# Patient Record
Sex: Female | Born: 1935
Health system: Southern US, Community
[De-identification: ages and names within clinical notes are randomized; demographics above are authoritative.]

## PROBLEM LIST (undated history)

## (undated) DIAGNOSIS — M810 Age-related osteoporosis without current pathological fracture: Secondary | ICD-10-CM

## (undated) DIAGNOSIS — M1990 Unspecified osteoarthritis, unspecified site: Secondary | ICD-10-CM

## (undated) DIAGNOSIS — F32A Depression, unspecified: Secondary | ICD-10-CM

## (undated) DIAGNOSIS — I5041 Acute combined systolic (congestive) and diastolic (congestive) heart failure: Secondary | ICD-10-CM

## (undated) DIAGNOSIS — S42401A Unspecified fracture of lower end of right humerus, initial encounter for closed fracture: Secondary | ICD-10-CM

## (undated) DIAGNOSIS — I10 Essential (primary) hypertension: Secondary | ICD-10-CM

## (undated) DIAGNOSIS — F419 Anxiety disorder, unspecified: Secondary | ICD-10-CM

## (undated) DIAGNOSIS — J449 Chronic obstructive pulmonary disease, unspecified: Secondary | ICD-10-CM

## (undated) DIAGNOSIS — Z973 Presence of spectacles and contact lenses: Secondary | ICD-10-CM

## (undated) DIAGNOSIS — Z923 Personal history of irradiation: Secondary | ICD-10-CM

## (undated) DIAGNOSIS — E785 Hyperlipidemia, unspecified: Secondary | ICD-10-CM

## (undated) DIAGNOSIS — F329 Major depressive disorder, single episode, unspecified: Secondary | ICD-10-CM

## (undated) DIAGNOSIS — C801 Malignant (primary) neoplasm, unspecified: Secondary | ICD-10-CM

## (undated) DIAGNOSIS — J189 Pneumonia, unspecified organism: Secondary | ICD-10-CM

## (undated) HISTORY — PX: HERNIA REPAIR: SHX51

## (undated) HISTORY — DX: Malignant (primary) neoplasm, unspecified: C80.1

## (undated) HISTORY — PX: ABDOMINAL HYSTERECTOMY: SHX81

## (undated) HISTORY — PX: CHOLECYSTECTOMY: SHX55

## (undated) HISTORY — PX: APPENDECTOMY: SHX54

## (undated) HISTORY — DX: Hyperlipidemia, unspecified: E78.5

## (undated) HISTORY — PX: OTHER SURGICAL HISTORY: SHX169

## (undated) HISTORY — DX: Chronic obstructive pulmonary disease, unspecified: J44.9

## (undated) HISTORY — DX: Age-related osteoporosis without current pathological fracture: M81.0

## (undated) HISTORY — PX: JOINT REPLACEMENT: SHX530

## (undated) HISTORY — PX: EYE SURGERY: SHX253

---

## 1998-04-09 ENCOUNTER — Other Ambulatory Visit: Admission: RE | Admit: 1998-04-09 | Discharge: 1998-04-09 | Payer: Self-pay | Admitting: Obstetrics and Gynecology

## 2000-04-12 ENCOUNTER — Other Ambulatory Visit: Admission: RE | Admit: 2000-04-12 | Discharge: 2000-04-12 | Payer: Self-pay | Admitting: Obstetrics and Gynecology

## 2002-02-24 ENCOUNTER — Encounter: Payer: Self-pay | Admitting: Obstetrics and Gynecology

## 2002-02-24 ENCOUNTER — Encounter: Admission: RE | Admit: 2002-02-24 | Discharge: 2002-02-24 | Payer: Self-pay | Admitting: Obstetrics and Gynecology

## 2003-01-27 ENCOUNTER — Ambulatory Visit (HOSPITAL_COMMUNITY): Admission: RE | Admit: 2003-01-27 | Discharge: 2003-01-27 | Payer: Self-pay | Admitting: Gastroenterology

## 2003-07-30 ENCOUNTER — Ambulatory Visit (HOSPITAL_COMMUNITY): Admission: RE | Admit: 2003-07-30 | Discharge: 2003-07-30 | Payer: Self-pay | Admitting: Family Medicine

## 2003-07-30 ENCOUNTER — Encounter: Payer: Self-pay | Admitting: Family Medicine

## 2004-09-21 ENCOUNTER — Ambulatory Visit (HOSPITAL_COMMUNITY): Admission: RE | Admit: 2004-09-21 | Discharge: 2004-09-21 | Payer: Self-pay | Admitting: Family Medicine

## 2005-10-02 ENCOUNTER — Ambulatory Visit (HOSPITAL_COMMUNITY): Admission: RE | Admit: 2005-10-02 | Discharge: 2005-10-02 | Payer: Self-pay | Admitting: Family Medicine

## 2006-10-18 ENCOUNTER — Ambulatory Visit (HOSPITAL_COMMUNITY): Admission: RE | Admit: 2006-10-18 | Discharge: 2006-10-18 | Payer: Self-pay | Admitting: Family Medicine

## 2007-10-25 ENCOUNTER — Ambulatory Visit (HOSPITAL_COMMUNITY): Admission: RE | Admit: 2007-10-25 | Discharge: 2007-10-25 | Payer: Self-pay | Admitting: Family Medicine

## 2008-11-12 ENCOUNTER — Ambulatory Visit (HOSPITAL_COMMUNITY): Admission: RE | Admit: 2008-11-12 | Discharge: 2008-11-12 | Payer: Self-pay | Admitting: Family Medicine

## 2009-11-23 ENCOUNTER — Ambulatory Visit (HOSPITAL_COMMUNITY): Admission: RE | Admit: 2009-11-23 | Discharge: 2009-11-23 | Payer: Self-pay | Admitting: Family Medicine

## 2010-07-12 ENCOUNTER — Ambulatory Visit (HOSPITAL_COMMUNITY): Admission: RE | Admit: 2010-07-12 | Discharge: 2010-07-12 | Payer: Self-pay | Admitting: Family Medicine

## 2010-11-21 ENCOUNTER — Other Ambulatory Visit (HOSPITAL_COMMUNITY): Payer: Self-pay | Admitting: Family Medicine

## 2010-11-21 DIAGNOSIS — Z139 Encounter for screening, unspecified: Secondary | ICD-10-CM

## 2010-11-24 ENCOUNTER — Ambulatory Visit (HOSPITAL_COMMUNITY)
Admission: RE | Admit: 2010-11-24 | Discharge: 2010-11-24 | Disposition: A | Discharge status: Home / Self Care | Payer: MEDICARE | Source: Ambulatory Visit | Attending: Family Medicine | Admitting: Family Medicine

## 2010-11-24 DIAGNOSIS — Z1231 Encounter for screening mammogram for malignant neoplasm of breast: Secondary | ICD-10-CM | POA: Insufficient documentation

## 2010-11-24 DIAGNOSIS — Z139 Encounter for screening, unspecified: Secondary | ICD-10-CM

## 2011-01-08 ENCOUNTER — Emergency Department (INDEPENDENT_AMBULATORY_CARE_PROVIDER_SITE_OTHER): Payer: MEDICARE

## 2011-01-08 ENCOUNTER — Emergency Department (HOSPITAL_BASED_OUTPATIENT_CLINIC_OR_DEPARTMENT_OTHER)
Admission: EM | Admit: 2011-01-08 | Discharge: 2011-01-08 | Disposition: A | Discharge status: Home / Self Care | Payer: MEDICARE | Attending: Emergency Medicine | Admitting: Emergency Medicine

## 2011-01-08 DIAGNOSIS — S42493A Other displaced fracture of lower end of unspecified humerus, initial encounter for closed fracture: Secondary | ICD-10-CM

## 2011-01-08 DIAGNOSIS — W19XXXA Unspecified fall, initial encounter: Secondary | ICD-10-CM | POA: Insufficient documentation

## 2011-01-08 DIAGNOSIS — S42453A Displaced fracture of lateral condyle of unspecified humerus, initial encounter for closed fracture: Secondary | ICD-10-CM

## 2011-01-08 DIAGNOSIS — S42463A Displaced fracture of medial condyle of unspecified humerus, initial encounter for closed fracture: Secondary | ICD-10-CM

## 2011-01-08 DIAGNOSIS — W010XXA Fall on same level from slipping, tripping and stumbling without subsequent striking against object, initial encounter: Secondary | ICD-10-CM

## 2011-01-11 ENCOUNTER — Encounter (HOSPITAL_COMMUNITY)
Admission: RE | Admit: 2011-01-11 | Discharge: 2011-01-11 | Disposition: A | Discharge status: Home / Self Care | Payer: Medicare Other | Source: Ambulatory Visit | Attending: Orthopedic Surgery | Admitting: Orthopedic Surgery

## 2011-01-11 ENCOUNTER — Other Ambulatory Visit (HOSPITAL_COMMUNITY): Payer: Self-pay | Admitting: Orthopedic Surgery

## 2011-01-11 ENCOUNTER — Ambulatory Visit (HOSPITAL_COMMUNITY)
Admission: RE | Admit: 2011-01-11 | Discharge: 2011-01-11 | Disposition: A | Discharge status: Home / Self Care | Payer: Medicare Other | Source: Ambulatory Visit | Attending: Orthopedic Surgery | Admitting: Orthopedic Surgery

## 2011-01-11 DIAGNOSIS — Z01812 Encounter for preprocedural laboratory examination: Secondary | ICD-10-CM | POA: Insufficient documentation

## 2011-01-11 DIAGNOSIS — Z01818 Encounter for other preprocedural examination: Secondary | ICD-10-CM | POA: Insufficient documentation

## 2011-01-11 DIAGNOSIS — Z01810 Encounter for preprocedural cardiovascular examination: Secondary | ICD-10-CM | POA: Insufficient documentation

## 2011-01-11 LAB — DIFFERENTIAL
Basophils Relative: 0 % (ref 0–1)
Eosinophils Absolute: 0.2 10*3/uL (ref 0.0–0.7)
Lymphs Abs: 1.9 10*3/uL (ref 0.7–4.0)
Neutrophils Relative %: 68 % (ref 43–77)

## 2011-01-11 LAB — PROTIME-INR: Prothrombin Time: 13 seconds (ref 11.6–15.2)

## 2011-01-11 LAB — COMPREHENSIVE METABOLIC PANEL
Albumin: 3.2 g/dL — ABNORMAL LOW (ref 3.5–5.2)
BUN: 17 mg/dL (ref 6–23)
Creatinine, Ser: 0.74 mg/dL (ref 0.4–1.2)
Total Bilirubin: 0.8 mg/dL (ref 0.3–1.2)
Total Protein: 5.9 g/dL — ABNORMAL LOW (ref 6.0–8.3)

## 2011-01-11 LAB — SURGICAL PCR SCREEN: MRSA, PCR: NEGATIVE

## 2011-01-11 LAB — URINALYSIS, ROUTINE W REFLEX MICROSCOPIC
Ketones, ur: 15 mg/dL — AB
Nitrite: NEGATIVE
Protein, ur: NEGATIVE mg/dL

## 2011-01-11 LAB — ABO/RH: ABO/RH(D): O POS

## 2011-01-11 LAB — CBC
Platelets: 192 10*3/uL (ref 150–400)
RBC: 4.6 MIL/uL (ref 3.87–5.11)
WBC: 9.7 10*3/uL (ref 4.0–10.5)

## 2011-01-11 LAB — APTT: aPTT: 29 seconds (ref 24–37)

## 2011-01-11 LAB — TYPE AND SCREEN: Antibody Screen: NEGATIVE

## 2011-01-12 LAB — URINE CULTURE: Colony Count: NO GROWTH

## 2011-01-13 ENCOUNTER — Inpatient Hospital Stay (HOSPITAL_COMMUNITY): Payer: Medicare Other

## 2011-01-13 ENCOUNTER — Inpatient Hospital Stay (HOSPITAL_COMMUNITY)
Admission: RE | Admit: 2011-01-13 | Discharge: 2011-01-14 | DRG: 494 | Disposition: A | Discharge status: Home / Self Care | Payer: Medicare Other | Source: Ambulatory Visit | Attending: Orthopedic Surgery | Admitting: Orthopedic Surgery

## 2011-01-13 DIAGNOSIS — W19XXXA Unspecified fall, initial encounter: Secondary | ICD-10-CM | POA: Diagnosis present

## 2011-01-13 DIAGNOSIS — Z7982 Long term (current) use of aspirin: Secondary | ICD-10-CM

## 2011-01-13 DIAGNOSIS — F411 Generalized anxiety disorder: Secondary | ICD-10-CM | POA: Diagnosis present

## 2011-01-13 DIAGNOSIS — E785 Hyperlipidemia, unspecified: Secondary | ICD-10-CM | POA: Diagnosis present

## 2011-01-13 DIAGNOSIS — S42413A Displaced simple supracondylar fracture without intercondylar fracture of unspecified humerus, initial encounter for closed fracture: Principal | ICD-10-CM | POA: Diagnosis present

## 2011-01-13 DIAGNOSIS — Z96649 Presence of unspecified artificial hip joint: Secondary | ICD-10-CM

## 2011-01-13 DIAGNOSIS — Z79899 Other long term (current) drug therapy: Secondary | ICD-10-CM

## 2011-01-13 DIAGNOSIS — F17200 Nicotine dependence, unspecified, uncomplicated: Secondary | ICD-10-CM | POA: Diagnosis present

## 2011-01-13 DIAGNOSIS — I10 Essential (primary) hypertension: Secondary | ICD-10-CM | POA: Diagnosis present

## 2011-01-13 DIAGNOSIS — M1990 Unspecified osteoarthritis, unspecified site: Secondary | ICD-10-CM | POA: Diagnosis present

## 2011-01-13 DIAGNOSIS — M810 Age-related osteoporosis without current pathological fracture: Secondary | ICD-10-CM | POA: Diagnosis present

## 2011-01-14 LAB — CBC
MCV: 96.4 fL (ref 78.0–100.0)
Platelets: 188 10*3/uL (ref 150–400)
RBC: 4.2 MIL/uL (ref 3.87–5.11)
WBC: 9.3 10*3/uL (ref 4.0–10.5)

## 2011-01-14 LAB — BASIC METABOLIC PANEL
BUN: 7 mg/dL (ref 6–23)
CO2: 31 mEq/L (ref 19–32)
Calcium: 9.2 mg/dL (ref 8.4–10.5)
Chloride: 106 mEq/L (ref 96–112)
Creatinine, Ser: 0.57 mg/dL (ref 0.4–1.2)
GFR calc Af Amer: >60 mL/min (ref >60)
GFR calc non Af Amer: >60 mL/min (ref >60)
Glucose, Bld: 124 mg/dL — ABNORMAL HIGH (ref 70–99)
Potassium: 3.6 mEq/L (ref 3.5–5.1)
Sodium: 143 mEq/L (ref 135–145)

## 2011-01-14 NOTE — Op Note (Signed)
NAMEDAYLIN, [name]                ACCOUNT NO.:  [id number]  MEDICAL RECORD NO.:  [id number]           PATIENT TYPE:  I  LOCATION:  5021                         FACILITY:  [facility]  PHYSICIAN:  Doralee Albino. Carola Frost, M.D. DATE OF BIRTH:  Jul 04, 1934  DATE OF PROCEDURE:  01/13/2011 DATE OF DISCHARGE:                              OPERATIVE REPORT   PREOPERATIVE DIAGNOSIS:  Right intercondylar and supracondylar distal humerus fracture.  POSTOPERATIVE DIAGNOSIS:  Right intercondylar and supracondylar distal humerus fracture.  PROCEDURE: 1. Open reduction and internal fixation of right distal humerus     intercondylar and supracondylar fracture. 2. Anterior subcutaneous transposition of the ulnar nerve.  SURGEON:  Doralee Albino. Carola Frost, MD  ASSISTANT:  Mearl Latin, PA  ANESTHESIA:  General.  COMPLICATIONS:  None.  TOTAL TOURNIQUET TIME:  1 hour and 14 minutes.  DISPOSITION:  To PACU.  CONDITION:  Stable.  BRIEF SUMMARY OF INDICATION FOR PROCEDURE:  [name] is a very pleasant 77 year old right-hand dominant female who sustained a distal humerus fracture and a fall.  I discussed with her the risks and benefits of surgery including possibility of infection, nerve injury, vessel injury, DVT, PE, heart attack, stroke, loss of motion, arthritis, need for further surgery, and possible need for total elbow arthroplasty in the event that there was severe osteopenia or inability to maintain reduction and fixation.  She understood these risks and benefits and did wish to proceed with surgical treatment.  BRIEF DESCRIPTION OF PROCEDURE:  [name] was administered preoperative antibiotics, taken to the operating room where general anesthesia was induced.  She was positioned right side up with all prominences padded appropriately.  After standard prep and drape, the arm was elevated, exsanguinated with an Esmarch bandage, tourniquet inflated to 250 mmHg, and then a posterior incision  made.  Because of the fracture pattern, I did feel that I would be able to dial in the reduction without a formal olecranon osteotomy.  Consequently, I elevated the triceps on both the medial and lateral side after identifying the ulnar nerve and beginning a neurolysis.  The small branches were cauterized with bipolar.  The ulnar nerve was protected with a fourth-inch Penrose drain and used to gently retract the nerve. The fracture site was identified and cleaned with curette and lavage.  A reduction performed under direct visualization, held provisionally with a K-wire and tenaculum and this was followed by application of the plates medially and laterally using first a standard screw and then additional standard fixation with locked fixation in the distal segment. I did check the plate position initially with AP and lateral images prior to placing the rest of my hardware.  Final images showed appropriate reduction and hardware length and trajectory with no intra- articular penetration.  The arm could also easily be taken through full range of motion.  However, the ulnar nerve was placed under the significant stress because of the plate position and with closure of a soft tissue envelope over this plate to avoid direct contact with the nerve, the traction to the ulnar nerve was simply too great. Consequently, the intermuscular septum or fascial  layer was released along the medial side and a sling fashioned with subcutaneous fat and secured with two 2-0 Prolene sutures to isolate the ulnar nerve anteriorly and anterior to the epicondyle to prevent any contact with the plate of course.  It was quite relaxed in this position with no stretch either in flexion or extension.  Prior to performing this last procedure, I did deflate the tourniquet and obtained hemostasis with electrocautery using the bipolar.  The triceps was repaired back with #1 Vicryl, 2-0 Vicryl for the subcu, and staples for  the skin, then a sterile gently compressive dressing.  The patient was awakened from anesthesia and transferred to PACU in stable condition.  Montez Morita, PA- C assisted me during the procedure and was necessary for safe and effective completion of the case as he was required to protect the ulnar nerve medially, assist with maintenance of provisional fixation and placement of definitive fixation, and also to protect the radial nerve laterally.  Furthermore, he assisted me in simultaneous wound closure to expedite the case.  PROGNOSIS:  [name] appeared to have an outstanding reduction with full range of motion on the table and a good stability of the fracture subsequent to fixation.  We anticipate beginning her range of motion within the next 10 days, allowing some time for her soft tissue envelope to heal.  She remains at increased risk for loss of motion and will work with physical therapy.     Doralee Albino. Carola Frost, M.D.     MHH/MEDQ  D:  01/13/2011  T:  01/14/2011  Job:  [id number]  Electronically Signed by Myrene Galas M.D. on 01/14/2011 06:08:15 PM

## 2011-02-24 NOTE — Discharge Summary (Signed)
[name], [name]                ACCOUNT NO.:  [id number]  MEDICAL RECORD NO.:  [id number]           PATIENT TYPE:  I  LOCATION:  5021                         FACILITY:  [facility]  PHYSICIAN:  Doralee Albino. Carola Frost, M.D. DATE OF BIRTH:  [birth year]  DATE OF ADMISSION:  01/13/2011 DATE OF DISCHARGE:  01/14/2011                              DISCHARGE SUMMARY   DISCHARGE DIAGNOSIS:  Right distal humerus fracture status post open reduction and internal fixation.  ADDITIONAL DISCHARGE DIAGNOSES: 1. Hypertension. 2. Hyperlipidemia. 3. Anxiety. 4. Osteoporosis. 5. Osteoarthritis. 6. Cataract. 7. Status post bilateral total hip arthroplasties. 8. History of cholecystectomy. 9. History of hysterectomy. 10.History of carpal tunnel release.  PROCEDURES PERFORMED:  On January 13, 2011, ORIF right distal humerus fracture intercondylar and supracondylar fracture as well as anterior subcutaneous transposition of right ulnar nerve. BRIEF HISTORY AND HOSPITAL COURSE:  [name] is a 77 year old right- hand-dominant female who fell, ground-level fall, sustaining a fracture to her right distal humerus.  The patient was seen in our office and scheduled for surgery.  She underwent the procedure described above on January 13, 2011.  She was admitted overnight as an observation for pain control as well as to begin occupational therapy.  She remained stable overnight and was stable on postoperative day #1 and was deemed stable for discharge to home.  No acute findings were noted on her postop day #1 exam and the patient was eager to discharge to home.  DISCHARGE MEDICATIONS: 1. Percocet 5/325 one to two p.o. q.6 h. for pain. 2. Vitamin D2, 50,000 units 1 p.o. weekly. 3. Alprazolam 0.5 mg 1 p.o. b.i.d. as needed. 4. Vitamin B complex over the counter. 5. Aspirin 81 mg 1 p.o. daily. 6. Effexor 75 mg 1 p.o. daily. 7. Lovastatin 40 mg 1 p.o. daily at bedtime. 8. Benicar 20 mg 1 p.o. daily in the  morning. 9. Hydrochlorothiazide 25 mg half tablet p.o. daily.  The patient will     stop Aleve and will also stop her Boniva.  DISCHARGE INSTRUCTIONS AND PLANS:  [name] did sustain a fairly significant injury to her dominant side.  We were able to achieve excellent fixation and reduction and restore stability and alignment to her fracture.  We do anticipate that she will heal uneventfully, however, there is some concern given her osteoporosis and concomitant bisphosphonate use.  Once she is here, we may review her bisphosphonate use as well as obtain additional bone labs to evaluate for any additional secondary causes of osteoporosis.  She does smoke as well which also contributes to her decreased bone healing capacity as well as her overall healing capacity.  The patient was placed in a posterior long-arm splint and will be removed from this within 10 days or so and beginning some gentle range of motion of her elbow.  We did not need to perform an olecranon osteotomy.  Therefore, she will have really unrestricted range of motion with active-active assisted and passive motion for flexion and extension.  [name] does not require any DVT prophylaxis as this was an upper extremity injury and I anticipate her being fairly  mobile upon discharge to home.  We will see the patient back in 10-14 days for evaluation, followup x-rays, removal of splint and sutures, and commencement of range of motion activities.  Given the fact that this is an intra-articular injury to her elbow, it would not be surprising if she does have some loss of terminal extension as well as flexion.     Mearl Latin, PA   ______________________________ Doralee Albino. Carola Frost, M.D.    KWP/MEDQ  D:  02/09/2011  T:  02/09/2011  Job:  [id number]  Electronically Signed by Montez Morita PA on 02/17/2011 10:06:17 AM Electronically Signed by Myrene Galas M.D. on 02/24/2011 07:42:27 AM

## 2011-03-10 NOTE — Op Note (Signed)
   NAME:  [name], [name]                          ACCOUNT NO.:  [id number]   MEDICAL RECORD NO.:  [id number]                   PATIENT TYPE:  AMB   LOCATION:  ENDO                                 FACILITY:  [facility]   PHYSICIAN:  James L. Malon Kindle., M.D.          DATE OF BIRTH:  Sep 21, 1934   DATE OF PROCEDURE:  01/27/2003  DATE OF DISCHARGE:                                 OPERATIVE REPORT   PROCEDURE:  Colonoscopy.   MEDICATIONS:  Fentanyl 100 mcg, Versed 8 mg IV.   SCOPE:  Olympus pediatric colonoscope.   INDICATIONS FOR PROCEDURE:  A 77 year old with a strong family history of  colon cancer, a sister who died of metastatic colon cancer. She had some  hemorrhoids on colonoscopy five years ago. This is done as a five year  followup.   DESCRIPTION OF PROCEDURE:  The procedure had been explained to the patient  and consent obtained. With the patient in the left lateral decubitus  position, the Olympus scope was inserted and advanced. An adjustable  pediatric scope was used. We were able to reach the cecum using abdominal  pressure and position changes. She had a long tortuous colon and several  maneuvers were required but we were eventually able to successfully reach  it. The ileocecal valve and appendiceal orifice were seen. The scope was  withdrawn and the cecum, ascending colon, transverse colon, descending and  sigmoid colon were seen well, no polyps seen throughout. No significant  diverticular disease.  The scope was withdrawn. No lesions were seen other  than small internal hemorrhoids in the anal canal. The patient tolerated the  procedure well and was maintained on low flow oxygen and pulse oximeter  throughout the procedure.   ASSESSMENT:  No evidence of polyps in this high risk individual.   PLAN:  Will recommend repeating her colonoscopy in five years and will  recommend yearly Hemoccults with colonoscopy to be done sooner if stools are  positive.                                     James L. Malon Kindle., M.D.    Waldron Session  D:  01/27/2003  T:  01/27/2003  Job:  [id number]   cc:   Caryn Bee L. Little, M.D.  [address]  [place]  [place] [address]  Fax: [phone]

## 2011-10-24 DIAGNOSIS — S42401A Unspecified fracture of lower end of right humerus, initial encounter for closed fracture: Secondary | ICD-10-CM

## 2011-10-24 HISTORY — DX: Unspecified fracture of lower end of right humerus, initial encounter for closed fracture: S42.401A

## 2011-10-24 HISTORY — PX: ELBOW FRACTURE SURGERY: SHX616

## 2011-12-04 ENCOUNTER — Encounter (HOSPITAL_COMMUNITY): Payer: Self-pay | Admitting: Pharmacy Technician

## 2011-12-06 ENCOUNTER — Encounter (HOSPITAL_COMMUNITY)
Admission: RE | Admit: 2011-12-06 | Discharge: 2011-12-06 | Disposition: A | Discharge status: Home / Self Care | Payer: Medicare Other | Source: Ambulatory Visit | Attending: Orthopedic Surgery | Admitting: Orthopedic Surgery

## 2011-12-06 ENCOUNTER — Encounter (HOSPITAL_COMMUNITY): Payer: Self-pay

## 2011-12-06 HISTORY — DX: Unspecified osteoarthritis, unspecified site: M19.90

## 2011-12-06 HISTORY — DX: Depression, unspecified: F32.A

## 2011-12-06 HISTORY — DX: Major depressive disorder, single episode, unspecified: F32.9

## 2011-12-06 HISTORY — DX: Essential (primary) hypertension: I10

## 2011-12-06 HISTORY — DX: Pneumonia, unspecified organism: J18.9

## 2011-12-06 LAB — COMPREHENSIVE METABOLIC PANEL
ALT: 15 U/L (ref 0–35)
AST: 18 U/L (ref 0–37)
Albumin: 3.6 g/dL (ref 3.5–5.2)
Alkaline Phosphatase: 66 U/L (ref 39–117)
BUN: 16 mg/dL (ref 6–23)
CO2: 26 mEq/L (ref 19–32)
Calcium: 10.7 mg/dL — ABNORMAL HIGH (ref 8.4–10.5)
Chloride: 101 mEq/L (ref 96–112)
Creatinine, Ser: 0.62 mg/dL (ref 0.50–1.10)
GFR calc Af Amer: >90 mL/min (ref >90)
GFR calc non Af Amer: 86 mL/min — ABNORMAL LOW (ref >90)
Glucose, Bld: 95 mg/dL (ref 70–99)
Potassium: 3.3 mEq/L — ABNORMAL LOW (ref 3.5–5.1)
Sodium: 141 mEq/L (ref 135–145)
Total Bilirubin: 0.3 mg/dL (ref 0.3–1.2)
Total Protein: 7.5 g/dL (ref 6.0–8.3)

## 2011-12-06 LAB — PROTIME-INR
INR: 0.94 (ref 0.00–1.49)
Prothrombin Time: 12.8 seconds (ref 11.6–15.2)

## 2011-12-06 LAB — DIFFERENTIAL
Basophils Absolute: 0 10*3/uL (ref 0.0–0.1)
Basophils Relative: 0 % (ref 0–1)
Eosinophils Absolute: 0 10*3/uL (ref 0.0–0.7)
Eosinophils Relative: 0 % (ref 0–5)
Lymphocytes Relative: 11 % — ABNORMAL LOW (ref 12–46)
Lymphs Abs: 2.1 10*3/uL (ref 0.7–4.0)
Monocytes Absolute: 1.9 10*3/uL — ABNORMAL HIGH (ref 0.1–1.0)
Monocytes Relative: 10 % (ref 3–12)
Neutro Abs: 15.2 10*3/uL — ABNORMAL HIGH (ref 1.7–7.7)
Neutrophils Relative %: 79 % — ABNORMAL HIGH (ref 43–77)

## 2011-12-06 LAB — URINALYSIS, ROUTINE W REFLEX MICROSCOPIC
Bilirubin Urine: NEGATIVE
Glucose, UA: NEGATIVE mg/dL
Hgb urine dipstick: NEGATIVE
Ketones, ur: NEGATIVE mg/dL
Leukocytes, UA: NEGATIVE
Nitrite: NEGATIVE
Protein, ur: NEGATIVE mg/dL
Specific Gravity, Urine: 1.013 (ref 1.005–1.030)
Urobilinogen, UA: 0.2 mg/dL (ref 0.0–1.0)
pH: 6.5 (ref 5.0–8.0)

## 2011-12-06 LAB — CBC
HCT: 47.1 % — ABNORMAL HIGH (ref 36.0–46.0)
Hemoglobin: 15.7 g/dL — ABNORMAL HIGH (ref 12.0–15.0)
MCH: 31.6 pg (ref 26.0–34.0)
MCHC: 33.3 g/dL (ref 30.0–36.0)
MCV: 94.8 fL (ref 78.0–100.0)
Platelets: 224 10*3/uL (ref 150–400)
RBC: 4.97 MIL/uL (ref 3.87–5.11)
RDW: 14.5 % (ref 11.5–15.5)
WBC: 19.2 10*3/uL — ABNORMAL HIGH (ref 4.0–10.5)

## 2011-12-06 LAB — APTT: aPTT: 29 seconds (ref 24–37)

## 2011-12-06 LAB — SURGICAL PCR SCREEN: Staphylococcus aureus: POSITIVE — AB

## 2011-12-06 NOTE — Pre-Procedure Instructions (Signed)
12/06/11 Reported abnormal lab results of K-3.3 and white count of 19.2 to Dr Victorino Dike in office of Dr Darrelyn Hillock.  He stated he would let Dr Darrelyn Hillock be aware.

## 2011-12-06 NOTE — Patient Instructions (Signed)
22 [name]  12/06/2011   Your procedure is scheduled on:  12/13/11 0830am-1100am  Report to [facility] at 0630 AM.  Call this number if you have problems the morning of surgery: [phone]   Remember:   Do not eat food:After Midnight.  May have clear liquids:until Midnight .    Take these medicines the morning of surgery with A SIP OF WATER:    Do not wear jewelry, make-up or nail polish.  Do not wear lotions, powders, or perfumes.  Do not shave 48 hours prior to surgery.  Do not bring valuables to the hospital.  Contacts, dentures or bridgework may not be worn into surgery.  Leave suitcase in the car. After surgery it may be brought to your room.  For patients admitted to the hospital, checkout time is 11:00 AM the day of discharge.     Special Instructions: CHG Shower Use Special Wash: 1/2 bottle night before surgery and 1/2 bottle morning of surgery.   Please read over the following fact sheets that you were given: MRSA Information, Incentive Spirometry Fact Sheet, Blood Transfusion Fact Sheet, coughing and deep breathing exercises, leg exercises

## 2011-12-07 NOTE — H&P (Signed)
[name] DOB: 1934/10/17   Chief Complaint: Right knee pain  History of Present Illness The patient is a 78 year old female who comes in today for a preoperative History and Physical. The patient is scheduled for a right total knee arthroplasty to be performed by Dr. Georges Lynch. Denise Hillock, MD at [facility] on Wednesday December 13, 2011 . The patient has been followed for their right knee pain. They are now 10 months out from injury. Symptoms reported today include pain and instability. The patient feels that they are doing poorly. She had knee arthroscopy done 06/11/2009. Her knee has been getting progressively worse since a fall in March 2012. PCP: Dr. Catha Gosselin  Past MedicalHistory Degeneration, lumbar/lumbosacral disc (722.52). 11/20/2000 Syndrome, carpal tunnel (354.0). 08/21/2002 Sprain/strain, shoulder/arm NEC (840.8). 02/27/2006 Osteoarthrosis NOS, lower leg (715.96). 01/28/2009 Elbow fracture, right (812.40) Osteoporosis Chronic Obstructive Lung Disease Oophorectomy. bilateral Macular Degeneration Depression Cataract Shingles Pneumonia Hypertension Hypercholesterolemia Gallbladder Problems Measles. childhood Mumps. childhood  Allergies No Known Drug Allergies. 11/06/2011  Family History Father. Deceased, Congestive Heart Failure. 21 Mother. Living, In good health. Sister 1. Deceased. 58 colon cancer  Social History Drug/Alcohol Rehab (Currently). no Drug/Alcohol Rehab (Previously). no Exercise. Exercises daily; does running / walking Alcohol use. never consumed alcohol Children. 4 Current work status. retired Illicit drug use. no Tobacco / smoke exposure. yes outdoors only Tobacco use. current every day smoker; smoke(d) 3/4 pack(s) per day Living situation. live alone Marital status. widowed Pain Contract. no Caregiver. daughter Advance Directives. living will, Healthcare POA  Medication History Calcium  600 ( Oral daily) Specific dose unknown - Active. Vitamin D (1000UNIT Capsule, Oral two times daily) Active. Effexor XR (75MG  Capsule ER 24HR, Oral daily) Active. Lipitor (40MG  Tablet, Oral daily) Active. Benicar (20MG  Tablet, Oral daily) Active. Hydrochlorothiazide (12.5MG  Capsule, Oral daily) Active. Aspirin EC (81MG  Tablet DR, Oral daily) Active. Multivitamins ( Oral daily) Active. PreserVision AREDS ( Oral two times daily) Active.  Past Surgical History Colon Polyp Removal - Colonoscopy Gallbladder Surgery. open 1980 Hysterectomy. complete (non-cancerous) 1998 Arthroscopy of Knee. right 2010 Carpal Tunnel Repair. right 2003 Cataract Surgery. left 2008 Other Orthopaedic Surgery. elbow 2012 Total Hip Replacement. bilateral 1992, 1993 Inguinal Hernia Repair-Left. 1992 Oophorectomy; Unilateral. 1958 left  Review of Systems General:Not Present- Chills, Fever, Night Sweats, Fatigue, Weight Gain, Weight Loss and Memory Loss. Skin:Not Present- Hives, Itching, Rash, Eczema and Lesions. HEENT:Not Present- Tinnitus, Headache, Double Vision, Visual Loss, Hearing Loss and Dentures. Respiratory:Present- Chronic Cough. Not Present- Shortness of breath with exertion, Shortness of breath at rest, Allergies and Coughing up blood. Cardiovascular:Not Present- Chest Pain, Racing/skipping heartbeats, Difficulty Breathing Lying Down, Murmur, Swelling and Palpitations. Gastrointestinal:Not Present- Bloody Stool, Heartburn, Abdominal Pain, Vomiting, Nausea, Constipation, Diarrhea, Difficulty Swallowing, Jaundice and Loss of appetitie. Female Genitourinary:Present- Urinary frequency and Incontinence. Not Present- Blood in Urine, Weak urinary stream, Discharge, Flank Pain, Painful Urination, Urgency, Urinary Retention and Urinating at Night. Musculoskeletal:Present- Joint Swelling and Joint Pain. Not Present- Muscle Weakness, Muscle Pain, Back Pain, Morning Stiffness and  Spasms. Neurological:Not Present- Tremor, Dizziness, Blackout spells, Paralysis, Difficulty with balance and Weakness. Psychiatric:Not Present- Insomnia.   Vitals 12/05/2011 10:36 AM Pulse: 75 (Regular) Resp.: 18 (Unlabored) BP: 126/70 (Sitting, Left Arm, Standard)  12/05/2011 10:18 AM Weight: 143 lb Height: 60 in Body Surface Area: 1.66 m Body Mass Index: 27.93 kg/m  Physical Exam General Mental Status - Alert, cooperative and good historian. General Appearance- pleasant. Not in acute distress. Orientation- Oriented X3. Build & Nutrition- Well  nourished and Well developed. Head and Neck Head- normocephalic, atraumatic . NeckGlobal Assessment- supple. no bruit auscultated on the right and no bruit auscultated on the left. Eye Lens- Right- Cataract. Pupil- Bilateral- PERRLA. Motion- Bilateral- EOMI. Chest and Lung Exam Auscultation: Breath sounds:- clear at anterior chest wall and - clear at posterior chest wall. Adventitious sounds:End inspiratory coarse crackles- Both Lung Fields. Cardiovascular Auscultation:Rhythm- Regular rate and rhythm. Heart Sounds- S1 WNL and S2 WNL. Murmurs & Other Heart Sounds:Auscultation of the heart reveals - No Murmurs. Abdomen Palpation/Percussion:Tenderness- Abdomen is non-tender to palpation. Rigidity (guarding)- Abdomen is soft. Auscultation:Auscultation of the abdomen reveals - Bowel sounds normal. Female Genitourinary Not done, not pertinent to present illness Peripheral Vascular Upper Extremity: Palpation:- Pulses bilaterally normal. Lower Extremity: Palpation:- Pulses bilaterally normal. Neurologic Examination of related systems reveals - normal muscle strength and tone in all extremities. Neurologic evaluation reveals - normal sensation and upper and lower extremity deep tendon reflexes intact bilaterally . Musculoskeletal Examination of right knee shows a good motion of her knee  with mld pain with ROM. Moderate crepitus. Nontender to palpation. Mild soft tissue swelling. Valgus deformity. Ligaments stable. Left knee has no pain with motion. No tenderness. Mild crepitus. Ligaments stable. No swelling or effusion.   X-rays show bone on bone changes in the lateral and patellofemoral compartments of the right knee.  Assessment & Plan Osteoarthritis, Knee (715.96) Right total knee arthroplasty There is always the possibility of developing an early or late infection in the knee which is extremely rare. We do try to protect you with antibiotics prior to the surgery. Also second complication that is rare and it could occur are blood clots in the leg so we do require in people that are not allergic to aspirin to use two aspirin a day the day immediately after surgery, begin those immediately after surgery and then two each day, one breakfast, one supper for two weeks as an anticoagulant to prevent any blood clots. In total knee replacements we do use what we term PAS hose. These are pressure hose that are put on the legs in the recovery room to try to prevent blood clots and we also use blood thinners to try to prevent blood clots for total knees.   Dimitri Ped, PA-C

## 2011-12-12 ENCOUNTER — Other Ambulatory Visit (HOSPITAL_COMMUNITY): Payer: Self-pay | Admitting: Family Medicine

## 2011-12-12 DIAGNOSIS — Z1231 Encounter for screening mammogram for malignant neoplasm of breast: Secondary | ICD-10-CM

## 2011-12-13 ENCOUNTER — Encounter (HOSPITAL_COMMUNITY): Payer: Self-pay | Admitting: *Deleted

## 2011-12-13 ENCOUNTER — Inpatient Hospital Stay (HOSPITAL_COMMUNITY)
Admission: RE | Admit: 2011-12-13 | Discharge: 2011-12-16 | DRG: 470 | Disposition: A | Discharge status: Home Health | Payer: Medicare Other | Source: Ambulatory Visit | Attending: Orthopedic Surgery | Admitting: Orthopedic Surgery

## 2011-12-13 ENCOUNTER — Inpatient Hospital Stay (HOSPITAL_COMMUNITY): Payer: Medicare Other | Admitting: Registered Nurse

## 2011-12-13 ENCOUNTER — Encounter (HOSPITAL_COMMUNITY)
Admission: RE | Disposition: A | Discharge status: Home Health | Payer: Self-pay | Source: Ambulatory Visit | Attending: Orthopedic Surgery

## 2011-12-13 ENCOUNTER — Encounter (HOSPITAL_COMMUNITY): Payer: Self-pay | Admitting: Registered Nurse

## 2011-12-13 ENCOUNTER — Inpatient Hospital Stay (HOSPITAL_COMMUNITY): Payer: Medicare Other

## 2011-12-13 ENCOUNTER — Other Ambulatory Visit: Payer: Self-pay

## 2011-12-13 DIAGNOSIS — Z96659 Presence of unspecified artificial knee joint: Secondary | ICD-10-CM

## 2011-12-13 DIAGNOSIS — Z01812 Encounter for preprocedural laboratory examination: Secondary | ICD-10-CM

## 2011-12-13 DIAGNOSIS — I10 Essential (primary) hypertension: Secondary | ICD-10-CM | POA: Diagnosis present

## 2011-12-13 DIAGNOSIS — R3915 Urgency of urination: Secondary | ICD-10-CM | POA: Diagnosis present

## 2011-12-13 DIAGNOSIS — E876 Hypokalemia: Secondary | ICD-10-CM | POA: Diagnosis not present

## 2011-12-13 DIAGNOSIS — E871 Hypo-osmolality and hyponatremia: Secondary | ICD-10-CM | POA: Diagnosis not present

## 2011-12-13 DIAGNOSIS — M1711 Unilateral primary osteoarthritis, right knee: Secondary | ICD-10-CM | POA: Diagnosis present

## 2011-12-13 DIAGNOSIS — M1710 Unilateral primary osteoarthritis, unspecified knee: Principal | ICD-10-CM | POA: Diagnosis present

## 2011-12-13 DIAGNOSIS — M21069 Valgus deformity, not elsewhere classified, unspecified knee: Secondary | ICD-10-CM | POA: Diagnosis present

## 2011-12-13 HISTORY — PX: TOTAL KNEE ARTHROPLASTY: SHX125

## 2011-12-13 LAB — TYPE AND SCREEN
ABO/RH(D): O POS
Antibody Screen: NEGATIVE

## 2011-12-13 LAB — ABO/RH: ABO/RH(D): O POS

## 2011-12-13 SURGERY — ARTHROPLASTY, KNEE, TOTAL
Anesthesia: General | Site: Knee | Laterality: Right | Wound class: Clean

## 2011-12-13 MED ORDER — CEFAZOLIN SODIUM 1-5 GM-% IV SOLN
1.0000 g | Freq: Four times a day (QID) | INTRAVENOUS | Status: AC
  Administered 2011-12-13 – 2011-12-14 (×3): 1 g via INTRAVENOUS
  Filled 2011-12-13 (×3): qty 50

## 2011-12-13 MED ORDER — HYDROMORPHONE HCL PF 1 MG/ML IJ SOLN
INTRAMUSCULAR | Status: DC | PRN
  Administered 2011-12-13: 0.5 mg via INTRAVENOUS
  Administered 2011-12-13: 1 mg via INTRAVENOUS
  Administered 2011-12-13: 0.5 mg via INTRAVENOUS

## 2011-12-13 MED ORDER — METHOCARBAMOL 100 MG/ML IJ SOLN
500.0000 mg | Freq: Four times a day (QID) | INTRAVENOUS | Status: DC | PRN
  Administered 2011-12-13: 500 mg via INTRAVENOUS
  Filled 2011-12-13: qty 5

## 2011-12-13 MED ORDER — OLMESARTAN MEDOXOMIL 20 MG PO TABS
20.0000 mg | ORAL_TABLET | Freq: Every day | ORAL | Status: DC
  Administered 2011-12-15 – 2011-12-16 (×2): 20 mg via ORAL
  Filled 2011-12-13 (×4): qty 1

## 2011-12-13 MED ORDER — HYDRALAZINE HCL 20 MG/ML IJ SOLN
INTRAMUSCULAR | Status: DC | PRN
  Administered 2011-12-13: 5 mg via INTRAVENOUS

## 2011-12-13 MED ORDER — METHOCARBAMOL 500 MG PO TABS
500.0000 mg | ORAL_TABLET | Freq: Four times a day (QID) | ORAL | Status: DC | PRN
  Administered 2011-12-14 – 2011-12-16 (×4): 500 mg via ORAL
  Filled 2011-12-13 (×4): qty 1

## 2011-12-13 MED ORDER — LIDOCAINE HCL (CARDIAC) 20 MG/ML IV SOLN
INTRAVENOUS | Status: DC | PRN
  Administered 2011-12-13: 60 mg via INTRAVENOUS

## 2011-12-13 MED ORDER — NEOSTIGMINE METHYLSULFATE 1 MG/ML IJ SOLN
INTRAMUSCULAR | Status: DC | PRN
  Administered 2011-12-13: 4 mg via INTRAVENOUS

## 2011-12-13 MED ORDER — METHOCARBAMOL 500 MG PO TABS: 500.0000 mg | ORAL_TABLET | Freq: Four times a day (QID) | ORAL | Status: DC | PRN

## 2011-12-13 MED ORDER — MENTHOL 3 MG MT LOZG: 1.0000 | LOZENGE | OROMUCOSAL | Status: DC | PRN

## 2011-12-13 MED ORDER — PHENOL 1.4 % MT LIQD
1.0000 | OROMUCOSAL | Status: DC | PRN
  Filled 2011-12-13: qty 177

## 2011-12-13 MED ORDER — ACETAMINOPHEN 10 MG/ML IV SOLN
INTRAVENOUS | Status: DC | PRN
  Administered 2011-12-13: 1000 mg via INTRAVENOUS

## 2011-12-13 MED ORDER — LACTATED RINGERS IV SOLN: INTRAVENOUS | Status: DC

## 2011-12-13 MED ORDER — HYDROCODONE-ACETAMINOPHEN 10-325 MG PO TABS
1.0000 | ORAL_TABLET | ORAL | Status: DC | PRN
  Administered 2011-12-13: 1 via ORAL
  Administered 2011-12-13 – 2011-12-14 (×2): 2 via ORAL
  Administered 2011-12-16 (×2): 1 via ORAL
  Filled 2011-12-13 (×2): qty 1
  Filled 2011-12-13: qty 2
  Filled 2011-12-13: qty 1
  Filled 2011-12-13: qty 2

## 2011-12-13 MED ORDER — POLYETHYLENE GLYCOL 3350 17 G PO PACK: 17.0000 g | PACK | Freq: Every day | ORAL | Status: DC | PRN

## 2011-12-13 MED ORDER — MENTHOL 3 MG MT LOZG
1.0000 | LOZENGE | OROMUCOSAL | Status: DC | PRN
  Filled 2011-12-13: qty 9

## 2011-12-13 MED ORDER — ONDANSETRON HCL 4 MG/2ML IJ SOLN: 4.0000 mg | Freq: Four times a day (QID) | INTRAMUSCULAR | Status: DC | PRN

## 2011-12-13 MED ORDER — OXYCODONE-ACETAMINOPHEN 5-325 MG PO TABS
1.0000 | ORAL_TABLET | ORAL | Status: DC | PRN
  Administered 2011-12-14: 1 via ORAL
  Administered 2011-12-14: 2 via ORAL
  Administered 2011-12-14: 1 via ORAL
  Administered 2011-12-14: 2 via ORAL
  Administered 2011-12-15 (×3): 1 via ORAL
  Filled 2011-12-13: qty 1
  Filled 2011-12-13: qty 2
  Filled 2011-12-13 (×2): qty 1
  Filled 2011-12-13: qty 2
  Filled 2011-12-13 (×2): qty 1

## 2011-12-13 MED ORDER — ONDANSETRON HCL 4 MG/2ML IJ SOLN
4.0000 mg | Freq: Four times a day (QID) | INTRAMUSCULAR | Status: DC | PRN
  Administered 2011-12-14: 4 mg via INTRAVENOUS
  Filled 2011-12-13: qty 2

## 2011-12-13 MED ORDER — ALUM & MAG HYDROXIDE-SIMETH 200-200-20 MG/5ML PO SUSP
30.0000 mL | ORAL | Status: DC | PRN
  Filled 2011-12-13: qty 30

## 2011-12-13 MED ORDER — CEFAZOLIN SODIUM 1-5 GM-% IV SOLN: 1.0000 g | Freq: Four times a day (QID) | INTRAVENOUS | Status: DC

## 2011-12-13 MED ORDER — EPHEDRINE SULFATE 50 MG/ML IJ SOLN
INTRAMUSCULAR | Status: DC | PRN
  Administered 2011-12-13: 10 mg via INTRAVENOUS

## 2011-12-13 MED ORDER — CEFAZOLIN SODIUM 1-5 GM-% IV SOLN
1.0000 g | INTRAVENOUS | Status: AC
  Administered 2011-12-13: 1 g via INTRAVENOUS

## 2011-12-13 MED ORDER — ACETAMINOPHEN 650 MG RE SUPP
650.0000 mg | Freq: Four times a day (QID) | RECTAL | Status: DC | PRN
  Filled 2011-12-13: qty 1

## 2011-12-13 MED ORDER — PHENOL 1.4 % MT LIQD: 1.0000 | OROMUCOSAL | Status: DC | PRN

## 2011-12-13 MED ORDER — HYDROMORPHONE HCL PF 1 MG/ML IJ SOLN
0.5000 mg | INTRAMUSCULAR | Status: DC | PRN
  Administered 2011-12-13 – 2011-12-14 (×2): 1 mg via INTRAVENOUS
  Filled 2011-12-13 (×2): qty 1

## 2011-12-13 MED ORDER — FLEET ENEMA 7-19 GM/118ML RE ENEM: 1.0000 | ENEMA | Freq: Once | RECTAL | Status: DC | PRN

## 2011-12-13 MED ORDER — ONDANSETRON HCL 4 MG PO TABS
4.0000 mg | ORAL_TABLET | Freq: Four times a day (QID) | ORAL | Status: DC | PRN
  Administered 2011-12-15: 4 mg via ORAL
  Filled 2011-12-13 (×2): qty 1

## 2011-12-13 MED ORDER — BACITRACIN ZINC 500 UNIT/GM EX OINT
TOPICAL_OINTMENT | CUTANEOUS | Status: DC | PRN
  Administered 2011-12-13: 1 via TOPICAL

## 2011-12-13 MED ORDER — LACTATED RINGERS IV SOLN
INTRAVENOUS | Status: DC
  Administered 2011-12-13 (×2): via INTRAVENOUS

## 2011-12-13 MED ORDER — OXYCODONE-ACETAMINOPHEN 5-325 MG PO TABS: 1.0000 | ORAL_TABLET | ORAL | Status: DC | PRN

## 2011-12-13 MED ORDER — SIMVASTATIN 5 MG PO TABS
5.0000 mg | ORAL_TABLET | Freq: Every day | ORAL | Status: DC
  Administered 2011-12-13 – 2011-12-15 (×3): 5 mg via ORAL
  Filled 2011-12-13 (×5): qty 1

## 2011-12-13 MED ORDER — SODIUM CHLORIDE 0.9 % IR SOLN
Status: DC | PRN
  Administered 2011-12-13 (×2)

## 2011-12-13 MED ORDER — FENTANYL CITRATE 0.05 MG/ML IJ SOLN
INTRAMUSCULAR | Status: DC | PRN
  Administered 2011-12-13 (×3): 50 ug via INTRAVENOUS
  Administered 2011-12-13: 100 ug via INTRAVENOUS

## 2011-12-13 MED ORDER — ONDANSETRON HCL 4 MG PO TABS: 4.0000 mg | ORAL_TABLET | Freq: Four times a day (QID) | ORAL | Status: DC | PRN

## 2011-12-13 MED ORDER — RIVAROXABAN 10 MG PO TABS: 10.0000 mg | ORAL_TABLET | Freq: Every day | ORAL | Status: DC

## 2011-12-13 MED ORDER — BISACODYL 10 MG RE SUPP
10.0000 mg | Freq: Every day | RECTAL | Status: DC | PRN
  Filled 2011-12-13: qty 1

## 2011-12-13 MED ORDER — VENLAFAXINE HCL ER 75 MG PO CP24
75.0000 mg | ORAL_CAPSULE | Freq: Every day | ORAL | Status: DC
  Administered 2011-12-14 – 2011-12-16 (×3): 75 mg via ORAL
  Filled 2011-12-13 (×3): qty 1

## 2011-12-13 MED ORDER — SODIUM CHLORIDE 0.9 % IR SOLN
Status: DC | PRN
  Administered 2011-12-13: 3000 mL

## 2011-12-13 MED ORDER — PROMETHAZINE HCL 25 MG/ML IJ SOLN: 6.2500 mg | INTRAMUSCULAR | Status: DC | PRN

## 2011-12-13 MED ORDER — THROMBIN 5000 UNITS EX SOLR
CUTANEOUS | Status: DC | PRN
  Administered 2011-12-13: 5000 [IU] via TOPICAL

## 2011-12-13 MED ORDER — HYDROCHLOROTHIAZIDE 12.5 MG PO CAPS
12.5000 mg | ORAL_CAPSULE | Freq: Every day | ORAL | Status: DC
  Administered 2011-12-15 – 2011-12-16 (×2): 12.5 mg via ORAL
  Filled 2011-12-13 (×4): qty 1

## 2011-12-13 MED ORDER — ROCURONIUM BROMIDE 100 MG/10ML IV SOLN
INTRAVENOUS | Status: DC | PRN
  Administered 2011-12-13: 40 mg via INTRAVENOUS

## 2011-12-13 MED ORDER — ACETAMINOPHEN 325 MG PO TABS
650.0000 mg | ORAL_TABLET | Freq: Four times a day (QID) | ORAL | Status: DC | PRN
  Administered 2011-12-13: 650 mg via ORAL
  Filled 2011-12-13: qty 2

## 2011-12-13 MED ORDER — FERROUS SULFATE 325 (65 FE) MG PO TABS
325.0000 mg | ORAL_TABLET | Freq: Three times a day (TID) | ORAL | Status: DC
  Administered 2011-12-13 – 2011-12-16 (×6): 325 mg via ORAL
  Filled 2011-12-13 (×11): qty 1

## 2011-12-13 MED ORDER — HYDROCHLOROTHIAZIDE 25 MG PO TABS
12.5000 mg | ORAL_TABLET | Freq: Every day | ORAL | Status: DC
  Filled 2011-12-13: qty 0.5

## 2011-12-13 MED ORDER — ONDANSETRON HCL 4 MG/2ML IJ SOLN
INTRAMUSCULAR | Status: DC | PRN
  Administered 2011-12-13: 4 mg via INTRAVENOUS

## 2011-12-13 MED ORDER — POLYETHYLENE GLYCOL 3350 17 G PO PACK
17.0000 g | PACK | Freq: Every day | ORAL | Status: DC | PRN
  Filled 2011-12-13 (×2): qty 1

## 2011-12-13 MED ORDER — HYDROMORPHONE HCL PF 1 MG/ML IJ SOLN: 0.5000 mg | INTRAMUSCULAR | Status: DC | PRN

## 2011-12-13 MED ORDER — RIVAROXABAN 10 MG PO TABS
10.0000 mg | ORAL_TABLET | Freq: Every day | ORAL | Status: DC
  Administered 2011-12-14 – 2011-12-16 (×3): 10 mg via ORAL
  Filled 2011-12-13 (×3): qty 1

## 2011-12-13 MED ORDER — HEMOSTATIC AGENTS (NO CHARGE) OPTIME
TOPICAL | Status: DC | PRN
  Administered 2011-12-13: 1 via TOPICAL

## 2011-12-13 MED ORDER — FLEET ENEMA 7-19 GM/118ML RE ENEM
1.0000 | ENEMA | Freq: Once | RECTAL | Status: AC | PRN
  Filled 2011-12-13: qty 1

## 2011-12-13 MED ORDER — HYDROCODONE-ACETAMINOPHEN 10-325 MG PO TABS: 1.0000 | ORAL_TABLET | ORAL | Status: DC | PRN

## 2011-12-13 MED ORDER — ACETAMINOPHEN 325 MG PO TABS: 650.0000 mg | ORAL_TABLET | Freq: Four times a day (QID) | ORAL | Status: DC | PRN

## 2011-12-13 MED ORDER — FERROUS SULFATE 325 (65 FE) MG PO TABS: 325.0000 mg | ORAL_TABLET | Freq: Three times a day (TID) | ORAL | Status: DC

## 2011-12-13 MED ORDER — SODIUM CHLORIDE 0.9 % IJ SOLN
INTRAMUSCULAR | Status: DC | PRN
  Administered 2011-12-13: 20 mL via INTRAVENOUS

## 2011-12-13 MED ORDER — BUPIVACAINE LIPOSOME 1.3 % IJ SUSP
20.0000 mL | Freq: Once | INTRAMUSCULAR | Status: AC
  Administered 2011-12-13: 20 mL
  Filled 2011-12-13: qty 20

## 2011-12-13 MED ORDER — PROPOFOL 10 MG/ML IV EMUL
INTRAVENOUS | Status: DC | PRN
  Administered 2011-12-13: 110 mg via INTRAVENOUS

## 2011-12-13 MED ORDER — GLYCOPYRROLATE 0.2 MG/ML IJ SOLN
INTRAMUSCULAR | Status: DC | PRN
  Administered 2011-12-13: .6 mg via INTRAVENOUS
  Administered 2011-12-13: 0.2 mg via INTRAVENOUS

## 2011-12-13 MED ORDER — MIDAZOLAM HCL 5 MG/5ML IJ SOLN
INTRAMUSCULAR | Status: DC | PRN
  Administered 2011-12-13: 1 mg via INTRAVENOUS

## 2011-12-13 MED ORDER — HYDROMORPHONE HCL PF 1 MG/ML IJ SOLN
0.2500 mg | INTRAMUSCULAR | Status: DC | PRN
  Administered 2011-12-13 (×3): 0.25 mg via INTRAVENOUS

## 2011-12-13 SURGICAL SUPPLY — 65 items
BAG SPEC THK2 15X12 ZIP CLS (MISCELLANEOUS) ×1
BAG ZIPLOCK 12X15 (MISCELLANEOUS) ×2 IMPLANT
BANDAGE ELASTIC 4 VELCRO ST LF (GAUZE/BANDAGES/DRESSINGS) ×2 IMPLANT
BANDAGE ELASTIC 6 VELCRO ST LF (GAUZE/BANDAGES/DRESSINGS) ×2 IMPLANT
BANDAGE ESMARK 6X9 LF (GAUZE/BANDAGES/DRESSINGS) ×1 IMPLANT
BANDAGE GAUZE ELAST BULKY 4 IN (GAUZE/BANDAGES/DRESSINGS) ×2 IMPLANT
BLADE SAG 18X100X1.27 (BLADE) ×2 IMPLANT
BLADE SAW SGTL 13.0X1.19X90.0M (BLADE) ×2 IMPLANT
BNDG CMPR 9X6 STRL LF SNTH (GAUZE/BANDAGES/DRESSINGS) ×1
BNDG ESMARK 6X9 LF (GAUZE/BANDAGES/DRESSINGS) ×2
BONE CEMENT GENTAMICIN (Cement) ×2 IMPLANT
CEMENT BONE GENTAMICIN 40 (Cement) ×2 IMPLANT
CLOTH BEACON ORANGE TIMEOUT ST (SAFETY) ×2 IMPLANT
CUFF TOURN SGL QUICK 34 (TOURNIQUET CUFF) ×2
CUFF TRNQT CYL 34X4X40X1 (TOURNIQUET CUFF) ×1 IMPLANT
DRAPE EXTREMITY T 121X128X90 (DRAPE) ×2 IMPLANT
DRAPE INCISE IOBAN 66X45 STRL (DRAPES) ×1 IMPLANT
DRAPE LG THREE QUARTER DISP (DRAPES) ×4 IMPLANT
DRAPE POUCH INSTRU U-SHP 10X18 (DRAPES) ×2 IMPLANT
DRAPE U-SHAPE 47X51 STRL (DRAPES) ×2 IMPLANT
DRSG ADAPTIC 3X8 NADH LF (GAUZE/BANDAGES/DRESSINGS) ×2 IMPLANT
DRSG PAD ABDOMINAL 8X10 ST (GAUZE/BANDAGES/DRESSINGS) ×7 IMPLANT
DURAPREP 26ML APPLICATOR (WOUND CARE) ×2 IMPLANT
ELECT REM PT RETURN 9FT ADLT (ELECTROSURGICAL) ×2
ELECTRODE REM PT RTRN 9FT ADLT (ELECTROSURGICAL) ×1 IMPLANT
EVACUATOR 1/8 PVC DRAIN (DRAIN) ×2 IMPLANT
FACESHIELD LNG OPTICON STERILE (SAFETY) ×12 IMPLANT
GAUZE KERLIX 2  STERILE LF (GAUZE/BANDAGES/DRESSINGS) ×1 IMPLANT
GLOVE BIOGEL PI IND STRL 8 (GLOVE) ×1 IMPLANT
GLOVE BIOGEL PI IND STRL 8.5 (GLOVE) ×1 IMPLANT
GLOVE BIOGEL PI INDICATOR 8 (GLOVE) ×1
GLOVE BIOGEL PI INDICATOR 8.5 (GLOVE)
GLOVE ECLIPSE 8.0 STRL XLNG CF (GLOVE) ×4 IMPLANT
GOWN PREVENTION PLUS LG XLONG (DISPOSABLE) ×4 IMPLANT
GOWN STRL REIN XL XLG (GOWN DISPOSABLE) ×4 IMPLANT
HANDPIECE INTERPULSE COAX TIP (DISPOSABLE) ×2
IMMOBILIZER KNEE 20 (SOFTGOODS) ×2
IMMOBILIZER KNEE 20 THIGH 36 (SOFTGOODS) IMPLANT
KIT BASIN OR (CUSTOM PROCEDURE TRAY) ×2 IMPLANT
MANIFOLD NEPTUNE II (INSTRUMENTS) ×2 IMPLANT
NEEDLE HYPO 22GX1.5 SAFETY (NEEDLE) ×2 IMPLANT
NS IRRIG 1000ML POUR BTL (IV SOLUTION) ×2 IMPLANT
PACK TOTAL JOINT (CUSTOM PROCEDURE TRAY) ×2 IMPLANT
PAD ABD 7.5X8 STRL (GAUZE/BANDAGES/DRESSINGS) ×2 IMPLANT
POSITIONER SURGICAL ARM (MISCELLANEOUS) ×2 IMPLANT
SET HNDPC FAN SPRY TIP SCT (DISPOSABLE) ×1 IMPLANT
SET PAD KNEE POSITIONER (MISCELLANEOUS) ×2 IMPLANT
SPONGE GAUZE 4X4 STERILE 39 (GAUZE/BANDAGES/DRESSINGS) ×1 IMPLANT
SPONGE LAP 18X18 X RAY DECT (DISPOSABLE) ×1 IMPLANT
SPONGE SURGIFOAM ABS GEL 100 (HEMOSTASIS) ×2 IMPLANT
STAPLER VISISTAT 35W (STAPLE) IMPLANT
SUCTION FRAZIER 12FR DISP (SUCTIONS) ×2 IMPLANT
SUT BONE WAX W31G (SUTURE) ×2 IMPLANT
SUT VIC AB 0 CT1 27 (SUTURE) ×4
SUT VIC AB 0 CT1 27XBRD ANTBC (SUTURE) ×2 IMPLANT
SUT VIC AB 1 CT1 27 (SUTURE) ×8
SUT VIC AB 1 CT1 27XBRD ANTBC (SUTURE) ×5 IMPLANT
SUT VIC AB 2-0 CT1 27 (SUTURE) ×2
SUT VIC AB 2-0 CT1 TAPERPNT 27 (SUTURE) ×3 IMPLANT
SYR 20CC LL (SYRINGE) ×2 IMPLANT
TOWEL OR 17X26 10 PK STRL BLUE (TOWEL DISPOSABLE) ×4 IMPLANT
TOWER CARTRIDGE SMART MIX (DISPOSABLE) ×2 IMPLANT
TRAY FOLEY CATH 14FRSI W/METER (CATHETERS) ×2 IMPLANT
WATER STERILE IRR 1500ML POUR (IV SOLUTION) ×2 IMPLANT
WRAP KNEE MAXI GEL POST OP (GAUZE/BANDAGES/DRESSINGS) ×4 IMPLANT

## 2011-12-13 NOTE — Interval H&P Note (Signed)
History and Physical Interval Note:  12/13/2011 8:19 AM  [name]  has presented today for surgery, with the diagnosis of osteoarthritis right knee  The various methods of treatment have been discussed with the patient and family. After consideration of risks, benefits and other options for treatment, the patient has consented to  Procedure(s) (LRB): TOTAL KNEE ARTHROPLASTY (Right) as a surgical intervention .  The patients' history has been reviewed, patient examined, no change in status, stable for surgery.  I have reviewed the patients' chart and labs.  Questions were answered to the patient's satisfaction.     Ladarrell Cornwall A

## 2011-12-13 NOTE — Progress Notes (Signed)
Utilization review completed.  

## 2011-12-13 NOTE — Preoperative (Signed)
Beta Blockers   Reason not to administer Beta Blockers:Not Applicable 

## 2011-12-13 NOTE — Anesthesia Postprocedure Evaluation (Signed)
  Anesthesia Post-op Note  Patient: [name]  Procedure(s) Performed: Procedure(s) (LRB): TOTAL KNEE ARTHROPLASTY (Right)  Patient Location: PACU  Anesthesia Type: General  Level of Consciousness: oriented and sedated  Airway and Oxygen Therapy: Patient Spontanous Breathing and Patient connected to nasal cannula oxygen  Post-op Pain: mild  Post-op Assessment: Post-op Vital signs reviewed, Patient's Cardiovascular Status Stable, Respiratory Function Stable and Patent Airway  Post-op Vital Signs: stable  Complications: No apparent anesthesia complications

## 2011-12-13 NOTE — Transfer of Care (Signed)
Immediate Anesthesia Transfer of Care Note  Patient: [name]  Procedure(s) Performed: Procedure(s) (LRB): TOTAL KNEE ARTHROPLASTY (Right)  Patient Location: PACU  Anesthesia Type: General  Level of Consciousness: sedated, patient cooperative and responds to stimulaton  Airway & Oxygen Therapy: Patient Spontanous Breathing and Patient connected to face mask oxgen  Post-op Assessment: Report given to PACU RN and Post -op Vital signs reviewed and stable  Post vital signs: Reviewed and stable  Complications: No apparent anesthesia complications

## 2011-12-13 NOTE — Anesthesia Preprocedure Evaluation (Addendum)
Anesthesia Evaluation  Patient identified by MRN, date of birth, ID band Patient awake    Reviewed: Allergy & Precautions, H&P , NPO status , Patient's Chart, lab work & pertinent test results, reviewed documented beta blocker date and time   Airway Mallampati: II TM Distance: >3 FB Neck ROM: Full    Dental  (+) Teeth Intact and Dental Advisory Given   Pulmonary Current Smoker,  clear to auscultation  + decreased breath sounds      Cardiovascular hypertension, Pt. on medications Regular Normal Denies cardiac symptoms Given CV clearance   Neuro/Psych Negative Neurological ROS  Negative Psych ROS   GI/Hepatic negative GI ROS, Neg liver ROS,   Endo/Other  Negative Endocrine ROS  Renal/GU negative Renal ROS  Genitourinary negative   Musculoskeletal negative musculoskeletal ROS (+)   Abdominal   Peds negative pediatric ROS (+)  Hematology negative hematology ROS (+)   Anesthesia Other Findings   Reproductive/Obstetrics negative OB ROS                          Anesthesia Physical Anesthesia Plan  ASA: II  Anesthesia Plan: General   Post-op Pain Management:    Induction: Intravenous  Airway Management Planned: Oral ETT  Additional Equipment:   Intra-op Plan:   Post-operative Plan: Extubation in OR  Informed Consent: I have reviewed the patients History and Physical, chart, labs and discussed the procedure including the risks, benefits and alternatives for the proposed anesthesia with the patient or authorized representative who has indicated his/her understanding and acceptance.   Dental advisory given  Plan Discussed with: CRNA and Surgeon  Anesthesia Plan Comments:         Anesthesia Quick Evaluation

## 2011-12-13 NOTE — Brief Op Note (Signed)
12/13/2011  10:56 AM  PATIENT:  [name]  78 y.o. female  PRE-OPERATIVE DIAGNOSIS:  osteoarthritis right knee  POST-OPERATIVE DIAGNOSIS:  osteoarthritis right knee  PROCEDURE:  Procedure(s) (LRB): TOTAL KNEE ARTHROPLASTY (Right)  SURGEON:  Surgeon(s) and Role:    * Jacki Cones, MD - Primary  PHYSICIAN ASSISTANT:   ASSISTANTS: Dimitri Ped PA   ANESTHESIA:   general  EBL:  Total I/O In: 2000 [I.V.:2000] Out: 190 [Urine:150; Blood:40]  BLOOD ADMINISTERED:none  DRAINS: (1) Hemovact drain(s) in the right with  Suction Open   LOCAL MEDICATIONS USED:  BUPIVICAINE 20cc mixed with 20cc Normal Saline  SPECIMEN:  No Specimen  DISPOSITION OF SPECIMEN:  N/A  COUNTS:  YES  TOURNIQUET:   Total Tourniquet Time Documented: Thigh (Right) - 99 minutes  DICTATION: .Other Dictation: Dictation Number [id number]  PLAN OF CARE: Admit to inpatient   PATIENT DISPOSITION:  PACU - hemodynamically stable.   Delay start of Pharmacological VTE agent (>24hrs) due to surgical blood loss or risk of bleeding: yes

## 2011-12-14 LAB — CBC
HCT: 38.5 % (ref 36.0–46.0)
MCH: 30.8 pg (ref 26.0–34.0)
MCHC: 32.2 g/dL (ref 30.0–36.0)
MCV: 95.8 fL (ref 78.0–100.0)
Platelets: 161 10*3/uL (ref 150–400)
RDW: 14.5 % (ref 11.5–15.5)
WBC: 11.4 10*3/uL — ABNORMAL HIGH (ref 4.0–10.5)

## 2011-12-14 LAB — BASIC METABOLIC PANEL
BUN: 7 mg/dL (ref 6–23)
Calcium: 9.1 mg/dL (ref 8.4–10.5)
Creatinine, Ser: 0.57 mg/dL (ref 0.50–1.10)
GFR calc Af Amer: >90 mL/min (ref >90)
GFR calc non Af Amer: 88 mL/min — ABNORMAL LOW (ref >90)

## 2011-12-14 MED ORDER — LACTATED RINGERS IV SOLN
INTRAVENOUS | Status: DC
  Administered 2011-12-14: 13:00:00 via INTRAVENOUS
  Administered 2011-12-14: 75 mL/h via INTRAVENOUS

## 2011-12-14 NOTE — Progress Notes (Signed)
Patients iv leaking at the site, left wrist c/o its hurting also, order to leave out per drew perkins pa, note if patient were to become nauseated restart iv at present lactated ringers at 75cc.

## 2011-12-14 NOTE — Progress Notes (Signed)
Physical Therapy Treatment Patient Details Name: [name] MRN: [id number] DOB: Jan 08, 1934 Today's Date: 12/14/2011  PT Assessment/Plan  PT - Assessment/Plan Comments on Treatment Session: pt feeling better, slight increase in distance of ambulation.  PT Plan: Discharge plan remains appropriate PT Frequency: 7X/week Recommendations for Other Services: OT consult Follow Up Recommendations: Home health PT Equipment Recommended: None recommended by PT PT Goals  Acute Rehab PT Goals PT Goal Formulation: With patient/family Time For Goal Achievement: 7 days Pt will go Supine/Side to Sit: with supervision PT Goal: Supine/Side to Sit - Progress: Progressing toward goal Pt will go Sit to Supine/Side: with supervision PT Goal: Sit to Supine/Side - Progress: Progressing toward goal Pt will go Sit to Stand: with supervision PT Goal: Sit to Stand - Progress: Progressing toward goal Pt will go Stand to Sit: with supervision PT Goal: Stand to Sit - Progress: Progressing toward goal Pt will Ambulate: 51 - 150 feet;with supervision;with rolling walker PT Goal: Ambulate - Progress: Progressing toward goal  PT Treatment Precautions/Restrictions  Precautions Precautions: Knee Required Braces or Orthoses: Yes Knee Immobilizer: Discontinue once straight leg raise with < 10 degree lag Restrictions Weight Bearing Restrictions: No Mobility (including Balance) Bed Mobility Sit to Supine: 4: Min assist Sit to Supine - Details (indicate cue type and reason): vc/ min A  to get RLE onto bed Transfers Transfers: Yes Sit to Stand: 4: Min assist;From chair/3-in-1;From toilet;With upper extremity assist Sit to Stand Details (indicate cue type and reason): vc to push from arms/rail Stand to Sit: 4: Min assist;To toilet;To bed;With upper extremity assist Stand to Sit Details: vc to step RLE forward Ambulation/Gait Ambulation/Gait: Yes Ambulation/Gait Assistance: 4: Min assist Ambulation/Gait  Assistance Details (indicate cue type and reason): vc for sequence Ambulation Distance (Feet): 20 Feet (then 15) Assistive device: Rolling walker Gait Pattern: Step-to pattern    Exercise  Total Joint Exercises Heel Slides: AAROM;Right;10 reps;Supine End of Session PT - End of Session Equipment Utilized During Treatment: Right knee immobilizer Activity Tolerance: Patient tolerated treatment well Patient left: in chair;with call bell in reach Nurse Communication: Mobility status for transfers General Behavior During Session: [facility] for tasks performed Cognition: [facility] for tasks performed  Rada Hay 12/14/2011, 4:54 PM

## 2011-12-14 NOTE — Progress Notes (Signed)
Physical Therapy Treatment Patient Details Name: ADRIENNA KARIS MRN: [id number] DOB: 26-Aug-1934 Today's Date: 12/14/2011  PT Assessment/Plan  PT - Assessment/Plan Comments on Treatment Session: pt feeling better, slight increase in distance of ambulation. needed pain meds, RN notified PT Plan: Discharge plan remains appropriate PT Frequency: 7X/week Recommendations for Other Services: OT consult Follow Up Recommendations: Home health PT Equipment Recommended: None recommended by PT PT Goals  Acute Rehab PT Goals PT Goal Formulation: With patient/family Time For Goal Achievement: 7 days Pt will go Supine/Side to Sit: with supervision PT Goal: Supine/Side to Sit - Progress: Progressing toward goal Pt will go Sit to Supine/Side: with supervision PT Goal: Sit to Supine/Side - Progress: Goal set today Pt will go Sit to Stand: with supervision PT Goal: Sit to Stand - Progress: Progressing toward goal Pt will go Stand to Sit: with supervision PT Goal: Stand to Sit - Progress: Progressing toward goal Pt will Ambulate: 51 - 150 feet;with supervision;with rolling walker PT Goal: Ambulate - Progress: Progressing toward goal Pt will Go Up / Down Stairs: 3-5 stairs;with min assist;with least restrictive assistive device PT Goal: Up/Down Stairs - Progress: Goal set today Pt will Perform Home Exercise Program: with supervision, verbal cues required/provided PT Goal: Perform Home Exercise Program - Progress: Goal set today  PT Treatment Precautions/Restrictions  Precautions Precautions: Knee Required Braces or Orthoses: Yes Knee Immobilizer: Discontinue once straight leg raise with < 10 degree lag Restrictions Weight Bearing Restrictions: No Mobility (including Balance) Bed Mobility Bed Mobility: Yes Supine to Sit: 4: Min assist;With rails;HOB elevated (Comment degrees) Supine to Sit Details (indicate cue type and reason): support  RLE as leg  moved to eob Transfers Transfers: Yes Sit  to Stand: 4: Min assist;From bed;From chair/3-in-1;From elevated surface;With upper extremity assist Sit to Stand Details (indicate cue type and reason): vc to push from rails Stand to Sit: 4: Min assist;With upper extremity assist;To chair/3-in-1 Stand to Sit Details: vc to step RLE forward Ambulation/Gait Ambulation/Gait: Yes Ambulation/Gait Assistance: 4: Min assist Ambulation/Gait Assistance Details (indicate cue type and reason): vc for sequence Ambulation Distance (Feet): 8 Feet (x2) Assistive device: Rolling walker Gait Pattern: Step-to pattern  Posture/Postural Control Posture/Postural Control: No significant limitations Exercise   End of Session PT - End of Session Equipment Utilized During Treatment: Right knee immobilizer Activity Tolerance: Patient tolerated treatment well Patient left: in chair;with call bell in reach Nurse Communication: Mobility status for transfers General Behavior During Session: [facility] for tasks performed Cognition: [facility] for tasks performed  Rada Hay 12/14/2011, 3:09 PM

## 2011-12-14 NOTE — Progress Notes (Signed)
Subjective: Hemovac Dcd. She is alert and oriented. Circulation in foot intact . Will ambulate today.   Objective: Vital signs in last 24 hours: Temp:  [96.9 F (36.1 C)-99.8 F (37.7 C)] 98.8 F (37.1 C) (02/21 0510) Pulse Rate:  [49-84] 84  (02/21 0510) Resp:  [10-20] 12  (02/21 0510) BP: (93-143)/(42-72) 104/61 mmHg (02/21 0510) SpO2:  [95 %-100 %] 97 % (02/21 0510) Weight:  [64.864 kg (143 lb)] 64.864 kg (143 lb) (02/20 1701)  Intake/Output from previous day: 02/20 0701 - 02/21 0700 In: 3880 [P.O.:120; I.V.:3500; IV Piggyback:260] Out: 3275 [Urine:3175; Drains:60; Blood:40] Intake/Output this shift:     Basename 12/14/11 0400  HGB 12.4    Basename 12/14/11 0400  WBC 11.4*  RBC 4.02  HCT 38.5  PLT 161    Basename 12/14/11 0400  NA 137  K 3.6  CL 101  CO2 30  BUN 7  CREATININE 0.57  GLUCOSE 125*  CALCIUM 9.1   No results found for this basename: LABPT:2,INR:2 in the last 72 hours  Neurologically intact Dorsiflexion/Plantar flexion intact  Assessment/Plan: Plan on PT today and DC. Sat.   Sims Laday A 12/14/2011, 7:14 AM

## 2011-12-14 NOTE — Evaluation (Signed)
Physical Therapy Evaluation Patient Details Name: [name] MRN: [id number] DOB: Aug 21, 1934 Today's Date: 12/14/2011  Problem List:  Patient Active Problem List  Diagnoses  . Osteoarthritis of right knee    Past Medical History:  Past Medical History  Diagnosis Date  . Hypertension   . Pneumonia     hx of   . Arthritis     knees, hands   . Depression    Past Surgical History:  Past Surgical History  Procedure Date  . Cholecystectomy   . Abdominal hysterectomy   . Joint replacement     right and left hip replacement   . Other surgical history     arthroscopic right knee   . Eye surgery     left eye cataract surgery   . Other surgical history     right carpal tunnel  . Other surgical history     right elbow surgery   . Hernia repair     left inguinal hernia repair   . Other surgical history     oophorectomy 1958    PT Assessment/Plan/Recommendation PT Assessment Clinical Impression Statement: pt is s/p RTKA , limited by nausea, plans dc home with family care.  Pt will benefit from PT to improve in rom, strength and mobility to dc to home. PT Recommendation/Assessment: Patient will need skilled PT in the acute care venue PT Problem List: Decreased strength;Decreased range of motion;Decreased activity tolerance;Decreased mobility;Decreased knowledge of use of DME;Decreased knowledge of precautions;Pain PT Therapy Diagnosis : Difficulty walking;Acute pain PT Plan PT Frequency: 7X/week PT Recommendation Recommendations for Other Services: OT consult Follow Up Recommendations: Home health PT Equipment Recommended: None recommended by PT PT Goals  Acute Rehab PT Goals PT Goal Formulation: With patient/family Time For Goal Achievement: 7 days Pt will go Supine/Side to Sit: with supervision PT Goal: Supine/Side to Sit - Progress: Goal set today Pt will go Sit to Supine/Side: with supervision PT Goal: Sit to Supine/Side - Progress: Goal set today Pt will go Sit  to Stand: with supervision PT Goal: Sit to Stand - Progress: Goal set today Pt will go Stand to Sit: with supervision PT Goal: Stand to Sit - Progress: Goal set today Pt will Ambulate: 51 - 150 feet;with supervision;with rolling walker PT Goal: Ambulate - Progress: Goal set today Pt will Go Up / Down Stairs: 3-5 stairs;with min assist;with least restrictive assistive device PT Goal: Up/Down Stairs - Progress: Goal set today Pt will Perform Home Exercise Program: with supervision, verbal cues required/provided PT Goal: Perform Home Exercise Program - Progress: Goal set today  PT Evaluation Precautions/Restrictions  Precautions Precautions: Knee Required Braces or Orthoses: Yes Knee Immobilizer: Discontinue once straight leg raise with < 10 degree lag Restrictions Weight Bearing Restrictions: No Prior Functioning  Home Living Lives With: Alone Receives Help From: Family Type of Home: House Home Access: Stairs to enter Entrance Stairs-Rails: None Entrance Stairs-Number of Steps: 4 Home Adaptive Equipment: Walker - rolling (chair ht. toilet) Prior Function Level of Independence: Independent with basic ADLs Able to Take Stairs?: Yes Driving: Yes Vocation: Retired Financial risk analyst Arousal/Alertness: Awake/alert Overall Cognitive Status: Appears within functional limits for tasks assessed Orientation Level: Oriented X4 Sensation/Coordination Sensation Light Touch: Appears Intact Coordination Gross Motor Movements are Fluid and Coordinated: Yes Extremity Assessment RUE Assessment RUE Assessment: Within Functional Limits LUE Assessment LUE Assessment: Within Functional Limits RLE Assessment RLE Assessment: Exceptions to [facility] RLE AROM (degrees) RLE Overall AROM Comments: pt was able to perform SLR with min A,  knee flexion to 40  RLE Strength RLE Overall Strength Comments: SLR with Min A Mobility (including Balance) Bed Mobility Bed Mobility: Yes Supine to Sit: 4: Min  assist;With rails;HOB elevated (Comment degrees) Supine to Sit Details (indicate cue type and reason): vc to turn to swing legs over. Transfers Transfers: Yes Sit to Stand: 4: Min assist;From bed;With upper extremity assist Sit to Stand Details (indicate cue type and reason): vc to push from bed. Stand to Sit: 4: Min assist;With upper extremity assist;To chair/3-in-1 Stand to Sit Details: vc to place hands and RLE forward. Ambulation/Gait Ambulation/Gait: Yes Ambulation/Gait Assistance: 1: +2 Total assist Ambulation/Gait Assistance Details (indicate cue type and reason): pt=70%, c/o dizziness and nausea limiting. Ambulation Distance (Feet): 10 Feet Assistive device: Rolling walker Gait Pattern: Step-to pattern  Posture/Postural Control Posture/Postural Control: No significant limitations Exercise  Total Joint Exercises Ankle Circles/Pumps: AROM;Right;10 reps Quad Sets: AROM;Right;10 reps Heel Slides: AAROM;Right;10 reps;Supine End of Session PT - End of Session Equipment Utilized During Treatment: Right knee immobilizer Activity Tolerance:  (by nausea) Patient left: in chair;with call bell in reach;with family/visitor present Nurse Communication: Mobility status for transfers General Behavior During Session: [facility] for tasks performed Cognition: [facility] for tasks performed  Rada Hay 12/14/2011, 12:46 PM

## 2011-12-14 NOTE — Plan of Care (Signed)
Problem: Phase II Progression Outcomes Goal: Other Phase II Outcomes/Goals Outcome: Progressing xarelto care notes given to patient with instruction

## 2011-12-14 NOTE — Op Note (Signed)
NAMETALAYIA, HJORT                ACCOUNT NO.:  [id number]  MEDICAL RECORD NO.:  [id number]  LOCATION:  1601                         FACILITY:  [facility]  PHYSICIAN:  Georges Lynch. Zaylei Mullane, M.D.DATE OF BIRTH:  02/11/1934  DATE OF PROCEDURE:  12/13/2011 DATE OF DISCHARGE:                              OPERATIVE REPORT   SURGEON:  Georges Lynch. Tycen Dockter, M.D.  ASSISTANT:  Dimitri Ped, [place]  PREOPERATIVE DIAGNOSIS:  Severe degenerative arthritis of the right knee with bone-on-bone and especially in the lateral compartment, she had a severe genu valgus.  She also had a flexion contracture.  She was treated conservatively, but had really no real improvement and she elected to go on and I agreed to have a right total knee arthroplasty.  OPERATION:  Right total knee arthroplasty utilizing DePuy system.  Sizes used were size 2 right femur, size 2 tibial tray with a 12.5 mm thickness size 2 tibial insert.  Patella was a size 35 with 3 pegs.  The gentamicin was used in the cement.  PROCEDURE IN DETAIL:  Under general anesthesia, after the routine orthopedic prepping and draping was carried out, the leg was exsanguinated with an Esmarch, tourniquet was elevated at 325 mmHg.  At this time, the right leg was placed in a DeMayo leg holder.  The knee was flexed and the anterior approach of the knee was carried out.  Two flaps were created.  I then carried out a median parapatellar incision. I reflected patella laterally.  I then did medial and lateral meniscectomies and excised the anterior and posterior cruciate ligaments.  Note the bone was severely worn as we saw on x-ray.  Initial drill hole was made in the intercondylar notch.  I then inserted my guidewire and carried out and removed 11 mm thickness off the distal femur.  Following that, I then measured the femur to be a size 2 right. I then cut the appropriate cuts with the next jig.  We carried out anterior, posterior chamfer cuts, all loose  pieces of bone were removed. After repair of the distal femur, I then prepared the tibia.  We measured the tibia to be a size 2 tibial tray.  A distal hole was placed in the metaphysis of the tibia.  Guide rod was entered after we irrigated the canal.  It was noted at both times, the femoral canal and the tibial canal were constantly irrigated to try to prevent any fat emboli.  After this, I removed approximately 6 mm thickness off the affected lateral side of the tibia.  I then inserted my spacer blocks, we had excellent tension with a 12.5 mm thickness inserts.  Following that, I then cut my keel cut of the tibia and then I cut my notch cut out of the distal femur.  I then thoroughly irrigated out the area and inserted my trial components.  We felt that the size 12.5 mm thickness tibial insert was the most stable, we had excellent function with it.  I then did a resurfacing procedure on the patella for size 35 mm patella. Three drill holes were then made in the articular surface of the patella.  The patella button was  then inserted, we had nice fit.  I then removed all trial components, thoroughly water picked out the knee and then cemented all 3 components in simultaneously.  After the cement was hardened, we removed all loose pieces of cement.  I then inserted my permanent 12.5 mm thickness size 2 rotating platform, and reduced the knee.  I injected the knee with a mixture of 20 cc of Exparel with 20 cc of normal saline.  Hemovac drain was inserted.  Knee was closed in layers in the usual fashion.  Sterile dressings were applied.          ______________________________ Georges Lynch Darrelyn Hillock, M.D.     RAG/MEDQ  D:  12/13/2011  T:  12/14/2011  Job:  [id number]  cc:   Caryn Bee L. Little, M.D. Fax: [phone]

## 2011-12-14 NOTE — Progress Notes (Signed)
CARE MANAGEMENT NOTE 12/14/2011  Patient:  ANDILYNN, DELAVEGA   Account Number:  [id number]  Date Initiated:  12/14/2011  Documentation initiated by:  Colleen Can  Subjective/Objective Assessment:   dx osteoarthritis rt knee; total knee replacemnt     Action/Plan:   CM spoke with patient and neighbor who will be caregiver. Patient states she plans to go back to her home in [place] where her neighbor will be her caregiver. Spoke with neighbor and she plans to stay with patient and be caregiver.   Anticipated DC Date:  12/16/2011   Anticipated DC Plan:  HOME W HOME HEALTH SERVICES  In-house referral  Clinical Social Worker      DC Planning Services  CM consult      [facility] Choice  HOME HEALTH   Choice offered to / List presented to:  C-1 Patient   DME arranged  NA      DME agency  NA     HH arranged  HH-2 PT      [facility] agency  [facility] Care   Status of service:  In process, will continue to follow Medicare Important Message given?   (If response is "NO", the following Medicare IM given date fields will be blank) Date Medicare IM given:   Date Additional Medicare IM given:    Discharge Disposition:    Per UR Regulation:    Comments:  12/14/11 Pt plans to borrow bsc from church. Pt was referred to [facility] prior to admission. Pt offered choice and chooses [facility]. List of agencies for [facility] placed in shadow chart.

## 2011-12-15 LAB — BASIC METABOLIC PANEL
BUN: 8 mg/dL (ref 6–23)
Chloride: 97 mEq/L (ref 96–112)
GFR calc Af Amer: >90 mL/min (ref >90)
GFR calc non Af Amer: >90 mL/min (ref >90)
Potassium: 3.2 mEq/L — ABNORMAL LOW (ref 3.5–5.1)
Sodium: 135 mEq/L (ref 135–145)

## 2011-12-15 LAB — CBC
HCT: 34.6 % — ABNORMAL LOW (ref 36.0–46.0)
MCHC: 32.4 g/dL (ref 30.0–36.0)
Platelets: 134 10*3/uL — ABNORMAL LOW (ref 150–400)
RDW: 14.4 % (ref 11.5–15.5)
WBC: 14.5 10*3/uL — ABNORMAL HIGH (ref 4.0–10.5)

## 2011-12-15 MED ORDER — POTASSIUM CHLORIDE 20 MEQ PO PACK: 20.0000 meq | PACK | Freq: Two times a day (BID) | ORAL | Status: DC

## 2011-12-15 MED ORDER — METHOCARBAMOL 500 MG PO TABS: 500.0000 mg | ORAL_TABLET | Freq: Four times a day (QID) | ORAL | Status: AC | PRN

## 2011-12-15 MED ORDER — POTASSIUM CHLORIDE 20 MEQ/15ML (10%) PO LIQD
20.0000 meq | Freq: Two times a day (BID) | ORAL | Status: DC
  Administered 2011-12-15 – 2011-12-16 (×2): 20 meq via ORAL
  Filled 2011-12-15 (×4): qty 15

## 2011-12-15 MED ORDER — OXYCODONE-ACETAMINOPHEN 5-325 MG PO TABS: 1.0000 | ORAL_TABLET | ORAL | Status: DC | PRN

## 2011-12-15 MED ORDER — RIVAROXABAN 10 MG PO TABS: 10.0000 mg | ORAL_TABLET | Freq: Every day | ORAL | Status: DC

## 2011-12-15 MED ORDER — POTASSIUM CHLORIDE 20 MEQ/15ML (10%) PO LIQD
20.0000 meq | Freq: Two times a day (BID) | ORAL | Status: DC
  Administered 2011-12-15: 20 meq via ORAL
  Filled 2011-12-15 (×2): qty 15

## 2011-12-15 MED ORDER — OXYCODONE-ACETAMINOPHEN 5-325 MG PO TABS: 1.0000 | ORAL_TABLET | ORAL | Status: AC | PRN

## 2011-12-15 MED ORDER — METHOCARBAMOL 500 MG PO TABS: 500.0000 mg | ORAL_TABLET | Freq: Four times a day (QID) | ORAL | Status: DC | PRN

## 2011-12-15 NOTE — Progress Notes (Signed)
Physical Therapy Treatment Patient Details Name: [name] MRN: [id number] DOB: 20-Nov-1933 Today's Date: 12/15/2011  PT Assessment/Plan  PT - Assessment/Plan Comments on Treatment Session: has urgency to urinate, stress incontinence. PT Plan: Discharge plan remains appropriate PT Frequency: 7X/week Recommendations for Other Services: OT consult Follow Up Recommendations: Home health PT Equipment Recommended: 3 in 1 bedside comode PT Goals  Acute Rehab PT Goals PT Goal Formulation: With patient/family Time For Goal Achievement: 7 days Pt will go Supine/Side to Sit: with supervision PT Goal: Supine/Side to Sit - Progress: Progressing toward goal Pt will go Sit to Stand: with supervision PT Goal: Sit to Stand - Progress: Progressing toward goal Pt will go Stand to Sit: with supervision PT Goal: Stand to Sit - Progress: Progressing toward goal Pt will Ambulate: 51 - 150 feet;with supervision;with rolling walker PT Goal: Ambulate - Progress: Progressing toward goal Pt will Perform Home Exercise Program: with supervision, verbal cues required/provided PT Goal: Perform Home Exercise Program - Progress: Progressing toward goal  PT Treatment Precautions/Restrictions  Precautions Precautions: Knee Required Braces or Orthoses: Yes Knee Immobilizer:  (did not use but had more difficulty with weight bear.) Restrictions Weight Bearing Restrictions: No Mobility (including Balance) Bed Mobility Bed Mobility: Yes Supine to sit: 4: Min assist Transfers Sit to Stand: 4: Min assist;From bed;From chair/3-in-1;With upper extremity assist Sit to Stand Details (indicate cue type and reason): requires verbal cues to push from chair Stand to Sit: 4: Min assist;To chair/3-in-1;With armrests;With upper extremity assist Stand to Sit Details: requires assist to extens Rt. LE, and to reach back to surface Stand Pivot Transfers: 4: Min assist Stand Pivot Transfer Details (indicate cue type and  reason): vc to increase WB R Ambulation/Gait Ambulation/Gait Assistance: 4: Min assist Ambulation/Gait Assistance Details (indicate cue type and reason): v frequent vc for sequence,opt tends to take long step with R and past RW.  vc to place more weight on RLE Ambulation Distance (Feet): 20 Feet Assistive device: Rolling walker Gait Pattern: Step-to pattern    Exercise  Total Joint Exercises Ankle Circles/Pumps: AROM;Right;10 reps Quad Sets: AROM;Right;10 reps Short Arc QuadBarbaraann Boys;Right;10 reps;Supine Heel Slides: AAROM;Right;10 reps;Supine Hip ABduction/ADduction: AAROM;Right;10 reps;Supine Straight Leg Raises: AAROM;Right;10 reps;Supine End of Session PT - End of Session Activity Tolerance: Patient tolerated treatment well Patient left: in chair;with call bell in reach Nurse Communication: Mobility status for transfers General Behavior During Session: [facility] for tasks performed Cognition: [facility] for tasks performed  Rada Hay 12/15/2011, 12:47 PM

## 2011-12-15 NOTE — Evaluation (Signed)
Occupational Therapy Evaluation Patient Details Name: [name] MRN: [id number] DOB: 05-28-1934 Today's Date: 12/15/2011  Problem List:  Patient Active Problem List  Diagnoses  . Osteoarthritis of right knee    Past Medical History:  Past Medical History  Diagnosis Date  . Hypertension   . Pneumonia     hx of   . Arthritis     knees, hands   . Depression    Past Surgical History:  Past Surgical History  Procedure Date  . Cholecystectomy   . Abdominal hysterectomy   . Joint replacement     right and left hip replacement   . Other surgical history     arthroscopic right knee   . Eye surgery     left eye cataract surgery   . Other surgical history     right carpal tunnel  . Other surgical history     right elbow surgery   . Hernia repair     left inguinal hernia repair   . Other surgical history     oophorectomy 1958    OT Assessment/Plan/Recommendation OT Assessment Clinical Impression Statement: This 78 y.o. female admitted for Rt. TKA presents to OT with the below listed deficits that affect pt's performance with BADLs.  Feel pt. will benefit from OT to maximize safety and independence with BADLs.  Anticipate she will require min A - supervision upon discharge.  Pt. asked about difference between [facility] and SNF.  Explained this to pt. and she feels she can discharge home.  Also discussed use of 3-in-1 at bedside for urinary urgency issues - she agrees OT Recommendation/Assessment: Patient will need skilled OT in the acute care venue OT Problem List: Decreased strength;Decreased activity tolerance;Impaired balance (sitting and/or standing);Decreased knowledge of use of DME or AE;Decreased knowledge of precautions;Pain Barriers to Discharge: None OT Therapy Diagnosis : Generalized weakness;Acute pain OT Plan OT Frequency: Min 2X/week OT Treatment/Interventions: Self-care/ADL training;DME and/or AE instruction;Therapeutic activities;Patient/family education;Balance  training OT Recommendation Follow Up Recommendations: Home health OT Equipment Recommended: 3 in 1 bedside comode Individuals Consulted Consulted and Agree with Results and Recommendations: Patient OT Goals Acute Rehab OT Goals OT Goal Formulation: With patient Time For Goal Achievement: 7 days ADL Goals Pt Will Perform Grooming: with supervision;Standing at sink ADL Goal: Grooming - Progress: Goal set today Pt Will Perform Lower Body Bathing: with supervision;Sit to stand from chair Pt Will Perform Lower Body Dressing: with supervision;Sit to stand from chair;Sit to stand from bed ADL Goal: Lower Body Dressing - Progress: Goal set today Pt Will Transfer to Toilet: with supervision;Comfort height toilet;Ambulation ADL Goal: Toilet Transfer - Progress: Goal set today Pt Will Perform Toileting - Clothing Manipulation: with modified independence;Standing ADL Goal: Toileting - Clothing Manipulation - Progress: Goal set today Pt Will Perform Tub/Shower Transfer: with supervision;Ambulation;Shower seat without back ADL Goal: Web designer - Progress: Goal set today  OT Evaluation Precautions/Restrictions  Precautions Precautions: Knee Required Braces or Orthoses: Yes Knee Immobilizer: Discontinue once straight leg raise with < 10 degree lag Restrictions Weight Bearing Restrictions: No Prior Functioning Home Living Lives With: Alone Receives Help From: Family Type of Home: House Home Layout: One level Home Access: Stairs to enter Entrance Stairs-Rails: None Entrance Stairs-Number of Steps: 4 Bathroom Shower/Tub: Psychologist, counselling;Tub/shower unit Bathroom Toilet: Handicapped height Bathroom Accessibility: Yes How Accessible: Accessible via walker Home Adaptive Equipment: Walker - rolling;Shower chair without back Additional Comments: Pt. reports family can provide 24 hour assistance Prior Function Level of Independence: Independent with basic ADLs;Independent  with homemaking  with ambulation;Independent with gait Able to Take Stairs?: Yes Driving: Yes Vocation: Retired ADL ADL Eating/Feeding: Simulated;Independent Where Assessed - Eating/Feeding: Chair Grooming: Simulated;Wash/dry hands;Minimal assistance (min guard assist) Where Assessed - Grooming: Standing at sink Upper Body Bathing: Simulated;Set up Where Assessed - Upper Body Bathing: Unsupported;Sitting, bed;Sitting, chair Lower Body Bathing: Simulated;Minimal assistance Where Assessed - Lower Body Bathing: Sit to stand from chair;Sit to stand from bed Upper Body Dressing: Simulated;Set up Where Assessed - Upper Body Dressing: Unsupported;Sitting, chair;Sitting, bed Lower Body Dressing: Simulated;Minimal assistance Where Assessed - Lower Body Dressing: Sit to stand from chair;Sit to stand from bed Toilet Transfer: Performed;Minimal assistance Toilet Transfer Method: Ambulating Toilet Transfer Equipment: Comfort height toilet;Grab bars Toileting - Clothing Manipulation: Performed;Supervision/safety Where Assessed - Toileting Clothing Manipulation: Standing Toileting - Hygiene: Performed;Modified independent Where Assessed - Toileting Hygiene: Sit on 3-in-1 or toilet Equipment Used: Rolling walker Ambulation Related to ADLs: Pt. ambulated to BR with min guard assist.  Pt. fatigued and reports increased pain ADL Comments: Pt. able to access Rt. foot to pull up socks.  Pt. required cues and assist to extend rt. LE forward when moving stand to sit Vision/Perception    Cognition Cognition Arousal/Alertness: Awake/alert Overall Cognitive Status: Appears within functional limits for tasks assessed Orientation Level: Oriented X4 Sensation/Coordination   Extremity Assessment RUE Assessment RUE Assessment: Within Functional Limits LUE Assessment LUE Assessment: Within Functional Limits Mobility  Bed Mobility Bed Mobility: Yes Sit to Supine: 4: Min assist Transfers Transfers: Yes Sit to Stand: 4:  Min assist;From chair/3-in-1;From toilet;With upper extremity assist Sit to Stand Details (indicate cue type and reason): requires verbal cues to push from chair Stand to Sit: 4: Min assist Stand to Sit Details: requires assist to extens Rt. LE, and to reach back to surface Exercises   End of Session OT - End of Session Equipment Utilized During Treatment: Right knee immobilizer Activity Tolerance: Patient limited by pain;Patient limited by fatigue Patient left: in bed;with call bell in reach Nurse Communication: Mobility status for transfers;Mobility status for ambulation General Behavior During Session: [facility] for tasks performed Cognition: [facility] for tasks performed   Naithen Rivenburg, Ursula Alert M 12/15/2011, 11:11 AM

## 2011-12-15 NOTE — Progress Notes (Signed)
Subjective: 2 Days Post-Op Procedure(s) (LRB): TOTAL KNEE ARTHROPLASTY (Right) Patient reports pain as mild.   Patient seen in rounds with Dr. Darrelyn Hillock. Patient has complaints of mild pain, nausea, and urinary urgency. She states that she was able to get some sleep last night. She reports that therapy was difficult yesterday but went ok. She reports that even before surgery she had some difficulty with urinary urgency and was not making it to the restroom in time. She is concerned that with her knee in its current state she will not be able to get to and from the bathroom effectively. She originally reported that she had help at home and was to be discharged home but now she requests possible SNF placement due to urinary urgency issues complicated by immobility. She denies chest pain and shortness of breath.  Objective: Vital signs in last 24 hours: Temp:  [98.1 F (36.7 C)-98.7 F (37.1 C)] 98.4 F (36.9 C) (02/22 0620) Pulse Rate:  [81-107] 88  (02/22 0620) Resp:  [14-16] 16  (02/22 0620) BP: (102-119)/(61-70) 119/70 mmHg (02/22 0620) SpO2:  [82 %-98 %] 98 % (02/22 0620)  Intake/Output from previous day:  Intake/Output Summary (Last 24 hours) at 12/15/11 0859 Last data filed at 12/15/11 8119  Gross per 24 hour  Intake 1592.5 ml  Output   1750 ml  Net -157.5 ml     Labs:  Basename 12/15/11 0409 12/14/11 0400  HGB 11.2* 12.4    Basename 12/15/11 0409 12/14/11 0400  WBC 14.5* 11.4*  RBC 3.63* 4.02  HCT 34.6* 38.5  PLT 134* 161    Basename 12/15/11 0409 12/14/11 0400  NA 135 137  K 3.2* 3.6  CL 97 101  CO2 31 30  BUN 8 7  CREATININE 0.52 0.57  GLUCOSE 138* 125*  CALCIUM 9.5 9.1    Exam - Neurologically intact Neurovascular intact Sensation intact distally No cellulitis present Dressing/Incision - clean, dry, no drainage Motor function intact - moving foot and toes well on exam.  Pt complaining of tender right calf but Homan's sign negative and posterior tib and  dorsalis pedis pulses 2+ in R LE.   Past Medical History  Diagnosis Date  . Hypertension   . Pneumonia     hx of   . Arthritis     knees, hands   . Depression     Assessment/Plan: 2 Days Post-Op Procedure(s) (LRB): TOTAL KNEE ARTHROPLASTY (Right) Active Problems:  Osteoarthritis of right knee Post-op hypokalemia Post-op hyponatremia  Advance diet Up with therapy D/C IV fluids Plan for discharge tomorrow Discharge plan TBD once patient speaks with social worker  DVT Prophylaxis - Xarelto Protocol Weight-Bearing as tolerated to right leg  Mishal Probert LAUREN 12/15/2011, 8:59 AM

## 2011-12-15 NOTE — Progress Notes (Signed)
Comments:  12/15/2011 Raynelle Bring BSN CCM [phone] Pt is requesting 3n1; [facility] notified for dme request and will deliver to patient's room.

## 2011-12-15 NOTE — Progress Notes (Signed)
CSW met with pt this afternoon to assist with D/C planning. Pt has decided to return home with [facility] services. CSW ias available to assist with SNF placement if plan changes . RNCM has been consulted and will assist with d/c planning.

## 2011-12-15 NOTE — Progress Notes (Signed)
Physical Therapy Treatment Patient Details Name: [name] MRN: [id number] DOB: Mar 18, 1934 Today's Date: 12/15/2011  PT Assessment/Plan  PT - Assessment/Plan Comments on Treatment Session: pt with p=5 so had more difficulty attempting to incr. distance to amb.. per MD, plan to DC 12/17/11 PT Plan: Discharge plan remains appropriate PT Frequency: 7X/week Recommendations for Other Services: OT consult Follow Up Recommendations: Home health PT Equipment Recommended: 3 in 1 bedside comode PT Goals  Acute Rehab PT Goals PT Goal Formulation: With patient/family Time For Goal Achievement: 7 days Pt will go Supine/Side to Sit: with supervision PT Goal: Supine/Side to Sit - Progress: Progressing toward goal Pt will go Sit to Supine/Side: with supervision PT Goal: Sit to Supine/Side - Progress: Progressing toward goal Pt will go Sit to Stand: with supervision PT Goal: Sit to Stand - Progress: Progressing toward goal Pt will go Stand to Sit: with supervision PT Goal: Stand to Sit - Progress: Progressing toward goal Pt will Ambulate: 51 - 150 feet;with supervision;with rolling walker PT Goal: Ambulate - Progress: Progressing toward goal Pt will Perform Home Exercise Program: with supervision, verbal cues required/provided PT Goal: Perform Home Exercise Program - Progress: Progressing toward goal  PT Treatment Precautions/Restrictions  Precautions Precautions: Knee Required Braces or Orthoses: Yes Knee Immobilizer: Discontinue once straight leg raise with < 10 degree lag Restrictions Weight Bearing Restrictions: No Mobility (including Balance) Bed Mobility Bed Mobility: Yes Supine to Sit: 4: Min assist Sit to Supine: 4: Min assist Sit to Supine - Details (indicate cue type and reason): vc to get RLE onto bed with assist. Transfers Sit to Stand: 4: Min assist;From bed;From chair/3-in-1;With upper extremity assist Sit to Stand Details (indicate cue type and reason): requires verbal  cues to push from chair/bed Stand to Sit: 4: Min assist;To chair/3-in-1;With armrests;With upper extremity assist Stand to Sit Details: requires assist to extens Rt. LE, and to reach back to surface Stand Pivot Transfers: 4: Min assist Stand Pivot Transfer Details (indicate cue type and reason): vc to increase WB R Ambulation/Gait Ambulation/Gait Assistance: 4: Min assist Ambulation/Gait Assistance Details (indicate cue type and reason): v frequent vc for sequence,opt tends to take long step with R and past RW. vc to place more weight on RLE Ambulation Distance (Feet): 15 Feet (then 20) Assistive device: Rolling walker Gait Pattern: Step-to pattern Gait velocity: slow    Exercise  Pt declined due to opain End of Session PT - End of Session Equipment Utilized During Treatment: Right knee immobilizer Activity Tolerance: Patient limited by pain Patient left: with call bell in reach;in bed Nurse Communication: Mobility status for transfers (pain med) General Behavior During Session: [facility] for tasks performed Cognition: [facility] for tasks performed  Rada Hay 12/15/2011, 3:46 PM

## 2011-12-15 NOTE — Discharge Summary (Signed)
Physician Discharge Summary   Patient ID: [name] MRN: [id number] DOB/AGE: May 15, 1934 78 y.o.  Admit date: 12/13/2011 Discharge date: 12/15/2011  Primary Diagnosis: Osteoarthritis right knee  Admission Diagnoses: Past Medical History  Diagnosis Date  . Hypertension   . Pneumonia     hx of   . Arthritis     knees, hands   . Depression     Discharge Diagnoses:  Active Problems:  Osteoarthritis of right knee S/P Right total knee arthroplasty Post-op hypokalemia Post-op hyponatremia  Procedure: Procedure(s) (LRB): TOTAL KNEE ARTHROPLASTY (Right)   Consults: None  HPI: [name] has been followed by Dr. Darrelyn Hillock for her right knee pain. She is now 10 months out from injury when she fell. Symptoms reported included pain and instability. The patient felt that she was getting progressively worse. She had knee arthroscopy done 06/11/2009. Her knee has been getting progressively worse since a fall in March 2012. X-rays show bone on bone changes in the lateral and patellofemoral compartments of the right knee. Pt needs right total knee arthroplasty to decrease pain and increase function.    Laboratory Data: Hospital Outpatient Visit on 12/06/2011  Component Date Value Range Status  . aPTT (seconds) 12/06/2011 29  24-37 Final  . WBC (K/uL) 12/06/2011 19.2* 4.0-10.5 Final  . RBC (MIL/uL) 12/06/2011 4.97  3.87-5.11 Final  . Hemoglobin (g/dL) 40/98/1191 47.8* 29.5-62.1 Final  . HCT (%) 12/06/2011 47.1* 36.0-46.0 Final  . MCV (fL) 12/06/2011 94.8  78.0-100.0 Final  . MCH (pg) 12/06/2011 31.6  26.0-34.0 Final  . MCHC (g/dL) 30/86/5784 69.6  29.5-28.4 Final  . RDW (%) 12/06/2011 14.5  11.5-15.5 Final  . Platelets (K/uL) 12/06/2011 224  150-400 Final  . Sodium (mEq/L) 12/06/2011 141  135-145 Final  . Potassium (mEq/L) 12/06/2011 3.3* 3.5-5.1 Final  . Chloride (mEq/L) 12/06/2011 101  96-112 Final  . CO2 (mEq/L) 12/06/2011 26  19-32 Final  . Glucose, Bld (mg/dL) 13/24/4010 95   27-25 Final  . BUN (mg/dL) 36/64/4034 16  7-42 Final  . Creatinine, Ser (mg/dL) 59/56/3875 6.43  3.29-5.18 Final  . Calcium (mg/dL) 84/16/6063 01.6* 0.1-09.3 Final  . Total Protein (g/dL) 23/55/7322 7.5  0.2-5.4 Final  . Albumin (g/dL) 27/03/2375 3.6  2.8-3.1 Final  . AST (U/L) 12/06/2011 18  0-37 Final  . ALT (U/L) 12/06/2011 15  0-35 Final  . Alkaline Phosphatase (U/L) 12/06/2011 66  39-117 Final  . Total Bilirubin (mg/dL) 51/76/1607 0.3  3.7-1.0 Final  . GFR calc non Af Amer (mL/min) 12/06/2011 86* >90 Final  . GFR calc Af Amer (mL/min) 12/06/2011 >90  >90 Final   Comment:                                 The eGFR has been calculated                          using the CKD EPI equation.                          This calculation has not been                          validated in all clinical                          situations.  eGFR's persistently                          <90 mL/min signify                          possible Chronic Kidney Disease.  Marland Kitchen Neutrophils Relative (%) 12/06/2011 79* 43-77 Final  . Neutro Abs (K/uL) 12/06/2011 15.2* 1.7-7.7 Final  . Lymphocytes Relative (%) 12/06/2011 11* 12-46 Final  . Lymphs Abs (K/uL) 12/06/2011 2.1  0.7-4.0 Final  . Monocytes Relative (%) 12/06/2011 10  3-12 Final  . Monocytes Absolute (K/uL) 12/06/2011 1.9* 0.1-1.0 Final  . Eosinophils Relative (%) 12/06/2011 0  0-5 Final  . Eosinophils Absolute (K/uL) 12/06/2011 0.0  0.0-0.7 Final  . Basophils Relative (%) 12/06/2011 0  0-1 Final  . Basophils Absolute (K/uL) 12/06/2011 0.0  0.0-0.1 Final  . Prothrombin Time (seconds) 12/06/2011 12.8  11.6-15.2 Final  . INR  12/06/2011 0.94  0.00-1.49 Final  . Color, Urine  12/06/2011 YELLOW  YELLOW Final  . APPearance  12/06/2011 CLEAR  CLEAR Final  . Specific Gravity, Urine  12/06/2011 1.013  1.005-1.030 Final  . pH  12/06/2011 6.5  5.0-8.0 Final  . Glucose, UA (mg/dL) 41/32/4401 NEGATIVE  NEGATIVE Final  . Hgb urine  dipstick  12/06/2011 NEGATIVE  NEGATIVE Final  . Bilirubin Urine  12/06/2011 NEGATIVE  NEGATIVE Final  . Ketones, ur (mg/dL) 02/72/5366 NEGATIVE  NEGATIVE Final  . Protein, ur (mg/dL) 44/12/4740 NEGATIVE  NEGATIVE Final  . Urobilinogen, UA (mg/dL) 59/56/3875 0.2  6.4-3.3 Final  . Nitrite  12/06/2011 NEGATIVE  NEGATIVE Final  . Leukocytes, UA  12/06/2011 NEGATIVE  NEGATIVE Final   MICROSCOPIC NOT DONE ON URINES WITH NEGATIVE PROTEIN, BLOOD, LEUKOCYTES, NITRITE, OR GLUCOSE <1000 mg/dL.  Marland Kitchen MRSA, PCR  12/06/2011 NEGATIVE  NEGATIVE Final  . Staphylococcus aureus  12/06/2011 POSITIVE* NEGATIVE Final   Comment:                                 The Xpert SA Assay (FDA                          approved for NASAL specimens                          only), is one component of                          a comprehensive surveillance                          program.  It is not intended                          to diagnose infection nor to                          guide or monitor treatment.    Basename 12/15/11 0409 12/14/11 0400  HGB 11.2* 12.4    Basename 12/15/11 0409 12/14/11 0400  WBC 14.5* 11.4*  RBC 3.63* 4.02  HCT 34.6* 38.5  PLT 134* 161    Basename 12/15/11 0409 12/14/11 0400  NA 135 137  K 3.2* 3.6  CL 97 101  CO2 31 30  BUN 8 7  CREATININE 0.52 0.57  GLUCOSE 138* 125*  CALCIUM 9.5 9.1    X-Rays:X-ray Knee Right Port  12/13/2011  *RADIOLOGY REPORT*  Clinical Data: 78 year old female status post right total knee.  PORTABLE RIGHT KNEE - 1-2 VIEW  Comparison: None.  Findings: Portable AP and lateral views.  Sequelae of total knee arthroplasty.  Overlying skin staples.  Postoperative drain in place.  Hardware components appear intact and normally aligned.  No unexpected osseous or soft tissue findings identified.  IMPRESSION: Right total knee arthroplasty with no adverse features.  Original Report Authenticated By: Ulla Potash III, M.D.    EKG: Orders placed in visit on 12/13/11    . EKG 12-LEAD     Hospital Course: Patient was admitted to [facility] and taken to the OR and underwent the above state procedure without complications.  Patient tolerated the procedure well and was later transferred to the recovery room and then to the orthopaedic floor for postoperative care.  They were given PO and IV analgesics for pain control following their surgery.  They were given 24 hours of postoperative antibiotics and started on DVT prophylaxis.   PT and OT were ordered for total joint protocol.  Discharge planning consulted to help with postop disposition and equipment needs.  Patient had an ok night on the evening of surgery and started to get up with therapy on day one. Therapy was slow in the morning but improved in the afternoon. Hemovac drain was pulled without difficulty.  Continued to progress with therapy into day two.  Dressing was changed on day two and the incision was clean, dry, and no drainage. Pt developed post-op hypokalemia and was discharged with Klor-con. Patient discharge set up for home.    Discharge Medications: Prior to Admission medications   Medication Sig Start Date End Date Taking? Authorizing Provider  hydrochlorothiazide (HYDRODIURIL) 25 MG tablet Take 12.5 mg by mouth daily after breakfast.   Yes Historical Provider, MD  lovastatin (MEVACOR) 40 MG tablet Take 40 mg by mouth every evening.   Yes Historical Provider, MD  olmesartan (BENICAR) 20 MG tablet Take 20 mg by mouth daily after breakfast.   Yes Historical Provider, MD  venlafaxine (EFFEXOR-XR) 75 MG 24 hr capsule Take 75 mg by mouth daily after breakfast.   Yes Historical Provider, MD  methocarbamol (ROBAXIN) 500 MG tablet Take 1 tablet (500 mg total) by mouth every 6 (six) hours as needed. 12/15/11 12/25/11  Amber Tamala Ser, PA  oxyCODONE-acetaminophen (PERCOCET) 5-325 MG per tablet Take 1-2 tablets by mouth every 4 (four) hours as needed. 12/15/11 12/25/11  Amber Tamala Ser, PA   potassium chloride (KLOR-CON) 20 MEQ packet Take 20 mEq by mouth 2 (two) times daily. 12/15/11 12/14/12  Amber Tamala Ser, PA    Diet: regular  Activity:WBAT  Follow-up:in 2 weeks  Disposition: home  Discharged Condition: good   Discharge Orders    Future Appointments: Provider: Department: Dept Phone: Center:   01/15/2012 9:45 AM [facility] [phone]     Future Orders Please Complete By Expires   Diet - low sodium heart healthy      Call MD / Call 911      Comments:   If you experience chest pain or shortness of breath, CALL 911 and be transported to the hospital emergency room.  If you develope a fever above 101 F, pus (white drainage) or increased drainage or redness at the  wound, or calf pain, call your surgeon's office.   Constipation Prevention      Comments:   Drink plenty of fluids.  Prune juice may be helpful.  You may use a stool softener, such as Colace (over the counter) 100 mg twice a day.  Use MiraLax (over the counter) for constipation as needed.   Increase activity slowly as tolerated      Weight Bearing as taught in Physical Therapy      Comments:   Use a walker or crutches as instructed.   Discharge instructions      Comments:   Walk with your walker. Weight bearing as instructed. Home Health Agency will follow you at home for your therapy  Change your dressing daily. Shower only, no tub bath. Call if any temperatures greater than 101 or any wound complications: [phone] during the day and ask for Dr. Jeannetta Ellis nurse, [facility] [place].   Driving restrictions      Comments:   No driving   Lifting restrictions      Comments:   No lifting   Change dressing      Comments:   Change dressing daily with sterile 4 x 4 inch gauze dressing.   Do not put a pillow under the knee. Place it under the heel.        Medication List  As of 12/15/2011 10:26 AM   STOP taking these medications         aspirin EC 81 MG tablet      CALCIUM CITRATE + D  PO      [employer] SPECTRAVITE PO      naproxen sodium 220 MG tablet      PRESERVISION AREDS PO      TYLENOL ARTHRITIS PAIN 650 MG CR tablet      Vitamin D 2000 UNITS Caps         TAKE these medications         hydrochlorothiazide 25 MG tablet   Commonly known as: HYDRODIURIL   Take 12.5 mg by mouth daily after breakfast.      lovastatin 40 MG tablet   Commonly known as: MEVACOR   Take 40 mg by mouth every evening.      methocarbamol 500 MG tablet   Commonly known as: ROBAXIN   Take 1 tablet (500 mg total) by mouth every 6 (six) hours as needed.      olmesartan 20 MG tablet   Commonly known as: BENICAR   Take 20 mg by mouth daily after breakfast.      oxyCODONE-acetaminophen 5-325 MG per tablet   Commonly known as: PERCOCET   Take 1-2 tablets by mouth every 4 (four) hours as needed.      potassium chloride 20 MEQ packet   Commonly known as: KLOR-CON   Take 20 mEq by mouth 2 (two) times daily.      venlafaxine 75 MG 24 hr capsule   Commonly known as: EFFEXOR-XR   Take 75 mg by mouth daily after breakfast.           Pt can resume herbal supplements and vitamins once Xarelto is completed (3 weeks after day of surgery).   Signed: CONSTABLE, AMBER LAUREN 12/15/2011, 10:26 AM

## 2011-12-16 LAB — CBC
MCH: 30.9 pg (ref 26.0–34.0)
MCHC: 32.4 g/dL (ref 30.0–36.0)
MCV: 95.4 fL (ref 78.0–100.0)
Platelets: 154 10*3/uL (ref 150–400)
RBC: 3.27 MIL/uL — ABNORMAL LOW (ref 3.87–5.11)
RDW: 14.4 % (ref 11.5–15.5)

## 2011-12-16 NOTE — Progress Notes (Signed)
[name] spoke with pt concerning d/c planning. Per pt [name] to provide HHPT. CM to contact  [name] repAmil Amen at [phone] to alert agency of d/c. Per pt, has DME. Pt's adult children to provide tx home adn assist in home care.    [name] [phone]

## 2011-12-16 NOTE — Progress Notes (Signed)
Patient discharged to home. DC instructions given with dtr at bedside. Prescriptions x 4 also given. No concerns voiced. Left unit in wheelchair pushed by nurse tech. Left in good condition

## 2011-12-16 NOTE — Progress Notes (Signed)
Physical Therapy Treatment Patient Details Name: [name] MRN: [id number] DOB: [birth year] Today's Date: 12/16/2011  PT Assessment/Plan  PT - Assessment/Plan Comments on Treatment Session: Pt able to perform stairs today with minA and given handout.  Pt plans to d/c home today and had no questions or concerns. PT Plan: Discharge plan remains appropriate Follow Up Recommendations: Home health PT Equipment Recommended: 3 in 1 bedside comode PT Goals  Acute Rehab PT Goals PT Goal: Sit to Stand - Progress: Progressing toward goal PT Goal: Stand to Sit - Progress: Progressing toward goal PT Goal: Ambulate - Progress: Progressing toward goal PT Goal: Up/Down Stairs - Progress: Met  PT Treatment Precautions/Restrictions  Precautions Precautions: Knee Required Braces or Orthoses: Yes Knee Immobilizer: Discontinue once straight leg raise with < 10 degree lag Restrictions Weight Bearing Restrictions: No Mobility (including Balance) Bed Mobility Bed Mobility: No Transfers Transfers: Yes Sit to Stand: 4: Min assist;With upper extremity assist;From chair/3-in-1 Sit to Stand Details (indicate cue type and reason): min/guard, verbal cues for hand placement Stand to Sit: 4: Min assist;To chair/3-in-1;With armrests;With upper extremity assist Stand to Sit Details: min/guard, verbal cues for armrests and R LE forward Ambulation/Gait Ambulation/Gait: Yes Ambulation/Gait Assistance: 4: Min assist Ambulation/Gait Assistance Details (indicate cue type and reason): min/guard Ambulation Distance (Feet): 25 Feet Assistive device: Rolling walker Gait Pattern: Step-to pattern Gait velocity: slow Stairs: Yes Stairs Assistance: 4: Min assist Stairs Assistance Details (indicate cue type and reason): verbal cues for safe technique, handout given, pt also educated on person to assist's role for securing RW, pt reported nausea upon finishing ascending and required seated rest break at top of stairs  before continuing with descending Stair Management Technique: Backwards;With walker;Step to pattern Number of Stairs: 4     Exercise    End of Session PT - End of Session Equipment Utilized During Treatment: Right knee immobilizer;Gait belt Activity Tolerance: Patient limited by fatigue Patient left: in chair;with call bell in reach General Behavior During Session: [facility] for tasks performed Cognition: [facility] for tasks performed  Dimitra Woodstock,KATHrine E 12/16/2011, 10:39 AM

## 2011-12-16 NOTE — Progress Notes (Signed)
Occupational Therapy Treatment Patient Details Name: [name] MRN: [id number] DOB: 1934-05-18 Today's Date: 12/16/2011  OT Assessment/Plan OT Assessment/Plan OT Frequency: Min 2X/week Follow Up Recommendations: Home health OT Equipment Recommended: 3 in 1 bedside comode OT Goals ADL Goals Pt Will Perform Grooming: with supervision;Standing at sink ADL Goal: Grooming - Progress: Met Pt Will Perform Lower Body Bathing: with supervision;Sit to stand from chair ADL Goal: Lower Body Bathing - Progress: Progressing toward goals Pt Will Transfer to Toilet: with supervision;Comfort height toilet;Ambulation ADL Goal: Toilet Transfer - Progress: Progressing toward goals  OT Treatment Precautions/Restrictions  Precautions Precautions: Knee Required Braces or Orthoses: Yes Knee Immobilizer: Discontinue once straight leg raise with < 10 degree lag Restrictions Weight Bearing Restrictions: No   ADL ADL Grooming: Performed;Teeth care;Supervision/safety Where Assessed - Grooming: Standing at sink Lower Body Bathing: Performed;Minimal assistance Where Assessed - Lower Body Bathing: Sit to stand from chair;Other (comment) (commode) Toilet Transfer: Performed;Minimal assistance (min guard walking min A for sit to stand from commode) Toilet Transfer Details (indicate cue type and reason): min guard  Toilet Transfer Method: Proofreader: Comfort height toilet Toileting - Hygiene: Performed;Modified independent Where Assessed - Toileting Hygiene: Sit on 3-in-1 or toilet Equipment Used: Rolling walker Ambulation Related to ADLs: Pt. ambulated to BR with min guard assist.  Pt. fatigued and reports increased pain Mobility  Bed Mobility Bed Mobility: No Supine to Sit: 4: Min assist Transfers Sit to Stand: 4: Min assist;With upper extremity assist;From chair/3-in-1 Sit to Stand Details (indicate cue type and reason): min/guard, verbal cues for hand placement Stand  to Sit: 4: Min assist;To chair/3-in-1;With armrests;With upper extremity assist Stand to Sit Details: min/guard, verbal cues for armrests and R LE forward Exercises    End of Session OT - End of Session Equipment Utilized During Treatment: Right knee immobilizer Activity Tolerance: Patient limited by pain;Patient limited by fatigue Patient left: in chair;with call bell in reach General Behavior During Session: [facility] for tasks performed Cognition: [facility] for tasks performed [facility], OTR/L [phone] 12/16/2011 [name]  12/16/2011, 10:38 AM

## 2011-12-16 NOTE — Progress Notes (Signed)
Subjective: Doing well today. Will DC today.   Objective: Vital signs in last 24 hours: Temp:  [97.8 F (36.6 C)-99.6 F (37.6 C)] 97.8 F (36.6 C) (02/23 0600) Pulse Rate:  [66-93] 80  (02/23 0600) Resp:  [16-18] 18  (02/23 0600) BP: (88-112)/(49-68) 104/68 mmHg (02/23 0600) SpO2:  [82 %-96 %] 95 % (02/23 0600)  Intake/Output from previous day: 02/22 0701 - 02/23 0700 In: 600 [P.O.:600] Out: 950 [Urine:950] Intake/Output this shift:     Basename 12/16/11 0430 12/15/11 0409 12/14/11 0400  HGB 10.1* 11.2* 12.4    Basename 12/16/11 0430 12/15/11 0409  WBC 16.5* 14.5*  RBC 3.27* 3.63*  HCT 31.2* 34.6*  PLT 154 134*    Basename 12/15/11 0409 12/14/11 0400  NA 135 137  K 3.2* 3.6  CL 97 101  CO2 31 30  BUN 8 7  CREATININE 0.52 0.57  GLUCOSE 138* 125*  CALCIUM 9.5 9.1   No results found for this basename: LABPT:2,INR:2 in the last 72 hours  Neurologically intact Dorsiflexion/Plantar flexion intact  Assessment/Plan: DC Today and office in two weeks or before if any problems.   [name] A 12/16/2011, 7:42 AM

## 2011-12-25 ENCOUNTER — Encounter (HOSPITAL_COMMUNITY): Payer: Self-pay | Admitting: Orthopedic Surgery

## 2012-01-15 ENCOUNTER — Ambulatory Visit (HOSPITAL_COMMUNITY): Payer: Medicare Other

## 2012-10-02 ENCOUNTER — Other Ambulatory Visit (HOSPITAL_COMMUNITY): Payer: Self-pay | Admitting: Family Medicine

## 2012-10-02 DIAGNOSIS — M810 Age-related osteoporosis without current pathological fracture: Secondary | ICD-10-CM

## 2012-10-07 ENCOUNTER — Ambulatory Visit (HOSPITAL_COMMUNITY)
Admission: RE | Admit: 2012-10-07 | Discharge: 2012-10-07 | Disposition: A | Discharge status: Home / Self Care | Payer: Medicare Other | Source: Ambulatory Visit | Attending: Family Medicine | Admitting: Family Medicine

## 2012-10-07 ENCOUNTER — Other Ambulatory Visit (HOSPITAL_COMMUNITY): Payer: Self-pay | Admitting: Family Medicine

## 2012-10-07 DIAGNOSIS — Z780 Asymptomatic menopausal state: Secondary | ICD-10-CM | POA: Insufficient documentation

## 2012-10-07 DIAGNOSIS — M810 Age-related osteoporosis without current pathological fracture: Secondary | ICD-10-CM

## 2012-10-07 DIAGNOSIS — Z13820 Encounter for screening for osteoporosis: Secondary | ICD-10-CM | POA: Insufficient documentation

## 2013-03-05 ENCOUNTER — Ambulatory Visit (HOSPITAL_COMMUNITY): Payer: Medicare Other | Admitting: Anesthesiology

## 2013-03-05 ENCOUNTER — Inpatient Hospital Stay (HOSPITAL_COMMUNITY)
Admission: AD | Admit: 2013-03-05 | Discharge: 2013-03-07 | DRG: 502 | Disposition: A | Discharge status: Home / Self Care | Payer: Medicare Other | Source: Ambulatory Visit | Attending: Orthopedic Surgery | Admitting: Orthopedic Surgery

## 2013-03-05 ENCOUNTER — Encounter (HOSPITAL_COMMUNITY): Payer: Self-pay | Admitting: *Deleted

## 2013-03-05 ENCOUNTER — Other Ambulatory Visit (INDEPENDENT_AMBULATORY_CARE_PROVIDER_SITE_OTHER): Payer: Self-pay | Admitting: Physician Assistant

## 2013-03-05 ENCOUNTER — Encounter (HOSPITAL_COMMUNITY): Payer: Self-pay | Admitting: Anesthesiology

## 2013-03-05 ENCOUNTER — Ambulatory Visit (HOSPITAL_COMMUNITY): Payer: Medicare Other

## 2013-03-05 ENCOUNTER — Encounter (HOSPITAL_COMMUNITY)
Admission: AD | Disposition: A | Discharge status: Home / Self Care | Payer: Self-pay | Source: Ambulatory Visit | Attending: Orthopedic Surgery

## 2013-03-05 DIAGNOSIS — E785 Hyperlipidemia, unspecified: Secondary | ICD-10-CM | POA: Diagnosis present

## 2013-03-05 DIAGNOSIS — I10 Essential (primary) hypertension: Secondary | ICD-10-CM | POA: Diagnosis present

## 2013-03-05 DIAGNOSIS — Z96649 Presence of unspecified artificial hip joint: Secondary | ICD-10-CM

## 2013-03-05 DIAGNOSIS — F3289 Other specified depressive episodes: Secondary | ICD-10-CM | POA: Diagnosis present

## 2013-03-05 DIAGNOSIS — M7020 Olecranon bursitis, unspecified elbow: Principal | ICD-10-CM | POA: Diagnosis present

## 2013-03-05 DIAGNOSIS — F17200 Nicotine dependence, unspecified, uncomplicated: Secondary | ICD-10-CM | POA: Diagnosis present

## 2013-03-05 DIAGNOSIS — Z96659 Presence of unspecified artificial knee joint: Secondary | ICD-10-CM

## 2013-03-05 DIAGNOSIS — F329 Major depressive disorder, single episode, unspecified: Secondary | ICD-10-CM | POA: Diagnosis present

## 2013-03-05 DIAGNOSIS — H5440 Blindness, one eye, unspecified eye: Secondary | ICD-10-CM | POA: Diagnosis present

## 2013-03-05 DIAGNOSIS — M129 Arthropathy, unspecified: Secondary | ICD-10-CM | POA: Diagnosis present

## 2013-03-05 HISTORY — PX: I & D EXTREMITY: SHX5045

## 2013-03-05 LAB — COMPREHENSIVE METABOLIC PANEL
ALT: 18 U/L (ref 0–35)
Alkaline Phosphatase: 62 U/L (ref 39–117)
BUN: 19 mg/dL (ref 6–23)
CO2: 29 mEq/L (ref 19–32)
GFR calc Af Amer: >90 mL/min (ref >90)
GFR calc non Af Amer: 80 mL/min — ABNORMAL LOW (ref >90)
Glucose, Bld: 86 mg/dL (ref 70–99)
Potassium: 3.8 mEq/L (ref 3.5–5.1)
Sodium: 140 mEq/L (ref 135–145)
Total Bilirubin: 0.3 mg/dL (ref 0.3–1.2)
Total Protein: 6.8 g/dL (ref 6.0–8.3)

## 2013-03-05 LAB — CBC WITH DIFFERENTIAL/PLATELET
Eosinophils Absolute: 0.2 10*3/uL (ref 0.0–0.7)
Hemoglobin: 16 g/dL — ABNORMAL HIGH (ref 12.0–15.0)
Lymphocytes Relative: 21 % (ref 12–46)
Lymphs Abs: 2.3 10*3/uL (ref 0.7–4.0)
MCH: 31.8 pg (ref 26.0–34.0)
MCV: 94.6 fL (ref 78.0–100.0)
Monocytes Relative: 8 % (ref 3–12)
Neutrophils Relative %: 69 % (ref 43–77)
Platelets: 203 10*3/uL (ref 150–400)
RBC: 5.03 MIL/uL (ref 3.87–5.11)
WBC: 11 10*3/uL — ABNORMAL HIGH (ref 4.0–10.5)

## 2013-03-05 LAB — PROTIME-INR: Prothrombin Time: 12.8 seconds (ref 11.6–15.2)

## 2013-03-05 SURGERY — IRRIGATION AND DEBRIDEMENT EXTREMITY
Anesthesia: General | Site: Elbow | Laterality: Left | Wound class: Dirty or Infected

## 2013-03-05 MED ORDER — DOCUSATE SODIUM 100 MG PO CAPS
100.0000 mg | ORAL_CAPSULE | Freq: Two times a day (BID) | ORAL | Status: DC
  Administered 2013-03-06 – 2013-03-07 (×3): 100 mg via ORAL
  Filled 2013-03-05 (×5): qty 1

## 2013-03-05 MED ORDER — LIDOCAINE HCL (CARDIAC) 20 MG/ML IV SOLN
INTRAVENOUS | Status: DC | PRN
  Administered 2013-03-05: 80 mg via INTRAVENOUS

## 2013-03-05 MED ORDER — EPHEDRINE SULFATE 50 MG/ML IJ SOLN
INTRAMUSCULAR | Status: DC | PRN
  Administered 2013-03-05: 10 mg via INTRAVENOUS
  Administered 2013-03-05: 5 mg via INTRAVENOUS
  Administered 2013-03-05: 10 mg via INTRAVENOUS

## 2013-03-05 MED ORDER — SIMVASTATIN 20 MG PO TABS
20.0000 mg | ORAL_TABLET | Freq: Every day | ORAL | Status: DC
  Administered 2013-03-06 (×2): 20 mg via ORAL
  Filled 2013-03-05 (×4): qty 1

## 2013-03-05 MED ORDER — ONDANSETRON HCL 4 MG PO TABS: 4.0000 mg | ORAL_TABLET | Freq: Four times a day (QID) | ORAL | Status: DC | PRN

## 2013-03-05 MED ORDER — MORPHINE SULFATE 2 MG/ML IJ SOLN: 1.0000 mg | INTRAMUSCULAR | Status: DC | PRN

## 2013-03-05 MED ORDER — ADULT MULTIVITAMIN W/MINERALS CH
1.0000 | ORAL_TABLET | Freq: Every morning | ORAL | Status: DC
  Administered 2013-03-06 – 2013-03-07 (×2): 1 via ORAL
  Filled 2013-03-05 (×2): qty 1

## 2013-03-05 MED ORDER — METOCLOPRAMIDE HCL 5 MG/ML IJ SOLN: 10.0000 mg | Freq: Once | INTRAMUSCULAR | Status: DC | PRN

## 2013-03-05 MED ORDER — ONDANSETRON HCL 4 MG/2ML IJ SOLN: 4.0000 mg | Freq: Four times a day (QID) | INTRAMUSCULAR | Status: DC | PRN

## 2013-03-05 MED ORDER — IRBESARTAN 150 MG PO TABS
150.0000 mg | ORAL_TABLET | Freq: Every day | ORAL | Status: DC
  Administered 2013-03-07: 150 mg via ORAL
  Filled 2013-03-05 (×2): qty 1

## 2013-03-05 MED ORDER — LACTATED RINGERS IV SOLN
INTRAVENOUS | Status: DC | PRN
  Administered 2013-03-05 (×2): via INTRAVENOUS

## 2013-03-05 MED ORDER — CHLORHEXIDINE GLUCONATE 4 % EX LIQD: 60.0000 mL | Freq: Once | CUTANEOUS | Status: DC

## 2013-03-05 MED ORDER — VANCOMYCIN HCL 1000 MG IV SOLR
1000.0000 mg | INTRAVENOUS | Status: DC | PRN
  Administered 2013-03-05: 1000 mg via INTRAVENOUS

## 2013-03-05 MED ORDER — HYDROCHLOROTHIAZIDE 25 MG PO TABS
12.5000 mg | ORAL_TABLET | Freq: Every day | ORAL | Status: DC
  Administered 2013-03-06: 12.5 mg via ORAL
  Filled 2013-03-05 (×2): qty 0.5

## 2013-03-05 MED ORDER — PROPOFOL 10 MG/ML IV BOLUS
INTRAVENOUS | Status: DC | PRN
  Administered 2013-03-05: 200 mg via INTRAVENOUS

## 2013-03-05 MED ORDER — METHOCARBAMOL 100 MG/ML IJ SOLN
500.0000 mg | Freq: Four times a day (QID) | INTRAVENOUS | Status: DC | PRN
  Filled 2013-03-05: qty 5

## 2013-03-05 MED ORDER — METHOCARBAMOL 500 MG PO TABS: 500.0000 mg | ORAL_TABLET | Freq: Four times a day (QID) | ORAL | Status: DC | PRN

## 2013-03-05 MED ORDER — SUCCINYLCHOLINE CHLORIDE 20 MG/ML IJ SOLN
INTRAMUSCULAR | Status: DC | PRN
  Administered 2013-03-05: 100 mg via INTRAVENOUS

## 2013-03-05 MED ORDER — SODIUM CHLORIDE 0.9 % IR SOLN
Status: DC | PRN
  Administered 2013-03-05: 3000 mL

## 2013-03-05 MED ORDER — VITAMIN C 500 MG PO TABS
1000.0000 mg | ORAL_TABLET | Freq: Every day | ORAL | Status: DC
  Administered 2013-03-06 – 2013-03-07 (×2): 1000 mg via ORAL
  Filled 2013-03-05 (×2): qty 2

## 2013-03-05 MED ORDER — VANCOMYCIN HCL 500 MG IV SOLR
500.0000 mg | Freq: Two times a day (BID) | INTRAVENOUS | Status: DC
  Administered 2013-03-06 – 2013-03-07 (×3): 500 mg via INTRAVENOUS
  Filled 2013-03-05 (×4): qty 500

## 2013-03-05 MED ORDER — MUPIROCIN 2 % EX OINT
TOPICAL_OINTMENT | Freq: Once | CUTANEOUS | Status: AC
  Administered 2013-03-05: 18:00:00 via NASAL
  Filled 2013-03-05 (×2): qty 22

## 2013-03-05 MED ORDER — DIPHENHYDRAMINE HCL 25 MG PO CAPS: 25.0000 mg | ORAL_CAPSULE | Freq: Four times a day (QID) | ORAL | Status: DC | PRN

## 2013-03-05 MED ORDER — VENLAFAXINE HCL ER 75 MG PO CP24
75.0000 mg | ORAL_CAPSULE | Freq: Every day | ORAL | Status: DC
  Administered 2013-03-06 – 2013-03-07 (×2): 75 mg via ORAL
  Filled 2013-03-05 (×3): qty 1

## 2013-03-05 MED ORDER — ACETAMINOPHEN 10 MG/ML IV SOLN: 1000.0000 mg | Freq: Once | INTRAVENOUS | Status: DC | PRN

## 2013-03-05 MED ORDER — ALPRAZOLAM 0.25 MG PO TABS: 0.2500 mg | ORAL_TABLET | Freq: Every day | ORAL | Status: DC | PRN

## 2013-03-05 MED ORDER — METOCLOPRAMIDE HCL 5 MG/ML IJ SOLN
INTRAMUSCULAR | Status: DC | PRN
  Administered 2013-03-05: 10 mg via INTRAVENOUS

## 2013-03-05 MED ORDER — HYDROCODONE-ACETAMINOPHEN 5-325 MG PO TABS: 1.0000 | ORAL_TABLET | ORAL | Status: DC | PRN

## 2013-03-05 MED ORDER — MIDAZOLAM HCL 5 MG/5ML IJ SOLN
INTRAMUSCULAR | Status: DC | PRN
  Administered 2013-03-05: 1 mg via INTRAVENOUS

## 2013-03-05 MED ORDER — GLYCOPYRROLATE 0.2 MG/ML IJ SOLN
INTRAMUSCULAR | Status: DC | PRN
  Administered 2013-03-05: 0.2 mg via INTRAVENOUS

## 2013-03-05 MED ORDER — FENTANYL CITRATE 0.05 MG/ML IJ SOLN: 25.0000 ug | INTRAMUSCULAR | Status: DC | PRN

## 2013-03-05 MED ORDER — FENTANYL CITRATE 0.05 MG/ML IJ SOLN
INTRAMUSCULAR | Status: DC | PRN
  Administered 2013-03-05 (×2): 50 ug via INTRAVENOUS

## 2013-03-05 MED ORDER — ONDANSETRON HCL 4 MG/2ML IJ SOLN
INTRAMUSCULAR | Status: DC | PRN
  Administered 2013-03-05: 4 mg via INTRAVENOUS

## 2013-03-05 MED ORDER — OXYCODONE-ACETAMINOPHEN 5-325 MG PO TABS
1.0000 | ORAL_TABLET | ORAL | Status: DC | PRN
  Administered 2013-03-06: 1 via ORAL
  Filled 2013-03-05: qty 1

## 2013-03-05 SURGICAL SUPPLY — 57 items
BANDAGE CONFORM 2  STR LF (GAUZE/BANDAGES/DRESSINGS) IMPLANT
BANDAGE ELASTIC 3 VELCRO ST LF (GAUZE/BANDAGES/DRESSINGS) ×2 IMPLANT
BANDAGE ELASTIC 4 VELCRO ST LF (GAUZE/BANDAGES/DRESSINGS) ×1 IMPLANT
BANDAGE GAUZE ELAST BULKY 4 IN (GAUZE/BANDAGES/DRESSINGS) ×1 IMPLANT
BNDG CMPR 9X4 STRL LF SNTH (GAUZE/BANDAGES/DRESSINGS) ×1
BNDG COHESIVE 1X5 TAN STRL LF (GAUZE/BANDAGES/DRESSINGS) IMPLANT
BNDG ESMARK 4X9 LF (GAUZE/BANDAGES/DRESSINGS) ×2 IMPLANT
CLOTH BEACON ORANGE TIMEOUT ST (SAFETY) ×2 IMPLANT
CORDS BIPOLAR (ELECTRODE) ×2 IMPLANT
COVER SURGICAL LIGHT HANDLE (MISCELLANEOUS) ×2 IMPLANT
CUFF TOURNIQUET SINGLE 18IN (TOURNIQUET CUFF) ×2 IMPLANT
CUFF TOURNIQUET SINGLE 24IN (TOURNIQUET CUFF) IMPLANT
DRAIN PENROSE 1/4X12 LTX STRL (WOUND CARE) IMPLANT
DRAPE SURG 17X23 STRL (DRAPES) ×2 IMPLANT
DRSG ADAPTIC 3X8 NADH LF (GAUZE/BANDAGES/DRESSINGS) ×2 IMPLANT
ELECT REM PT RETURN 9FT ADLT (ELECTROSURGICAL)
ELECTRODE REM PT RTRN 9FT ADLT (ELECTROSURGICAL) IMPLANT
GAUZE XEROFORM 1X8 LF (GAUZE/BANDAGES/DRESSINGS) ×1 IMPLANT
GAUZE XEROFORM 5X9 LF (GAUZE/BANDAGES/DRESSINGS) IMPLANT
GLOVE BIOGEL PI IND STRL 8.5 (GLOVE) ×1 IMPLANT
GLOVE BIOGEL PI INDICATOR 8.5 (GLOVE) ×1
GLOVE SURG ORTHO 8.0 STRL STRW (GLOVE) ×2 IMPLANT
GOWN PREVENTION PLUS XLARGE (GOWN DISPOSABLE) ×2 IMPLANT
GOWN STRL NON-REIN LRG LVL3 (GOWN DISPOSABLE) ×4 IMPLANT
HANDPIECE INTERPULSE COAX TIP (DISPOSABLE) ×2
KIT BASIN OR (CUSTOM PROCEDURE TRAY) ×2 IMPLANT
KIT ROOM TURNOVER OR (KITS) ×2 IMPLANT
LOOP VESSEL MAXI BLUE (MISCELLANEOUS) ×1 IMPLANT
MANIFOLD NEPTUNE II (INSTRUMENTS) ×1 IMPLANT
NDL HYPO 25GX1X1/2 BEV (NEEDLE) IMPLANT
NEEDLE HYPO 25GX1X1/2 BEV (NEEDLE) IMPLANT
NS IRRIG 1000ML POUR BTL (IV SOLUTION) ×1 IMPLANT
PACK ORTHO EXTREMITY (CUSTOM PROCEDURE TRAY) ×2 IMPLANT
PAD ARMBOARD 7.5X6 YLW CONV (MISCELLANEOUS) ×3 IMPLANT
PAD CAST 3X4 CTTN HI CHSV (CAST SUPPLIES) IMPLANT
PAD CAST 4YDX4 CTTN HI CHSV (CAST SUPPLIES) ×1 IMPLANT
PADDING CAST COTTON 3X4 STRL (CAST SUPPLIES) ×4
PADDING CAST COTTON 4X4 STRL (CAST SUPPLIES)
SET HNDPC FAN SPRY TIP SCT (DISPOSABLE) IMPLANT
SOAP 2 % CHG 4 OZ (WOUND CARE) ×2 IMPLANT
SPLINT FIBERGLASS 3X35 (CAST SUPPLIES) ×1 IMPLANT
SPONGE GAUZE 4X4 12PLY (GAUZE/BANDAGES/DRESSINGS) ×1 IMPLANT
SPONGE LAP 18X18 X RAY DECT (DISPOSABLE) ×1 IMPLANT
SPONGE LAP 4X18 X RAY DECT (DISPOSABLE) ×1 IMPLANT
SUCTION FRAZIER TIP 10 FR DISP (SUCTIONS) ×2 IMPLANT
SUT ETHILON 4 0 PS 2 18 (SUTURE) IMPLANT
SUT ETHILON 5 0 P 3 18 (SUTURE)
SUT NYLON ETHILON 5-0 P-3 1X18 (SUTURE) ×1 IMPLANT
SUT PROLENE 3 0 PS 2 (SUTURE) ×2 IMPLANT
SYR CONTROL 10ML LL (SYRINGE) IMPLANT
TOWEL OR 17X24 6PK STRL BLUE (TOWEL DISPOSABLE) ×2 IMPLANT
TOWEL OR 17X26 10 PK STRL BLUE (TOWEL DISPOSABLE) ×2 IMPLANT
TUBE ANAEROBIC SPECIMEN COL (MISCELLANEOUS) ×1 IMPLANT
TUBE CONNECTING 12X1/4 (SUCTIONS) ×2 IMPLANT
UNDERPAD 30X30 INCONTINENT (UNDERPADS AND DIAPERS) ×2 IMPLANT
WATER STERILE IRR 1000ML POUR (IV SOLUTION) ×1 IMPLANT
YANKAUER SUCT BULB TIP NO VENT (SUCTIONS) ×2 IMPLANT

## 2013-03-05 NOTE — Brief Op Note (Signed)
03/05/2013  9:08 PM  PATIENT:  [name]  79 y.o. female  PRE-OPERATIVE DIAGNOSIS:  Infected  Left Elbow  POST-OPERATIVE DIAGNOSIS:  SEPTIC OLECRANON BURSITIS   PROCEDURE:  Procedure(s): I&D Olecranon Bursa/Left Elbow (Left)  SURGEON:  Surgeon(s) and Role:    * Sharma Covert, MD - Primary  PHYSICIAN ASSISTANT:   ASSISTANTS: none   ANESTHESIA:   general  EBL:     BLOOD ADMINISTERED:none  DRAINS: none   LOCAL MEDICATIONS USED:  NONE  SPECIMEN:  No Specimen  DISPOSITION OF SPECIMEN:  N/A  COUNTS:  YES  TOURNIQUET:    DICTATION: [id number]  PLAN OF CARE: Admit for overnight observation  PATIENT DISPOSITION:  PACU - hemodynamically stable.   Delay start of Pharmacological VTE agent (>24hrs) due to surgical blood loss or risk of bleeding: not applicable

## 2013-03-05 NOTE — Anesthesia Procedure Notes (Signed)
Procedure Name: Intubation Date/Time: 03/05/2013 9:28 PM Performed by: Molli Hazard Pre-anesthesia Checklist: Patient identified, Emergency Drugs available, Suction available and Patient being monitored Patient Re-evaluated:Patient Re-evaluated prior to inductionOxygen Delivery Method: Circle system utilized Preoxygenation: Pre-oxygenation with 100% oxygen Intubation Type: IV induction, Rapid sequence and Cricoid Pressure applied Laryngoscope Size: Miller and 2 Grade View: Grade II Tube size: 7.5 mm Number of attempts: 1 Airway Equipment and Method: Stylet Placement Confirmation: ETT inserted through vocal cords under direct vision,  positive ETCO2 and breath sounds checked- equal and bilateral Secured at: 21 cm Tube secured with: Tape Dental Injury: Teeth and Oropharynx as per pre-operative assessment

## 2013-03-05 NOTE — Anesthesia Preprocedure Evaluation (Addendum)
Anesthesia Evaluation  Patient identified by MRN, date of birth, ID band Patient awake    Reviewed: Allergy & Precautions, H&P , NPO status , Patient's Chart, lab work & pertinent test results, reviewed documented beta blocker date and time   Airway Mallampati: II TM Distance: >3 FB Neck ROM: full    Dental  (+) Dental Advisory Given and Teeth Intact   Pulmonary pneumonia -, resolved, Current Smoker,  breath sounds clear to auscultation        Cardiovascular hypertension, On Medications Rhythm:regular     Neuro/Psych PSYCHIATRIC DISORDERS negative neurological ROS     GI/Hepatic negative GI ROS, Neg liver ROS,   Endo/Other  negative endocrine ROS  Renal/GU negative Renal ROS  negative genitourinary   Musculoskeletal   Abdominal   Peds  Hematology negative hematology ROS (+)   Anesthesia Other Findings See surgeon's H&P   Reproductive/Obstetrics negative OB ROS                          Anesthesia Physical Anesthesia Plan  ASA: II and emergent  Anesthesia Plan: General   Post-op Pain Management:    Induction: Intravenous, Rapid sequence and Cricoid pressure planned  Airway Management Planned: Oral ETT  Additional Equipment:   Intra-op Plan:   Post-operative Plan: Extubation in OR  Informed Consent: I have reviewed the patients History and Physical, chart, labs and discussed the procedure including the risks, benefits and alternatives for the proposed anesthesia with the patient or authorized representative who has indicated his/her understanding and acceptance.   Dental Advisory Given  Plan Discussed with: CRNA and Surgeon  Anesthesia Plan Comments:       Anesthesia Quick Evaluation

## 2013-03-05 NOTE — Transfer of Care (Signed)
Immediate Anesthesia Transfer of Care Note  Patient: [name]  Procedure(s) Performed: Procedure(s): Irrigation and Debridement Olecranon Bursa/Left Elbow (Left)  Patient Location: PACU  Anesthesia Type:General  Level of Consciousness: awake, alert  and patient cooperative  Airway & Oxygen Therapy: Patient Spontanous Breathing and Patient connected to nasal cannula oxygen  Post-op Assessment: Report given to PACU RN, Post -op Vital signs reviewed and stable and Patient moving all extremities X 4  Post vital signs: Reviewed and stable  Complications: No apparent anesthesia complications

## 2013-03-05 NOTE — Progress Notes (Signed)
ANTIBIOTIC CONSULT NOTE - INITIAL  Pharmacy Consult:  Vancomycin Indication:  Joint infection  No Known Allergies  Patient Measurements: Height: 4\' 11"  (149.9 cm) Weight: 138 lb (62.596 kg) IBW/kg (Calculated) : 43.2  Vital Signs: Temp: 97.5 F (36.4 C) (05/14 2327) Temp src: Oral (05/14 1639) BP: 111/57 mmHg (05/14 2327) Pulse Rate: 64 (05/14 2327) Intake/Output from this shift: Total I/O In: 1300 [I.V.:1300] Out: -   Labs:  Recent Labs  03/05/13 1627  WBC 11.0*  HGB 16.0*  PLT 203  CREATININE 0.76   Estimated Creatinine Clearance: 48.2 ml/min (by C-G formula based on Cr of 0.76). No results found for this basename: VANCOTROUGH, Leodis Binet, VANCORANDOM, GENTTROUGH, GENTPEAK, GENTRANDOM, TOBRATROUGH, TOBRAPEAK, TOBRARND, AMIKACINPEAK, AMIKACINTROU, AMIKACIN,  in the last 72 hours   Microbiology: Recent Results (from the past 720 hour(s))  SURGICAL PCR SCREEN     Status: None   Collection Time    03/05/13  4:27 PM      Result Value Range Status   MRSA, PCR NEGATIVE  NEGATIVE Final   Staphylococcus aureus NEGATIVE  NEGATIVE Final   Comment:            The Xpert SA Assay (FDA     approved for NASAL specimens     in patients over 77 years of age),     is one component of     a comprehensive surveillance     program.  Test performance has     been validated by [employer] for patients greater     than or equal to 77 year old.     It is not intended     to diagnose infection nor to     guide or monitor treatment.  CULTURE, ROUTINE-ABSCESS     Status: None   Collection Time    03/05/13  9:00 PM      Result Value Range Status   Specimen Description ABSCESS   Final   Special Requests LEFT SEPTIC OLECRANONAAAAA   Final   Gram Stain PENDING   Incomplete   Culture PENDING   Incomplete   Report Status PENDING   Incomplete    Medical History: Past Medical History  Diagnosis Date  . Hypertension   . Pneumonia     hx of   . Arthritis     knees, hands   .  Depression         Assessment: 79 YOF with history of left elbow olecranon bursitis, now s/p I&D and to start vancomycin.  No issue with her renal function.  Noted patient received vancomycin 1gm IV around 2200 today.   Goal of Therapy:  Vancomycin trough level 15-20 mcg/ml   Plan:  - Vanc 500mg  IV Q12H, start tomorrow - Monitor renal fxn, clinical course, vanc trough prior to 4th dose     Proctor Carriker D. Laney Potash, PharmD, BCPS Pager:  [phone] 03/05/2013, 11:43 PM

## 2013-03-05 NOTE — H&P (Signed)
NAMEJOELENE, BARRIERE ACCOUNT NO.: [id number]   MEDICAL RECORD NO.: [id number]   LOCATION: [facility] FACILITY: [facility]   PHYSICIAN: Madelynn Done, MD   DATE OF BIRTH: 05-Aug-1934  DATE OF ADMISSION: 03/05/2013    HISTORY & PHYSICAL  CHIEF COMPLAINT: Septic olecranon bursitis, left elbow.  HISTORY OF PRESENT ILLNESS: This is a 79 year old lady with a history  of an olecranon bursitis. They came up 4 weeks ago after hitting her  elbow on a door frame. She saw her medical doctor 2 weeks ago, had  aspiration and injection of cortisone and over the last week it has been  getting more red and warm. Today, it opened and drained. She called  the office and was told to come in for evaluation. Seen in the office  today. She was noted to have frank pus draining from this, it was red  and swollen. She is running no fevers. Having no chills or  constitutional symptoms. The patient does have bilateral total hip  arthroplasties. With her redness and draining of frank pus, the patient  will be sent to [facility] at this time, to be seen by Dr. Melvyn Novas  and subsequently have incision and drainage of her septic olecranon  bursa. The surgery is explained and the rationale of the surgery  explained to the patient.   PAST MEDICAL HISTORY: Drug allergies none.  CURRENT MEDICATIONS:  1. Hydrochlorothiazide 12.5 mg daily.  2. Aspirin 81 mg daily.  3. Multivitamin PreserVision AREDS b.i.d.  4. Benicar 20 mg daily.  5. Lipitor 40 mg daily.  6. Effexor XR 75 mg 1 daily.  7. Vitamin D 1000 units 1 b.i.d.  8. Calcium 600 units daily.  SERIOUS MEDICAL ILLNESSES: Hypertension, hyperlipidemia, depression.  PREVIOUS SURGERIES: Bilateral total hip arthroplasty, cholecystectomy,  hysterectomy, ORIF of right elbow fracture and cataract in the left eye.  FAMILY HISTORY: Negative.  SOCIAL HISTORY: The patient is widowed. She lives alone. She smokes  3/4 of a pack of cigarettes per day and does not drink.  REVIEW OF  SYSTEMS: CENTRAL NERVOUS SYSTEM: Negative for headache,  blurred vision, or dizziness. PULMONARY: Negative for shortness of  breath, PND, and orthopnea. CARDIOVASCULAR: Negative for chest pain or  palpitation. Positive for occasional extrasystole. GI: Negative for  ulcers, hepatitis. GU: Negative for urinary tract difficulty.   MUSCULOSKELETAL: Positive as in HPI.  PHYSICAL EXAMINATION: VITAL SIGNS: Temperature is 98.1, blood pressure  is 118/58.  GENERAL: This is a well-developed, well-nourished lady, in no acute  distress.  HEENT: Head normocephalic. Nose patent. The right eye is clouded over  from a previous injury as a child. 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 The throat is without injection.  NECK: Supple without adenopathy. Carotids 2+ without bruit.  CHEST: Clear to auscultation. No rales or rhonchi. Respirations 12.  HEART: Regular rate and rhythm with occasional extrasystole without  murmur.  ABDOMEN: Soft with active bowel sounds. No masses or organomegaly.  NEUROLOGIC: Patient alert and oriented to time, place, and person.  Cranial nerves II through XII grossly intact.  EXTREMITIES: The left elbow to have a swollen red and draining  olecranon bursa. Also just distal to the olecranon bursa, there is  another little nodule that is not warm but is firm and mildly tender.  Sensation and circulation are intact. There is no streaking up or down  the arm. The central portion of the bursa has opened and is draining  frank brown  pus.   ASSESSMENT: Septic olecranon bursa, left elbow.   PLAN: With her total joints in place, we will get the patient over to  the hospital for evaluation by Dr. Melvyn Novas and incision and drainage and  IV antibiotic therapy for her septic olecranon bursa. She will proceed  with this evening. Questions invited and answered.   H/P performed by Leilani Able PA-C I have examined patient and reviewed his note, I have copied his  note into this document  R/B/A DISCUSSED WITH PT IN HOLDING AREA.  PT VOICED UNDERSTANDING OF PLAN CONSENT SIGNED DAY OF SURGERY PT SEEN AND EXAMINED PRIOR TO OPERATIVE PROCEDURE/DAY OF SURGERY SITE MARKED. QUESTIONS ANSWERED WILL REMAIN AN INPATIENT FOLLOWING SURGERY

## 2013-03-05 NOTE — H&P (Signed)
[name], [name]                ACCOUNT NO.:  [id number]  MEDICAL RECORD NO.:  [id number]  LOCATION:  [facility]                         FACILITY:  [facility]  PHYSICIAN:  Madelynn Done, MD  DATE OF BIRTH:  1934-03-27  DATE OF ADMISSION:  03/05/2013 DATE OF DISCHARGE:                             HISTORY & PHYSICAL   CHIEF COMPLAINT:  Septic olecranon bursitis, left elbow.  HISTORY OF PRESENT ILLNESS:  This is a 79 year old lady with a history of an olecranon bursitis.  They came up 4 weeks ago after hitting her elbow on a door frame.  She saw her medical doctor 2 weeks ago, had aspiration and injection of cortisone and over the last week it has been getting more red and warm.  Today, it opened and drained.  She called the office and was told to come in for evaluation.  Seen in the office today.  She was noted to have frank pus draining from this, it was red and swollen.  She is running no fevers.  Having no chills or constitutional symptoms.  The patient does have bilateral total hip Arthroplasties and left total knee.  With her redness and draining of frank pus, the patient will be sent to [facility] at this time, to be seen by Dr. Melvyn Novas and subsequently have incision and drainage of her septic olecranon bursa.  The surgery is explained and the rationale of the surgery explained to the patient.  PAST MEDICAL HISTORY:  Drug allergies none.  CURRENT MEDICATIONS: 1. Hydrochlorothiazide 12.5 mg daily. 2. Aspirin 81 mg daily. 3. Multivitamin PreserVision AREDS b.i.d. 4. Benicar 20 mg daily. 5. Lipitor 40 mg daily. 6. Effexor XR 75 mg 1 daily. 7. Vitamin D 1000 units 1 b.i.d. 8. Calcium 600 units daily.  SERIOUS MEDICAL ILLNESSES:  Hypertension, hyperlipidemia, depression.  PREVIOUS SURGERIES:  Bilateral total hip arthroplasty,left total knee cholecystectomy, hysterectomy, ORIF of right elbow fracture and cataract in the left eye.  FAMILY HISTORY:  Negative.  SOCIAL  HISTORY:  The patient is widowed.  She lives alone.  She smokes 3/4 of a pack of cigarettes per day and does not drink.  REVIEW OF SYSTEMS:  CENTRAL NERVOUS SYSTEM:  Negative for headache, blurred vision, or dizziness.  PULMONARY:  Negative for shortness of breath, PND, and orthopnea.  CARDIOVASCULAR:  Negative for chest pain or palpitation.  Positive for occasional extrasystole.  GI:  Negative for ulcers, hepatitis.  GU:  Negative for urinary tract difficulty. MUSCULOSKELETAL:  Positive as in HPI.  PHYSICAL EXAMINATION:  VITAL SIGNS: Temperature is 98.1, blood pressure is 118/58. GENERAL:  This is a well-developed, well-nourished lady, in no acute distress. HEENT:  Head normocephalic.  Nose patent.  The right eye is clouded over from a previous injury as a child.  She is blind in the right eye.  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  The throat is without injection. NECK:  Supple without adenopathy.  Carotids 2+ without bruit. CHEST:  Clear to auscultation.  No rales or rhonchi.  Respirations 12. HEART:  Regular rate and rhythm with occasional extrasystole without murmur. ABDOMEN:  Soft with active bowel sounds.  No masses or organomegaly. NEUROLOGIC:  Patient alert and oriented to time, place, and person. Cranial nerves II through XII grossly intact. EXTREMITIES:  The left elbow to have a swollen red and draining olecranon bursa.  Also just distal to the olecranon bursa, there is another little nodule that is not warm but is firm and mildly tender. Sensation and circulation are intact.  There is no streaking up or down the arm.  The central portion of the bursa has opened and is draining frank brown pus.  ASSESSMENT:  Septic olecranon bursa, left elbow.  PLAN:  With her total joints in place, we will get the patient over to the hospital for evaluation by Dr. Melvyn Novas and incision and drainage and IV antibiotic therapy for her septic olecranon bursa.  She will proceed with this evening.   Questions invited and answered.     Jaquelyn Bitter. Junious Ragone, P.A.   ______________________________ Madelynn Done, MD    SJC/MEDQ  D:  03/05/2013  T:  03/05/2013  Job:  [id number]

## 2013-03-06 MED ORDER — HYDROCHLOROTHIAZIDE 12.5 MG PO CAPS
12.5000 mg | ORAL_CAPSULE | Freq: Every day | ORAL | Status: DC
  Administered 2013-03-07: 12.5 mg via ORAL
  Filled 2013-03-06: qty 1

## 2013-03-06 NOTE — Anesthesia Postprocedure Evaluation (Signed)
Anesthesia Post Note  Patient: [name]  Procedure(s) Performed: Procedure(s) (LRB): Irrigation and Debridement Olecranon Bursa/Left Elbow (Left)  Anesthesia type: General  Patient location: PACU  Post pain: Pain level controlled  Post assessment: Patient's Cardiovascular Status Stable  Last Vitals:  Filed Vitals:   03/06/13 0540  BP: 103/59  Pulse: 55  Temp: 36.6 C  Resp: 16    Post vital signs: Reviewed and stable  Level of consciousness: alert  Complications: No apparent anesthesia complications

## 2013-03-06 NOTE — Op Note (Signed)
[name], [name]                ACCOUNT NO.:  [id number]  MEDICAL RECORD NO.:  [id number]  LOCATION:  5N30C                        FACILITY:  [facility]  PHYSICIAN:  Madelynn Done, MD  DATE OF BIRTH:  1934-01-09  DATE OF PROCEDURE:  03/05/2013 DATE OF DISCHARGE:                              OPERATIVE REPORT   POSTOPERATIVE DIAGNOSIS:  Left elbow infected olecranon bursa.  POSTOPERATIVE DIAGNOSIS:  Left elbow infected olecranon bursa.  ATTENDING PHYSICIAN:  Sharma Covert IV, MD, who scrubbed and present for the entire procedure.  ASSISTANT SURGEON:  None.  SURGICAL PROCEDURES: 1. Left elbow excision of olecranon bursa 2. Left elbow incision and drainage of abscess, left elbow olecranon     bursa.  ANESTHESIA:  General via endotracheal tube.  TOURNIQUET TIME:  10 minutes, 250 mmHg.  DRAINS:  One blue vessel loop.  SURGICAL INDICATION:  Ms. Messler is a 79 year old right-hand dominant female, who presented to the office with a draining purulent infected bursa.  The patient was seen and evaluated in the office and I saw her in hospital and recommended that she undergo the above procedure. Risks, benefits, and alternatives were discussed in detail with the patient and signed informed consent was obtained.  Risks include, but not limited to bleeding, infection, damage to nearby nerves, arteries, or tendons, loss of motion of the wrist and digits, persistent drainage, and need for further surgical intervention.  DESCRIPTION OF PROCEDURE:  The patient was appropriately identified in the preop holding area and mark with a permanent marker made on the left elbow to indicate correct operative site.  The patient was then brought back to the operating room, placed supine on the anesthesia room table. General anesthesia was administered.  The patient tolerated this well. A well-padded tourniquet was then placed on left brachium and sealed with 1000 drape.  Left upper extremity was  then prepped and draped in normal sterile fashion.  Time-out was called, correct side was identified, and procedure then begun.  Attention was then turned to the left elbow where the curvilinear incision made over the olecranon tip curving radially.  The limb was elevated.  Tourniquet insufflated to show the exposure.  Upon immediate incision through the skin, gross purulence was exuding from the area. Wound cultures were then taken. Following this, after incision and drainage of the abscess area of bursa, the skin flaps were then raised and complete excision of the bursa was then carried out.  Bursectomy was then carried out circumferentially.  Then 3 liters of pulsatile lavage was then used to thoroughly irrigate the abscess.  Copious irrigation done throughout. The wound was then closed over a small blue vessel loop and simple 3-0 Prolene sutures were used to close the skin.  Adaptic dressing and sterile compressive bandage was then applied.  The patient was placed in a long-arm splint, extubated, and taken to recovery room in good condition.  POSTPROCEDURE PLAN:  The patient admitted overnight for IV antibiotics, pain control, will be seen within approximately 48 hours for wound check and drain removal.  Continue on oral IV antibiotics until culture results are known.     Madelynn Done, MD  FWO/MEDQ  D:  03/05/2013  T:  03/06/2013  Job:  [id number]

## 2013-03-06 NOTE — Progress Notes (Signed)
Pt doing well Continue with IV abx Will change dressing in am If wound looks ok will consider d/c in am

## 2013-03-06 NOTE — Progress Notes (Signed)
UR COMPLETED  

## 2013-03-07 ENCOUNTER — Encounter (HOSPITAL_COMMUNITY): Payer: Self-pay | Admitting: Orthopedic Surgery

## 2013-03-07 MED ORDER — HYDROCODONE-ACETAMINOPHEN 5-300 MG PO TABS: 1.0000 | ORAL_TABLET | Freq: Four times a day (QID) | ORAL | Status: DC | PRN

## 2013-03-07 MED ORDER — DOXYCYCLINE HYCLATE 100 MG PO TABS: 100.0000 mg | ORAL_TABLET | Freq: Two times a day (BID) | ORAL | Status: DC

## 2013-03-07 MED ORDER — DOCUSATE SODIUM 100 MG PO CAPS: 100.0000 mg | ORAL_CAPSULE | Freq: Two times a day (BID) | ORAL | Status: DC

## 2013-03-07 NOTE — Progress Notes (Signed)
Pt discharged to home accompanied by family. Discharged instructions and rx given and explained and pt stated understanding. Pts IV was removed. Pt left unit in a stable condition via wheelchair. 

## 2013-03-07 NOTE — Discharge Summary (Signed)
Physician Discharge Summary  Patient ID: [name] MRN: [id number] DOB/AGE: 77-Sep-1935 77 y.o.  Admit date: 03/05/2013 Discharge date: 03/07/2013  Admission Diagnoses: Infected Left Elbow Past Medical History  Diagnosis Date  . Hypertension   . Pneumonia     hx of   . Arthritis     knees, hands   . Depression     Discharge Diagnoses:  LEFT ELBOW SEPTIC OLECRANON BURSITIS  Surgeries: Procedure(s): Irrigation and Debridement Olecranon Bursa/Left Elbow on 03/05/2013    Consultants:  NONE  Discharged Condition: Improved  Hospital Course: [name] is an 79 y.o. female who was admitted 03/05/2013 with a chief complaint of No chief complaint on file. , and found to have a diagnosis of Infected Left Elbow.  They were brought to the operating room on 03/05/2013 and underwent Procedure(s): Irrigation and Debridement Olecranon Bursa/Left Elbow.    They were given perioperative antibiotics: Anti-infectives   Start     Dose/Rate Route Frequency Ordered Stop   03/06/13 1200  vancomycin (VANCOCIN) 500 mg in sodium chloride 0.9 % 100 mL IVPB     500 mg 100 mL/hr over 60 Minutes Intravenous Every 12 hours 03/05/13 2344      .  They were given sequential compression devices, early ambulation, and Other (comment)AMBULATION for DVT prophylaxis.  Recent vital signs: Patient Vitals for the past 24 hrs:  BP Temp Pulse Resp SpO2  03/07/13 0556 133/62 mmHg 98.8 F (37.1 C) 61 16 96 %  03/06/13 2257 97/61 mmHg 98.4 F (36.9 C) 44 16 95 %  03/06/13 1400 102/53 mmHg 98 F (36.7 C) 67 16 94 %  .  Recent laboratory studies: Dg Chest 2 View  03/05/2013   *RADIOLOGY REPORT*  Clinical Data: Preop.  Hypertension.  CHEST - 2 VIEW  Comparison: 10/02/2012  Findings: There is hyperinflation of the lungs compatible with COPD.  Bibasilar scarring, stable.  Heart is normal size.  No effusions.  Thoracolumbar scoliosis and advanced degenerative changes.  Prior cholecystectomy.  IMPRESSION:  COPD/chronic changes.  No acute findings.   Original Report Authenticated By: Charlett Nose, M.D.    Discharge Medications:     Medication List    ASK your doctor about these medications       ALPRAZolam 0.25 MG tablet  Commonly known as:  XANAX  Take 0.25 mg by mouth daily as needed for anxiety. For anxiety     aspirin EC 81 MG tablet  Take 81 mg by mouth at bedtime.     hydrochlorothiazide 25 MG tablet  Commonly known as:  HYDRODIURIL  Take 12.5 mg by mouth daily after breakfast.     lovastatin 40 MG tablet  Commonly known as:  MEVACOR  Take 40 mg by mouth every evening.     multivitamin with minerals Tabs  Take 1 tablet by mouth every morning.     OCUVITE PRESERVISION PO  Take by mouth 2 (two) times daily.     olmesartan 20 MG tablet  Commonly known as:  BENICAR  Take 20 mg by mouth daily after breakfast.     venlafaxine XR 75 MG 24 hr capsule  Commonly known as:  EFFEXOR-XR  Take 75 mg by mouth daily after breakfast.     Vitamin D 2000 UNITS Caps  Take 1 capsule by mouth at bedtime.        Diagnostic Studies: Dg Chest 2 View  03/05/2013   *RADIOLOGY REPORT*  Clinical Data: Preop.  Hypertension.  CHEST - 2 VIEW  Comparison: 10/02/2012  Findings: There is hyperinflation of the lungs compatible with COPD.  Bibasilar scarring, stable.  Heart is normal size.  No effusions.  Thoracolumbar scoliosis and advanced degenerative changes.  Prior cholecystectomy.  IMPRESSION: COPD/chronic changes.  No acute findings.   Original Report Authenticated By: Charlett Nose, M.D.    They benefited maximally from their hospital stay and there were no complications.     Disposition: HOME  PT SEEN/EXAMINED WOUND CHANGED LOOKS [facility] BETTER HOME TODAY F/U IN OFFICE ON TUESDAY    SignedSharma Covert 03/07/2013, 7:34 AM

## 2013-03-08 LAB — CULTURE, ROUTINE-ABSCESS

## 2014-01-28 ENCOUNTER — Encounter (INDEPENDENT_AMBULATORY_CARE_PROVIDER_SITE_OTHER): Payer: Self-pay | Admitting: General Surgery

## 2014-01-28 ENCOUNTER — Ambulatory Visit (INDEPENDENT_AMBULATORY_CARE_PROVIDER_SITE_OTHER): Payer: Medicare Other | Admitting: General Surgery

## 2014-01-28 VITALS — BP 130/80 | HR 74 | Temp 97.8°F | Resp 16 | Ht 60.0 in | Wt 139.8 lb

## 2014-01-28 DIAGNOSIS — K6289 Other specified diseases of anus and rectum: Secondary | ICD-10-CM

## 2014-01-28 DIAGNOSIS — R198 Other specified symptoms and signs involving the digestive system and abdomen: Secondary | ICD-10-CM

## 2014-01-28 NOTE — Progress Notes (Signed)
Patient ID: [name], female   DOB: [birth year], 80 y.o.   MRN: [id number]  Chief Complaint  Patient presents with  . New Evaluation    eval perirectal mass right side    HPI [name] is a 80 y.o. female.  Chief complaint: Perianal mass HPI Approximately 2 weeks ago, the patient noticed a small mass extra anal area. It bleeds some when she does personal hygiene. No significant pain. It has not changed much over the 2 weeks. No significant change in bowel habits. Occasional itching in the area is also experienced. Past Medical History  Diagnosis Date  . Hypertension   . Pneumonia     hx of   . Arthritis     knees, hands   . Depression   . COPD (chronic obstructive pulmonary disease)   . Hyperlipidemia   . Osteoporosis     Past Surgical History  Procedure Laterality Date  . Cholecystectomy    . Abdominal hysterectomy    . Joint replacement      right and left hip replacement   . Other surgical history      arthroscopic right knee   . Eye surgery      left eye cataract surgery   . Other surgical history      right carpal tunnel  . Other surgical history      right elbow surgery   . Hernia repair      left inguinal hernia repair   . Other surgical history      oophorectomy 1958  . Total knee arthroplasty  12/13/2011    Procedure: TOTAL KNEE ARTHROPLASTY;  Surgeon: Tobi Bastos, MD;  Location: [facility];  Service: Orthopedics;  Laterality: Right;  . I&d extremity Left 03/05/2013    Procedure: Irrigation and Debridement Olecranon Bursa/Left Elbow;  Surgeon: Linna Hoff, MD;  Location: [place];  Service: Orthopedics;  Laterality: Left;    Family History  Problem Relation Age of Onset  . Hypertension Mother   . Cancer Father     lung  . Arthritis Father     Social History History  Substance Use Topics  . Smoking status: Current Every Day Smoker -- 0.50 packs/day for 40 years  . Smokeless tobacco: Never Used  . Alcohol Use: No    No Known  Allergies  Current Outpatient Prescriptions  Medication Sig Dispense Refill  . ALPRAZolam (XANAX) 0.25 MG tablet Take 0.25 mg by mouth daily as needed for anxiety. For anxiety      . aspirin EC 81 MG tablet Take 81 mg by mouth at bedtime.      . Cholecalciferol (VITAMIN D) 2000 UNITS CAPS Take 1 capsule by mouth at bedtime.      . docusate sodium (COLACE) 100 MG capsule Take 1 capsule (100 mg total) by mouth 2 (two) times daily.  30 capsule  0  . hydrochlorothiazide (HYDRODIURIL) 25 MG tablet Take 12.5 mg by mouth daily after breakfast.      . lovastatin (MEVACOR) 40 MG tablet Take 40 mg by mouth every evening.      . Multiple Vitamin (MULTIVITAMIN WITH MINERALS) TABS Take 1 tablet by mouth every morning.      . Multiple Vitamins-Minerals (OCUVITE PRESERVISION PO) Take by mouth 2 (two) times daily.      Marland Kitchen olmesartan (BENICAR) 20 MG tablet Take 20 mg by mouth daily after breakfast.      . venlafaxine (EFFEXOR-XR) 75 MG 24 hr capsule Take 75 mg  by mouth daily after breakfast.      . doxycycline (VIBRA-TABS) 100 MG tablet Take 1 tablet (100 mg total) by mouth 2 (two) times daily.  20 tablet  0   No current facility-administered medications for this visit.    Review of Systems Review of Systems  Constitutional: Negative for fever, chills and unexpected weight change.  HENT: Negative for congestion, hearing loss, sore throat, trouble swallowing and voice change.   Eyes: Negative for visual disturbance.  Respiratory: Negative for cough and wheezing.   Cardiovascular: Negative for chest pain, palpitations and leg swelling.  Gastrointestinal: Positive for anal bleeding. Negative for nausea, vomiting, abdominal pain, diarrhea, constipation, blood in stool and abdominal distention.       See history of present illness  Genitourinary: Negative for hematuria, vaginal bleeding and difficulty urinating.  Musculoskeletal: Negative for arthralgias.  Skin: Negative for rash and wound.  Neurological:  Negative for seizures, syncope and headaches.  Hematological: Negative for adenopathy. Does not bruise/bleed easily.  Psychiatric/Behavioral: Negative for confusion.    Blood pressure 130/80, pulse 74, temperature 97.8 F (36.6 C), temperature source Temporal, resp. rate 16, height 5' (1.524 m), weight 139 lb 12.8 oz (63.413 kg).  Physical Exam Physical Exam  Constitutional: She is oriented to person, place, and time. She appears well-developed and well-nourished.  HENT:  Head: Normocephalic and atraumatic.  Right Ear: External ear normal.  Left Ear: External ear normal.  Mouth/Throat: No oropharyngeal exudate.  Eyes: EOM are normal. Pupils are equal, round, and reactive to light.  Neck: Normal range of motion. Neck supple. No tracheal deviation present.  Cardiovascular: Normal rate, regular rhythm and normal heart sounds.   Pulmonary/Chest: Effort normal and breath sounds normal. No stridor. No respiratory distress. She has no wheezes.  Abdominal: Soft. Bowel sounds are normal. She exhibits no distension. There is no tenderness.  Genitourinary:  Rectal exam unremarkable, 1.5 cm disc-shaped right perianal mass, mildly friable, no active bleeding  Musculoskeletal: She exhibits no tenderness.  Neurological: She is alert and oriented to person, place, and time.  Skin: Skin is warm and dry.  Psychiatric: She has a normal mood and affect.    Data Reviewed Referral office notes  Assessment    Perianal mass    Plan    Worst case scenario, this may represent a squamous cell carcinoma. In any case the diagnosis is uncertain and I recommend proceeding with excision under general anesthesia. Procedure, risks, and benefits were discussed in detail the patient and her daughter. She is agreeable.       Zenovia Jarred 01/28/2014, 11:50 AM

## 2014-02-02 ENCOUNTER — Encounter (HOSPITAL_BASED_OUTPATIENT_CLINIC_OR_DEPARTMENT_OTHER): Payer: Self-pay | Admitting: *Deleted

## 2014-02-02 NOTE — Progress Notes (Signed)
To come in for bmet-had ekg-5/14

## 2014-02-03 ENCOUNTER — Other Ambulatory Visit (INDEPENDENT_AMBULATORY_CARE_PROVIDER_SITE_OTHER): Payer: Self-pay | Admitting: General Surgery

## 2014-02-03 ENCOUNTER — Encounter (HOSPITAL_BASED_OUTPATIENT_CLINIC_OR_DEPARTMENT_OTHER)
Admission: RE | Admit: 2014-02-03 | Discharge: 2014-02-03 | Disposition: A | Discharge status: Home / Self Care | Payer: Medicare Other | Source: Ambulatory Visit | Attending: General Surgery | Admitting: General Surgery

## 2014-02-03 LAB — BASIC METABOLIC PANEL
BUN: 15 mg/dL (ref 6–23)
CO2: 29 meq/L (ref 19–32)
Calcium: 11 mg/dL — ABNORMAL HIGH (ref 8.4–10.5)
Chloride: 98 mEq/L (ref 96–112)
Creatinine, Ser: 0.69 mg/dL (ref 0.50–1.10)
GFR calc Af Amer: >90 mL/min (ref >90)
GFR, EST NON AFRICAN AMERICAN: 82 mL/min — AB (ref >90)
GLUCOSE: 128 mg/dL — AB (ref 70–99)
POTASSIUM: 4.1 meq/L (ref 3.7–5.3)
Sodium: 142 mEq/L (ref 137–147)

## 2014-02-06 ENCOUNTER — Ambulatory Visit (HOSPITAL_BASED_OUTPATIENT_CLINIC_OR_DEPARTMENT_OTHER)
Admission: RE | Admit: 2014-02-06 | Discharge: 2014-02-06 | Disposition: A | Discharge status: Home / Self Care | Payer: Medicare Other | Source: Ambulatory Visit | Attending: General Surgery | Admitting: General Surgery

## 2014-02-06 ENCOUNTER — Encounter (HOSPITAL_BASED_OUTPATIENT_CLINIC_OR_DEPARTMENT_OTHER)
Admission: RE | Disposition: A | Discharge status: Home / Self Care | Payer: Self-pay | Source: Ambulatory Visit | Attending: General Surgery

## 2014-02-06 ENCOUNTER — Encounter (HOSPITAL_BASED_OUTPATIENT_CLINIC_OR_DEPARTMENT_OTHER): Payer: Medicare Other | Admitting: Anesthesiology

## 2014-02-06 ENCOUNTER — Ambulatory Visit (HOSPITAL_BASED_OUTPATIENT_CLINIC_OR_DEPARTMENT_OTHER): Payer: Medicare Other | Admitting: Anesthesiology

## 2014-02-06 ENCOUNTER — Encounter (HOSPITAL_BASED_OUTPATIENT_CLINIC_OR_DEPARTMENT_OTHER): Payer: Self-pay

## 2014-02-06 DIAGNOSIS — F329 Major depressive disorder, single episode, unspecified: Secondary | ICD-10-CM | POA: Insufficient documentation

## 2014-02-06 DIAGNOSIS — K6289 Other specified diseases of anus and rectum: Secondary | ICD-10-CM

## 2014-02-06 DIAGNOSIS — F17200 Nicotine dependence, unspecified, uncomplicated: Secondary | ICD-10-CM | POA: Insufficient documentation

## 2014-02-06 DIAGNOSIS — Z7982 Long term (current) use of aspirin: Secondary | ICD-10-CM | POA: Insufficient documentation

## 2014-02-06 DIAGNOSIS — Z96659 Presence of unspecified artificial knee joint: Secondary | ICD-10-CM | POA: Insufficient documentation

## 2014-02-06 DIAGNOSIS — F3289 Other specified depressive episodes: Secondary | ICD-10-CM | POA: Insufficient documentation

## 2014-02-06 DIAGNOSIS — C44529 Squamous cell carcinoma of skin of other part of trunk: Secondary | ICD-10-CM | POA: Insufficient documentation

## 2014-02-06 DIAGNOSIS — J449 Chronic obstructive pulmonary disease, unspecified: Secondary | ICD-10-CM | POA: Insufficient documentation

## 2014-02-06 DIAGNOSIS — E785 Hyperlipidemia, unspecified: Secondary | ICD-10-CM | POA: Insufficient documentation

## 2014-02-06 DIAGNOSIS — Z96649 Presence of unspecified artificial hip joint: Secondary | ICD-10-CM | POA: Insufficient documentation

## 2014-02-06 DIAGNOSIS — J4489 Other specified chronic obstructive pulmonary disease: Secondary | ICD-10-CM | POA: Insufficient documentation

## 2014-02-06 DIAGNOSIS — I10 Essential (primary) hypertension: Secondary | ICD-10-CM | POA: Insufficient documentation

## 2014-02-06 DIAGNOSIS — M810 Age-related osteoporosis without current pathological fracture: Secondary | ICD-10-CM | POA: Insufficient documentation

## 2014-02-06 HISTORY — PX: HEMORRHOID SURGERY: SHX153

## 2014-02-06 HISTORY — DX: Presence of spectacles and contact lenses: Z97.3

## 2014-02-06 HISTORY — DX: Unspecified fracture of lower end of right humerus, initial encounter for closed fracture: S42.401A

## 2014-02-06 LAB — POCT HEMOGLOBIN-HEMACUE: Hemoglobin: 16.3 g/dL — ABNORMAL HIGH (ref 12.0–15.0)

## 2014-02-06 SURGERY — HEMORRHOIDECTOMY
Anesthesia: General | Site: Anus

## 2014-02-06 MED ORDER — SUCCINYLCHOLINE CHLORIDE 20 MG/ML IJ SOLN
INTRAMUSCULAR | Status: AC
  Filled 2014-02-06: qty 2

## 2014-02-06 MED ORDER — BACITRACIN-NEOMYCIN-POLYMYXIN 400-5-5000 EX OINT
TOPICAL_OINTMENT | CUTANEOUS | Status: AC
  Filled 2014-02-06: qty 1

## 2014-02-06 MED ORDER — BACIT-POLY-NEO HC 1 % EX OINT
TOPICAL_OINTMENT | CUTANEOUS | Status: AC
  Filled 2014-02-06: qty 15

## 2014-02-06 MED ORDER — HYDROMORPHONE HCL PF 1 MG/ML IJ SOLN: 0.2500 mg | INTRAMUSCULAR | Status: DC | PRN

## 2014-02-06 MED ORDER — CEFAZOLIN SODIUM-DEXTROSE 2-3 GM-% IV SOLR
2.0000 g | INTRAVENOUS | Status: AC
  Administered 2014-02-06: 2 g via INTRAVENOUS

## 2014-02-06 MED ORDER — MIDAZOLAM HCL 2 MG/2ML IJ SOLN
INTRAMUSCULAR | Status: AC
Start: 2014-02-06 — End: 2014-02-06
  Filled 2014-02-06: qty 2

## 2014-02-06 MED ORDER — PROPOFOL 10 MG/ML IV BOLUS
INTRAVENOUS | Status: AC
  Filled 2014-02-06: qty 60

## 2014-02-06 MED ORDER — LIDOCAINE HCL (CARDIAC) 20 MG/ML IV SOLN
INTRAVENOUS | Status: DC | PRN
  Administered 2014-02-06: 80 mg via INTRAVENOUS

## 2014-02-06 MED ORDER — GLYCOPYRROLATE 0.2 MG/ML IJ SOLN
INTRAMUSCULAR | Status: DC | PRN
  Administered 2014-02-06: 0.2 mg via INTRAVENOUS

## 2014-02-06 MED ORDER — OXYCODONE-ACETAMINOPHEN 5-325 MG PO TABS: 1.0000 | ORAL_TABLET | Freq: Four times a day (QID) | ORAL | Status: DC | PRN

## 2014-02-06 MED ORDER — ONDANSETRON HCL 4 MG/2ML IJ SOLN
INTRAMUSCULAR | Status: DC | PRN
  Administered 2014-02-06: 4 mg via INTRAVENOUS

## 2014-02-06 MED ORDER — BUPIVACAINE-EPINEPHRINE PF 0.5-1:200000 % IJ SOLN
INTRAMUSCULAR | Status: AC
Start: 2014-02-06 — End: 2014-02-06
  Filled 2014-02-06: qty 30

## 2014-02-06 MED ORDER — EPHEDRINE SULFATE 50 MG/ML IJ SOLN
INTRAMUSCULAR | Status: DC | PRN
  Administered 2014-02-06: 10 mg via INTRAVENOUS

## 2014-02-06 MED ORDER — BUPIVACAINE HCL (PF) 0.5 % IJ SOLN
INTRAMUSCULAR | Status: AC
  Filled 2014-02-06: qty 30

## 2014-02-06 MED ORDER — CHLORHEXIDINE GLUCONATE 4 % EX LIQD: 1.0000 "application " | Freq: Once | CUTANEOUS | Status: DC

## 2014-02-06 MED ORDER — PROPOFOL 10 MG/ML IV BOLUS
INTRAVENOUS | Status: DC | PRN
  Administered 2014-02-06: 150 mg via INTRAVENOUS

## 2014-02-06 MED ORDER — THROMBIN 20000 UNITS EX KIT
PACK | CUTANEOUS | Status: AC
  Filled 2014-02-06: qty 1

## 2014-02-06 MED ORDER — BUPIVACAINE-EPINEPHRINE PF 0.5-1:200000 % IJ SOLN
INTRAMUSCULAR | Status: AC
  Filled 2014-02-06: qty 1.8

## 2014-02-06 MED ORDER — CEFAZOLIN SODIUM-DEXTROSE 2-3 GM-% IV SOLR
INTRAVENOUS | Status: AC
  Filled 2014-02-06: qty 50

## 2014-02-06 MED ORDER — MIDAZOLAM HCL 2 MG/2ML IJ SOLN: 1.0000 mg | INTRAMUSCULAR | Status: DC | PRN

## 2014-02-06 MED ORDER — BUPIVACAINE-EPINEPHRINE PF 0.5-1:200000 % IJ SOLN
INTRAMUSCULAR | Status: AC
  Filled 2014-02-06: qty 30

## 2014-02-06 MED ORDER — FENTANYL CITRATE 0.05 MG/ML IJ SOLN
INTRAMUSCULAR | Status: AC
  Filled 2014-02-06: qty 6

## 2014-02-06 MED ORDER — FENTANYL CITRATE 0.05 MG/ML IJ SOLN
INTRAMUSCULAR | Status: DC | PRN
  Administered 2014-02-06 (×2): 50 ug via INTRAVENOUS

## 2014-02-06 MED ORDER — BUPIVACAINE-EPINEPHRINE 0.5% -1:200000 IJ SOLN
INTRAMUSCULAR | Status: DC | PRN
  Administered 2014-02-06: 10 mL

## 2014-02-06 MED ORDER — BACITRACIN ZINC 500 UNIT/GM EX OINT
TOPICAL_OINTMENT | CUTANEOUS | Status: DC | PRN
  Administered 2014-02-06: 1 via TOPICAL

## 2014-02-06 MED ORDER — DEXAMETHASONE SODIUM PHOSPHATE 4 MG/ML IJ SOLN
INTRAMUSCULAR | Status: DC | PRN
  Administered 2014-02-06: 10 mg via INTRAVENOUS

## 2014-02-06 MED ORDER — LACTATED RINGERS IV SOLN
INTRAVENOUS | Status: DC
  Administered 2014-02-06 (×2): via INTRAVENOUS

## 2014-02-06 MED ORDER — FENTANYL CITRATE 0.05 MG/ML IJ SOLN: 50.0000 ug | INTRAMUSCULAR | Status: DC | PRN

## 2014-02-06 SURGICAL SUPPLY — 43 items
BLADE SURG 11 STRL SS (BLADE) IMPLANT
BLADE SURG 15 STRL LF DISP TIS (BLADE) ×1 IMPLANT
BLADE SURG 15 STRL SS (BLADE) ×2
BRIEF STRETCH FOR OB PAD LRG (UNDERPADS AND DIAPERS) ×1 IMPLANT
CANISTER SUCT 1200ML W/VALVE (MISCELLANEOUS) ×2 IMPLANT
CLEANER CAUTERY TIP 5X5 PAD (MISCELLANEOUS) IMPLANT
COVER MAYO STAND STRL (DRAPES) ×2 IMPLANT
DECANTER SPIKE VIAL GLASS SM (MISCELLANEOUS) ×1 IMPLANT
DRAPE UTILITY XL STRL (DRAPES) ×2 IMPLANT
DRSG EMULSION OIL 3X3 NADH (GAUZE/BANDAGES/DRESSINGS) IMPLANT
DRSG PAD ABDOMINAL 8X10 ST (GAUZE/BANDAGES/DRESSINGS) ×2 IMPLANT
ELECT REM PT RETURN 9FT ADLT (ELECTROSURGICAL) ×2
ELECTRODE REM PT RTRN 9FT ADLT (ELECTROSURGICAL) ×1 IMPLANT
GAUZE VASELINE 1X8 (GAUZE/BANDAGES/DRESSINGS) ×2 IMPLANT
GLOVE BIO SURGEON STRL SZ8 (GLOVE) ×2 IMPLANT
GLOVE BIOGEL PI IND STRL 7.0 (GLOVE) IMPLANT
GLOVE BIOGEL PI IND STRL 8 (GLOVE) ×1 IMPLANT
GLOVE BIOGEL PI INDICATOR 7.0 (GLOVE) ×1
GLOVE BIOGEL PI INDICATOR 8 (GLOVE) ×1
GLOVE ECLIPSE 7.0 STRL STRAW (GLOVE) ×1 IMPLANT
GOWN STRL REUS W/ TWL LRG LVL3 (GOWN DISPOSABLE) ×1 IMPLANT
GOWN STRL REUS W/ TWL XL LVL3 (GOWN DISPOSABLE) ×1 IMPLANT
GOWN STRL REUS W/TWL LRG LVL3 (GOWN DISPOSABLE) ×2
GOWN STRL REUS W/TWL XL LVL3 (GOWN DISPOSABLE) ×2
NEEDLE HYPO 22GX1.5 SAFETY (NEEDLE) ×2 IMPLANT
PACK BASIN DAY SURGERY FS (CUSTOM PROCEDURE TRAY) ×2 IMPLANT
PACK LITHOTOMY IV (CUSTOM PROCEDURE TRAY) ×2 IMPLANT
PAD CLEANER CAUTERY TIP 5X5 (MISCELLANEOUS)
PENCIL BUTTON HOLSTER BLD 10FT (ELECTRODE) ×2 IMPLANT
SHEARS HARMONIC 9CM CVD (BLADE) IMPLANT
SPONGE GAUZE 4X4 12PLY STER LF (GAUZE/BANDAGES/DRESSINGS) ×4 IMPLANT
SPONGE HEMORRHOID 8X3CM (HEMOSTASIS) ×1 IMPLANT
SPONGE SURGIFOAM ABS GEL 100 (HEMOSTASIS) IMPLANT
SURGILUBE 2OZ TUBE FLIPTOP (MISCELLANEOUS) ×5 IMPLANT
SUT CHROMIC 3 0 SH 27 (SUTURE) ×4 IMPLANT
SYR CONTROL 10ML LL (SYRINGE) ×2 IMPLANT
TOWEL OR 17X24 6PK STRL BLUE (TOWEL DISPOSABLE) ×4 IMPLANT
TOWEL OR NON WOVEN STRL DISP B (DISPOSABLE) ×2 IMPLANT
TRAY DSU PREP LF (CUSTOM PROCEDURE TRAY) ×2 IMPLANT
TRAY PROCTOSCOPIC FIBER OPTIC (SET/KITS/TRAYS/PACK) IMPLANT
TUBE CONNECTING 20X1/4 (TUBING) ×2 IMPLANT
UNDERPAD 30X30 INCONTINENT (UNDERPADS AND DIAPERS) ×2 IMPLANT
YANKAUER SUCT BULB TIP NO VENT (SUCTIONS) ×2 IMPLANT

## 2014-02-06 NOTE — H&P (View-Only) (Signed)
Patient ID: [name], female   DOB: 13-May-1934, 80 y.o.   MRN: [id number]  Chief Complaint  Patient presents with  . New Evaluation    eval perirectal mass right side    HPI [name] is a 80 y.o. female.  Chief complaint: Perianal mass HPI Approximately 2 weeks ago, the patient noticed a small mass extra anal area. It bleeds some when she does personal hygiene. No significant pain. It has not changed much over the 2 weeks. No significant change in bowel habits. Occasional itching in the area is also experienced. Past Medical History  Diagnosis Date  . Hypertension   . Pneumonia     hx of   . Arthritis     knees, hands   . Depression   . COPD (chronic obstructive pulmonary disease)   . Hyperlipidemia   . Osteoporosis     Past Surgical History  Procedure Laterality Date  . Cholecystectomy    . Abdominal hysterectomy    . Joint replacement      right and left hip replacement   . Other surgical history      arthroscopic right knee   . Eye surgery      left eye cataract surgery   . Other surgical history      right carpal tunnel  . Other surgical history      right elbow surgery   . Hernia repair      left inguinal hernia repair   . Other surgical history      oophorectomy 1958  . Total knee arthroplasty  12/13/2011    Procedure: TOTAL KNEE ARTHROPLASTY;  Surgeon: Tobi Bastos, MD;  Location: [facility];  Service: Orthopedics;  Laterality: Right;  . I&d extremity Left 03/05/2013    Procedure: Irrigation and Debridement Olecranon Bursa/Left Elbow;  Surgeon: Linna Hoff, MD;  Location: [place];  Service: Orthopedics;  Laterality: Left;    Family History  Problem Relation Age of Onset  . Hypertension Mother   . Cancer Father     lung  . Arthritis Father     Social History History  Substance Use Topics  . Smoking status: Current Every Day Smoker -- 0.50 packs/day for 40 years  . Smokeless tobacco: Never Used  . Alcohol Use: No    No Known  Allergies  Current Outpatient Prescriptions  Medication Sig Dispense Refill  . ALPRAZolam (XANAX) 0.25 MG tablet Take 0.25 mg by mouth daily as needed for anxiety. For anxiety      . aspirin EC 81 MG tablet Take 81 mg by mouth at bedtime.      . Cholecalciferol (VITAMIN D) 2000 UNITS CAPS Take 1 capsule by mouth at bedtime.      . docusate sodium (COLACE) 100 MG capsule Take 1 capsule (100 mg total) by mouth 2 (two) times daily.  30 capsule  0  . hydrochlorothiazide (HYDRODIURIL) 25 MG tablet Take 12.5 mg by mouth daily after breakfast.      . lovastatin (MEVACOR) 40 MG tablet Take 40 mg by mouth every evening.      . Multiple Vitamin (MULTIVITAMIN WITH MINERALS) TABS Take 1 tablet by mouth every morning.      . Multiple Vitamins-Minerals (OCUVITE PRESERVISION PO) Take by mouth 2 (two) times daily.      Marland Kitchen olmesartan (BENICAR) 20 MG tablet Take 20 mg by mouth daily after breakfast.      . venlafaxine (EFFEXOR-XR) 75 MG 24 hr capsule Take 75 mg  by mouth daily after breakfast.      . doxycycline (VIBRA-TABS) 100 MG tablet Take 1 tablet (100 mg total) by mouth 2 (two) times daily.  20 tablet  0   No current facility-administered medications for this visit.    Review of Systems Review of Systems  Constitutional: Negative for fever, chills and unexpected weight change.  HENT: Negative for congestion, hearing loss, sore throat, trouble swallowing and voice change.   Eyes: Negative for visual disturbance.  Respiratory: Negative for cough and wheezing.   Cardiovascular: Negative for chest pain, palpitations and leg swelling.  Gastrointestinal: Positive for anal bleeding. Negative for nausea, vomiting, abdominal pain, diarrhea, constipation, blood in stool and abdominal distention.       See history of present illness  Genitourinary: Negative for hematuria, vaginal bleeding and difficulty urinating.  Musculoskeletal: Negative for arthralgias.  Skin: Negative for rash and wound.  Neurological:  Negative for seizures, syncope and headaches.  Hematological: Negative for adenopathy. Does not bruise/bleed easily.  Psychiatric/Behavioral: Negative for confusion.    Blood pressure 130/80, pulse 74, temperature 97.8 F (36.6 C), temperature source Temporal, resp. rate 16, height 5' (1.524 m), weight 139 lb 12.8 oz (63.413 kg).  Physical Exam Physical Exam  Constitutional: She is oriented to person, place, and time. She appears well-developed and well-nourished.  HENT:  Head: Normocephalic and atraumatic.  Right Ear: External ear normal.  Left Ear: External ear normal.  Mouth/Throat: No oropharyngeal exudate.  Eyes: EOM are normal. Pupils are equal, round, and reactive to light.  Neck: Normal range of motion. Neck supple. No tracheal deviation present.  Cardiovascular: Normal rate, regular rhythm and normal heart sounds.   Pulmonary/Chest: Effort normal and breath sounds normal. No stridor. No respiratory distress. She has no wheezes.  Abdominal: Soft. Bowel sounds are normal. She exhibits no distension. There is no tenderness.  Genitourinary:  Rectal exam unremarkable, 1.5 cm disc-shaped right perianal mass, mildly friable, no active bleeding  Musculoskeletal: She exhibits no tenderness.  Neurological: She is alert and oriented to person, place, and time.  Skin: Skin is warm and dry.  Psychiatric: She has a normal mood and affect.    Data Reviewed Referral office notes  Assessment    Perianal mass    Plan    Worst case scenario, this may represent a squamous cell carcinoma. In any case the diagnosis is uncertain and I recommend proceeding with excision under general anesthesia. Procedure, risks, and benefits were discussed in detail the patient and her daughter. She is agreeable.       Zenovia Jarred 01/28/2014, 11:50 AM

## 2014-02-06 NOTE — Interval H&P Note (Signed)
History and Physical Interval Note:  02/06/2014 12:05 PM  [name]  has presented today for surgery, with the diagnosis of perianal mass  The various methods of treatment have been discussed with the patient and family. After consideration of risks, benefits and other options for treatment, the patient has consented to  Procedure(s): EXCISION PERIANAL MASS (N/A) as a surgical intervention .  The patient's history has been reviewed, patient examined, no change in status, stable for surgery.  I have reviewed the patient's chart and labs.  Questions were answered to the patient's satisfaction.     Zenovia Jarred

## 2014-02-06 NOTE — Anesthesia Preprocedure Evaluation (Addendum)
Anesthesia Evaluation  Patient identified by MRN, date of birth, ID band Patient awake    Reviewed: Allergy & Precautions, H&P , NPO status , Patient's Chart, lab work & pertinent test results  Airway Mallampati: II  TM Distance: >3 FB Neck ROM: Full    Dental no notable dental hx. (+) Teeth Intact, Dental Advisory Given   Pulmonary COPDCurrent Smoker,  breath sounds clear to auscultation  Pulmonary exam normal       Cardiovascular hypertension, Pt. on medications Rhythm:Regular Rate:Normal     Neuro/Psych Depression negative neurological ROS     GI/Hepatic negative GI ROS, Neg liver ROS,   Endo/Other  negative endocrine ROS  Renal/GU negative Renal ROS  negative genitourinary   Musculoskeletal   Abdominal   Peds  Hematology negative hematology ROS (+)   Anesthesia Other Findings   Reproductive/Obstetrics negative OB ROS                            Anesthesia Physical  Anesthesia Plan  ASA: II  Anesthesia Plan: General   Post-op Pain Management:    Induction: Intravenous  Airway Management Planned: LMA and Oral ETT  Additional Equipment:   Intra-op Plan:   Post-operative Plan: Extubation in OR  Informed Consent: I have reviewed the patients History and Physical, chart, labs and discussed the procedure including the risks, benefits and alternatives for the proposed anesthesia with the patient or authorized representative who has indicated his/her understanding and acceptance.   Dental advisory given  Plan Discussed with: CRNA  Anesthesia Plan Comments:         Anesthesia Quick Evaluation  

## 2014-02-06 NOTE — Transfer of Care (Signed)
Immediate Anesthesia Transfer of Care Note  Patient: [name]  Procedure(s) Performed: Procedure(s): EXCISION PERIANAL MASS (N/A)  Patient Location: PACU  Anesthesia Type:General  Level of Consciousness: awake, sedated and patient cooperative  Airway & Oxygen Therapy: Patient Spontanous Breathing and Patient connected to face mask oxygen  Post-op Assessment: Report given to PACU RN and Post -op Vital signs reviewed and stable  Post vital signs: Reviewed and stable  Complications: No apparent anesthesia complications

## 2014-02-06 NOTE — Op Note (Signed)
02/06/2014  12:52 PM  PATIENT:  [name]  80 y.o. female  PRE-OPERATIVE DIAGNOSIS:  perianal mass  POST-OPERATIVE DIAGNOSIS:  perianal mass  PROCEDURE:  Procedure(s): EXCISION PERIANAL MASS  SURGEON:  Surgeon(s): Zenovia Jarred, MD  ASSISTANTS: none   ANESTHESIA:   local and general  EBL:     BLOOD ADMINISTERED:none  DRAINS: none   SPECIMEN:  Excision  DISPOSITION OF SPECIMEN:  PATHOLOGY  COUNTS:  YES  DICTATION: .Dragon Dictation  Patient presents for excision of perianal mass. She was identified in the preop holding area. Informed consent was obtained. She received intravenous antibiotics.She was brought to the operating room and general anesthesia with laryngeal mask airway was administered by the anesthesia staff. She was placed in lithotomy position. Perianal region was prepped and draped in sterile fashion. We did a time out procedure. Local anesthetic was injected beneath this mass which was at approximately 8:00. Elliptical incision was made around the base of the mass to include some normal skin. The mass was excised completely and sent to pathology. Underlying tissues were cauterized for good hemostasis. Skin defect was closed with running 3-0 chromic. There was excellent hemostasis. All counts were correct. Patient tolerated procedure well without apparent complication and was taken recovery in stable condition.  PATIENT DISPOSITION:  PACU - hemodynamically stable.   Delay start of Pharmacological VTE agent (>24hrs) due to surgical blood loss or risk of bleeding:  no  Georganna Skeans, MD, MPH, FACS Pager: [phone]  4/17/201512:52 PM

## 2014-02-06 NOTE — Anesthesia Postprocedure Evaluation (Signed)
  Anesthesia Post-op Note  Patient: [name]  Procedure(s) Performed: Procedure(s): EXCISION PERIANAL MASS (N/A)  Patient Location: PACU  Anesthesia Type:General  Level of Consciousness: awake and alert   Airway and Oxygen Therapy: Patient Spontanous Breathing  Post-op Pain: none  Post-op Assessment: Post-op Vital signs reviewed, Patient's Cardiovascular Status Stable and Respiratory Function Stable  Post-op Vital Signs: Reviewed  Filed Vitals:   02/06/14 1315  BP:   Pulse: 62  Temp:   Resp: 16    Complications: No apparent anesthesia complications

## 2014-02-06 NOTE — Discharge Instructions (Signed)
Call your surgeon if you experience:   1.  Fever over 101.0. 2.  Inability to urinate. 3.  Nausea and/or vomiting. 4.  Extreme swelling or bruising at the surgical site. 5.  Continued bleeding from the incision. 6.  Increased pain, redness or drainage from the incision. 7.  Problems related to your pain medication.  Post Anesthesia Home Care Instructions  Activity: Get plenty of rest for the remainder of the day. A responsible adult should stay with you for 24 hours following the procedure.  For the next 24 hours, DO NOT: -Drive a car -Operate machinery -Drink alcoholic beverages -Take any medication unless instructed by your physician -Make any legal decisions or sign important papers.  Meals: Start with liquid foods such as gelatin or soup. Progress to regular foods as tolerated. Avoid greasy, spicy, heavy foods. If nausea and/or vomiting occur, drink only clear liquids until the nausea and/or vomiting subsides. Call your physician if vomiting continues.  Special Instructions/Symptoms: Your throat may feel dry or sore from the anesthesia or the breathing tube placed in your throat during surgery. If this causes discomfort, gargle with warm salt water. The discomfort should disappear within 24 hours.   

## 2014-02-09 ENCOUNTER — Telehealth (INDEPENDENT_AMBULATORY_CARE_PROVIDER_SITE_OTHER): Payer: Self-pay | Admitting: General Surgery

## 2014-02-09 NOTE — Telephone Encounter (Signed)
Pt called and was given pathology results.  She was reminded of her post op appointment with Dr. Grandville Silos.

## 2014-02-09 NOTE — Telephone Encounter (Signed)
Tried to call about pathology

## 2014-02-10 ENCOUNTER — Encounter (HOSPITAL_BASED_OUTPATIENT_CLINIC_OR_DEPARTMENT_OTHER): Payer: Self-pay | Admitting: General Surgery

## 2014-02-11 ENCOUNTER — Telehealth (INDEPENDENT_AMBULATORY_CARE_PROVIDER_SITE_OTHER): Payer: Self-pay | Admitting: General Surgery

## 2014-02-11 NOTE — Telephone Encounter (Signed)
Discussed pathology. She reports patient is doing well.

## 2014-02-11 NOTE — Telephone Encounter (Signed)
called regarding pathology

## 2014-02-17 ENCOUNTER — Telehealth (INDEPENDENT_AMBULATORY_CARE_PROVIDER_SITE_OTHER): Payer: Self-pay

## 2014-02-17 NOTE — Telephone Encounter (Signed)
Pt s/p removal of perianal mass by Dr. Grandville Silos on 02/06/14.  She has been doing well.  She has noticed the wound draining a light pink odorless fluid and wants to make sure that is normal.  I assured her that if their was no odor and the drainage remained light pink in color that that was normal post op drainage.  If color of drainage changes, or she notices an odor she should let us know.  Pt understood and agreed with POC.  Message forwarded to Dr. Grandville Silos.

## 2014-03-04 ENCOUNTER — Ambulatory Visit (INDEPENDENT_AMBULATORY_CARE_PROVIDER_SITE_OTHER): Payer: Medicare Other | Admitting: General Surgery

## 2014-03-04 VITALS — BP 160/84 | HR 62 | Temp 98.1°F | Resp 18 | Ht 60.0 in | Wt 141.0 lb

## 2014-03-04 DIAGNOSIS — C4492 Squamous cell carcinoma of skin, unspecified: Secondary | ICD-10-CM | POA: Insufficient documentation

## 2014-03-04 NOTE — Progress Notes (Signed)
Subjective:     Patient ID: [name], female   DOB: 11/28/1933, 80 y.o.   MRN: [id number]  HPI Patient presents status post excision of a perianal skin mass. She has had some drainage which is stopped. No other difficulties.  Review of Systems     Objective:   Physical Exam Wound is On the right buttock near the anal region,nearly healed. There is a mild separation with granulation tissue. It is clean. There is no evidence of infection.    Assessment:     1 status post excision of perianal squamous cell carcinoma    Plan:     Margins were clear. This seems to have been on the skin near the anus as opposed to an anal squamous cell carcinoma. That being said we will have her evaluated by oncology. Her family would like this and just to be sure that no further treatment needs to be done. I will see her back in 6 weeks.

## 2014-03-06 ENCOUNTER — Telehealth: Payer: Self-pay | Admitting: Oncology

## 2014-03-06 ENCOUNTER — Telehealth (INDEPENDENT_AMBULATORY_CARE_PROVIDER_SITE_OTHER): Payer: Self-pay

## 2014-03-06 NOTE — Telephone Encounter (Signed)
error 

## 2014-03-06 NOTE — Telephone Encounter (Signed)
C/D 03/06/14 for appt. 03/13/14

## 2014-03-06 NOTE — Telephone Encounter (Signed)
S/W PATIENT AND GAVE NP APPT FOR 05/22 @ 1:30 W/DR. SHADAD.  REFERRING DR. Grandville Silos DX- SQUAMOUS CELL CARCINOMA OF SKIN WELCOME PACKET MAILED

## 2014-03-12 ENCOUNTER — Telehealth: Payer: Self-pay | Admitting: Medical Oncology

## 2014-03-12 NOTE — Telephone Encounter (Signed)
Courtesy call to patient for appt reminder tomorrow @ 10:30. Requested patient bring current list of medications and informed her of free [place] parking. Patient denies questions.

## 2014-03-13 ENCOUNTER — Other Ambulatory Visit: Payer: Medicare Other

## 2014-03-13 ENCOUNTER — Encounter: Payer: Self-pay | Admitting: Oncology

## 2014-03-13 ENCOUNTER — Telehealth: Payer: Self-pay | Admitting: Oncology

## 2014-03-13 ENCOUNTER — Ambulatory Visit (HOSPITAL_BASED_OUTPATIENT_CLINIC_OR_DEPARTMENT_OTHER): Payer: Medicare Other | Admitting: Oncology

## 2014-03-13 ENCOUNTER — Ambulatory Visit: Payer: Medicare Other

## 2014-03-13 VITALS — BP 131/59 | HR 47 | Temp 97.8°F | Resp 18 | Ht <= 58 in | Wt 141.4 lb

## 2014-03-13 DIAGNOSIS — C4492 Squamous cell carcinoma of skin, unspecified: Secondary | ICD-10-CM

## 2014-03-13 DIAGNOSIS — I10 Essential (primary) hypertension: Secondary | ICD-10-CM

## 2014-03-13 DIAGNOSIS — J449 Chronic obstructive pulmonary disease, unspecified: Secondary | ICD-10-CM

## 2014-03-13 DIAGNOSIS — C44529 Squamous cell carcinoma of skin of other part of trunk: Secondary | ICD-10-CM

## 2014-03-13 DIAGNOSIS — M810 Age-related osteoporosis without current pathological fracture: Secondary | ICD-10-CM

## 2014-03-13 NOTE — Telephone Encounter (Signed)
gv adn printed appt sched and vs for pt fro June adn Aug....pt sched to see Dr. Isidore Moos on 6.1 @ 7:30am

## 2014-03-13 NOTE — Consult Note (Signed)
Reason for Referral: Squamous cell carcinoma of the skin.   HPI: 80 year old woman native of [place] where she lived all her life. She has a medical history significant for hypertension and COPD as well as osteoporosis. She had multiple joint replacements and orthopedic surgeries. She does not have any history of any skin cancer or lesions. 2 months ago, she started noticing a a lesion around the perianal area with some occasional bleeding. This was not associated with any pain or discomfort. She did not have any other associated symptoms. She was evaluated by her primary care physician Dr. Rex Kras and was referred to Dr. Grandville Silos from Gen. surgery. His examination noted normal rectal examination but a 1.5 cm right perianal mass noted at that time. On 02/06/2014 she underwent an excision of a perianal mass which was done successfully without any complications. The pathology showed squamous cell carcinoma with surgical margins appeared negative for carcinoma. The specimen described to be measuring 3.4 x 2.0 cm but the lesion measuring 2.5 x 2.1 x 0.7 cm. The patient is recovering well but does report some occasional pruritus around that surgical excision area. She has not reported any bleeding or major changes in her bowel habits. Clinically, she has felt well before that and since then. She did not report any constitutional symptoms of weight loss or appetite changes. She continued to live independently without any problems at this time. She has not reported any pelvic adenopathy or or abdominal masses.   Past Medical History  Diagnosis Date  . Hypertension   . Pneumonia     hx of   . Arthritis     knees, hands   . Depression   . COPD (chronic obstructive pulmonary disease)   . Hyperlipidemia   . Osteoporosis   . Elbow fracture, right 2013  . Wears glasses   :  Past Surgical History  Procedure Laterality Date  . Cholecystectomy    . Abdominal hysterectomy    . Joint replacement      right  and left hip replacement   . Other surgical history      arthroscopic right knee   . Eye surgery      left eye cataract surgery   . Other surgical history      right carpal tunnel  . Other surgical history      right elbow surgery   . Hernia repair      left inguinal hernia repair   . Other surgical history      oophorectomy 1958  . Total knee arthroplasty  12/13/2011    Procedure: TOTAL KNEE ARTHROPLASTY;  Surgeon: Tobi Bastos, MD;  Location: [facility];  Service: Orthopedics;  Laterality: Right;  . I&d extremity Left 03/05/2013    Procedure: Irrigation and Debridement Olecranon Bursa/Left Elbow;  Surgeon: Linna Hoff, MD;  Location: [facility];  Service: Orthopedics;  Laterality: Left;  . Elbow fracture surgery  2013    rt  . Hemorrhoid surgery N/A 02/06/2014    Procedure: EXCISION PERIANAL MASS;  Surgeon: Zenovia Jarred, MD;  Location: [facility];  Service: General;  Laterality: N/A;  :   Current Outpatient Prescriptions  Medication Sig Dispense Refill  . ALPRAZolam (XANAX) 0.25 MG tablet Take 0.25 mg by mouth daily as needed for anxiety. For anxiety      . aspirin EC 81 MG tablet Take 81 mg by mouth at bedtime.      . Cholecalciferol (VITAMIN D) 2000 UNITS CAPS Take 1 capsule  by mouth at bedtime.      . docusate sodium (COLACE) 100 MG capsule Take 1 capsule (100 mg total) by mouth 2 (two) times daily.  30 capsule  0  . hydrochlorothiazide (HYDRODIURIL) 25 MG tablet Take 12.5 mg by mouth daily after breakfast.      . losartan (COZAAR) 50 MG tablet Take 50 mg by mouth daily.      Marland Kitchen lovastatin (MEVACOR) 40 MG tablet Take 40 mg by mouth every evening.      . Multiple Vitamin (MULTIVITAMIN WITH MINERALS) TABS Take 1 tablet by mouth every morning.      . Multiple Vitamins-Minerals (OCUVITE PRESERVISION PO) Take by mouth 2 (two) times daily.      Marland Kitchen oxyCODONE-acetaminophen (ROXICET) 5-325 MG per tablet Take 1 tablet by mouth every 6 (six) hours as needed for severe pain  (pain).  30 tablet  0  . venlafaxine (EFFEXOR-XR) 75 MG 24 hr capsule Take 75 mg by mouth daily after breakfast.            No Known Allergies:  Family History  Problem Relation Age of Onset  . Hypertension Mother   . Cancer Father     lung  . Arthritis Father   : Her sister had colon cancer and she has a remote cousin has colon cancer as well.   History   Social History  . Marital Status: Widowed    Spouse Name: N/A    Number of Children: N/A  . Years of Education: N/A   Occupational History  . Not on file.   Social History Main Topics  . Smoking status: Current Every Day Smoker -- 0.50 packs/day for 40 years  . Smokeless tobacco: Never Used  . Alcohol Use: No  . Drug Use: No  . 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 She lives independently.  :  Constitutional: negative for anorexia, chills, fatigue and fevers Eyes: negative for icterus and irritation Ears, nose, mouth, throat, and face: negative for ear drainage, hoarseness and voice change Respiratory: positive for cough, negative for dyspnea on exertion, pleurisy/chest pain and wheezing Cardiovascular: negative for chest pain, orthopnea and paroxysmal nocturnal dyspnea Gastrointestinal: negative for abdominal pain, nausea and vomiting Genitourinary:negative for dysuria, frequency and hematuria Integument/breast: positive for pruritus, negative for dryness and rash Hematologic/lymphatic: negative for bleeding, easy bruising and lymphadenopathy Musculoskeletal:positive for arthralgias, negative for back pain and stiff joints Neurological: negative for dizziness, headaches and vertigo Behavioral/Psych: negative for anxiety and depression Endocrine: negative for temperature intolerance  Exam: ECOG 0 Blood pressure 131/59, pulse 47, temperature 97.8 F (36.6 C), temperature source Oral, resp. rate 18, height 4\' 10"  (1.473 m), weight 141 lb 6.4 oz (64.139 kg), SpO2  97.00%. General appearance: alert and cooperative Throat: lips, mucosa, and tongue normal; teeth and gums normal Neck: no adenopathy, supple, symmetrical, trachea midline and thyroid not enlarged, symmetric, no tenderness/mass/nodules Back: symmetric, no curvature. ROM normal. No CVA tenderness. Resp: clear to auscultation bilaterally Chest wall: no tenderness Cardio: regular rate and rhythm, S1, S2 normal, no murmur, click, rub or gallop GI: soft, non-tender; bowel sounds normal; no masses,  no organomegaly Extremities: extremities normal, atraumatic, no cyanosis or edema Skin: Skin color, texture, turgor normal. No rashes or lesions Lymph nodes: Cervical, supraclavicular, and axillary nodes normal. and No inguinal adenopathy.   Assessment and Plan:   80 year old woman with squamous cell carcinoma of the perianal area. She status postsurgical excision on 02/06/2014 in  the measurements indicate around 2.5 x 2.1 x 0.7 cm exophytic lesion. It appears to be originating from the skin rather than the anal area. The natural course of discussed a carcinoma of the skin was discussed with the patient and her daughter today. I explained to her that the surgical resection is the primary modality at this time. The question is whether she will need additional treatments. She still he will not require any adjuvant chemotherapy at this time but she should be considered for at least an evaluation for possible radiation therapy. Due to the size and the location of the lesion this might place her at a higher risk of developing local recurrence and I think is worth having a discussion about the role of adjuvant radiation therapy at this time. She has no clinical signs of symptoms of regional lymphadenopathy spread by examination at this time. I will refer her for radiation oncology evaluation today. In terms of future surveillance, she will continue to need active surveillance with physical examination and  occasional imaging studies to rule out recurrence at that time. LC her back in 3 months sooner if needed to.

## 2014-03-13 NOTE — Progress Notes (Signed)
Please see consult note.  

## 2014-03-13 NOTE — Progress Notes (Signed)
chekced in new patient with no financial issues and she has not seen her dr as of yet. She has not been out of the country,

## 2014-03-18 NOTE — Progress Notes (Signed)
Histology and Location of Primary Skin Cancer: Squamous Cell Carcinoma of the Perianal Area   Patient presented with squamous Cell Carcinoma of the perianal Area. "It appears to be originating from the skin rather than the anal area."  2 months ago, she started noticing a a lesion around the perianal area with some occasional bleeding. This was not associated with any pain or discomfort. She did not have any other associated symptoms. She was evaluated by her primary care physician Dr. Rex Kras and was referred to Dr. Grandville Silos from Gen. surgery. His examination noted normal rectal examination but a 1.5 cm right perianal mass noted at that time. On 02/06/2014 she underwent an excision of a perianal mass which was done successfully without any complications. The pathology showed squamous cell carcinoma with surgical margins appeared negative for carcinoma. The specimen described to be measuring 3.4 x 2.0 cm but the lesion measuring 2.5 x 2.1 x 0.7 cm. The patient is recovering well but does report some occasional pruritus around that surgical excision area.  Past/Anticipated interventions by patient's surgeon/dermatologist for current problematic lesion, if any: Excision of the perianal mass, Dr. Grandville Silos  Past skin cancers, if any: She does not have any history of any skin cancer or lesions.   1) Location/Histology/Intervention:   2) Location/Histology/Intervention:   3) Location/Histology/Intervention:   History of Blistering sunburns, if any:  SAFETY ISSUES:  Prior radiation? No  Pacemaker/ICD? No  Possible current pregnancy? No  Is the patient on methotrexate? No  Current Complaints / other details: Anal Pruritis since excision, sister colon cancer dx age 10,deceased 33,, father lung ca, smoker, deceased, daughter thyroid cancer dx age 35, living age 34,

## 2014-03-23 ENCOUNTER — Ambulatory Visit
Admission: RE | Admit: 2014-03-23 | Discharge: 2014-03-23 | Disposition: A | Discharge status: Home / Self Care | Payer: Medicare Other | Source: Ambulatory Visit | Attending: Radiation Oncology | Admitting: Radiation Oncology

## 2014-03-23 ENCOUNTER — Encounter: Payer: Self-pay | Admitting: Radiation Oncology

## 2014-03-23 VITALS — BP 107/69 | HR 55 | Temp 97.9°F | Resp 16 | Ht 59.0 in | Wt 141.7 lb

## 2014-03-23 DIAGNOSIS — F411 Generalized anxiety disorder: Secondary | ICD-10-CM | POA: Diagnosis not present

## 2014-03-23 DIAGNOSIS — I10 Essential (primary) hypertension: Secondary | ICD-10-CM | POA: Diagnosis not present

## 2014-03-23 DIAGNOSIS — Z90710 Acquired absence of both cervix and uterus: Secondary | ICD-10-CM | POA: Insufficient documentation

## 2014-03-23 DIAGNOSIS — E785 Hyperlipidemia, unspecified: Secondary | ICD-10-CM | POA: Insufficient documentation

## 2014-03-23 DIAGNOSIS — F3289 Other specified depressive episodes: Secondary | ICD-10-CM | POA: Insufficient documentation

## 2014-03-23 DIAGNOSIS — C44529 Squamous cell carcinoma of skin of other part of trunk: Secondary | ICD-10-CM | POA: Diagnosis not present

## 2014-03-23 DIAGNOSIS — Z9089 Acquired absence of other organs: Secondary | ICD-10-CM | POA: Insufficient documentation

## 2014-03-23 DIAGNOSIS — Z510 Encounter for antineoplastic radiation therapy: Secondary | ICD-10-CM | POA: Insufficient documentation

## 2014-03-23 DIAGNOSIS — Z96659 Presence of unspecified artificial knee joint: Secondary | ICD-10-CM | POA: Diagnosis not present

## 2014-03-23 DIAGNOSIS — Z96649 Presence of unspecified artificial hip joint: Secondary | ICD-10-CM | POA: Diagnosis not present

## 2014-03-23 DIAGNOSIS — Z7982 Long term (current) use of aspirin: Secondary | ICD-10-CM | POA: Diagnosis not present

## 2014-03-23 DIAGNOSIS — C44520 Squamous cell carcinoma of anal skin: Secondary | ICD-10-CM

## 2014-03-23 DIAGNOSIS — F17200 Nicotine dependence, unspecified, uncomplicated: Secondary | ICD-10-CM | POA: Insufficient documentation

## 2014-03-23 DIAGNOSIS — F329 Major depressive disorder, single episode, unspecified: Secondary | ICD-10-CM | POA: Diagnosis not present

## 2014-03-23 DIAGNOSIS — C4492 Squamous cell carcinoma of skin, unspecified: Secondary | ICD-10-CM

## 2014-03-23 HISTORY — DX: Anxiety disorder, unspecified: F41.9

## 2014-03-23 LAB — BUN AND CREATININE (CC13)
BUN: 12.1 mg/dL (ref 7.0–26.0)
Creatinine: 0.8 mg/dL (ref 0.6–1.1)

## 2014-03-23 NOTE — Progress Notes (Signed)
Radiation Oncology         [phone] ________________________________  Initial outpatient Consultation  Name: [name] MRN: [id number]  Date: 03/23/2014  DOB: Mar 08, 1934  [id number] [name], MD  Wyatt Portela, MD   REFERRING PHYSICIAN: Wyatt Portela, MD  DIAGNOSIS: Squamous Cell Carcinoma of perianal skin - cT2N0M0  HISTORY OF PRESENT ILLNESS::[name] is a 80 y.o. female who presented in the month of March with a small mass in the right perianal region. She says it immediately adjacent to the anus.  It didn't bleed at times when she wiped herself. Also associated with pruritus. She saw Gen. surgery for this. According to Dr. Biagio Borg notes, the rectal exam was unremarkable but there was a 1.5 cm disc shaped right anal mass which was mildly friable with no active bleeding. Excision was recommended. This took place on 02-06-14.  Pathology on 4-17 revealed:  Perianus - SQUAMOUS CELL CARCINOMA. - THE SURGICAL RESECTION MARGINS APPEAR NEGATIVE FOR CARCINOMA. The specimen is received in formalin and consists of an unoriented ellipse of tan pink skin, measuring 3.4 x2.0 cm, and excised to a depth of 0.6 cm. The skin's surface displays a 2.5 x 2.1 x 0.7 cm tan pink, wrinkled,exophytic lesion. The lesion abuts the closest soft tissue resection margin.  I contacted Dr. Lyndon Code for more details re: HPV status, differentiation, etc. Per Dr Ulis Rias review, the specimen is: Well differentiated.  0.1 cm (1 mm) to nearest non oriented margin.  HPV status will be out on 03-25-14.   She saw Dr. Grandville Silos for followup on May 13th. The wound on her right buttock near the anal region was nearly healed at that time with no evidence of infection. Although margins were clear, recommendations were made for consultations with medical and radiation oncology to rule in or rule out  adjuvant therapy. In Dr. Biagio Borg assessment this seems to have been on the skin near the anus as opposed to an anal  squamous cell carcinoma.  On May 22nd she saw Dr. Alen Blew. He did not recommend any systemic therapy. Of note, no CT or PET scans have been performed. She will see Dr. Alen Blew in 3 months for followup.  She is a current smoker. No prior cancers. She underwent hysterectomy decades ago for fibroid.  No recent pap smears.  She has no known HPV history or abnormal paps to her knowledge.  No history of anal or genital warts. She has urinary urgency, but no dysuria, or bleeding.  No GI function complaints.    PREVIOUS RADIATION THERAPY: No  PAST MEDICAL HISTORY:  has a past medical history of Hypertension; Pneumonia; Arthritis; Depression; COPD (chronic obstructive pulmonary disease); Hyperlipidemia; Osteoporosis; Elbow fracture, right (2013); Wears glasses; and Anxiety.    PAST SURGICAL HISTORY: Past Surgical History  Procedure Laterality Date  . Cholecystectomy    . Abdominal hysterectomy    . Joint replacement      right and left hip replacement   . Other surgical history      arthroscopic right knee   . Eye surgery      left eye cataract surgery   . Other surgical history      right carpal tunnel  . Other surgical history      right elbow surgery   . Hernia repair      left inguinal hernia repair   . Other surgical history      oophorectomy 1958  . Total knee arthroplasty  12/13/2011    Procedure:  TOTAL KNEE ARTHROPLASTY;  Surgeon: Tobi Bastos, MD;  Location: [facility];  Service: Orthopedics;  Laterality: Right;  . I&d extremity Left 03/05/2013    Procedure: Irrigation and Debridement Olecranon Bursa/Left Elbow;  Surgeon: Linna Hoff, MD;  Location: [place];  Service: Orthopedics;  Laterality: Left;  . Elbow fracture surgery  2013    rt  . Hemorrhoid surgery N/A 02/06/2014    Procedure: EXCISION PERIANAL MASS;  Surgeon: Zenovia Jarred, MD;  Location: [facility];  Service: General;  Laterality: N/A;  . Appendectomy      FAMILY HISTORY: family history includes  Arthritis in her father; Cancer in her father; Cancer (age of onset: 87) in her child; Cancer (age of onset: 43) in her sister; Hypertension in her mother.  SOCIAL HISTORY:  reports that she has been smoking Cigarettes.  She has a 20 pack-year smoking history. She has never used smokeless tobacco. She reports that she does not drink alcohol or use illicit drugs.  ALLERGIES: Review of patient's allergies indicates no known allergies.  MEDICATIONS:  Current Outpatient Prescriptions  Medication Sig Dispense Refill  . ALPRAZolam (XANAX) 0.25 MG tablet Take 0.25 mg by mouth 2 (two) times daily as needed for anxiety. For anxiety      . aspirin EC 81 MG tablet Take 81 mg by mouth at bedtime.      . Cholecalciferol (VITAMIN D) 2000 UNITS CAPS Take 1 capsule by mouth at bedtime.      . docusate sodium (COLACE) 100 MG capsule Take 100 mg by mouth daily as needed.      . hydrochlorothiazide (HYDRODIURIL) 25 MG tablet Take 12.5 mg by mouth daily after breakfast.      . losartan (COZAAR) 50 MG tablet Take 50 mg by mouth daily.      Marland Kitchen lovastatin (MEVACOR) 40 MG tablet Take 40 mg by mouth every evening.      . Multiple Vitamin (MULTIVITAMIN WITH MINERALS) TABS Take 1 tablet by mouth every morning.      . Multiple Vitamins-Minerals (OCUVITE PRESERVISION PO) Take by mouth 2 (two) times daily.      Marland Kitchen oxyCODONE-acetaminophen (ROXICET) 5-325 MG per tablet Take 1 tablet by mouth every 6 (six) hours as needed for severe pain (pain).  30 tablet  0  . venlafaxine (EFFEXOR-XR) 75 MG 24 hr capsule Take 75 mg by mouth daily after breakfast.       No current facility-administered medications for this encounter.    REVIEW OF SYSTEMS:  Notable for that above.   PHYSICAL EXAM:  height is 4\' 11"  (1.499 m) and weight is 141 lb 11.2 oz (64.275 kg). Her oral temperature is 97.9 F (36.6 C). Her blood pressure is 107/69 and her pulse is 55. Her respiration is 16.   General: Alert and oriented, in no acute distress HEENT:  Head is normocephalic. 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  Oropharynx is clear. Neck: Neck is supple, no palpable cervical or supraclavicular lymphadenopathy. Heart: skipped beats, otherwise regular, with no murmurs  Chest: Clear to auscultation bilaterally, with no rhonchi, wheezes, or rales. Abdomen: Soft, nontender, nondistended, with no rigidity or guarding. Extremities: No cyanosis or edema. Lymphatics: No concerning lymphadenopathy in neck or groin areas. Skin: No concerning lesions. Musculoskeletal: symmetric strength and muscle tone throughout. Neurologic: Cranial nerves II through XII are grossly intact with exception to right eye as above. No obvious focalities. Speech is fluent. Coordination is intact. Psychiatric: Judgment and insight are intact.  Affect is appropriate. GYN: No cervical or vaginal or external genetalia lesions appreciated on speculum exam DRE: No anorectal lesions appreciated. Right perianal erythema consistent with postoperative healing.  Skin intact. No sign of local recurrence.   ECOG = 0  0 - Asymptomatic (Fully active, able to carry on all predisease activities without restriction)  1 - Symptomatic but completely ambulatory (Restricted in physically strenuous activity but ambulatory and able to carry out work of a light or sedentary nature. For example, light housework, office work)  2 - Symptomatic, <50% in bed during the day (Ambulatory and capable of all self care but unable to carry out any work activities. Up and about more than 50% of waking hours)  3 - Symptomatic, >50% in bed, but not bedbound (Capable of only limited self-care, confined to bed or chair 50% or more of waking hours)  4 - Bedbound (Completely disabled. Cannot carry on any self-care. Totally confined to bed or chair)  5 - Death   Eustace Pen MM, Creech RH, Tormey DC, et al. [phone]). "Toxicity and response criteria of the [facility]". [place]  Oncol. 5 (6): 649-55   LABORATORY DATA:  Lab Results  Component Value Date   WBC 11.0* 03/05/2013   HGB 16.3* 02/06/2014   HCT 47.6* 03/05/2013   MCV 94.6 03/05/2013   PLT 203 03/05/2013   CMP     Component Value Date/Time   NA 142 02/03/2014 1400   K 4.1 02/03/2014 1400   CL 98 02/03/2014 1400   CO2 29 02/03/2014 1400   GLUCOSE 128* 02/03/2014 1400   BUN 12.1 03/23/2014 0922   BUN 15 02/03/2014 1400   CREATININE 0.8 03/23/2014 0922   CREATININE 0.69 02/03/2014 1400   CALCIUM 11.0* 02/03/2014 1400   PROT 6.8 03/05/2013 1627   ALBUMIN 3.2* 03/05/2013 1627   AST 21 03/05/2013 1627   ALT 18 03/05/2013 1627   ALKPHOS 62 03/05/2013 1627   BILITOT 0.3 03/05/2013 1627   GFRNONAA 82* 02/03/2014 1400   GFRAA >90 02/03/2014 1400        RADIOGRAPHY: No results found.    IMPRESSION/PLAN: This is a lovely 80 yo old woman with Stage II pT2Nx clinical T2N0M0 right anal margin squamous cell carcinoma, Well differentiated. 0.1 cm (1 mm) to nearest non oriented margin.  HPV status will be out on 03-25-14.   I explained to her and her daughter that this is a rare type of cancer.  I reviewed the NCCN guidelines on anal margin tumors as well as pertinent medical literature and told her that according to the guidelines and literature she will probably need some more procedures as part of her work up.  The following issues may be discussed at our GI tumor board to finalize her disposition:  Anoscopy:  She has not had one yet to rule out anal lesions - DRE is negative. Per NCCN, this should be done.  I am referring her back to general surgery - if Dr. Grandville Silos does not perform this procedure, perhaps a colleague can do this in his group. I haven't been able to reach him by phone but left my pager number with his staff so I can discuss with him further.  GYN exam: should be done to establish baseline prior to RT - and particularly, she needs pap smear as this is likely an HPV related cancer. This is in keeping with the NCCN  guidelines. Will refer to gynecology.  PATH: appreciated Dr Ulis Rias review of  the path, verifying this is  Well differentiated. 0.1 cm (1 mm) to nearest non oriented margin.  HPV status will be out on 03-25-14.  IMAGING: This could include CT of CAP vs PET, per NCCN.  I am favoring pursuing a CT of the CAP.   Radiotherapy:  I would recommend radiotherapy due to the close margins and the size of her tumor.  I plan to give at total dose 45 Gy in 25 fractions over 5 weeks.  I do not think that her surgery alone would be adequate for long term local control. If staging scans are negative, I think that elective RT to the inguinal nodes may still play a role is reducing risk of regional failure.  This is somewhat of a "gray area" for adjuvant treatment of well differentiated T2N0 disease but worth considering as it should not increase her side effects drastically. She is scheduled for simulation planning on 6/5, but RT would not begin until the above issues are addressed.  She and her daughter are enthusiastic about this plan.  Risks / benefits and side effects of RT were discussed. Side effects may include but not necessarily be limited to: fatigue, skin irritation, dysuria.  Consent was signed today.  The patient continues to use tobacco. The patient was counseled to stop using tobacco and was offered pharmacotherapy to help with this. The patient declined pharmacotherapy at this time but may try to quit on her own.    __________________________________________   Eppie Gibson, MD

## 2014-03-23 NOTE — Progress Notes (Signed)
Please see the Nurse Progress Note in the MD Initial Consult Encounter for this patient. 

## 2014-03-24 ENCOUNTER — Telehealth: Payer: Self-pay | Admitting: *Deleted

## 2014-03-24 ENCOUNTER — Encounter: Payer: Self-pay | Admitting: Radiation Oncology

## 2014-03-24 ENCOUNTER — Encounter: Payer: Self-pay | Admitting: *Deleted

## 2014-03-24 NOTE — Progress Notes (Unsigned)
[place] Psychosocial Distress Screening Clinical Social Work  Clinical Social Work was referred by distress screening protocol.  The patient scored a 5 on the Psychosocial Distress Thermometer which indicates moderate distress. Clinical Social Worker contacted patient by phone to assess for distress and other psychosocial needs.  The patient states her distress level is about the same but feels optimistic about cancer treatment.  She stated she is somewhat anxious to meet with Dr. Isidore Moos to learn outcome of discussion at GI conference.  CSW briefly discussed support services available to patient if interested.  ONCBCN DISTRESS SCREENING 03/23/2014  Screening Type Initial Screening  Elta [place] the number that describes how much distress you have been experiencing in the past week 5  Family Problem type Children;Other (comment)  Emotional problem type Depression;Adjusting to illness  Physician notified of physical symptoms Yes  Referral to clinical social work Yes    Clinical Social Worker follow up needed: no  If yes, follow up plan:   Polo Riley, MSW, LCSW, OSW-C Clinical Social Worker [facility] [phone]

## 2014-03-24 NOTE — Telephone Encounter (Signed)
Called patient to inform of appt. With Dr. Toney Rakes on 04-02-14- arrival time - 3:40 pm, spoke with patient and she is aware of this appt.

## 2014-03-24 NOTE — Telephone Encounter (Signed)
CALLED PATIENT TO INFORM OF APPT. WITH DR. Lavone Neri THOMPSON ON 03-25-14- ARRIVAL TIME - 8:45 AM, SPOKE WITH PATIENT AND SHE IS AWARE OF THIS APPT.

## 2014-03-24 NOTE — Telephone Encounter (Signed)
Called patient to inform of test, lvm for a return call 

## 2014-03-25 ENCOUNTER — Encounter (INDEPENDENT_AMBULATORY_CARE_PROVIDER_SITE_OTHER): Payer: Self-pay | Admitting: General Surgery

## 2014-03-25 ENCOUNTER — Ambulatory Visit (INDEPENDENT_AMBULATORY_CARE_PROVIDER_SITE_OTHER): Payer: Medicare Other | Admitting: General Surgery

## 2014-03-25 VITALS — BP 124/81 | HR 81 | Temp 97.5°F | Resp 16 | Ht 59.5 in | Wt 141.0 lb

## 2014-03-25 DIAGNOSIS — C44529 Squamous cell carcinoma of skin of other part of trunk: Secondary | ICD-10-CM

## 2014-03-25 DIAGNOSIS — C44520 Squamous cell carcinoma of anal skin: Secondary | ICD-10-CM

## 2014-03-25 NOTE — Progress Notes (Addendum)
Subjective:     Patient ID: [name], female   DOB: 1934-02-16, 80 y.o.   MRN: [id number]  HPI Patient status post excision of perianal mass that ended up being an anal margin squamous cell carcinoma. She has undergone evaluation for adjuvant radiation therapy by Dr. Isidore Moos. Anoscopy is required according to national guidelines. We brought her in today to perform this.  Review of Systems     Objective:   Physical Exam Incision site is well healed. External anal skin tags are stable. Digital rectal exam was unremarkable. Anoscopy was then performed showing some small internal hemorrhoids but no warts or other internal anal lesions. She tolerated this well.    Assessment:     No new lesions detected on anoscopy    Plan:     Proceed with adjuvant radiotherapy as planned by Dr. Isidore Moos.

## 2014-03-26 ENCOUNTER — Ambulatory Visit (HOSPITAL_COMMUNITY)
Admission: RE | Admit: 2014-03-26 | Discharge: 2014-03-26 | Disposition: A | Discharge status: Home / Self Care | Payer: Medicare Other | Source: Ambulatory Visit | Attending: Radiation Oncology | Admitting: Radiation Oncology

## 2014-03-26 ENCOUNTER — Encounter (HOSPITAL_COMMUNITY): Payer: Self-pay

## 2014-03-26 DIAGNOSIS — M949 Disorder of cartilage, unspecified: Secondary | ICD-10-CM

## 2014-03-26 DIAGNOSIS — M545 Low back pain, unspecified: Secondary | ICD-10-CM | POA: Insufficient documentation

## 2014-03-26 DIAGNOSIS — J438 Other emphysema: Secondary | ICD-10-CM | POA: Insufficient documentation

## 2014-03-26 DIAGNOSIS — Z96659 Presence of unspecified artificial knee joint: Secondary | ICD-10-CM | POA: Insufficient documentation

## 2014-03-26 DIAGNOSIS — C4492 Squamous cell carcinoma of skin, unspecified: Secondary | ICD-10-CM

## 2014-03-26 DIAGNOSIS — I700 Atherosclerosis of aorta: Secondary | ICD-10-CM | POA: Insufficient documentation

## 2014-03-26 DIAGNOSIS — K449 Diaphragmatic hernia without obstruction or gangrene: Secondary | ICD-10-CM | POA: Insufficient documentation

## 2014-03-26 DIAGNOSIS — K5730 Diverticulosis of large intestine without perforation or abscess without bleeding: Secondary | ICD-10-CM | POA: Insufficient documentation

## 2014-03-26 DIAGNOSIS — Z9089 Acquired absence of other organs: Secondary | ICD-10-CM | POA: Insufficient documentation

## 2014-03-26 DIAGNOSIS — Z90710 Acquired absence of both cervix and uterus: Secondary | ICD-10-CM | POA: Insufficient documentation

## 2014-03-26 DIAGNOSIS — M899 Disorder of bone, unspecified: Secondary | ICD-10-CM | POA: Insufficient documentation

## 2014-03-26 DIAGNOSIS — I517 Cardiomegaly: Secondary | ICD-10-CM | POA: Insufficient documentation

## 2014-03-26 DIAGNOSIS — C44529 Squamous cell carcinoma of skin of other part of trunk: Secondary | ICD-10-CM | POA: Insufficient documentation

## 2014-03-26 DIAGNOSIS — K5710 Diverticulosis of small intestine without perforation or abscess without bleeding: Secondary | ICD-10-CM | POA: Insufficient documentation

## 2014-03-26 DIAGNOSIS — C44520 Squamous cell carcinoma of anal skin: Secondary | ICD-10-CM

## 2014-03-26 DIAGNOSIS — I2510 Atherosclerotic heart disease of native coronary artery without angina pectoris: Secondary | ICD-10-CM | POA: Insufficient documentation

## 2014-03-26 MED ORDER — IOHEXOL 300 MG/ML  SOLN
80.0000 mL | Freq: Once | INTRAMUSCULAR | Status: AC | PRN
  Administered 2014-03-26: 100 mL via INTRAVENOUS

## 2014-03-27 ENCOUNTER — Ambulatory Visit
Admission: RE | Admit: 2014-03-27 | Discharge: 2014-03-27 | Disposition: A | Discharge status: Home / Self Care | Payer: Medicare Other | Source: Ambulatory Visit | Attending: Radiation Oncology | Admitting: Radiation Oncology

## 2014-03-27 ENCOUNTER — Ambulatory Visit: Payer: Medicare Other

## 2014-03-27 ENCOUNTER — Telehealth: Payer: Self-pay | Admitting: *Deleted

## 2014-03-27 DIAGNOSIS — C44520 Squamous cell carcinoma of anal skin: Secondary | ICD-10-CM

## 2014-03-27 DIAGNOSIS — Z510 Encounter for antineoplastic radiation therapy: Secondary | ICD-10-CM | POA: Diagnosis not present

## 2014-03-27 NOTE — Telephone Encounter (Signed)
Called patient to inform that patient doesn't need to arrive till 2:45 pm today, lvm for a return call

## 2014-03-30 ENCOUNTER — Other Ambulatory Visit: Payer: Self-pay | Admitting: Radiation Oncology

## 2014-03-30 ENCOUNTER — Encounter: Payer: Self-pay | Admitting: Radiation Oncology

## 2014-03-30 DIAGNOSIS — C44520 Squamous cell carcinoma of anal skin: Secondary | ICD-10-CM

## 2014-03-30 NOTE — Progress Notes (Signed)
After reviewing the patient's CT images from her pelvic treatment planning session on 03-27-14, I realized that one right inguinal node is at least 1.9cm in greatest axial dimension. It appears that there is less artifact obscuring the node than at her diagnostic scan earlier in the week. I spoke with radiology about this and they can biopsy the node with US guidance.  If the node is negative, I plan to treat her tumor bed only.  If the node is positive, she will need RT to the inguinal regions as well.  She may also need systemic therapy, +/- inguinal surgery.  We will try to get the results ASAP. I have spoken to the pt and her daughter about this plan.  -----------------------------------  Eppie Gibson, MD

## 2014-03-31 NOTE — Progress Notes (Signed)
  Radiation Oncology         (336) [phone] ________________________________  Name: [name] MRN: [id number]  Date: 03/27/2014  DOB: [birth year]  SIMULATION AND TREATMENT PLANNING NOTE 03-27-14  Outpatient  DIAGNOSIS:  Right anal margin carcinoma    ICD-9-CM  1. Squamous cell carcinoma of anal margin 173.52     NARRATIVE:  The patient was brought to the [facility].  Identity was confirmed.  All relevant records and images related to the planned course of therapy were reviewed.  The patient freely provided informed written consent to proceed with treatment after reviewing the details related to the planned course of therapy. The consent form was witnessed and verified by the simulation staff.    Then, the patient was set-up in a stable reproducible  Supine froglegged position for radiation therapy with her legs in a VACLOCK. Marker placed at surgical bed at right perianal skin.  CT images were obtained.  Surface markings were placed.  The CT images were loaded into the planning software.    TREATMENT PLANNING NOTE: Treatment planning then occurred.  The radiation prescription was entered and confirmed.    After reviewing the patient's CT images from her pelvic treatment planning session on 03-27-14, I realized that one right inguinal node is at least 1.9cm in greatest axial dimension. It appears that there is less artifact obscuring the node than at her diagnostic scan earlier in the week. I spoke with radiology about this and they can biopsy the node with US guidance. If the node is negative, I plan to treat her tumor bed only. If the node is positive, she will need RT to the inguinal regions as well. She may also need systemic therapy, +/- inguinal surgery. We will try to get the results ASAP. I have spoken to the pt and her daughter about this plan.  A total of 1 medically necessary complex treatment devices were fabricated and supervised by me - vaclock. I have requested :  Intensity Modulated Radiotherapy (IMRT) is medically necessary for this case for the following reason:  Rectal sparing, bladder sparing, genitalia sparing.  I have ordered:Diode first day of treatment  The patient will receive 45 Gy in 25 fractions to the right perianal tumor bed. If her right inguinal biopsy is positive, 45Gy in 25 fractions may be given to the bilateral inguinal regions with 50 Gy in 25 fractions to the enlarged node.  NOTE: Insurance would not cover Chest CT for staging, but Chest Xray was negative for metastases in May.  -----------------------------------  Eppie Gibson, MD

## 2014-04-02 ENCOUNTER — Other Ambulatory Visit (HOSPITAL_COMMUNITY)
Admission: RE | Admit: 2014-04-02 | Discharge: 2014-04-02 | Disposition: A | Discharge status: Home / Self Care | Payer: Medicare Other | Source: Ambulatory Visit | Attending: Gynecology | Admitting: Gynecology

## 2014-04-02 ENCOUNTER — Ambulatory Visit (INDEPENDENT_AMBULATORY_CARE_PROVIDER_SITE_OTHER): Payer: Medicare Other | Admitting: Gynecology

## 2014-04-02 ENCOUNTER — Encounter: Payer: Self-pay | Admitting: Gynecology

## 2014-04-02 VITALS — BP 120/76 | Ht 59.0 in | Wt 130.0 lb

## 2014-04-02 DIAGNOSIS — Z1151 Encounter for screening for human papillomavirus (HPV): Secondary | ICD-10-CM | POA: Insufficient documentation

## 2014-04-02 DIAGNOSIS — Z124 Encounter for screening for malignant neoplasm of cervix: Secondary | ICD-10-CM | POA: Insufficient documentation

## 2014-04-02 DIAGNOSIS — N951 Menopausal and female climacteric states: Secondary | ICD-10-CM

## 2014-04-02 DIAGNOSIS — Z1272 Encounter for screening for malignant neoplasm of vagina: Secondary | ICD-10-CM

## 2014-04-02 DIAGNOSIS — M810 Age-related osteoporosis without current pathological fracture: Secondary | ICD-10-CM | POA: Insufficient documentation

## 2014-04-02 NOTE — Progress Notes (Signed)
[name] October 01, 1934 [id number]   History:    80 y.o.  is a new patient to the practice. She was referred to our practice by Dr. Isidore Moos her radiation oncologist for GYN exam and Pap smear. Dr. Clarisa Schools general surgeon had done a wide local excision I went perianal mass the pathology report demonstrated that the margin there was squamous cell carcinoma. She is in the process of having inguinal node dissection followed by radiation next week.  Patient denies any past history of abnormal Pap smear. Many years ago patient had a transvaginal hysterectomy with bilateral salpingo-oophorectomy for symptomatic leiomyomatous uteri. She stated her colonoscopy 2 years ago was normal but that she has had history in the past of benign colon polyps.  It was noted that her last bone density study was in 2012 and her distal one third radius had a T score of -3.1 consistent with osteoporosis. Patient stated that her PCP Dr. Hulan Fess had her in the past on oral bisphosphonate but she discontinued because of joint pains. She is currently only taking calcium and vitamin D. Patient states that her Tdap, shingles and Pneumovax vaccine are all up-to-date. Her PCP has been doing her blood work as well.    Past medical history,surgical history, family history and social history were all reviewed and documented in the EPIC chart.  Gynecologic History No LMP recorded. Patient has had a hysterectomy. Contraception: status post hysterectomy Last Pap: ??. Results were: normal Last mammogram: 2012. Results were: normal  Obstetric History OB History  Gravida Para Term Preterm AB SAB TAB Ectopic Multiple Living  4 4        4     # Outcome Date GA Lbr Len/2nd Weight Sex Delivery Anes PTL Lv  4 PAR           3 PAR           2 PAR           1 PAR                ROS: A ROS was performed and pertinent positives and negatives are included in the history.  GENERAL: No fevers or chills. HEENT: No change in  vision, no earache, sore throat or sinus congestion. NECK: No pain or stiffness. CARDIOVASCULAR: No chest pain or pressure. No palpitations. PULMONARY: No shortness of breath, cough or wheeze. GASTROINTESTINAL: No abdominal pain, nausea, vomiting or diarrhea, melena or bright red blood per rectum. GENITOURINARY: No urinary frequency, urgency, hesitancy or dysuria. MUSCULOSKELETAL: No joint or muscle pain, no back pain, no recent trauma. DERMATOLOGIC: No rash, no itching, no lesions. ENDOCRINE: No polyuria, polydipsia, no heat or cold intolerance. No recent change in weight. HEMATOLOGICAL: No anemia or easy bruising or bleeding. NEUROLOGIC: No headache, seizures, numbness, tingling or weakness. PSYCHIATRIC: No depression, no loss of interest in normal activity or change in sleep pattern.     Exam: chaperone present  BP 120/76  Ht 4\' 11"  (1.499 m)  Wt 130 lb (58.968 kg)  BMI 26.24 kg/m2  Body mass index is 26.24 kg/(m^2).  General appearance : Well developed well nourished female. No acute distress HEENT: Neck supple, trachea midline, no carotid bruits, no thyroidmegaly Lungs: Clear to auscultation, no rhonchi or wheezes, or rib retractions  Heart: Regular rate and rhythm, no murmurs or gallops Breast:Examined in sitting and supine position were symmetrical in appearance, no palpable masses or tenderness,  no skin retraction, no nipple inversion, no nipple discharge, no  skin discoloration, no axillary or supraclavicular lymphadenopathy Abdomen: no palpable masses or tenderness, no rebound or guarding Extremities: no edema or skin discoloration or tenderness  Pelvic:  Bartholin, Urethra, Skene Glands: Within normal limits             Vagina: No gross lesions or discharge, Vaginal atrophy  Cervix:  Absent  Uterus absent   Adnexa  Without masses or tenderness  Anus and perineum no abnormality seen  Rectovaginal not examined since recently done by her general surgeon             Hemoccult PCP  provides     Assessment/Plan:  80 y.o. female  postmenopausal with recently diagnosed squamous cell carcinoma of the rectum in the process of receiving radiation treatment. A Pap smear was done today. I am going to send a copy of this office notes her PCP Dr. Hulan Fess for consideration of this patient to receive Prolia which is a monoclonal antibody for osteoporosis treatment. I have given the patient literature information to read as well. We discussed importance of calcium and vitamin D and regular weight bearing exercises 3-4 times a week. PCP will be drawn her blood work.    Note: This dictation was prepared with  Dragon/digital dictation along withSmart phrase technology. Any transcriptional errors that result from this process are unintentional.   Terrance Mass MD, 5:14 PM 04/02/2014

## 2014-04-02 NOTE — Patient Instructions (Signed)
Denosumab injection What is this medicine? DENOSUMAB (den oh sue mab) slows bone breakdown. Prolia is used to treat osteoporosis in women after menopause and in men. Xgeva is used to prevent bone fractures and other bone problems caused by cancer bone metastases. Xgeva is also used to treat giant cell tumor of the bone. This medicine may be used for other purposes; ask your health care provider or pharmacist if you have questions. COMMON BRAND NAME(S): Prolia, XGEVA What should I tell my health care provider before I take this medicine? They need to know if you have any of these conditions: -dental disease -eczema -infection or history of infections -kidney disease or on dialysis -low blood calcium or vitamin D -malabsorption syndrome -scheduled to have surgery or tooth extraction -taking medicine that contains denosumab -thyroid or parathyroid disease -an unusual reaction to denosumab, other medicines, foods, dyes, or preservatives -pregnant or trying to get pregnant -breast-feeding How should I use this medicine? This medicine is for injection under the skin. It is given by a health care professional in a hospital or clinic setting. If you are getting Prolia, a special MedGuide will be given to you by the pharmacist with each prescription and refill. Be sure to read this information carefully each time. For Prolia, talk to your pediatrician regarding the use of this medicine in children. Special care may be needed. For Xgeva, talk to your pediatrician regarding the use of this medicine in children. While this drug may be prescribed for children as young as 13 years for selected conditions, precautions do apply. Overdosage: If you think you've taken too much of this medicine contact a poison control center or emergency room at once. Overdosage: If you think you have taken too much of this medicine contact a poison control center or emergency room at once. NOTE: This medicine is only for  you. Do not share this medicine with others. What if I miss a dose? It is important not to miss your dose. Call your doctor or health care professional if you are unable to keep an appointment. What may interact with this medicine? Do not take this medicine with any of the following medications: -other medicines containing denosumab This medicine may also interact with the following medications: -medicines that suppress the immune system -medicines that treat cancer -steroid medicines like prednisone or cortisone This list may not describe all possible interactions. Give your health care provider a list of all the medicines, herbs, non-prescription drugs, or dietary supplements you use. Also tell them if you smoke, drink alcohol, or use illegal drugs. Some items may interact with your medicine. What should I watch for while using this medicine? Visit your doctor or health care professional for regular checks on your progress. Your doctor or health care professional may order blood tests and other tests to see how you are doing. Call your doctor or health care professional if you get a cold or other infection while receiving this medicine. Do not treat yourself. This medicine may decrease your body's ability to fight infection. You should make sure you get enough calcium and vitamin D while you are taking this medicine, unless your doctor tells you not to. Discuss the foods you eat and the vitamins you take with your health care professional. See your dentist regularly. Brush and floss your teeth as directed. Before you have any dental work done, tell your dentist you are receiving this medicine. Do not become pregnant while taking this medicine or for 5 months after stopping   it. Women should inform their doctor if they wish to become pregnant or think they might be pregnant. There is a potential for serious side effects to an unborn child. Talk to your health care professional or pharmacist for more  information. What side effects may I notice from receiving this medicine? Side effects that you should report to your doctor or health care professional as soon as possible: -allergic reactions like skin rash, itching or hives, swelling of the face, lips, or tongue -breathing problems -chest pain -fast, irregular heartbeat -feeling faint or lightheaded, falls -fever, chills, or any other sign of infection -muscle spasms, tightening, or twitches -numbness or tingling -skin blisters or bumps, or is dry, peels, or red -slow healing or unexplained pain in the mouth or jaw -unusual bleeding or bruising Side effects that usually do not require medical attention (Report these to your doctor or health care professional if they continue or are bothersome.): -muscle pain -stomach upset, gas This list may not describe all possible side effects. Call your doctor for medical advice about side effects. You may report side effects to FDA at 1-800-FDA-1088. Where should I keep my medicine? This medicine is only given in a clinic, doctor's office, or other health care setting and will not be stored at home. NOTE: This sheet is a summary. It may not cover all possible information. If you have questions about this medicine, talk to your doctor, pharmacist, or health care provider.  2014, Elsevier/Gold Standard. (2012-04-08 12:37:47) Osteoporosis Throughout your life, your body breaks down old bone and replaces it with new bone. As you get older, your body does not replace bone as quickly as it breaks it down. By the age of 58 years, most people begin to gradually lose bone because of the imbalance between bone loss and replacement. Some people lose more bone than others. Bone loss beyond a specified normal degree is considered osteoporosis.  Osteoporosis affects the strength and durability of your bones. The inside of the ends of your bones and your flat bones, like the bones of your pelvis, look like  honeycomb, filled with tiny open spaces. As bone loss occurs, your bones become less dense. This means that the open spaces inside your bones become bigger and the walls between these spaces become thinner. This makes your bones weaker. Bones of a person with osteoporosis can become so weak that they can break (fracture) during minor accidents, such as a simple fall. CAUSES  The following factors have been associated with the development of osteoporosis:  Smoking.  Drinking more than 2 alcoholic drinks several days per week.  Long-term use of certain medicines:  Corticosteroids.  Chemotherapy medicines.  Thyroid medicines.  Antiepileptic medicines.  Gonadal hormone suppression medicine.  Immunosuppression medicine.  Being underweight.  Lack of physical activity.  Lack of exposure to the sun. This can lead to vitamin D deficiency.  Certain medical conditions:  Certain inflammatory bowel diseases, such as Crohn disease and ulcerative colitis.  Diabetes.  Hyperthyroidism.  Hyperparathyroidism. RISK FACTORS Anyone can develop osteoporosis. However, the following factors can increase your risk of developing osteoporosis:  Gender Women are at higher risk than men.  Age Being older than 64 years increases your risk.  Ethnicity White and Asian people have an increased risk.  Weight Being extremely underweight can increase your risk of osteoporosis.  Family history of osteoporosis Having a family member who has developed osteoporosis can increase your risk. SYMPTOMS  Usually, people with osteoporosis have no symptoms.  DIAGNOSIS  Signs during a physical exam that may prompt your caregiver to suspect osteoporosis include:  Decreased height. This is usually caused by the compression of the bones that form your spine (vertebrae) because they have weakened and become fractured.  A curving or rounding of the upper back (kyphosis). To confirm signs of osteoporosis, your  caregiver may request a procedure that uses 2 low-dose X-ray beams with different levels of energy to measure your bone mineral density (dual-energy X-ray absorptiometry [DXA]). Also, your caregiver may check your level of vitamin D. TREATMENT  The goal of osteoporosis treatment is to strengthen bones in order to decrease the risk of bone fractures. There are different types of medicines available to help achieve this goal. Some of these medicines work by slowing the processes of bone loss. Some medicines work by increasing bone density. Treatment also involves making sure that your levels of calcium and vitamin D are adequate. PREVENTION  There are things you can do to help prevent osteoporosis. Adequate intake of calcium and vitamin D can help you achieve optimal bone mineral density. Regular exercise can also help, especially resistance and weight-bearing activities. If you smoke, quitting smoking is an important part of osteoporosis prevention. MAKE SURE YOU:  Understand these instructions.  Will watch your condition.  Will get help right away if you are not doing well or get worse. FOR MORE INFORMATION www.osteo.org and EquipmentWeekly.com.ee Document Released: 07/19/2005 Document Revised: 02/03/2013 Document Reviewed: 09/23/2011 [facility] Patient Information 2014 [place], [place].

## 2014-04-03 ENCOUNTER — Other Ambulatory Visit: Payer: Self-pay | Admitting: Radiology

## 2014-04-06 ENCOUNTER — Encounter (HOSPITAL_COMMUNITY): Payer: Self-pay

## 2014-04-06 ENCOUNTER — Other Ambulatory Visit: Payer: Self-pay | Admitting: Radiation Oncology

## 2014-04-06 ENCOUNTER — Ambulatory Visit (HOSPITAL_COMMUNITY)
Admission: RE | Admit: 2014-04-06 | Discharge: 2014-04-06 | Disposition: A | Discharge status: Home / Self Care | Payer: Medicare Other | Source: Ambulatory Visit | Attending: Radiation Oncology | Admitting: Radiation Oncology

## 2014-04-06 DIAGNOSIS — Z510 Encounter for antineoplastic radiation therapy: Secondary | ICD-10-CM | POA: Diagnosis not present

## 2014-04-06 DIAGNOSIS — R599 Enlarged lymph nodes, unspecified: Secondary | ICD-10-CM | POA: Insufficient documentation

## 2014-04-06 DIAGNOSIS — C44520 Squamous cell carcinoma of anal skin: Secondary | ICD-10-CM

## 2014-04-06 DIAGNOSIS — C210 Malignant neoplasm of anus, unspecified: Secondary | ICD-10-CM | POA: Insufficient documentation

## 2014-04-06 LAB — CBC
HCT: 47.8 % — ABNORMAL HIGH (ref 36.0–46.0)
Hemoglobin: 16 g/dL — ABNORMAL HIGH (ref 12.0–15.0)
MCH: 32.1 pg (ref 26.0–34.0)
MCHC: 33.5 g/dL (ref 30.0–36.0)
MCV: 95.8 fL (ref 78.0–100.0)
Platelets: 182 10*3/uL (ref 150–400)
RBC: 4.99 MIL/uL (ref 3.87–5.11)
RDW: 14 % (ref 11.5–15.5)
WBC: 9.1 10*3/uL (ref 4.0–10.5)

## 2014-04-06 LAB — CYTOLOGY - PAP

## 2014-04-06 LAB — PROTIME-INR
INR: 0.93 (ref 0.00–1.49)
Prothrombin Time: 12.3 seconds (ref 11.6–15.2)

## 2014-04-06 LAB — APTT: aPTT: 30 seconds (ref 24–37)

## 2014-04-06 MED ORDER — FENTANYL CITRATE 0.05 MG/ML IJ SOLN
INTRAMUSCULAR | Status: AC
  Filled 2014-04-06: qty 4

## 2014-04-06 MED ORDER — SODIUM CHLORIDE 0.9 % IV SOLN: INTRAVENOUS | Status: DC

## 2014-04-06 MED ORDER — MIDAZOLAM HCL 2 MG/2ML IJ SOLN
INTRAMUSCULAR | Status: AC
  Filled 2014-04-06: qty 4

## 2014-04-06 NOTE — H&P (Signed)
[name] is an 80 y.o. female.   Chief Complaint: Pt was noticing blood on tissue paper after BM and was able to palpate small lesion at perianal area--noted approx 2 months ago Perianal mass was excised and determined squamous cell carcinoma Noted also enlarged Rt inguinal lymph node Pt is to begin radiation treatment for rectal cancer Biopsy of inguinal node is to determine scope/area of radiation treatment  HPI: Smoker; HTN; arthritis; COPD; HLD; Irreg heart rate  Past Medical History  Diagnosis Date  . Hypertension   . Pneumonia     hx of   . Arthritis     knees, hands   . Depression   . COPD (chronic obstructive pulmonary disease)   . Hyperlipidemia   . Osteoporosis   . Elbow fracture, right 2013  . Wears glasses   . Anxiety   . Cancer     SQUAMOS CELL CARCINOMA OF SKIN- PERIANAL     Past Surgical History  Procedure Laterality Date  . Cholecystectomy    . Joint replacement      right and left hip replacement   . Other surgical history      arthroscopic right knee   . Eye surgery      left eye cataract surgery   . Other surgical history      right carpal tunnel  . Other surgical history      right elbow surgery   . Hernia repair      left inguinal hernia repair   . Other surgical history      oophorectomy 1958  . Total knee arthroplasty  12/13/2011    Procedure: TOTAL KNEE ARTHROPLASTY;  Surgeon: Tobi Bastos, MD;  Location: [facility];  Service: Orthopedics;  Laterality: Right;  . I&d extremity Left 03/05/2013    Procedure: Irrigation and Debridement Olecranon Bursa/Left Elbow;  Surgeon: Linna Hoff, MD;  Location: [facility];  Service: Orthopedics;  Laterality: Left;  . Elbow fracture surgery  2013    rt  . Hemorrhoid surgery N/A 02/06/2014    Procedure: EXCISION PERIANAL MASS;  Surgeon: Zenovia Jarred, MD;  Location: [facility];  Service: General;  Laterality: N/A;  . Appendectomy    . Abdominal hysterectomy      TOTAL VAGINAL  HYSTERECTOMY    Family History  Problem Relation Age of Onset  . Hypertension Mother   . Cancer Father     lung  . Arthritis Father   . Cancer Sister 14    colon   . Cancer Child 63    thyroid   Social History:  reports that she has been smoking Cigarettes.  She has a 20 pack-year smoking history. She has never used smokeless tobacco. She reports that she does not drink alcohol or use illicit drugs.  Allergies: No Known Allergies   (Not in a hospital admission)  Results for orders placed during the hospital encounter of 04/06/14 (from the past 48 hour(s))  CBC     Status: Abnormal   Collection Time    04/06/14 12:55 PM      Result Value Ref Range   WBC 9.1  4.0 - 10.5 K/uL   RBC 4.99  3.87 - 5.11 MIL/uL   Hemoglobin 16.0 (*) 12.0 - 15.0 g/dL   HCT 47.8 (*) 36.0 - 46.0 %   MCV 95.8  78.0 - 100.0 fL   MCH 32.1  26.0 - 34.0 pg   MCHC 33.5  30.0 - 36.0 g/dL  RDW 14.0  11.5 - 15.5 %   Platelets 182  150 - 400 K/uL   No results found.  Review of Systems  Constitutional: Negative for fever and weight loss.  Respiratory: Negative for shortness of breath.   Cardiovascular: Negative for chest pain.  Gastrointestinal: Positive for blood in stool. Negative for nausea, vomiting and abdominal pain.  Neurological: Positive for weakness.  Psychiatric/Behavioral: Positive for substance abuse.       Smoker    Blood pressure 118/51, pulse 67, temperature 97.8 F (36.6 C), temperature source Oral, resp. rate 20, height 4\' 11"  (1.499 m), weight 63.504 kg (140 lb), SpO2 96.00%. Physical Exam  Constitutional: She is oriented to person, place, and time.  Cardiovascular: Normal rate.   No murmur heard. Irreg  Respiratory: Effort normal and breath sounds normal. She has no wheezes.  GI: Soft. Bowel sounds are normal. There is no tenderness.  Musculoskeletal: Normal range of motion.  Neurological: She is alert and oriented to person, place, and time.  Skin: Skin is warm and dry.   Psychiatric: She has a normal mood and affect. Her behavior is normal. Judgment and thought content normal.     Assessment/Plan Newly diagnosed peri anal lesion: +Sq cell carcinoma For radiation treatment to start soon Noted enlarged Rt inguinal LN Rt Inguinal lymph node bx is to determine area to include of treatment Pt is aware of procedure benefits and risks and agreeable to proceed Consent signed and in chart  Clark Clowdus A 04/06/2014, 1:32 PM

## 2014-04-06 NOTE — Sedation Documentation (Signed)
Will be wasting 100 mcg fentanyl and 2mg  versed d/t cancellation of procedure

## 2014-04-06 NOTE — Sedation Documentation (Signed)
MD cxl procedure, nothing to biopsy

## 2014-04-06 NOTE — Procedures (Signed)
Real time US performed demonstrated that the potentially enlarged R inguinal LN was actually mild focal ectasia of the proximal aspect of the right GSV.   As such, no Bx performed. D/W Dr. Lanell Persons.

## 2014-04-07 ENCOUNTER — Ambulatory Visit: Admission: RE | Admit: 2014-04-07 | Payer: Medicare Other | Source: Ambulatory Visit | Admitting: Radiation Oncology

## 2014-04-07 DIAGNOSIS — Z510 Encounter for antineoplastic radiation therapy: Secondary | ICD-10-CM | POA: Diagnosis not present

## 2014-04-08 ENCOUNTER — Ambulatory Visit: Payer: Medicare Other

## 2014-04-08 ENCOUNTER — Ambulatory Visit
Admission: RE | Admit: 2014-04-08 | Discharge: 2014-04-08 | Disposition: A | Discharge status: Home / Self Care | Payer: Medicare Other | Source: Ambulatory Visit | Attending: Radiation Oncology | Admitting: Radiation Oncology

## 2014-04-08 ENCOUNTER — Encounter: Payer: Self-pay | Admitting: Radiation Oncology

## 2014-04-08 DIAGNOSIS — Z510 Encounter for antineoplastic radiation therapy: Secondary | ICD-10-CM | POA: Diagnosis not present

## 2014-04-08 NOTE — Progress Notes (Signed)
IMRT Device Note Outpatient      4.1 delivered field widths represent one set of IMRT treatment devices. The code is [phone].  -----------------------------------  Eppie Gibson, MD

## 2014-04-09 ENCOUNTER — Ambulatory Visit: Payer: Medicare Other

## 2014-04-10 ENCOUNTER — Ambulatory Visit
Admission: RE | Admit: 2014-04-10 | Discharge: 2014-04-10 | Disposition: A | Discharge status: Home / Self Care | Payer: Medicare Other | Source: Ambulatory Visit | Attending: Radiation Oncology | Admitting: Radiation Oncology

## 2014-04-10 ENCOUNTER — Ambulatory Visit (HOSPITAL_COMMUNITY)
Admission: RE | Admit: 2014-04-10 | Discharge: 2014-04-10 | Disposition: A | Discharge status: Home / Self Care | Payer: Medicare Other | Source: Ambulatory Visit | Attending: Radiation Oncology | Admitting: Radiation Oncology

## 2014-04-10 ENCOUNTER — Ambulatory Visit: Payer: Medicare Other

## 2014-04-10 ENCOUNTER — Encounter (HOSPITAL_COMMUNITY): Payer: Self-pay

## 2014-04-10 DIAGNOSIS — J438 Other emphysema: Secondary | ICD-10-CM | POA: Insufficient documentation

## 2014-04-10 DIAGNOSIS — C210 Malignant neoplasm of anus, unspecified: Secondary | ICD-10-CM | POA: Insufficient documentation

## 2014-04-10 DIAGNOSIS — R599 Enlarged lymph nodes, unspecified: Secondary | ICD-10-CM | POA: Insufficient documentation

## 2014-04-10 DIAGNOSIS — I2510 Atherosclerotic heart disease of native coronary artery without angina pectoris: Secondary | ICD-10-CM | POA: Insufficient documentation

## 2014-04-10 DIAGNOSIS — Z9089 Acquired absence of other organs: Secondary | ICD-10-CM | POA: Insufficient documentation

## 2014-04-10 DIAGNOSIS — I700 Atherosclerosis of aorta: Secondary | ICD-10-CM | POA: Insufficient documentation

## 2014-04-10 DIAGNOSIS — Z923 Personal history of irradiation: Secondary | ICD-10-CM | POA: Insufficient documentation

## 2014-04-10 DIAGNOSIS — K449 Diaphragmatic hernia without obstruction or gangrene: Secondary | ICD-10-CM | POA: Insufficient documentation

## 2014-04-10 DIAGNOSIS — Z510 Encounter for antineoplastic radiation therapy: Secondary | ICD-10-CM | POA: Diagnosis not present

## 2014-04-10 MED ORDER — IOHEXOL 300 MG/ML  SOLN
80.0000 mL | Freq: Once | INTRAMUSCULAR | Status: AC | PRN
  Administered 2014-04-10: 80 mL via INTRAVENOUS

## 2014-04-13 ENCOUNTER — Ambulatory Visit
Admission: RE | Admit: 2014-04-13 | Discharge: 2014-04-13 | Disposition: A | Discharge status: Home / Self Care | Payer: Medicare Other | Source: Ambulatory Visit | Attending: Radiation Oncology | Admitting: Radiation Oncology

## 2014-04-13 ENCOUNTER — Telehealth: Payer: Self-pay | Admitting: *Deleted

## 2014-04-13 ENCOUNTER — Ambulatory Visit: Payer: Medicare Other

## 2014-04-13 ENCOUNTER — Encounter: Payer: Self-pay | Admitting: Radiation Oncology

## 2014-04-13 VITALS — BP 110/67 | HR 53 | Temp 97.8°F | Ht 59.0 in | Wt 140.3 lb

## 2014-04-13 DIAGNOSIS — Z510 Encounter for antineoplastic radiation therapy: Secondary | ICD-10-CM | POA: Diagnosis not present

## 2014-04-13 DIAGNOSIS — C44520 Squamous cell carcinoma of anal skin: Secondary | ICD-10-CM

## 2014-04-13 NOTE — Progress Notes (Signed)
   Weekly Management Note:  outpatient Current Dose:  5.4 Gy  Projected Dose: 45 Gy    ICD-9-CM  1. Squamous cell carcinoma of anal margin 173.52     Narrative:  The patient presents for routine under treatment assessment.  CBCT/MVCT images/Port film x-rays were reviewed.  The chart was checked. Doing well, no complaints other than a growing papule on left upper arm  Physical Findings:  height is 4\' 11"  (1.499 m) and weight is 140 lb 4.8 oz (63.64 kg). Her temperature is 97.8 F (36.6 C). Her blood pressure is 110/67 and her pulse is 53.  pearly papule on left upper arm. Skin tag vs ? Basal Cell Carcioma  Impression:  The patient is tolerating radiotherapy.  Plan:  Continue radiotherapy as planned. Will refer to dermatology for skin lesion.  She should receive yearly skin exams due to history of SCC of skin. ________________________________   Eppie Gibson, M.D.

## 2014-04-13 NOTE — Telephone Encounter (Signed)
Called patient to inform of appt. With Dr. Fontaine No on 04-15-14- arrival time - 3:30 pm, spoke with patient and she is aware of this appt.

## 2014-04-13 NOTE — Progress Notes (Signed)
[name] has received 3 fractions to her perianal region.  She denies any pain nor fatigue at this time.  Education today regarding potential side effects related to treatment of the low pelvic region inclusive of skin irritation in the anal region, fatigue, and pain. Advised to not apply any creams, lotions, ointments, nor powders in the pelvic area, especially in the perirectal region and she stated understanding. Given the Radiation Therapy and You booklet with the appropriate pages marked.  Teach Back method.  Accompanied by her family member.  Given this RN's business card.

## 2014-04-14 ENCOUNTER — Ambulatory Visit
Admission: RE | Admit: 2014-04-14 | Discharge: 2014-04-14 | Disposition: A | Discharge status: Home / Self Care | Payer: Medicare Other | Source: Ambulatory Visit | Attending: Radiation Oncology | Admitting: Radiation Oncology

## 2014-04-14 ENCOUNTER — Ambulatory Visit: Payer: Medicare Other

## 2014-04-14 DIAGNOSIS — Z510 Encounter for antineoplastic radiation therapy: Secondary | ICD-10-CM | POA: Diagnosis not present

## 2014-04-14 MED FILL — Sodium Chloride IV Soln 0.9%: INTRAVENOUS | Qty: 1000 | Status: AC

## 2014-04-15 ENCOUNTER — Encounter (INDEPENDENT_AMBULATORY_CARE_PROVIDER_SITE_OTHER): Payer: Medicare Other | Admitting: General Surgery

## 2014-04-15 ENCOUNTER — Other Ambulatory Visit: Payer: Self-pay

## 2014-04-15 ENCOUNTER — Ambulatory Visit
Admission: RE | Admit: 2014-04-15 | Discharge: 2014-04-15 | Disposition: A | Discharge status: Home / Self Care | Payer: Medicare Other | Source: Ambulatory Visit | Attending: Radiation Oncology | Admitting: Radiation Oncology

## 2014-04-15 DIAGNOSIS — Z510 Encounter for antineoplastic radiation therapy: Secondary | ICD-10-CM | POA: Diagnosis not present

## 2014-04-16 ENCOUNTER — Ambulatory Visit
Admission: RE | Admit: 2014-04-16 | Discharge: 2014-04-16 | Disposition: A | Discharge status: Home / Self Care | Payer: Medicare Other | Source: Ambulatory Visit | Attending: Radiation Oncology | Admitting: Radiation Oncology

## 2014-04-16 DIAGNOSIS — Z510 Encounter for antineoplastic radiation therapy: Secondary | ICD-10-CM | POA: Diagnosis not present

## 2014-04-17 ENCOUNTER — Ambulatory Visit
Admission: RE | Admit: 2014-04-17 | Discharge: 2014-04-17 | Disposition: A | Discharge status: Home / Self Care | Payer: Medicare Other | Source: Ambulatory Visit | Attending: Radiation Oncology | Admitting: Radiation Oncology

## 2014-04-17 DIAGNOSIS — Z510 Encounter for antineoplastic radiation therapy: Secondary | ICD-10-CM | POA: Diagnosis not present

## 2014-04-20 ENCOUNTER — Ambulatory Visit
Admission: RE | Admit: 2014-04-20 | Discharge: 2014-04-20 | Disposition: A | Discharge status: Home / Self Care | Payer: Medicare Other | Source: Ambulatory Visit | Attending: Radiation Oncology | Admitting: Radiation Oncology

## 2014-04-20 DIAGNOSIS — C44520 Squamous cell carcinoma of anal skin: Secondary | ICD-10-CM

## 2014-04-20 DIAGNOSIS — Z510 Encounter for antineoplastic radiation therapy: Secondary | ICD-10-CM | POA: Diagnosis not present

## 2014-04-20 NOTE — Progress Notes (Signed)
   Weekly Management Note:  outpatient Current Dose:  14.4 Gy  Projected Dose: 45 Gy     ICD-9-CM  1. Squamous cell carcinoma of anal margin 173.52    Narrative:  The patient presents for routine under treatment assessment.  CBCT/MVCT images/Port film x-rays were reviewed.  The chart was checked. No new complaints   Physical Findings:  There were no vitals filed for this visit. NAD  Impression:  The patient is tolerating radiotherapy.  Plan:  Continue radiotherapy as planned.   ________________________________   Eppie Gibson, M.D.

## 2014-04-20 NOTE — Progress Notes (Signed)
[name] has received 8 fractions to her pelvis/perianal region.  She denies any pain, nor diarrhea.  States fatigue, but his is from her "doing a lot over this weekend'.

## 2014-04-21 ENCOUNTER — Ambulatory Visit
Admission: RE | Admit: 2014-04-21 | Discharge: 2014-04-21 | Disposition: A | Discharge status: Home / Self Care | Payer: Medicare Other | Source: Ambulatory Visit | Attending: Radiation Oncology | Admitting: Radiation Oncology

## 2014-04-21 DIAGNOSIS — Z510 Encounter for antineoplastic radiation therapy: Secondary | ICD-10-CM | POA: Diagnosis not present

## 2014-04-22 ENCOUNTER — Ambulatory Visit
Admission: RE | Admit: 2014-04-22 | Discharge: 2014-04-22 | Disposition: A | Discharge status: Home / Self Care | Payer: Medicare Other | Source: Ambulatory Visit | Attending: Radiation Oncology | Admitting: Radiation Oncology

## 2014-04-22 DIAGNOSIS — Z510 Encounter for antineoplastic radiation therapy: Secondary | ICD-10-CM | POA: Diagnosis not present

## 2014-04-23 ENCOUNTER — Ambulatory Visit: Payer: Medicare Other

## 2014-04-23 ENCOUNTER — Ambulatory Visit
Admission: RE | Admit: 2014-04-23 | Discharge: 2014-04-23 | Disposition: A | Discharge status: Home / Self Care | Payer: Medicare Other | Source: Ambulatory Visit | Attending: Radiation Oncology | Admitting: Radiation Oncology

## 2014-04-23 DIAGNOSIS — Z510 Encounter for antineoplastic radiation therapy: Secondary | ICD-10-CM | POA: Diagnosis not present

## 2014-04-27 ENCOUNTER — Encounter: Payer: Self-pay | Admitting: Radiation Oncology

## 2014-04-27 ENCOUNTER — Ambulatory Visit
Admission: RE | Admit: 2014-04-27 | Discharge: 2014-04-27 | Disposition: A | Discharge status: Home / Self Care | Payer: Medicare Other | Source: Ambulatory Visit | Attending: Radiation Oncology | Admitting: Radiation Oncology

## 2014-04-27 VITALS — BP 121/70 | HR 62 | Temp 97.7°F | Ht 59.0 in | Wt 142.2 lb

## 2014-04-27 DIAGNOSIS — C44520 Squamous cell carcinoma of anal skin: Secondary | ICD-10-CM

## 2014-04-27 DIAGNOSIS — Z510 Encounter for antineoplastic radiation therapy: Secondary | ICD-10-CM | POA: Diagnosis not present

## 2014-04-27 MED ORDER — SILVER SULFADIAZINE 1 % EX CREA
TOPICAL_CREAM | Freq: Two times a day (BID) | CUTANEOUS | Status: DC
  Administered 2014-04-27: 11:00:00 via TOPICAL

## 2014-04-27 NOTE — Progress Notes (Signed)
Prior to disposition to home.  Silvadene applied to the perianal region which demonstrated moist desquamation.  [name] given Silvadene to apply BID at home with instructions to cleanse off completely between applications and she stated understanding.

## 2014-04-27 NOTE — Progress Notes (Signed)
Ms. Delapaz has received 12 fractions to her Pelvis/Peranal region.  She reports tenderness in this area and grades this a a level 6-7/10, but is not taking any pain medication.  Sitz baths only at this time  She reports a spot of blood on tissue when wiping on yesterday. She reports constipation, so advised her to take her Colace daily to maintain soft stools to reduce irritation of the rectal region. She stated agreement.  She reports a "rotten" smell from her nose whic she noticed for the first time this morning. Nasal secretions are clear.

## 2014-04-27 NOTE — Progress Notes (Signed)
   Weekly Management Note:  outpatient Current Dose:  21.6 Gy  Projected Dose: 45 Gy  Perianal skin   ICD-9-CM  1. Squamous cell carcinoma of anal margin 173.52    Narrative:  The patient presents for routine under treatment assessment.  CBCT/MVCT images/Port film x-rays were reviewed.  The chart was checked. She reports tenderness in this area and grades this a a level 6-7/10, but is not desiring any pain medication. Sitz baths only at this time She reports a spot of blood on tissue when wiping on yesterday. She reports constipation, so nursing advised her to take her Colace to maintain soft stools to reduce irritation of the rectal region.  I also advised Metamucil PRN as well.She stated agreement.   Physical Findings:  height is 4\' 11"  (1.499 m) and weight is 142 lb 3.2 oz (64.501 kg). Her temperature is 97.7 F (36.5 C). Her blood pressure is 121/70 and her pulse is 62.  moist desquamation of anal region/ perineum  Impression:  The patient is tolerating radiotherapy.  Plan:  Continue radiotherapy as planned. Continue Sitz baths TID to 5x daily, fanning cool air PRN, and start silvadene on irritated area. Pain meds declined today. Colace to maintain soft stools to reduce irritation of the rectal region.  I also advised Metamucil PRN ________________________________   Eppie Gibson, M.D.

## 2014-04-28 ENCOUNTER — Ambulatory Visit
Admission: RE | Admit: 2014-04-28 | Discharge: 2014-04-28 | Disposition: A | Discharge status: Home / Self Care | Payer: Medicare Other | Source: Ambulatory Visit | Attending: Radiation Oncology | Admitting: Radiation Oncology

## 2014-04-28 DIAGNOSIS — Z510 Encounter for antineoplastic radiation therapy: Secondary | ICD-10-CM | POA: Diagnosis not present

## 2014-04-29 ENCOUNTER — Ambulatory Visit
Admission: RE | Admit: 2014-04-29 | Discharge: 2014-04-29 | Disposition: A | Discharge status: Home / Self Care | Payer: Medicare Other | Source: Ambulatory Visit | Attending: Radiation Oncology | Admitting: Radiation Oncology

## 2014-04-29 DIAGNOSIS — Z510 Encounter for antineoplastic radiation therapy: Secondary | ICD-10-CM | POA: Diagnosis not present

## 2014-04-30 ENCOUNTER — Ambulatory Visit
Admission: RE | Admit: 2014-04-30 | Discharge: 2014-04-30 | Disposition: A | Discharge status: Home / Self Care | Payer: Medicare Other | Source: Ambulatory Visit | Attending: Radiation Oncology | Admitting: Radiation Oncology

## 2014-04-30 DIAGNOSIS — Z510 Encounter for antineoplastic radiation therapy: Secondary | ICD-10-CM | POA: Diagnosis not present

## 2014-05-01 ENCOUNTER — Ambulatory Visit
Admission: RE | Admit: 2014-05-01 | Discharge: 2014-05-01 | Disposition: A | Discharge status: Home / Self Care | Payer: Medicare Other | Source: Ambulatory Visit | Attending: Radiation Oncology | Admitting: Radiation Oncology

## 2014-05-01 DIAGNOSIS — Z510 Encounter for antineoplastic radiation therapy: Secondary | ICD-10-CM | POA: Diagnosis not present

## 2014-05-04 ENCOUNTER — Ambulatory Visit
Admission: RE | Admit: 2014-05-04 | Discharge: 2014-05-04 | Disposition: A | Discharge status: Home / Self Care | Payer: Medicare Other | Source: Ambulatory Visit | Attending: Radiation Oncology | Admitting: Radiation Oncology

## 2014-05-04 ENCOUNTER — Encounter: Payer: Self-pay | Admitting: Radiation Oncology

## 2014-05-04 VITALS — BP 122/53 | HR 63 | Temp 97.9°F | Ht 59.0 in | Wt 142.0 lb

## 2014-05-04 DIAGNOSIS — Z510 Encounter for antineoplastic radiation therapy: Secondary | ICD-10-CM | POA: Diagnosis not present

## 2014-05-04 DIAGNOSIS — C44520 Squamous cell carcinoma of anal skin: Secondary | ICD-10-CM

## 2014-05-04 NOTE — Progress Notes (Signed)
   Weekly Management Note:  outpatient Current Dose:  30.6 Gy  Projected Dose: 45 Gy    ICD-9-CM  1. Squamous cell carcinoma of anal margin 173.52    Narrative:  The patient presents for routine under treatment assessment.  CBCT/MVCT images/Port film x-rays were reviewed.  The chart was checked. Silvadene helps pain, "dries up" irritation.  Physical Findings:  height is 4\' 11"  (1.499 m) and weight is 142 lb (64.411 kg). Her temperature is 97.9 F (36.6 C). Her blood pressure is 122/53 and her pulse is 63.  NAD. Perianal skin with stable moist desquamation. No blood.  Impression:  The patient is tolerating radiotherapy.  Plan:  Continue radiotherapy as planned. Sitz bathes at least BID, and silvadene BID.  ________________________________   Eppie Gibson, M.D.

## 2014-05-04 NOTE — Progress Notes (Signed)
[name] reports that she has obtained relief in the perianal region since starting Silvadene cream, bid, as ordered by Dr. Isidore Moos on 03/28/14.  Denies any rectal bleeding and is using baby wipes to decrease friction. Denies any urinary issues at this time.  Admits to having fatigue and is resting during the day.

## 2014-05-05 ENCOUNTER — Ambulatory Visit
Admission: RE | Admit: 2014-05-05 | Discharge: 2014-05-05 | Disposition: A | Discharge status: Home / Self Care | Payer: Medicare Other | Source: Ambulatory Visit | Attending: Radiation Oncology | Admitting: Radiation Oncology

## 2014-05-05 DIAGNOSIS — Z510 Encounter for antineoplastic radiation therapy: Secondary | ICD-10-CM | POA: Diagnosis not present

## 2014-05-06 ENCOUNTER — Ambulatory Visit
Admission: RE | Admit: 2014-05-06 | Discharge: 2014-05-06 | Disposition: A | Discharge status: Home / Self Care | Payer: Medicare Other | Source: Ambulatory Visit | Attending: Radiation Oncology | Admitting: Radiation Oncology

## 2014-05-06 DIAGNOSIS — Z510 Encounter for antineoplastic radiation therapy: Secondary | ICD-10-CM | POA: Diagnosis not present

## 2014-05-07 ENCOUNTER — Ambulatory Visit
Admission: RE | Admit: 2014-05-07 | Discharge: 2014-05-07 | Disposition: A | Discharge status: Home / Self Care | Payer: Medicare Other | Source: Ambulatory Visit | Attending: Radiation Oncology | Admitting: Radiation Oncology

## 2014-05-07 DIAGNOSIS — Z510 Encounter for antineoplastic radiation therapy: Secondary | ICD-10-CM | POA: Diagnosis not present

## 2014-05-08 ENCOUNTER — Ambulatory Visit
Admission: RE | Admit: 2014-05-08 | Discharge: 2014-05-08 | Disposition: A | Discharge status: Home / Self Care | Payer: Medicare Other | Source: Ambulatory Visit | Attending: Radiation Oncology | Admitting: Radiation Oncology

## 2014-05-08 DIAGNOSIS — Z510 Encounter for antineoplastic radiation therapy: Secondary | ICD-10-CM | POA: Diagnosis not present

## 2014-05-11 ENCOUNTER — Encounter: Payer: Self-pay | Admitting: Radiation Oncology

## 2014-05-11 ENCOUNTER — Ambulatory Visit
Admission: RE | Admit: 2014-05-11 | Discharge: 2014-05-11 | Disposition: A | Discharge status: Home / Self Care | Payer: Medicare Other | Source: Ambulatory Visit | Attending: Radiation Oncology | Admitting: Radiation Oncology

## 2014-05-11 VITALS — BP 139/80 | HR 70 | Temp 98.1°F | Resp 20 | Wt 144.6 lb

## 2014-05-11 DIAGNOSIS — Z510 Encounter for antineoplastic radiation therapy: Secondary | ICD-10-CM | POA: Diagnosis not present

## 2014-05-11 DIAGNOSIS — K6289 Other specified diseases of anus and rectum: Secondary | ICD-10-CM

## 2014-05-11 DIAGNOSIS — C44520 Squamous cell carcinoma of anal skin: Secondary | ICD-10-CM

## 2014-05-11 MED ORDER — SILVER SULFADIAZINE 1 % EX CREA
TOPICAL_CREAM | Freq: Two times a day (BID) | CUTANEOUS | Status: DC
  Administered 2014-05-11: 11:00:00 via TOPICAL

## 2014-05-11 NOTE — Progress Notes (Signed)
   Weekly Management Note:  outpatient    ICD-9-CM  1. Squamous cell carcinoma of anal margin 173.52    Current Dose:  39.6 Gy  Projected Dose: 45 Gy   Narrative:  The patient presents for routine under treatment assessment.  CBCT/MVCT images/Port film x-rays were reviewed.  The chart was checked. Perianal pain soothed by baby wipes, using sitz bath bid and silvadene bid with some relief, is having pink tinge blood on baby wipes after each bowel movement, no dysuria, moist desquamation on bottom and perianal area, appetite good, fatigued.  Physical Findings:  weight is 144 lb 9.6 oz (65.59 kg). Her oral temperature is 98.1 F (36.7 C). Her blood pressure is 139/80 and her pulse is 70. Her respiration is 20.  moist desquamation - anal and labial bilaterally, no blood  Impression:  The patient is tolerating radiotherapy.  Plan:  Continue radiotherapy as planned. Silvadene refill given. F/u in 3 weeks  ________________________________   Eppie Gibson, M.D.

## 2014-05-11 NOTE — Progress Notes (Signed)
Weekly rad txs 22 [elvic/perianal   Completed,  C/o pain 5-6 on 1/10 scale rectal, using sitz bath bid and silvadene bid with some relief, is having pink tinge blood on baby wipes after each bowel movement,  No dysuria, moist desquamation on bottom and perianal area, appetite good, fatigued, takes rest periods during hte day 10:22 AM

## 2014-05-12 ENCOUNTER — Ambulatory Visit
Admission: RE | Admit: 2014-05-12 | Discharge: 2014-05-12 | Disposition: A | Discharge status: Home / Self Care | Payer: Medicare Other | Source: Ambulatory Visit | Attending: Radiation Oncology | Admitting: Radiation Oncology

## 2014-05-12 DIAGNOSIS — Z510 Encounter for antineoplastic radiation therapy: Secondary | ICD-10-CM | POA: Diagnosis not present

## 2014-05-13 ENCOUNTER — Ambulatory Visit
Admission: RE | Admit: 2014-05-13 | Discharge: 2014-05-13 | Disposition: A | Discharge status: Home / Self Care | Payer: Medicare Other | Source: Ambulatory Visit | Attending: Radiation Oncology | Admitting: Radiation Oncology

## 2014-05-13 ENCOUNTER — Ambulatory Visit: Payer: Medicare Other | Admitting: Radiation Oncology

## 2014-05-13 DIAGNOSIS — Z510 Encounter for antineoplastic radiation therapy: Secondary | ICD-10-CM | POA: Diagnosis not present

## 2014-05-14 ENCOUNTER — Encounter: Payer: Self-pay | Admitting: Radiation Oncology

## 2014-05-14 ENCOUNTER — Ambulatory Visit
Admission: RE | Admit: 2014-05-14 | Discharge: 2014-05-14 | Disposition: A | Discharge status: Home / Self Care | Payer: Medicare Other | Source: Ambulatory Visit

## 2014-05-14 ENCOUNTER — Ambulatory Visit: Payer: Medicare Other

## 2014-05-14 DIAGNOSIS — Z510 Encounter for antineoplastic radiation therapy: Secondary | ICD-10-CM | POA: Diagnosis not present

## 2014-05-15 ENCOUNTER — Ambulatory Visit: Payer: Medicare Other

## 2014-05-17 NOTE — Progress Notes (Signed)
  Radiation Oncology         [phone] ________________________________  Name: [name] MRN: [id number]  Date: 05/14/2014  DOB: 13-Apr-1934  End of Treatment Note  Diagnosis:   Squamous Cell Carcinoma of perianal skin - [id number], Stage II     Indication for treatment:  Curative, post-operative       Radiation treatment dates:  04/08/2014-05/14/2014  Site/dose:   Right perianal skin / 45 Gy in 25 fractions  Beams/energy:   Helical IMRT / 6MV  Narrative: The patient tolerated radiation treatment relatively well.  She developed moist desquamation.  Perianal pain was soothed by baby wipes, using sitz bath bid and silvadene bid with some relief.  Plan: The patient has completed radiation treatment. The patient will return to radiation oncology clinic for routine followup in 3wks.  I advised them to call or return sooner if they have any questions or concerns related to their recovery or treatment.  -----------------------------------  Eppie Gibson, MD

## 2014-05-18 ENCOUNTER — Ambulatory Visit: Payer: Medicare Other

## 2014-06-03 ENCOUNTER — Encounter: Payer: Self-pay | Admitting: Radiation Oncology

## 2014-06-05 ENCOUNTER — Encounter: Payer: Self-pay | Admitting: Radiation Oncology

## 2014-06-05 ENCOUNTER — Ambulatory Visit
Admission: RE | Admit: 2014-06-05 | Discharge: 2014-06-05 | Disposition: A | Discharge status: Home / Self Care | Payer: Medicare Other | Source: Ambulatory Visit | Attending: Radiation Oncology | Admitting: Radiation Oncology

## 2014-06-05 VITALS — BP 154/83 | HR 67 | Temp 98.2°F | Ht 59.0 in | Wt 143.3 lb

## 2014-06-05 DIAGNOSIS — C44520 Squamous cell carcinoma of anal skin: Secondary | ICD-10-CM

## 2014-06-05 HISTORY — DX: Personal history of irradiation: Z92.3

## 2014-06-05 NOTE — Progress Notes (Addendum)
[name] here for follow up after treatment to her right perianal skin.  She reports pain at a 9/10 on her left leg, between her knee and ankle.  She reports trouble walking.  She said it started Saturday.  She denies falling.  She reports having the pain before but not as bad.  She has been taking 1/2 of a percocet twice a day alternating with Aleve since Saturday.  She reports that the skin in the rectal area is healed.  On inspection, the skin in her rectal area is pink with healing areas of peeling skin.  She is not using silvadene.  She denies having diarrhea.  She reports having occasional constipation.  She is taking colace every other day.  She reports seeing a small amount of rectal bleeding after wiping 2 days ago.  She denies nausea and reports a good appetite.  She reports fatigue.

## 2014-06-05 NOTE — Progress Notes (Signed)
Radiation Oncology         [phone] ________________________________  Name: [name] MRN: [id number]  Date: 06/05/2014  DOB: 01/07/1934  Follow-Up Visit Note  Outpatient  CC: Gennette Pac, MD  Wyatt Portela, MD  Diagnosis and Prior Radiotherapy:   Squamous Cell Carcinoma of perianal skin - cT2N0M0, Stage II  Indication for treatment: Curative, post-operative  Radiation treatment dates: 04/08/2014-05/14/2014  Site/dose: Right perianal skin / 45 Gy in 25 fractions   Narrative:  The patient returns today for routine follow-up.  Occasional pain with BM's, sometime blood on tissue when wiping. Uses thin film of silvadene at time of BM's for comfort.  Mostly healed. Colace QOD. Pain in left shin.  Worse with weight bearing. Discussed with family doctor.  Left ankle a little larger than right, which is chronic, stable. No calf pain or new swelling.                               ALLERGIES:  has No Known Allergies.  Meds: Current Outpatient Prescriptions  Medication Sig Dispense Refill  . ALPRAZolam (XANAX) 0.25 MG tablet Take 0.25 mg by mouth 2 (two) times daily as needed for anxiety. For anxiety      . aspirin EC 81 MG tablet Take 81 mg by mouth at bedtime.      . Cholecalciferol (VITAMIN D) 2000 UNITS CAPS Take 1 capsule by mouth at bedtime.      . docusate sodium (COLACE) 100 MG capsule Take 100 mg by mouth daily as needed.      . hydrochlorothiazide (HYDRODIURIL) 25 MG tablet Take 12.5 mg by mouth daily after breakfast.      . losartan (COZAAR) 50 MG tablet Take 50 mg by mouth daily.      Marland Kitchen lovastatin (MEVACOR) 40 MG tablet Take 40 mg by mouth every evening.      . Multiple Vitamin (MULTIVITAMIN WITH MINERALS) TABS Take 1 tablet by mouth every morning.      . Multiple Vitamins-Minerals (OCUVITE PRESERVISION PO) Take by mouth 2 (two) times daily.      Marland Kitchen oxyCODONE-acetaminophen (PERCOCET/ROXICET) 5-325 MG per tablet       . venlafaxine (EFFEXOR-XR) 75 MG 24 hr capsule  Take 75 mg by mouth daily after breakfast.      . silver sulfADIAZINE (SILVADENE) 1 % cream Apply 1 application topically 2 (two) times daily. Apply to perianal region Twice daily, but wash completely between applications 2nf tube given 05/12/14       No current facility-administered medications for this encounter.    Physical Findings: The patient is in no acute distress. Patient is alert and oriented.  height is 4\' 11"  (1.499 m) and weight is 143 lb 4.8 oz (65 kg). Her oral temperature is 98.2 F (36.8 C). Her blood pressure is 154/83 and her pulse is 67. Her oxygen saturation is 97%. .   Anus/ perianal skin has healed well. Some residual dryness. Punctate area of moist desquamation. No tumor. No cords/edema/ or pain to palpation in left calf; tender in shin   Lab Findings: Lab Results  Component Value Date   WBC 9.1 04/06/2014   HGB 16.0* 04/06/2014   HCT 47.8* 04/06/2014   MCV 95.8 04/06/2014   PLT 182 04/06/2014    Radiographic Findings: No results found.  Impression/Plan:  Healing well from RT. Take Colace daily, Silvadene daily until healing complete.F/u in 63mo for physical exam. Defer  to PCP re: shin pain. Unlikely to be related to cancer. Unlikely to be DVT or metastasis.   ____________________________________   Eppie Gibson, MD

## 2014-06-12 ENCOUNTER — Telehealth: Payer: Self-pay | Admitting: Oncology

## 2014-06-12 ENCOUNTER — Other Ambulatory Visit (HOSPITAL_BASED_OUTPATIENT_CLINIC_OR_DEPARTMENT_OTHER): Payer: Medicare Other

## 2014-06-12 ENCOUNTER — Ambulatory Visit (HOSPITAL_BASED_OUTPATIENT_CLINIC_OR_DEPARTMENT_OTHER): Payer: Medicare Other | Admitting: Oncology

## 2014-06-12 VITALS — BP 134/56 | HR 67 | Temp 98.0°F | Resp 18 | Ht 59.0 in | Wt 143.2 lb

## 2014-06-12 DIAGNOSIS — C44529 Squamous cell carcinoma of skin of other part of trunk: Secondary | ICD-10-CM

## 2014-06-12 DIAGNOSIS — C4492 Squamous cell carcinoma of skin, unspecified: Secondary | ICD-10-CM

## 2014-06-12 LAB — CBC WITH DIFFERENTIAL/PLATELET
BASO%: 0.4 % (ref 0.0–2.0)
BASOS ABS: 0 10*3/uL (ref 0.0–0.1)
EOS ABS: 0 10*3/uL (ref 0.0–0.5)
EOS%: 0.4 % (ref 0.0–7.0)
HCT: 46.3 % (ref 34.8–46.6)
HEMOGLOBIN: 15.1 g/dL (ref 11.6–15.9)
LYMPH%: 10.4 % — ABNORMAL LOW (ref 14.0–49.7)
MCH: 31.3 pg (ref 25.1–34.0)
MCHC: 32.7 g/dL (ref 31.5–36.0)
MCV: 95.6 fL (ref 79.5–101.0)
MONO#: 0.9 10*3/uL (ref 0.1–0.9)
MONO%: 7.9 % (ref 0.0–14.0)
NEUT%: 80.9 % — AB (ref 38.4–76.8)
NEUTROS ABS: 9.2 10*3/uL — AB (ref 1.5–6.5)
Platelets: 164 10*3/uL (ref 145–400)
RBC: 4.84 10*6/uL (ref 3.70–5.45)
RDW: 14 % (ref 11.2–14.5)
WBC: 11.4 10*3/uL — ABNORMAL HIGH (ref 3.9–10.3)
lymph#: 1.2 10*3/uL (ref 0.9–3.3)

## 2014-06-12 LAB — COMPREHENSIVE METABOLIC PANEL (CC13)
ALK PHOS: 48 U/L (ref 40–150)
ALT: 11 U/L (ref 0–55)
AST: 18 U/L (ref 5–34)
Albumin: 3.5 g/dL (ref 3.5–5.0)
Anion Gap: 9 mEq/L (ref 3–11)
BILIRUBIN TOTAL: 0.28 mg/dL (ref 0.20–1.20)
BUN: 16.4 mg/dL (ref 7.0–26.0)
CO2: 28 mEq/L (ref 22–29)
Calcium: 9.9 mg/dL (ref 8.4–10.4)
Chloride: 105 mEq/L (ref 98–109)
Creatinine: 0.7 mg/dL (ref 0.6–1.1)
Glucose: 85 mg/dl (ref 70–140)
Potassium: 3.4 mEq/L — ABNORMAL LOW (ref 3.5–5.1)
Sodium: 143 mEq/L (ref 136–145)
Total Protein: 6.7 g/dL (ref 6.4–8.3)

## 2014-06-12 NOTE — Telephone Encounter (Signed)
Pt confirmed labs/ov per 08/21 POF, gave pt AVS....KJ

## 2014-06-12 NOTE — Progress Notes (Signed)
Hematology and Oncology Follow Up Visit  [name] [id number] April 21, 1934 80 y.o. 06/12/2014 10:28 AM [name], MDLittle, [name], MD   Principle Diagnosis: 80 year old woman squamous cell carcinoma of the perianal area diagnosed in April of 2015. She presented with perianal mass measuring 1.5 cm. Clinical stage TII N0.  Prior Therapy:  On 02/06/2014 she underwent an excision of a perianal mass which was done successfully without any complications. The pathology showed squamous cell carcinoma with surgical margins appeared negative for carcinoma. The specimen described to be measuring 3.4 x 2.0 cm but the lesion measuring 2.5 x 2.1 x 0.7 cm. She is status post adjuvant radiation therapy to the pelvic and perianal area between 6/17 and 05/14/2014. She received a total of 45 gray in 25 fractions to the right perianal skin.  Current therapy: Observation and surveillance.  Interim History:  Ms. [name] presents today for a followup visit. Since her last visit, she completed radiation therapy without any major complications. She did report perianal irritation and constipation. She did not report any hematochezia or melena. She did not report any constitutional symptoms of fevers or chills or sweats. No reported weight loss or appetite changes. She has not reported any headaches or blurry vision or syncope. Did not really skin rashes or lesions. She did not report any chest pain or palpitation orthopnea. Does not report any wheezing or shortness of breath. She continues to be functional and perform activities of daily living without any hindrance to decline. Rest of her review of systems unremarkable.  Medications: I have reviewed the patient's current medications.  Current Outpatient Prescriptions  Medication Sig Dispense Refill  . predniSONE (STERAPRED UNI-PAK) 10 MG tablet Take by mouth daily.      . traMADol (ULTRAM) 50 MG tablet Take 50 mg by mouth every 6 (six) hours as needed.      .  ALPRAZolam (XANAX) 0.25 MG tablet Take 0.25 mg by mouth 2 (two) times daily as needed for anxiety. For anxiety      . aspirin EC 81 MG tablet Take 81 mg by mouth at bedtime.      . Cholecalciferol (VITAMIN D) 2000 UNITS CAPS Take 1 capsule by mouth at bedtime.      . docusate sodium (COLACE) 100 MG capsule Take 100 mg by mouth daily as needed.      . hydrochlorothiazide (HYDRODIURIL) 25 MG tablet Take 12.5 mg by mouth daily after breakfast.      . losartan (COZAAR) 50 MG tablet Take 50 mg by mouth daily.      Marland Kitchen lovastatin (MEVACOR) 40 MG tablet Take 40 mg by mouth every evening.      . Multiple Vitamin (MULTIVITAMIN WITH MINERALS) TABS Take 1 tablet by mouth every morning.      . Multiple Vitamins-Minerals (OCUVITE PRESERVISION PO) Take by mouth 2 (two) times daily.      Marland Kitchen oxyCODONE-acetaminophen (PERCOCET/ROXICET) 5-325 MG per tablet       . silver sulfADIAZINE (SILVADENE) 1 % cream Apply 1 application topically 2 (two) times daily. Apply to perianal region Twice daily, but wash completely between applications 2nf tube given 05/12/14      . venlafaxine (EFFEXOR-XR) 75 MG 24 hr capsule Take 75 mg by mouth daily after breakfast.       No current facility-administered medications for this visit.     Allergies: No Known Allergies  Past Medical History, Surgical history, Social history, and Family History were reviewed and updated.   Physical Exam: Blood  pressure 134/56, pulse 67, temperature 98 F (36.7 C), temperature source Oral, resp. rate 18, height 4\' 11"  (1.499 m), weight 143 lb 3.2 oz (64.955 kg). ECOG:  General appearance: alert and cooperative Head: Normocephalic, without obvious abnormality Neck: no adenopathy Lymph nodes: Cervical, supraclavicular, and axillary nodes normal. Heart:regular rate and rhythm, S1, S2 normal, no murmur, click, rub or gallop Lung:chest clear, no wheezing, rales, normal symmetric air entry Abdomin: soft, non-tender, without masses or  organomegaly EXT:no erythema, induration, or nodules   Lab Results: Lab Results  Component Value Date   WBC 11.4* 06/12/2014   HGB 15.1 06/12/2014   HCT 46.3 06/12/2014   MCV 95.6 06/12/2014   PLT 164 06/12/2014     Chemistry      Component Value Date/Time   NA 142 02/03/2014 1400   K 4.1 02/03/2014 1400   CL 98 02/03/2014 1400   CO2 29 02/03/2014 1400   BUN 12.1 03/23/2014 0922   BUN 15 02/03/2014 1400   CREATININE 0.8 03/23/2014 0922   CREATININE 0.69 02/03/2014 1400      Component Value Date/Time   CALCIUM 11.0* 02/03/2014 1400   ALKPHOS 62 03/05/2013 1627   AST 21 03/05/2013 1627   ALT 18 03/05/2013 1627   BILITOT 0.3 03/05/2013 1627       Impression and Plan:   80 year old woman with the following issues:  1. Squamous cell carcinoma of the perianal area. Clinical stage TII N0. She is status post surgical excision followed by adjuvant radiation therapy. She tolerated it well without any delayed complications. The plan is to continue active surveillance had repeat imaging studies in December of 2015. She'll require clinical monitoring and occasional imaging studies to detect any recurrences.  2. Age-appropriate cancer screening: She is to schedule a colonoscopy and a mammogram in the near future.   [facility], MD 8/21/201510:28 AM

## 2014-06-24 ENCOUNTER — Other Ambulatory Visit: Payer: Self-pay | Admitting: Gynecology

## 2014-06-24 DIAGNOSIS — Z1231 Encounter for screening mammogram for malignant neoplasm of breast: Secondary | ICD-10-CM

## 2014-07-03 ENCOUNTER — Ambulatory Visit (HOSPITAL_COMMUNITY)
Admission: RE | Admit: 2014-07-03 | Discharge: 2014-07-03 | Disposition: A | Discharge status: Home / Self Care | Payer: Medicare Other | Source: Ambulatory Visit | Attending: Gynecology | Admitting: Gynecology

## 2014-07-03 DIAGNOSIS — Z1231 Encounter for screening mammogram for malignant neoplasm of breast: Secondary | ICD-10-CM | POA: Diagnosis present

## 2014-08-04 ENCOUNTER — Encounter: Payer: Self-pay | Admitting: *Deleted

## 2014-08-04 NOTE — Progress Notes (Signed)
Ms. Kreiger daughter called and reported that her mother has a "new area" in the rectal region and she wanted to know who her mother needed to see "first'". She completed radiation therapy to for Squamous Cell Carcinoma of the Perianal Skin in July 2015.  Called Ms. Soderman to clarify, and she stated she has a raw, itchy area in the anal area, opposite of her original site.  She relayed that she has made an appointment, for this Friday, with her [facility] physician who initially noted her original perianal cancer.  She also reported that she has a colonoscopy scheduled for Thursday of next week.  Dr. Valere Dross stated this was an appropriate plan.

## 2014-08-13 ENCOUNTER — Other Ambulatory Visit: Payer: Self-pay | Admitting: Gastroenterology

## 2014-08-24 ENCOUNTER — Encounter: Payer: Self-pay | Admitting: Radiation Oncology

## 2014-09-22 ENCOUNTER — Encounter (HOSPITAL_COMMUNITY): Payer: Self-pay

## 2014-09-22 ENCOUNTER — Ambulatory Visit (HOSPITAL_COMMUNITY)
Admission: RE | Admit: 2014-09-22 | Discharge: 2014-09-22 | Disposition: A | Discharge status: Home / Self Care | Payer: Medicare Other | Source: Ambulatory Visit | Attending: Oncology | Admitting: Oncology

## 2014-09-22 ENCOUNTER — Other Ambulatory Visit: Payer: Medicare Other

## 2014-09-22 ENCOUNTER — Other Ambulatory Visit (HOSPITAL_BASED_OUTPATIENT_CLINIC_OR_DEPARTMENT_OTHER): Payer: Medicare Other

## 2014-09-22 DIAGNOSIS — C210 Malignant neoplasm of anus, unspecified: Secondary | ICD-10-CM | POA: Insufficient documentation

## 2014-09-22 DIAGNOSIS — R109 Unspecified abdominal pain: Secondary | ICD-10-CM | POA: Diagnosis not present

## 2014-09-22 DIAGNOSIS — G8929 Other chronic pain: Secondary | ICD-10-CM | POA: Diagnosis not present

## 2014-09-22 DIAGNOSIS — C4492 Squamous cell carcinoma of skin, unspecified: Secondary | ICD-10-CM

## 2014-09-22 DIAGNOSIS — C44529 Squamous cell carcinoma of skin of other part of trunk: Secondary | ICD-10-CM

## 2014-09-22 LAB — CBC WITH DIFFERENTIAL/PLATELET
BASO%: 0.6 % (ref 0.0–2.0)
Basophils Absolute: 0.1 10*3/uL (ref 0.0–0.1)
EOS ABS: 0.1 10*3/uL (ref 0.0–0.5)
EOS%: 1.3 % (ref 0.0–7.0)
HCT: 48.3 % — ABNORMAL HIGH (ref 34.8–46.6)
HGB: 15.6 g/dL (ref 11.6–15.9)
LYMPH%: 17.2 % (ref 14.0–49.7)
MCH: 31.6 pg (ref 25.1–34.0)
MCHC: 32.3 g/dL (ref 31.5–36.0)
MCV: 98 fL (ref 79.5–101.0)
MONO#: 0.7 10*3/uL (ref 0.1–0.9)
MONO%: 7.8 % (ref 0.0–14.0)
NEUT%: 73.1 % (ref 38.4–76.8)
NEUTROS ABS: 6.5 10*3/uL (ref 1.5–6.5)
PLATELETS: 214 10*3/uL (ref 145–400)
RBC: 4.93 10*6/uL (ref 3.70–5.45)
RDW: 14.3 % (ref 11.2–14.5)
WBC: 8.9 10*3/uL (ref 3.9–10.3)
lymph#: 1.5 10*3/uL (ref 0.9–3.3)

## 2014-09-22 LAB — COMPREHENSIVE METABOLIC PANEL (CC13)
ALBUMIN: 3.6 g/dL (ref 3.5–5.0)
ALT: 14 U/L (ref 0–55)
ANION GAP: 11 meq/L (ref 3–11)
AST: 18 U/L (ref 5–34)
Alkaline Phosphatase: 61 U/L (ref 40–150)
BUN: 11.3 mg/dL (ref 7.0–26.0)
CALCIUM: 10.8 mg/dL — AB (ref 8.4–10.4)
CHLORIDE: 100 meq/L (ref 98–109)
CO2: 32 meq/L — AB (ref 22–29)
Creatinine: 0.8 mg/dL (ref 0.6–1.1)
GLUCOSE: 84 mg/dL (ref 70–140)
Potassium: 3.7 mEq/L (ref 3.5–5.1)
SODIUM: 142 meq/L (ref 136–145)
TOTAL PROTEIN: 7 g/dL (ref 6.4–8.3)
Total Bilirubin: 0.52 mg/dL (ref 0.20–1.20)

## 2014-09-22 MED ORDER — IOHEXOL 300 MG/ML  SOLN
80.0000 mL | Freq: Once | INTRAMUSCULAR | Status: AC | PRN
  Administered 2014-09-22: 80 mL via INTRAVENOUS

## 2014-09-24 ENCOUNTER — Telehealth: Payer: Self-pay | Admitting: Oncology

## 2014-09-24 ENCOUNTER — Ambulatory Visit (HOSPITAL_BASED_OUTPATIENT_CLINIC_OR_DEPARTMENT_OTHER): Payer: Medicare Other | Admitting: Oncology

## 2014-09-24 VITALS — BP 134/72 | HR 67 | Temp 97.7°F | Resp 17 | Ht 59.0 in | Wt 140.2 lb

## 2014-09-24 DIAGNOSIS — Z8589 Personal history of malignant neoplasm of other organs and systems: Secondary | ICD-10-CM

## 2014-09-24 DIAGNOSIS — C44520 Squamous cell carcinoma of anal skin: Secondary | ICD-10-CM

## 2014-09-24 DIAGNOSIS — C4492 Squamous cell carcinoma of skin, unspecified: Secondary | ICD-10-CM

## 2014-09-24 NOTE — Telephone Encounter (Signed)
Pt confirmed labs/ov per 12/03 POF, gave pt AVS... KJ °

## 2014-09-24 NOTE — Progress Notes (Signed)
Hematology and Oncology Follow Up Visit  NANNETTE ZILL [id number] [birth year] 80 y.o. 09/24/2014 10:18 AM Gennette Pac, MDLittle, [name], MD   Principle Diagnosis: 80 year old woman squamous cell carcinoma of the perianal area diagnosed in April of 2015. She presented with perianal mass measuring 1.5 cm. Clinical stage TII N0.  Prior Therapy:  On 02/06/2014 she underwent an excision of a perianal mass which was done successfully without any complications. The pathology showed squamous cell carcinoma with surgical margins appeared negative for carcinoma. The specimen described to be measuring 3.4 x 2.0 cm but the lesion measuring 2.5 x 2.1 x 0.7 cm. She is status post adjuvant radiation therapy to the pelvic and perianal area between 6/17 and 05/14/2014. She received a total of 45 gray in 25 fractions to the right perianal skin.  Current therapy: Observation and surveillance.  Interim History:  Ms. Foulk presents today for a followup visit. Since her last visit, she continues to do wee. She did report perianal irritation and was diagnosed with cellulitis. She is currently using an antibiotic cream which have helped. She did not report any hematochezia or melena. She did not report any constitutional symptoms of fevers or chills or sweats. No reported weight loss or appetite changes.  She continues to live independently and attends to activities of daily living. She has not reported any headaches or blurry vision or syncope. Did not really skin rashes or lesions. She did not report any chest pain or palpitation orthopnea. Does not report any wheezing or shortness of breath. She continues to be functional and perform activities of daily living without any hindrance to decline. Rest of her review of systems unremarkable.  Medications: I have reviewed the patient's current medications.  Current Outpatient Prescriptions  Medication Sig Dispense Refill  . ALPRAZolam (XANAX) 0.25 MG tablet Take 0.25  mg by mouth 2 (two) times daily as needed for anxiety. For anxiety    . aspirin EC 81 MG tablet Take 81 mg by mouth at bedtime.    . Cholecalciferol (VITAMIN D) 2000 UNITS CAPS Take 1 capsule by mouth at bedtime.    . docusate sodium (COLACE) 100 MG capsule Take 100 mg by mouth daily as needed.    . hydrochlorothiazide (HYDRODIURIL) 25 MG tablet Take 12.5 mg by mouth daily after breakfast.    . losartan (COZAAR) 50 MG tablet Take 50 mg by mouth daily.    Marland Kitchen lovastatin (MEVACOR) 40 MG tablet Take 40 mg by mouth every evening.    . Multiple Vitamins-Minerals (OCUVITE PRESERVISION PO) Take by mouth 2 (two) times daily.    . mupirocin cream (BACTROBAN) 2 % Apply 1 application topically 3 (three) times daily.  0  . oxyCODONE-acetaminophen (PERCOCET/ROXICET) 5-325 MG per tablet     . traMADol (ULTRAM) 50 MG tablet Take 50 mg by mouth every 6 (six) hours as needed.    . venlafaxine (EFFEXOR-XR) 75 MG 24 hr capsule Take 75 mg by mouth daily after breakfast.     No current facility-administered medications for this visit.     Allergies: No Known Allergies  Past Medical History, Surgical history, Social history, and Family History were reviewed and updated.   Physical Exam: Blood pressure 134/72, pulse 67, temperature 97.7 F (36.5 C), temperature source Oral, resp. rate 17, height 4\' 11"  (1.499 m), weight 140 lb 3.2 oz (63.594 kg), SpO2 95 %. ECOG: 0 General appearance: alert and cooperative Head: Normocephalic, without obvious abnormality Neck: no adenopathy Lymph nodes: Cervical, supraclavicular, and axillary nodes  normal. Heart:regular rate and rhythm, S1, S2 normal, no murmur, click, rub or gallop Lung:chest clear, no wheezing, rales, normal symmetric air entry Abdomin: soft, non-tender, without masses or organomegaly EXT:no erythema, induration, or nodules    Lab Results: Lab Results  Component Value Date   WBC 8.9 09/22/2014   HGB 15.6 09/22/2014   HCT 48.3* 09/22/2014   MCV  98.0 09/22/2014   PLT 214 09/22/2014     Chemistry      Component Value Date/Time   NA 142 09/22/2014 1022   NA 142 02/03/2014 1400   K 3.7 09/22/2014 1022   K 4.1 02/03/2014 1400   CL 98 02/03/2014 1400   CO2 32* 09/22/2014 1022   CO2 29 02/03/2014 1400   BUN 11.3 09/22/2014 1022   BUN 15 02/03/2014 1400   CREATININE 0.8 09/22/2014 1022   CREATININE 0.69 02/03/2014 1400      Component Value Date/Time   CALCIUM 10.8* 09/22/2014 1022   CALCIUM 11.0* 02/03/2014 1400   ALKPHOS 61 09/22/2014 1022   ALKPHOS 62 03/05/2013 1627   AST 18 09/22/2014 1022   AST 21 03/05/2013 1627   ALT 14 09/22/2014 1022   ALT 18 03/05/2013 1627   BILITOT 0.52 09/22/2014 1022   BILITOT 0.3 03/05/2013 1627      EXAM: CT ABDOMEN AND PELVIS WITH CONTRAST  TECHNIQUE: Multidetector CT imaging of the abdomen and pelvis was performed using the standard protocol following bolus administration of intravenous contrast.  CONTRAST: 101mL OMNIPAQUE IOHEXOL 300 MG/ML SOLN  COMPARISON: 03/26/2014  FINDINGS: Lower chest: The lung bases are clear of acute process. Scarring changes are noted. The heart is normal in size. No pericardial effusion. There is tortuosity, ectasia and heavy calcification of the thoracic aorta. A moderate size hiatal hernia is present.  Hepatobiliary: Diffuse fatty infiltration of the liver is noted. No focal hepatic lesions or intrahepatic biliary dilatation. The gallbladder is surgically absent. Mild common bile duct dilatation.  Pancreas: Normal.  Spleen: Normal.  Adrenals/Urinary Tract: The adrenal glands and kidneys are unremarkable. No masses or calculi.  Stomach/Bowel: The stomach, duodenum, small bowel and colon are unremarkable. There is a moderate-sized duodenum diverticulum. No inflammatory changes, mass lesions or obstructive findings.  Vascular/Lymphatic: Advanced atherosclerotic calcifications involving the aorta and branch vessels but no focal  aneurysm or dissection. No mesenteric or retroperitoneal mass or adenopathy.  Other: Significant artifact through the lower pelvis due to bilateral hip prosthesis. I do not see any obvious mass, adenopathy or free pelvic fluid collections. No inguinal mass or adenopathy.  Musculoskeletal: Advanced degenerative changes involving the pubic symphysis and SI joints and lower lumbar spine are noted  IMPRESSION: 1. Limited examination through the lower pelvis due to bilateral hip prosthesis. No obvious mass or adenopathy. 2. Moderate-sized hiatal hernia and large duodenum diverticulum. 3. No findings for abdominal metastatic disease. 4. Advanced atherosclerotic disease involving the aorta and branch vessels.   Impression and Plan:   80 year old woman with the following issues:  1. Squamous cell carcinoma of the perianal area. Clinical stage TII N0. She is status post surgical excision followed by adjuvant radiation therapy. She tolerated it well without any delayed complications.  Laboratory testing and CT scan on 09/22/2014 reviewed today and showed no evidence of relapse disease. The plan is to continue with clinical surveillance in 6 months and repeat imaging in 12 months.  2. Age-appropriate cancer screening: She is to schedule a colonoscopy and a mammogram in the near future.   Zola Button, MD 12/3/201510:18  AM

## 2014-12-01 ENCOUNTER — Encounter: Payer: Self-pay | Admitting: *Deleted

## 2014-12-02 ENCOUNTER — Ambulatory Visit
Admission: RE | Admit: 2014-12-02 | Discharge: 2014-12-02 | Disposition: A | Discharge status: Home / Self Care | Payer: Medicare Other | Source: Ambulatory Visit | Attending: Radiation Oncology | Admitting: Radiation Oncology

## 2014-12-02 VITALS — BP 148/87 | HR 63 | Temp 98.2°F | Resp 20 | Wt 143.0 lb

## 2014-12-02 DIAGNOSIS — F32A Depression, unspecified: Secondary | ICD-10-CM

## 2014-12-02 DIAGNOSIS — F329 Major depressive disorder, single episode, unspecified: Secondary | ICD-10-CM

## 2014-12-02 DIAGNOSIS — C44520 Squamous cell carcinoma of anal skin: Secondary | ICD-10-CM

## 2014-12-02 NOTE — Progress Notes (Signed)
Patient denies pain, urinary/bowel issues, loss of appetite. She states that in Dec she developed "cellulitis near the area where her cancer was". She saw her PCP, was on antibiotics and Mupirocin cream. She states this area has healed, but she noticed " a small knot near my vagina a week ago". She has been applying Mupirocin cream and states "it is smaller". She denies vaginal discharge; she does not see a gynecologist regularly. She states that since Christmas she has felt "down". Her daughter stayed with her for several weeks following a surgery, and [name] states "I am finally recovering."  She takes Effexor daily and Xanax prn.

## 2014-12-02 NOTE — Addendum Note (Signed)
Encounter addended by: Eppie Gibson, MD on: 12/02/2014  6:23 PM<BR>     Documentation filed: Arn Medal VN

## 2014-12-02 NOTE — Progress Notes (Signed)
Radiation Oncology         (336) [phone] ________________________________  Name: REED EIFERT MRN: [id number]  Date: 12/02/2014  DOB: [birth year]  Follow-Up Visit Note  Outpatient  CC: Gennette Pac, MD  Hulan Fess, MD  Diagnosis and Prior Radiotherapy:   Squamous Cell Carcinoma of perianal skin - cT2N0M0, Stage II  Indication for treatment: Curative, post-operative  Radiation treatment dates: 04/08/2014-05/14/2014  Site/dose: Right perianal skin / 45 Gy in 25 fractions   Narrative:  The patient returns today for routine follow-up.  She denies pain, urinary/bowel issues, loss of appetite. She states that in Dec she developed "cellulitis near the area where her cancer was". She saw her PCP, was on antibiotics and Mupirocin cream. She states this area has healed, but she noticed " a small knot near my vagina a week ago". She has been applying Mupirocin cream and states "it is smaller". She denies vaginal discharge; she does not see a gynecologist regularly. She states that since Christmas she has felt "down". Her daughter stayed with her for several weeks following a surgery, and Ms Josten states "I am finally recovering." She takes Effexor daily and Xanax prn.    ALLERGIES:  has No Known Allergies.  Meds: Current Outpatient Prescriptions  Medication Sig Dispense Refill  . ALPRAZolam (XANAX) 0.25 MG tablet Take 0.25 mg by mouth 2 (two) times daily as needed for anxiety. For anxiety    . aspirin EC 81 MG tablet Take 81 mg by mouth at bedtime.    . Cholecalciferol (VITAMIN D) 2000 UNITS CAPS Take 1 capsule by mouth at bedtime.    . docusate sodium (COLACE) 100 MG capsule Take 100 mg by mouth daily as needed.    . hydrochlorothiazide (HYDRODIURIL) 25 MG tablet Take 12.5 mg by mouth daily after breakfast.    . losartan (COZAAR) 50 MG tablet Take 50 mg by mouth daily.    Marland Kitchen lovastatin (MEVACOR) 40 MG tablet Take 40 mg by mouth every evening.    . Multiple Vitamins-Minerals (OCUVITE  PRESERVISION PO) Take by mouth 2 (two) times daily.    . mupirocin cream (BACTROBAN) 2 % Apply 1 application topically 3 (three) times daily.  0  . oxyCODONE-acetaminophen (PERCOCET/ROXICET) 5-325 MG per tablet     . traMADol (ULTRAM) 50 MG tablet Take 50 mg by mouth every 6 (six) hours as needed.    . venlafaxine (EFFEXOR-XR) 75 MG 24 hr capsule Take 75 mg by mouth daily after breakfast.     No current facility-administered medications for this encounter.    Physical Findings: The patient is in no acute distress. Patient is alert and oriented. Vitals with Age-Percentiles 9/32/3557  Length   Systolic 322  Diastolic 87  Pulse 63  Respiration 20  Weight 64.864 kg  BMI   VISIT REPORT   Neck - no cervical or SCV adenopathy.  Heart RRR. Chest: CTAB. Lymph: Groin - no masses.  Anus/ perianal skin is clear.  Small bump on right labia majora appears to be a small cyst or plugged follicle.  Does not appear malignant.  Lab Findings: Lab Results  Component Value Date   WBC 8.9 09/22/2014   HGB 15.6 09/22/2014   HCT 48.3* 09/22/2014   MCV 98.0 09/22/2014   PLT 214 09/22/2014    Radiographic Findings: No results found.  Impression/Plan:   Depressed mood - I spoke with her for a while about this and she is amenable to speaking with social work (I will refer) and discuss  with Dr. Rex Kras her medication management (on Effexor).  No sign of cancer recurrence.  I will see her back in 6 mo.  If the small bump on her vulva grows, she knows to call me, but this appears benign.  ____________________________________   Eppie Gibson, MD

## 2015-03-25 ENCOUNTER — Ambulatory Visit (HOSPITAL_BASED_OUTPATIENT_CLINIC_OR_DEPARTMENT_OTHER): Payer: Medicare Other | Admitting: Oncology

## 2015-03-25 ENCOUNTER — Telehealth: Payer: Self-pay | Admitting: Oncology

## 2015-03-25 ENCOUNTER — Other Ambulatory Visit (HOSPITAL_BASED_OUTPATIENT_CLINIC_OR_DEPARTMENT_OTHER): Payer: Medicare Other

## 2015-03-25 VITALS — BP 130/65 | HR 72 | Temp 98.1°F | Resp 18 | Ht 59.0 in | Wt 142.4 lb

## 2015-03-25 DIAGNOSIS — C44520 Squamous cell carcinoma of anal skin: Secondary | ICD-10-CM | POA: Diagnosis not present

## 2015-03-25 DIAGNOSIS — C44529 Squamous cell carcinoma of skin of other part of trunk: Secondary | ICD-10-CM

## 2015-03-25 DIAGNOSIS — C4492 Squamous cell carcinoma of skin, unspecified: Secondary | ICD-10-CM

## 2015-03-25 LAB — COMPREHENSIVE METABOLIC PANEL (CC13)
ALBUMIN: 3.4 g/dL — AB (ref 3.5–5.0)
ALK PHOS: 58 U/L (ref 40–150)
ALT: 14 U/L (ref 0–55)
AST: 18 U/L (ref 5–34)
Anion Gap: 8 mEq/L (ref 3–11)
BUN: 14.9 mg/dL (ref 7.0–26.0)
CALCIUM: 10 mg/dL (ref 8.4–10.4)
CHLORIDE: 105 meq/L (ref 98–109)
CO2: 28 meq/L (ref 22–29)
Creatinine: 0.7 mg/dL (ref 0.6–1.1)
EGFR: 80 mL/min/{1.73_m2} — ABNORMAL LOW (ref >90)
GLUCOSE: 91 mg/dL (ref 70–140)
Potassium: 3.8 mEq/L (ref 3.5–5.1)
Sodium: 142 mEq/L (ref 136–145)
Total Bilirubin: 0.48 mg/dL (ref 0.20–1.20)
Total Protein: 6.8 g/dL (ref 6.4–8.3)

## 2015-03-25 LAB — CBC WITH DIFFERENTIAL/PLATELET
BASO%: 0.8 % (ref 0.0–2.0)
BASOS ABS: 0.1 10*3/uL (ref 0.0–0.1)
EOS ABS: 0.1 10*3/uL (ref 0.0–0.5)
EOS%: 1.3 % (ref 0.0–7.0)
HEMATOCRIT: 47.8 % — AB (ref 34.8–46.6)
HEMOGLOBIN: 15.9 g/dL (ref 11.6–15.9)
LYMPH%: 13.8 % — ABNORMAL LOW (ref 14.0–49.7)
MCH: 31.7 pg (ref 25.1–34.0)
MCHC: 33.3 g/dL (ref 31.5–36.0)
MCV: 95.2 fL (ref 79.5–101.0)
MONO#: 0.9 10*3/uL (ref 0.1–0.9)
MONO%: 10.4 % (ref 0.0–14.0)
NEUT%: 73.7 % (ref 38.4–76.8)
NEUTROS ABS: 6.5 10*3/uL (ref 1.5–6.5)
Platelets: 190 10*3/uL (ref 145–400)
RBC: 5.02 10*6/uL (ref 3.70–5.45)
RDW: 14.3 % (ref 11.2–14.5)
WBC: 8.9 10*3/uL (ref 3.9–10.3)
lymph#: 1.2 10*3/uL (ref 0.9–3.3)

## 2015-03-25 NOTE — Progress Notes (Signed)
Hematology and Oncology Follow Up Visit  [name] [id number] 1934-09-08 81 y.o. 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 She presented with perianal mass measuring 1.5 cm. Clinical stage TII N0.  Prior Therapy:  On 02/06/2014 she underwent an excision of a perianal mass which was done successfully without any complications. The pathology showed squamous cell carcinoma with surgical margins appeared negative for carcinoma. The specimen described to be measuring 3.4 x 2.0 cm but the lesion measuring 2.5 x 2.1 x 0.7 cm. She is status post adjuvant radiation therapy to the pelvic and perianal area between 6/17 and 05/14/2014. She received a total of 45 gray in 25 fractions to the right perianal skin.  Current therapy: Observation and surveillance.  Interim History:  [name] presents today for a followup visit. Since her last visit, she reports no complications. She has not reported any pelvic pain or perianal irritation. She did not report any hematochezia or melena. She did not report any constitutional symptoms of fevers or chills or sweats. No reported weight loss or appetite changes.  She continues to live independently and attends to activities of daily living. She has not reported any headaches or blurry vision or syncope. Did not really skin rashes or lesions. She did not report any chest pain or palpitation orthopnea. Does not report any wheezing or shortness of breath. She continues to be functional and perform activities of daily living without any hindrance to decline. Rest of her review of systems unremarkable.  Medications: I have reviewed the patient's current medications.  Current Outpatient Prescriptions  Medication Sig Dispense Refill  . ALPRAZolam (XANAX) 0.25 MG tablet Take 0.25 mg by mouth 2 (two) times daily as needed for anxiety. For anxiety     . aspirin EC 81 MG tablet Take 81 mg by mouth at bedtime.    . Cholecalciferol (VITAMIN D) 2000 UNITS CAPS Take 1 capsule by mouth at bedtime.    . docusate sodium (COLACE) 100 MG capsule Take 100 mg by mouth daily as needed.    . hydrochlorothiazide (HYDRODIURIL) 25 MG tablet Take 12.5 mg by mouth daily after breakfast.    . losartan (COZAAR) 50 MG tablet Take 50 mg by mouth daily.    Marland Kitchen lovastatin (MEVACOR) 40 MG tablet Take 40 mg by mouth every evening.    . Multiple Vitamins-Minerals (OCUVITE PRESERVISION PO) Take by mouth 2 (two) times daily.    Marland Kitchen venlafaxine (EFFEXOR-XR) 75 MG 24 hr capsule Take 75 mg by mouth daily after breakfast.     No current facility-administered medications for this visit.     Allergies: No Known Allergies  Past Medical History, Surgical history, Social history, and Family History were reviewed and updated.   Physical Exam: Blood pressure 130/65, pulse 72, temperature 98.1 F (36.7 C), temperature source Oral, resp. rate 18, height 4\' 11"  (1.499 m), weight 142 lb 6.4 oz (64.592 kg). ECOG: 0 General appearance: alert and cooperative not in any distress. Head: Normocephalic, without obvious abnormality Neck: no adenopathy Lymph nodes: Cervical, supraclavicular, and axillary nodes normal. Heart:regular rate and rhythm, S1, S2 normal, no murmur, click, rub or gallop Lung:chest clear, no wheezing, rales, normal symmetric air entry Abdomin: soft, non-tender, without masses or organomegaly  No pelvic adenopathy palpated. EXT:no erythema, induration, or nodules    Lab Results: Lab Results  Component Value Date   WBC 8.9 03/25/2015  HGB 15.9 03/25/2015   HCT 47.8* 03/25/2015   MCV 95.2 03/25/2015   PLT 190 03/25/2015     Chemistry      Component Value Date/Time   NA 142 09/22/2014 1022   NA 142 02/03/2014 1400   K 3.7 09/22/2014 1022   K 4.1 02/03/2014 1400   CL 98 02/03/2014 1400   CO2 32* 09/22/2014 1022   CO2 29 02/03/2014 1400   BUN 11.3  09/22/2014 1022   BUN 15 02/03/2014 1400   CREATININE 0.8 09/22/2014 1022   CREATININE 0.69 02/03/2014 1400      Component Value Date/Time   CALCIUM 10.8* 09/22/2014 1022   CALCIUM 11.0* 02/03/2014 1400   ALKPHOS 61 09/22/2014 1022   ALKPHOS 62 03/05/2013 1627   AST 18 09/22/2014 1022   AST 21 03/05/2013 1627   ALT 14 09/22/2014 1022   ALT 18 03/05/2013 1627   BILITOT 0.52 09/22/2014 1022   BILITOT 0.3 03/05/2013 1627       Impression and Plan:   81 year old woman with the following issues:  1. Squamous cell carcinoma of the perianal area. Clinical stage TII N0. She is status post surgical excision followed by adjuvant radiation therapy. She tolerated it well without any delayed complications.  Laboratory testing and CT scan on 09/22/2014 reviewed today and showed no evidence of relapse disease.   Clinically she is doing well without any evidence to suggest recurrence. I will evaluate her on an annual basis and repeat imaging studies as needed.  2. Age-appropriate cancer screening: She is to up-to-date with mammography and colonoscopy.   [facility], MD 6/2/201610:31 AM

## 2015-03-25 NOTE — Telephone Encounter (Signed)
Gave adn printed appt sched and avs for pt for June 2017 °

## 2015-04-19 ENCOUNTER — Telehealth: Payer: Self-pay

## 2015-04-19 ENCOUNTER — Other Ambulatory Visit: Payer: Self-pay

## 2015-04-19 NOTE — Telephone Encounter (Signed)
Patient called to state she has noticed a rash/raised area around rectum extending to vaginal opening.Informed to notify surgeon per Dr.Squire.Patient thankful for advice and will call surgeon tomorrow.Patinent will be on vacation 04/24/15-04/28/15.

## 2015-05-05 ENCOUNTER — Ambulatory Visit: Payer: Self-pay | Admitting: General Surgery

## 2015-05-18 ENCOUNTER — Encounter (HOSPITAL_BASED_OUTPATIENT_CLINIC_OR_DEPARTMENT_OTHER): Payer: Self-pay | Admitting: *Deleted

## 2015-05-21 ENCOUNTER — Other Ambulatory Visit: Payer: Self-pay

## 2015-05-21 ENCOUNTER — Encounter (HOSPITAL_BASED_OUTPATIENT_CLINIC_OR_DEPARTMENT_OTHER)
Admission: RE | Admit: 2015-05-21 | Discharge: 2015-05-21 | Disposition: A | Discharge status: Home / Self Care | Payer: Medicare Other | Source: Ambulatory Visit | Attending: General Surgery | Admitting: General Surgery

## 2015-05-21 DIAGNOSIS — Z85048 Personal history of other malignant neoplasm of rectum, rectosigmoid junction, and anus: Secondary | ICD-10-CM | POA: Diagnosis not present

## 2015-05-21 DIAGNOSIS — Z79899 Other long term (current) drug therapy: Secondary | ICD-10-CM | POA: Diagnosis not present

## 2015-05-21 DIAGNOSIS — Z7982 Long term (current) use of aspirin: Secondary | ICD-10-CM | POA: Diagnosis not present

## 2015-05-21 DIAGNOSIS — F419 Anxiety disorder, unspecified: Secondary | ICD-10-CM | POA: Diagnosis not present

## 2015-05-21 DIAGNOSIS — F17200 Nicotine dependence, unspecified, uncomplicated: Secondary | ICD-10-CM | POA: Diagnosis not present

## 2015-05-21 DIAGNOSIS — K629 Disease of anus and rectum, unspecified: Secondary | ICD-10-CM | POA: Diagnosis present

## 2015-05-21 DIAGNOSIS — I10 Essential (primary) hypertension: Secondary | ICD-10-CM | POA: Diagnosis not present

## 2015-05-21 DIAGNOSIS — F329 Major depressive disorder, single episode, unspecified: Secondary | ICD-10-CM | POA: Diagnosis not present

## 2015-05-21 DIAGNOSIS — E780 Pure hypercholesterolemia: Secondary | ICD-10-CM | POA: Diagnosis not present

## 2015-05-21 LAB — BASIC METABOLIC PANEL
ANION GAP: 10 (ref 5–15)
BUN: 10 mg/dL (ref 6–20)
CO2: 28 mmol/L (ref 22–32)
Calcium: 10.3 mg/dL (ref 8.9–10.3)
Chloride: 102 mmol/L (ref 101–111)
Creatinine, Ser: 0.81 mg/dL (ref 0.44–1.00)
GFR calc Af Amer: >60 mL/min (ref >60)
GFR calc non Af Amer: >60 mL/min (ref >60)
Glucose, Bld: 107 mg/dL — ABNORMAL HIGH (ref 65–99)
Potassium: 3.9 mmol/L (ref 3.5–5.1)
Sodium: 140 mmol/L (ref 135–145)

## 2015-05-23 NOTE — H&P (Signed)
History of Present Illness Denise Neri E. Grandville Silos MD; 05/05/2015 11:47 AM) Patient words: rectal f/u.  The patient is a 81 year old female who presents to discuss consultation. Tansy has a history of an anal squamous cell carcinoma. She is status post excision and subsequent chemoradiation. She has developed a couple areas that her physician was concerned about around her anal area and she presents for long-term follow-up. She occasionally has noted some bleeding down there. She thought she felt a small lump between her anal area and her vagina.   Other Problems Ventura Sellers, CMA; 05/05/2015 11:40 AM) Anxiety Disorder Arthritis Back Pain Cholelithiasis Depression Hypercholesterolemia Melanoma Oophorectomy  Past Surgical History Ventura Sellers, CMA; 05/05/2015 11:40 AM) Appendectomy Cataract Surgery Left. Colon Polyp Removal - Colonoscopy Gallbladder Surgery - Open Hip Surgery Bilateral. Hysterectomy (not due to cancer) - Complete Knee Surgery Right.  Diagnostic Studies History Ventura Sellers, [place]; 05/05/2015 11:40 AM) Colonoscopy within last year Mammogram within last year Pap Smear 1-5 years ago  Allergies Ventura Sellers, CMA; 05/05/2015 11:40 AM) No Known Drug Allergies07/13/2016  Medication History Ventura Sellers, [place]; 05/05/2015 11:41 AM) ALPRAZolam (0.25MG  Tablet, Oral) Active. Hydrochlorothiazide (25MG  Tablet, Oral) Active. Losartan Potassium (50MG  Tablet, Oral) Active. Lovastatin (40MG  Tablet, Oral) Active. Venlafaxine HCl ER (75MG  Capsule ER 24HR, Oral) Active. Aspirin (81MG  Tablet DR, Oral) Active. Colace (100MG  Capsule, Oral) Active. Vitamin D (2000UNIT Capsule, Oral) Active.  Social History Ventura Sellers, [place]; 05/05/2015 11:40 AM) Caffeine use Carbonated beverages, Coffee, Tea. No alcohol use No drug use Tobacco use Current every day smoker.  Family History Ventura Sellers, [place]; 05/05/2015 11:40  AM) Alcohol Abuse Father. Arthritis Mother. Cerebrovascular Accident Mother. Colon Cancer Sister. Heart Disease Father. Hypertension Father. Migraine Headache Daughter. Thyroid problems Daughter.  Pregnancy / Birth History Ventura Sellers, [place]; 05/05/2015 11:40 AM) Age at menarche 5 years. Age of menopause 33-50 Gravida 4 Maternal age 32-20 Para 4  Review of Systems Sharyn Lull R. Brooks CMA; 05/05/2015 11:40 AM) General Not Present- Appetite Loss, Chills, Fatigue, Fever, Night Sweats, Weight Gain and Weight Loss. Skin Present- New Lesions. Not Present- Change in Wart/Mole, Dryness, Hives, Jaundice, Non-Healing Wounds, Rash and Ulcer. HEENT Present- Wears glasses/contact lenses. Not Present- Earache, Hearing Loss, Hoarseness, Nose Bleed, Oral Ulcers, Ringing in the Ears, Seasonal Allergies, Sinus Pain, Sore Throat, Visual Disturbances and Yellow Eyes. Respiratory Not Present- Bloody sputum, Chronic Cough, Difficulty Breathing, Snoring and Wheezing. Breast Not Present- Breast Mass, Breast Pain, Nipple Discharge and Skin Changes. Cardiovascular Present- Leg Cramps. Not Present- Chest Pain, Difficulty Breathing Lying Down, Palpitations, Rapid Heart Rate, Shortness of Breath and Swelling of Extremities. Gastrointestinal Present- Rectal Pain. Not Present- Abdominal Pain, Bloating, Bloody Stool, Change in Bowel Habits, Chronic diarrhea, Constipation, Difficulty Swallowing, Excessive gas, Gets full quickly at meals, Hemorrhoids, Indigestion, Nausea and Vomiting. Female Genitourinary Present- Urgency. Not Present- Frequency, Nocturia, Painful Urination and Pelvic Pain. Musculoskeletal Present- Back Pain and Joint Pain. Not Present- Joint Stiffness, Muscle Pain, Muscle Weakness and Swelling of Extremities. Neurological Present- Trouble walking. Not Present- Decreased Memory, Fainting, Headaches, Numbness, Seizures, Tingling, Tremor and Weakness. Psychiatric Present- Anxiety. Not  Present- Bipolar, Change in Sleep Pattern, Depression, Fearful and Frequent crying. Endocrine Not Present- Cold Intolerance, Excessive Hunger, Hair Changes, Heat Intolerance, Hot flashes and New Diabetes. Hematology Present- Easy Bruising. Not Present- Excessive bleeding, Gland problems, HIV and Persistent Infections.   Vitals [employer] R. Brooks CMA; 05/05/2015 11:40 AM) 05/05/2015 11:40 AM Weight: 141 lb Height: 60in Body Surface Area: 1.65  m Body Mass Index: 27.54 kg/m BP: 126/78 (Sitting, Left Arm, Standard)    Physical Exam Denise Neri E. Grandville Silos MD; 05/05/2015 11:49 AM) General Mental Status-Alert. General Appearance-Consistent with stated age. Hydration-Well hydrated. Voice-Normal.  Head and Neck Head-normocephalic, atraumatic with no lesions or palpable masses.  Eye Eyeball - Bilateral-Extraocular movements intact. Sclera/Conjunctiva - Bilateral-No scleral icterus.  Chest and Lung Exam Chest and lung exam reveals -quiet, even and easy respiratory effort with no use of accessory muscles and on auscultation, normal breath sounds, no adventitious sounds and normal vocal resonance. Inspection Chest Wall - Normal. Back - normal.  Cardiovascular Cardiovascular examination reveals -on palpation PMI is normal in location and amplitude, no palpable S3 or S4. Normal cardiac borders., normal heart sounds, regular rate and rhythm with no murmurs, carotid auscultation reveals no bruits and normal pedal pulses bilaterally.  Abdomen Inspection Inspection of the abdomen reveals - No Hernias. Skin - Scar - no surgical scars. Palpation/Percussion Palpation and Percussion of the abdomen reveal - Soft, Non Tender, No Rebound tenderness, No Rigidity (guarding) and No hepatosplenomegaly. Auscultation Auscultation of the abdomen reveals - Bowel sounds normal.  Rectal Note: Previous excision site has healed. She has a 5 mm nodule on the left side of her anal verge.  There is also a 8 mm nodule on the anterior portion of her anal area. Both are firm but not fixed. No ulceration.   Neurologic Neurologic evaluation reveals -alert and oriented x 3 with no impairment of recent or remote memory. Mental Status-Normal.  Musculoskeletal Normal Exam - Left-Upper Extremity Strength Normal and Lower Extremity Strength Normal. Normal Exam - Right-Upper Extremity Strength Normal and Lower Extremity Strength Normal.    Assessment & Plan Denise Neri E. Grandville Silos MD; 05/05/2015 11:49 AM) ANAL SQUAMOUS CELL CARCINOMA (154.3  C21.0) Impression: In light of her history and these 2 new nodules, I would recommend excising them in the operating room as an outpatient surgical procedure so we may send them for pathology. Procedure, risks, and benefits were discussed in detail with her. We will schedule in the near future.  Georganna Skeans, MD, MPH, FACS Trauma: [phone] General Surgery: [phone]

## 2015-05-24 ENCOUNTER — Ambulatory Visit (HOSPITAL_BASED_OUTPATIENT_CLINIC_OR_DEPARTMENT_OTHER): Payer: Medicare Other | Admitting: Anesthesiology

## 2015-05-24 ENCOUNTER — Encounter (HOSPITAL_BASED_OUTPATIENT_CLINIC_OR_DEPARTMENT_OTHER)
Admission: RE | Disposition: A | Discharge status: Home / Self Care | Payer: Self-pay | Source: Ambulatory Visit | Attending: General Surgery

## 2015-05-24 ENCOUNTER — Ambulatory Visit (HOSPITAL_BASED_OUTPATIENT_CLINIC_OR_DEPARTMENT_OTHER)
Admission: RE | Admit: 2015-05-24 | Discharge: 2015-05-24 | Disposition: A | Discharge status: Home / Self Care | Payer: Medicare Other | Source: Ambulatory Visit | Attending: General Surgery | Admitting: General Surgery

## 2015-05-24 ENCOUNTER — Encounter (HOSPITAL_BASED_OUTPATIENT_CLINIC_OR_DEPARTMENT_OTHER): Payer: Self-pay | Admitting: Anesthesiology

## 2015-05-24 DIAGNOSIS — E780 Pure hypercholesterolemia: Secondary | ICD-10-CM | POA: Diagnosis not present

## 2015-05-24 DIAGNOSIS — Z85048 Personal history of other malignant neoplasm of rectum, rectosigmoid junction, and anus: Secondary | ICD-10-CM | POA: Insufficient documentation

## 2015-05-24 DIAGNOSIS — F419 Anxiety disorder, unspecified: Secondary | ICD-10-CM | POA: Insufficient documentation

## 2015-05-24 DIAGNOSIS — I10 Essential (primary) hypertension: Secondary | ICD-10-CM | POA: Insufficient documentation

## 2015-05-24 DIAGNOSIS — Z7982 Long term (current) use of aspirin: Secondary | ICD-10-CM | POA: Insufficient documentation

## 2015-05-24 DIAGNOSIS — F17200 Nicotine dependence, unspecified, uncomplicated: Secondary | ICD-10-CM | POA: Insufficient documentation

## 2015-05-24 DIAGNOSIS — F329 Major depressive disorder, single episode, unspecified: Secondary | ICD-10-CM | POA: Insufficient documentation

## 2015-05-24 DIAGNOSIS — K629 Disease of anus and rectum, unspecified: Secondary | ICD-10-CM | POA: Diagnosis not present

## 2015-05-24 DIAGNOSIS — Z79899 Other long term (current) drug therapy: Secondary | ICD-10-CM | POA: Insufficient documentation

## 2015-05-24 HISTORY — PX: MASS EXCISION: SHX2000

## 2015-05-24 LAB — POCT HEMOGLOBIN-HEMACUE: Hemoglobin: 15.9 g/dL — ABNORMAL HIGH (ref 12.0–15.0)

## 2015-05-24 SURGERY — EXCISION MASS
Anesthesia: General | Site: Anus

## 2015-05-24 MED ORDER — HYDROMORPHONE HCL 1 MG/ML IJ SOLN: 0.2500 mg | INTRAMUSCULAR | Status: DC | PRN

## 2015-05-24 MED ORDER — PROMETHAZINE HCL 25 MG/ML IJ SOLN: 6.2500 mg | INTRAMUSCULAR | Status: DC | PRN

## 2015-05-24 MED ORDER — CHLORHEXIDINE GLUCONATE 4 % EX LIQD: 1.0000 "application " | Freq: Once | CUTANEOUS | Status: DC

## 2015-05-24 MED ORDER — BUPIVACAINE-EPINEPHRINE (PF) 0.5% -1:200000 IJ SOLN
INTRAMUSCULAR | Status: AC
  Filled 2015-05-24: qty 30

## 2015-05-24 MED ORDER — HYDROCODONE-ACETAMINOPHEN 7.5-325 MG PO TABS: 1.0000 | ORAL_TABLET | Freq: Once | ORAL | Status: DC | PRN

## 2015-05-24 MED ORDER — SCOPOLAMINE 1 MG/3DAYS TD PT72: 1.0000 | MEDICATED_PATCH | Freq: Once | TRANSDERMAL | Status: DC | PRN

## 2015-05-24 MED ORDER — GLYCOPYRROLATE 0.2 MG/ML IJ SOLN
0.2000 mg | Freq: Once | INTRAMUSCULAR | Status: AC | PRN
  Administered 2015-05-24: 0.2 mg via INTRAVENOUS

## 2015-05-24 MED ORDER — BUPIVACAINE-EPINEPHRINE 0.5% -1:200000 IJ SOLN
INTRAMUSCULAR | Status: DC | PRN
  Administered 2015-05-24: 10 mL

## 2015-05-24 MED ORDER — CEFAZOLIN SODIUM-DEXTROSE 2-3 GM-% IV SOLR
2.0000 g | INTRAVENOUS | Status: AC
  Administered 2015-05-24: 2 g via INTRAVENOUS

## 2015-05-24 MED ORDER — FENTANYL CITRATE (PF) 100 MCG/2ML IJ SOLN
INTRAMUSCULAR | Status: AC
  Filled 2015-05-24: qty 6

## 2015-05-24 MED ORDER — MIDAZOLAM HCL 2 MG/2ML IJ SOLN: 1.0000 mg | INTRAMUSCULAR | Status: DC | PRN

## 2015-05-24 MED ORDER — ONDANSETRON HCL 4 MG/2ML IJ SOLN
INTRAMUSCULAR | Status: DC | PRN
  Administered 2015-05-24: 4 mg via INTRAVENOUS

## 2015-05-24 MED ORDER — FENTANYL CITRATE (PF) 100 MCG/2ML IJ SOLN
INTRAMUSCULAR | Status: DC | PRN
  Administered 2015-05-24: 25 ug via INTRAVENOUS

## 2015-05-24 MED ORDER — THROMBIN 5000 UNITS EX SOLR
CUTANEOUS | Status: AC
  Filled 2015-05-24: qty 5000

## 2015-05-24 MED ORDER — OXYCODONE HCL 5 MG PO TABS: 5.0000 mg | ORAL_TABLET | Freq: Four times a day (QID) | ORAL | Status: DC | PRN

## 2015-05-24 MED ORDER — FENTANYL CITRATE (PF) 100 MCG/2ML IJ SOLN: 50.0000 ug | INTRAMUSCULAR | Status: DC | PRN

## 2015-05-24 MED ORDER — CEFAZOLIN SODIUM-DEXTROSE 2-3 GM-% IV SOLR
INTRAVENOUS | Status: AC
  Filled 2015-05-24: qty 50

## 2015-05-24 MED ORDER — LACTATED RINGERS IV SOLN
INTRAVENOUS | Status: DC
  Administered 2015-05-24: 09:00:00 via INTRAVENOUS

## 2015-05-24 MED ORDER — PROPOFOL 10 MG/ML IV BOLUS
INTRAVENOUS | Status: DC | PRN
  Administered 2015-05-24: 10 mg via INTRAVENOUS

## 2015-05-24 SURGICAL SUPPLY — 46 items
BLADE HEX COATED 2.75 (ELECTRODE) ×1 IMPLANT
BLADE SURG 11 STRL SS (BLADE) IMPLANT
BLADE SURG 15 STRL LF DISP TIS (BLADE) ×1 IMPLANT
BLADE SURG 15 STRL SS (BLADE) ×2
BRIEF STRETCH FOR OB PAD LRG (UNDERPADS AND DIAPERS) ×2 IMPLANT
CANISTER SUCT 1200ML W/VALVE (MISCELLANEOUS) ×2 IMPLANT
CLEANER CAUTERY TIP 5X5 PAD (MISCELLANEOUS) IMPLANT
COVER MAYO STAND STRL (DRAPES) ×2 IMPLANT
DECANTER SPIKE VIAL GLASS SM (MISCELLANEOUS) ×1 IMPLANT
DRAPE UTILITY XL STRL (DRAPES) ×1 IMPLANT
DRSG EMULSION OIL 3X3 NADH (GAUZE/BANDAGES/DRESSINGS) IMPLANT
DRSG PAD ABDOMINAL 8X10 ST (GAUZE/BANDAGES/DRESSINGS) ×1 IMPLANT
ELECT REM PT RETURN 9FT ADLT (ELECTROSURGICAL) ×2
ELECTRODE REM PT RTRN 9FT ADLT (ELECTROSURGICAL) ×1 IMPLANT
GAUZE PETROLATUM 1 X8 (GAUZE/BANDAGES/DRESSINGS) IMPLANT
GLOVE BIO SURGEON STRL SZ8 (GLOVE) ×2 IMPLANT
GLOVE BIOGEL PI IND STRL 7.5 (GLOVE) IMPLANT
GLOVE BIOGEL PI IND STRL 8 (GLOVE) ×1 IMPLANT
GLOVE BIOGEL PI INDICATOR 7.5 (GLOVE) ×1
GLOVE BIOGEL PI INDICATOR 8 (GLOVE) ×1
GLOVE EXAM NITRILE MD LF STRL (GLOVE) ×1 IMPLANT
GLOVE SURG SS PI 7.0 STRL IVOR (GLOVE) ×1 IMPLANT
GOWN STRL REUS W/ TWL LRG LVL3 (GOWN DISPOSABLE) ×1 IMPLANT
GOWN STRL REUS W/ TWL XL LVL3 (GOWN DISPOSABLE) ×1 IMPLANT
GOWN STRL REUS W/TWL LRG LVL3 (GOWN DISPOSABLE) ×2
GOWN STRL REUS W/TWL XL LVL3 (GOWN DISPOSABLE) ×2
NEEDLE HYPO 22GX1.5 SAFETY (NEEDLE) ×1 IMPLANT
PACK BASIN DAY SURGERY FS (CUSTOM PROCEDURE TRAY) ×2 IMPLANT
PACK LITHOTOMY IV (CUSTOM PROCEDURE TRAY) ×2 IMPLANT
PAD CLEANER CAUTERY TIP 5X5 (MISCELLANEOUS)
PENCIL BUTTON HOLSTER BLD 10FT (ELECTRODE) ×2 IMPLANT
SHEARS HARMONIC 9CM CVD (BLADE) IMPLANT
SLEEVE SCD COMPRESS KNEE MED (MISCELLANEOUS) ×2 IMPLANT
SPONGE GAUZE 4X4 12PLY STER LF (GAUZE/BANDAGES/DRESSINGS) ×2 IMPLANT
SPONGE HEMORRHOID 8X3CM (HEMOSTASIS) ×2 IMPLANT
SPONGE SURGIFOAM ABS GEL 100 (HEMOSTASIS) IMPLANT
SURGILUBE 2OZ TUBE FLIPTOP (MISCELLANEOUS) ×1 IMPLANT
SUT CHROMIC 3 0 SH 27 (SUTURE) IMPLANT
SYR CONTROL 10ML LL (SYRINGE) ×2 IMPLANT
TOWEL OR 17X24 6PK STRL BLUE (TOWEL DISPOSABLE) ×2 IMPLANT
TOWEL OR NON WOVEN STRL DISP B (DISPOSABLE) ×1 IMPLANT
TRAY DSU PREP LF (CUSTOM PROCEDURE TRAY) ×2 IMPLANT
TRAY PROCTOSCOPIC FIBER OPTIC (SET/KITS/TRAYS/PACK) IMPLANT
TUBE CONNECTING 20X1/4 (TUBING) ×2 IMPLANT
UNDERPAD 30X30 (UNDERPADS AND DIAPERS) ×2 IMPLANT
YANKAUER SUCT BULB TIP NO VENT (SUCTIONS) ×2 IMPLANT

## 2015-05-24 NOTE — Anesthesia Postprocedure Evaluation (Signed)
  Anesthesia Post-op Note  Patient: [name]  Procedure(s) Performed: Procedure(s): EXCISION ANAL MASS X 2 (N/A)  Patient Location: PACU  Anesthesia Type:General  Level of Consciousness: awake, alert  and oriented  Airway and Oxygen Therapy: Patient Spontanous Breathing  Post-op Pain: mild  Post-op Assessment: Post-op Vital signs reviewed              Post-op Vital Signs: Reviewed  Last Vitals:  Filed Vitals:   05/24/15 1013  BP: 147/69  Pulse: 94  Temp: 36.6 C  Resp: 16    Complications: No apparent anesthesia complications

## 2015-05-24 NOTE — Op Note (Signed)
05/24/2015  9:28 AM  PATIENT:  [name]  81 y.o. female  PRE-OPERATIVE DIAGNOSIS:  history of squanous cell cancer, anal mass X 2  POST-OPERATIVE DIAGNOSIS:  history of squanous cell cancer, anal mass X 2  PROCEDURE:  Procedure(s): EXCISION ANAL MASS X 2  SURGEON:  Surgeon(s): Georganna Skeans, MD  ASSISTANTS: none   ANESTHESIA:   local and general  EBL:     BLOOD ADMINISTERED:none  DRAINS: none   SPECIMEN:  Excision  DISPOSITION OF SPECIMEN:  PATHOLOGY  COUNTS:  YES  DICTATION: .Dragon Dictation Keyli has a history anal squamous cell carcinoma. She developed 2 nodules in her perianal region and I saw her in my office. We plan to excise these and send them to pathology and make sure this does not represent recurrence. She notes that the one nodule around 12:00 between her anal area and her vagina has gone away. Informed consent was obtained. She received intravenous antibiotics. She was brought to the operating room and general anesthesia with laryngeal mask airway was administered by the anesthesia staff. She was positioned in lithotomy. Her perianal region was prepped and draped in a sterile fashion. We did time out procedure. I injected local anesthetic. Close examination reveals persistence of the small mass at 4:00 with a very small second mass right next to it. The other region, around the 12:00 position, reveals that the mass is indeed gone. There is a tiny palpable bump where it had been. Decision was made to remove both of these areas. There were no other palpable or visible abnormalities around her anal opening. She did have a couple small hemorrhoids. Attention was directed to the floor o'clock area first. This was sharply excised including both the larger and smaller portion. These were sent to pathology as 4:00 position. The small wound was cauterized and good hemostasis was obtained. Next, the tiny area at the 12:00 position was similarly excised and cauterized. This  was sent to pathology as well as 12:00 position. Hemostasis was ensured in both wounds. A sterile dressing was applied. There were no apparent complications and all counts were correct. She was taken recovery in stable condition. PATIENT DISPOSITION:  PACU - hemodynamically stable.   Delay start of Pharmacological VTE agent (>24hrs) due to surgical blood loss or risk of bleeding:  no  Georganna Skeans, MD, MPH, FACS Pager: [phone]  8/1/20169:28 AM

## 2015-05-24 NOTE — Transfer of Care (Signed)
Immediate Anesthesia Transfer of Care Note  Patient: [name]  Procedure(s) Performed: Procedure(s): EXCISION ANAL MASS X 2 (N/A)  Patient Location: PACU  Anesthesia Type:General  Level of Consciousness: sedated  Airway & Oxygen Therapy: Patient Spontanous Breathing and Patient connected to face mask oxygen  Post-op Assessment: Report given to RN and Post -op Vital signs reviewed and stable  Post vital signs: Reviewed and stable  Last Vitals:  Filed Vitals:   05/24/15 0936  BP:   Pulse: 65  Temp:   Resp: 18    Complications: No apparent anesthesia complications

## 2015-05-24 NOTE — Discharge Instructions (Signed)

## 2015-05-24 NOTE — Interval H&P Note (Signed)
History and Physical Interval Note:  05/24/2015 8:26 AM  [name]  has presented today for surgery, with the diagnosis of history of squanous cell cancer  The various methods of treatment have been discussed with the patient and family. After consideration of risks, benefits and other options for treatment, the patient has consented to  Procedure(s): EXCISION ANAL MASS X 2 (N/A) as a surgical intervention .  The patient's history has been reviewed, patient examined, no change in status, stable for surgery.  I have reviewed the patient's chart and labs.  Questions were answered to the patient's satisfaction.     [name] E

## 2015-05-24 NOTE — Anesthesia Preprocedure Evaluation (Addendum)
Anesthesia Evaluation  Patient identified by MRN, date of birth, ID band Patient awake    Reviewed: Allergy & Precautions, H&P , NPO status , Patient's Chart, lab work & pertinent test results  Airway Mallampati: II  TM Distance: >3 FB Neck ROM: Full    Dental no notable dental hx. (+) Teeth Intact, Dental Advisory Given   Pulmonary COPDCurrent Smoker,  breath sounds clear to auscultation  Pulmonary exam normal       Cardiovascular hypertension, Pt. on medications Rhythm:Regular Rate:Normal     Neuro/Psych Depression negative neurological ROS     GI/Hepatic negative GI ROS, Neg liver ROS,   Endo/Other  negative endocrine ROS  Renal/GU negative Renal ROS  negative genitourinary   Musculoskeletal   Abdominal   Peds  Hematology negative hematology ROS (+)   Anesthesia Other Findings   Reproductive/Obstetrics negative OB ROS                            Anesthesia Physical  Anesthesia Plan  ASA: II  Anesthesia Plan: General   Post-op Pain Management:    Induction: Intravenous  Airway Management Planned: LMA and Oral ETT  Additional Equipment:   Intra-op Plan:   Post-operative Plan: Extubation in OR  Informed Consent: I have reviewed the patients History and Physical, chart, labs and discussed the procedure including the risks, benefits and alternatives for the proposed anesthesia with the patient or authorized representative who has indicated his/her understanding and acceptance.   Dental advisory given  Plan Discussed with: CRNA  Anesthesia Plan Comments:         Anesthesia Quick Evaluation

## 2015-05-24 NOTE — Anesthesia Procedure Notes (Signed)
Procedure Name: LMA Insertion Date/Time: 05/24/2015 9:06 AM Performed by: Toula Moos L Pre-anesthesia Checklist: Patient identified, Emergency Drugs available, Suction available, Patient being monitored and Timeout performed Patient Re-evaluated:Patient Re-evaluated prior to inductionOxygen Delivery Method: Circle System Utilized Preoxygenation: Pre-oxygenation with 100% oxygen Intubation Type: IV induction Ventilation: Mask ventilation without difficulty LMA: LMA inserted LMA Size: 4.0 Number of attempts: 1 Airway Equipment and Method: Bite block Placement Confirmation: positive ETCO2 Tube secured with: Tape Dental Injury: Teeth and Oropharynx as per pre-operative assessment

## 2015-05-25 ENCOUNTER — Encounter (HOSPITAL_BASED_OUTPATIENT_CLINIC_OR_DEPARTMENT_OTHER): Payer: Self-pay | Admitting: General Surgery

## 2015-05-28 ENCOUNTER — Ambulatory Visit
Admission: RE | Admit: 2015-05-28 | Discharge: 2015-05-28 | Disposition: A | Discharge status: Home / Self Care | Payer: Medicare Other | Source: Ambulatory Visit | Attending: Radiation Oncology | Admitting: Radiation Oncology

## 2015-05-28 ENCOUNTER — Encounter: Payer: Self-pay | Admitting: Radiation Oncology

## 2015-05-28 VITALS — BP 164/80 | HR 80 | Temp 97.8°F | Resp 16 | Ht 60.0 in | Wt 141.3 lb

## 2015-05-28 DIAGNOSIS — C44520 Squamous cell carcinoma of anal skin: Secondary | ICD-10-CM

## 2015-05-28 NOTE — Progress Notes (Signed)
Radiation Oncology         ([phone]) [phone] ________________________________  Name: [name] MRN: [id number]  Date: 05/28/2015  DOB: 04/20/1934  Follow-Up Visit Note  Outpatient  CC: Gennette Pac, MD  Wyatt Portela, MD  Diagnosis and Prior Radiotherapy:   Squamous Cell Carcinoma of perianal skin - cT2N0M0, Stage II  Indication for treatment: Curative, post-operative  Radiation treatment dates: 04/08/2014-05/14/2014  Site/dose: Right perianal skin / 45 Gy in 25 fractions   Narrative:  The patient returns today for routine follow-up.  She denies pain although she has some soreness in her rectal area from the excision of 2 anal masses on Monday. The biopsies were done to rule out recurrence. She reports seeing a "smear" of rectal bleeding with wiping. She reports not having a bowel movement since Monday. She is taking colace and will add a laxative per Dr. Grandville Silos. She reports having a good appetite and denies nausea. She denies having any bladder issues. She reports having a cold since mid July. She did take Zithromax and said it is getting better. She is still smoking though she is working to decrease the amount.   Biopsy results from 05/24/2015 revealed benign squamous mucosa with subepithelial sclerosis.   ALLERGIES:  has No Known Allergies.  Meds: Current Outpatient Prescriptions  Medication Sig Dispense Refill  . ALPRAZolam (XANAX) 0.25 MG tablet Take 0.25 mg by mouth 2 (two) times daily as needed for anxiety. For anxiety    . aspirin EC 81 MG tablet Take 81 mg by mouth at bedtime.    . Cholecalciferol (VITAMIN D) 2000 UNITS CAPS Take 1 capsule by mouth at bedtime.    . docusate sodium (COLACE) 100 MG capsule Take 100 mg by mouth daily as needed.    . hydrochlorothiazide (HYDRODIURIL) 25 MG tablet Take 12.5 mg by mouth daily after breakfast.    . losartan (COZAAR) 50 MG tablet Take 50 mg by mouth daily.    Marland Kitchen lovastatin (MEVACOR) 40 MG tablet Take 40 mg by mouth every  evening.    . Multiple Vitamins-Minerals (OCUVITE PRESERVISION PO) Take by mouth 2 (two) times daily.    Marland Kitchen oxyCODONE (OXY IR/ROXICODONE) 5 MG immediate release tablet Take 1-2 tablets (5-10 mg total) by mouth every 6 (six) hours as needed (Pain). 30 tablet 0  . venlafaxine (EFFEXOR-XR) 75 MG 24 hr capsule Take 75 mg by mouth daily after breakfast.     No current facility-administered medications for this encounter.    Physical Findings: The patient is in no acute distress. Patient is alert and oriented. External genitalia is normal with some small post surgical areas in the perineum that are healing. No lesions felt in the anal canal on digital rectal exam. No lymphadenopathy palpated in the groin.     Lab Findings: Lab Results  Component Value Date   WBC 8.9 03/25/2015   HGB 15.9* 05/24/2015   HCT 47.8* 03/25/2015   MCV 95.2 03/25/2015   PLT 190 03/25/2015    Radiographic Findings: No results found.  Impression/Plan:  She will see Dr. Grandville Silos again later this month. She will see Dr. Alen Blew in medical oncology again in June 2017. We will follow up in 6 months. Card given to schedule. The patient continues to use tobacco. The patient was counseled to stop using tobacco due to risk of malignancies from this.   This document serves as a record of services personally performed by Eppie Gibson, MD. It was created on her behalf by Benjamine Mola  Caryl Pina, a trained medical scribe. The creation of this record is based on the scribe's personal observations and the provider's statements to them. This document has been checked and approved by the attending provider.  ____________________________________   Eppie Gibson, MD

## 2015-05-28 NOTE — Progress Notes (Signed)
[name] here for follow up.  She denies pain although she has some soreness in her rectal area from the excision of 2 anal masses on Monday.  She reports seeing a "smear" of rectal bleeding with wiping.  She reports not having a bowel movement since Monday.  She is taking colace and will add a laxative per [name].  She reports having a good appetite and denies nausea.  She denies having any bladder issues.  She reports having a cold since mid July.  She did take Zithromax and said it is getting better.  BP 164/80 mmHg  Pulse 80  Temp(Src) 97.8 F (36.6 C) (Oral)  Resp 16  Ht 5' (1.524 m)  Wt 141 lb 4.8 oz (64.093 kg)  BMI 27.60 kg/m2  SpO2 98%

## 2015-12-01 ENCOUNTER — Ambulatory Visit: Payer: Medicare Other | Admitting: Radiation Oncology

## 2015-12-10 ENCOUNTER — Ambulatory Visit: Payer: Medicare Other | Admitting: Radiation Oncology

## 2015-12-17 ENCOUNTER — Other Ambulatory Visit: Payer: Self-pay | Admitting: Family Medicine

## 2015-12-17 DIAGNOSIS — M5412 Radiculopathy, cervical region: Secondary | ICD-10-CM

## 2015-12-22 ENCOUNTER — Telehealth: Payer: Self-pay

## 2015-12-22 ENCOUNTER — Encounter: Payer: Self-pay | Admitting: Radiation Oncology

## 2015-12-22 ENCOUNTER — Ambulatory Visit
Admission: RE | Admit: 2015-12-22 | Discharge: 2015-12-22 | Disposition: A | Discharge status: Home / Self Care | Payer: Medicare Other | Source: Ambulatory Visit | Attending: Radiation Oncology | Admitting: Radiation Oncology

## 2015-12-22 VITALS — BP 130/71 | HR 71 | Temp 98.1°F | Ht 60.0 in | Wt 126.7 lb

## 2015-12-22 DIAGNOSIS — C44520 Squamous cell carcinoma of anal skin: Secondary | ICD-10-CM

## 2015-12-22 NOTE — Progress Notes (Signed)
[name] presents for follow up of radiation completed 05/14/2014 to her Right perianal skin. She reports pain of 10/10 to her bilateral shoulder blades and upper spine. She relates this pain to Prolia which she took on 09/09/15. She begain to experience this pain about Christmas time. She takes oxycodone occasionally, some days she won't take any and other days she will take 1 or 2. She does have a MRI scheduled 12/29/15 to her neck. She also reports numbness in her right hand. She denies pain in her anal area. She reports recent difficulties with constipation which has been relieved with prunes. She reports bowel movements every other day. She denies blood in stool. She reports she is eating well, however she has had a twenty pound weight loss recently over a couple of months.   BP 130/71 mmHg  Pulse 71  Temp(Src) 98.1 F (36.7 C)  Ht 5' (1.524 m)  Wt 126 lb 11.2 oz (57.471 kg)  BMI 24.74 kg/m2   Wt Readings from Last 3 Encounters:  12/22/15 126 lb 11.2 oz (57.471 kg)  05/28/15 141 lb 4.8 oz (64.093 kg)  05/24/15 142 lb (64.411 kg)

## 2015-12-22 NOTE — Telephone Encounter (Signed)
I have just spoken with [name] at Dr. [name] [name]'s office. I have asked [name] to discuss with Dr. Rex Kras about scheduling a CT of [name] chest/abdomen/ pelvis due to a recent 20 lb weight loss. [name] did not want to have the CT till after [name] MRI which is scheduled for next week. [name] reported that she would discuss with Dr. Rex Kras and hopefully have the CT done in the next couple of weeks, and they would fax the results to this office.

## 2015-12-22 NOTE — Progress Notes (Addendum)
Radiation Oncology         [phone] ________________________________  Name: [name] MRN: [id number]  Date: 12/22/2015  DOB: 05-16-1934  Follow-Up Visit Note  Outpatient  CC: Gennette Pac, MD  Hulan Fess, MD    ICD-9-CM ICD-10-CM   1. Squamous cell carcinoma of anal margin 173.52 C44.520      Diagnosis and Prior Radiotherapy:   Squamous Cell Carcinoma of perianal skin - cT2N0M0, Stage II  Indication for treatment: Curative, post-operative  Radiation treatment dates: 04/08/2014-05/14/2014  Site/dose: Right perianal skin / 45 Gy in 25 fractions   Narrative:  The patient returns today for routine follow-up.    She reports pain of 10/10 to her bilateral shoulder blades and upper spine. She relates this pain to Prolia which she took on 09/09/15. She begain to experience this pain about Christmas time. She takes oxycodone occasionally, some days she won't take any and other days she will take 1 or 2. She does have a MRI scheduled 12/29/15 to her neck. She also reports numbness in her right hand. She denies pain in her anal area. She reports recent difficulties with constipation which has been relieved with prunes. She reports bowel movements every other day. She denies blood in stool. She reports she is eating well, however she has had a twenty pound weight loss recently over a couple of months.   She reports she is up to date on colonoscopy and mammograms.  BP 130/71 mmHg  Pulse 71  Temp(Src) 98.1 F (36.7 C)  Ht 5' (1.524 m)  Wt 126 lb 11.2 oz (57.471 kg)  BMI 24.74 kg/m2   Wt Readings from Last 3 Encounters:  12/22/15 126 lb 11.2 oz (57.471 kg)  05/28/15 141 lb 4.8 oz (64.093 kg)  05/24/15 142 lb (64.411 kg)   ALLERGIES:  has No Known Allergies.  Meds: Current Outpatient Prescriptions  Medication Sig Dispense Refill  . ALPRAZolam (XANAX) 0.25 MG tablet Take 0.25 mg by mouth 2 (two) times daily as needed for anxiety. For anxiety    . aspirin EC 81 MG tablet  Take 81 mg by mouth at bedtime.    . Cholecalciferol (VITAMIN D) 2000 UNITS CAPS Take 1 capsule by mouth at bedtime.    . docusate sodium (COLACE) 100 MG capsule Take 100 mg by mouth daily as needed.    . hydrochlorothiazide (HYDRODIURIL) 25 MG tablet Take 12.5 mg by mouth daily after breakfast.    . losartan (COZAAR) 50 MG tablet Take 50 mg by mouth daily.    Marland Kitchen lovastatin (MEVACOR) 40 MG tablet Take 40 mg by mouth every evening.    . Multiple Vitamins-Minerals (OCUVITE PRESERVISION PO) Take by mouth 2 (two) times daily.    Marland Kitchen oxyCODONE (OXY IR/ROXICODONE) 5 MG immediate release tablet Take 1-2 tablets (5-10 mg total) by mouth every 6 (six) hours as needed (Pain). 30 tablet 0  . venlafaxine (EFFEXOR-XR) 75 MG 24 hr capsule Take 75 mg by mouth daily after breakfast.     No current facility-administered medications for this encounter.    Physical Findings:  Filed Vitals:   12/22/15 1141  BP: 130/71  Pulse: 71  Temp: 98.1 F (36.7 C)    Wt Readings from Last 3 Encounters:  12/22/15 126 lb 11.2 oz (57.471 kg)  05/28/15 141 lb 4.8 oz (64.093 kg)  05/24/15 142 lb (64.411 kg)    The patient is in no acute distress. Patient is alert and oriented. Heart - bradycardic, irregular rhythm, and audible  murmur. Lungs CTAB Abd soft, nontender, non distended Hemorrhoids noted on anal exam. No external lesions suggestive of cancer. No lesions felt in the anal canal on digital rectal exam. No lymphadenopathy palpated in the groin.   Ext: no edema   Lab Findings: Lab Results  Component Value Date   WBC 8.9 03/25/2015   HGB 15.9* 05/24/2015   HCT 47.8* 03/25/2015   MCV 95.2 03/25/2015   PLT 190 03/25/2015    Radiographic Findings: No results found.  Impression/Plan:  No evidence of local recurrence Weight loss, unclear etiology. . The patient continues to use tobacco. The patient has been counseled to stop using tobacco due to risk of malignancies from this. I recommended CT of Chest Abd  and Pelvis to work up her weight loss.  She declines this as she feels overwhelmed by procedures and wants to finish her MRI first, to work up the upper back pain that is being managed by her PCP.  She was agreeable to considering CT imaging of CAP through Dr Rex Kras after the MRI.  I tried to call his office but the operators were busy.  I will ask my nurse to call later, and I will cc this note to him.  We will follow up in 6 months. Card given to patient schedule. Of course, I am happy to see her sooner, if needed.  ____________________________________   Eppie Gibson, MD

## 2015-12-29 ENCOUNTER — Ambulatory Visit
Admission: RE | Admit: 2015-12-29 | Discharge: 2015-12-29 | Disposition: A | Discharge status: Home / Self Care | Payer: Medicare Other | Source: Ambulatory Visit | Attending: Family Medicine | Admitting: Family Medicine

## 2015-12-29 DIAGNOSIS — M5412 Radiculopathy, cervical region: Secondary | ICD-10-CM

## 2016-01-19 NOTE — Telephone Encounter (Signed)
error 

## 2016-01-24 ENCOUNTER — Telehealth: Payer: Self-pay

## 2016-01-24 NOTE — Telephone Encounter (Signed)
I received a phone call from Ms. Gest's primary care physician Dr. Hulan Fess today. They reported that they are going to hold off on doing any scans on [name] for now, because she has recently gained weight. I will let Dr. Isidore Moos know that I received this phone call.

## 2016-03-23 ENCOUNTER — Other Ambulatory Visit (HOSPITAL_BASED_OUTPATIENT_CLINIC_OR_DEPARTMENT_OTHER): Payer: Medicare Other

## 2016-03-23 ENCOUNTER — Other Ambulatory Visit: Payer: Self-pay | Admitting: *Deleted

## 2016-03-23 ENCOUNTER — Ambulatory Visit (HOSPITAL_BASED_OUTPATIENT_CLINIC_OR_DEPARTMENT_OTHER): Payer: Medicare Other | Admitting: Oncology

## 2016-03-23 ENCOUNTER — Telehealth: Payer: Self-pay | Admitting: Oncology

## 2016-03-23 VITALS — BP 161/59 | HR 42 | Temp 97.8°F | Resp 18 | Ht 60.0 in | Wt 126.2 lb

## 2016-03-23 DIAGNOSIS — K649 Unspecified hemorrhoids: Secondary | ICD-10-CM

## 2016-03-23 DIAGNOSIS — C44520 Squamous cell carcinoma of anal skin: Secondary | ICD-10-CM | POA: Diagnosis not present

## 2016-03-23 LAB — CBC WITH DIFFERENTIAL/PLATELET
BASO%: 0.5 % (ref 0.0–2.0)
Basophils Absolute: 0 10*3/uL (ref 0.0–0.1)
EOS%: 1.1 % (ref 0.0–7.0)
Eosinophils Absolute: 0.1 10*3/uL (ref 0.0–0.5)
HEMATOCRIT: 47.9 % — AB (ref 34.8–46.6)
HGB: 15.7 g/dL (ref 11.6–15.9)
LYMPH%: 13.3 % — ABNORMAL LOW (ref 14.0–49.7)
MCH: 31.3 pg (ref 25.1–34.0)
MCHC: 32.7 g/dL (ref 31.5–36.0)
MCV: 95.6 fL (ref 79.5–101.0)
MONO#: 0.6 10*3/uL (ref 0.1–0.9)
MONO%: 6.8 % (ref 0.0–14.0)
NEUT#: 6.7 10*3/uL — ABNORMAL HIGH (ref 1.5–6.5)
NEUT%: 78.3 % — AB (ref 38.4–76.8)
Platelets: 177 10*3/uL (ref 145–400)
RBC: 5.01 10*6/uL (ref 3.70–5.45)
RDW: 14.6 % — ABNORMAL HIGH (ref 11.2–14.5)
WBC: 8.6 10*3/uL (ref 3.9–10.3)
lymph#: 1.1 10*3/uL (ref 0.9–3.3)

## 2016-03-23 LAB — COMPREHENSIVE METABOLIC PANEL
ALT: 13 U/L (ref 0–55)
AST: 17 U/L (ref 5–34)
Albumin: 3.4 g/dL — ABNORMAL LOW (ref 3.5–5.0)
Alkaline Phosphatase: 56 U/L (ref 40–150)
Anion Gap: 9 mEq/L (ref 3–11)
BUN: 8.5 mg/dL (ref 7.0–26.0)
CALCIUM: 11.7 mg/dL — AB (ref 8.4–10.4)
CHLORIDE: 103 meq/L (ref 98–109)
CO2: 32 mEq/L — ABNORMAL HIGH (ref 22–29)
Creatinine: 0.8 mg/dL (ref 0.6–1.1)
EGFR: 75 mL/min/{1.73_m2} — ABNORMAL LOW (ref >90)
Glucose: 126 mg/dl (ref 70–140)
POTASSIUM: 2.6 meq/L — AB (ref 3.5–5.1)
Sodium: 145 mEq/L (ref 136–145)
Total Bilirubin: 0.45 mg/dL (ref 0.20–1.20)
Total Protein: 6.8 g/dL (ref 6.4–8.3)

## 2016-03-23 MED ORDER — POTASSIUM CHLORIDE CRYS ER 20 MEQ PO TBCR: 20.0000 meq | EXTENDED_RELEASE_TABLET | Freq: Every day | ORAL | Status: DC

## 2016-03-23 NOTE — Telephone Encounter (Signed)
Gave and printed appt sched and avs for pt for June 2018...the patient will call [facility] to sched appt..she is a known pt of Dr. Grandville Silos

## 2016-03-23 NOTE — Telephone Encounter (Signed)
Per dr Alen Blew, k-dur 20 meq tabs, # 14, e-scribed to patient's pharmacy. Lab results faxed to dr [name] little

## 2016-03-23 NOTE — Progress Notes (Signed)
The results of her electrolytes showed hypokalemia and hypocalcemia. She told me that she have taking Prolia before for presumed osteoporosis. She is also on hydrochlorothiazide. I recommended the potassium supplements for the next 2 weeks and I will fax the results of her electrolytes to her primary care provider for further management.  I do not think her hypercalcemia is related to malignancy and she is asymptomatic at this time.

## 2016-03-23 NOTE — Progress Notes (Signed)
Hematology and Oncology Follow Up Visit  [name] [id number] 1934/04/19 82 y.o. 03/23/2016 10:36 AM [name], MDLittle, [name], MD   Principle Diagnosis: 82 year old woman squamous cell carcinoma of the perianal area diagnosed in April of 2015. She presented with perianal mass measuring 1.5 cm. Clinical stage TII N0.  Prior Therapy:  On 02/06/2014 she underwent an excision of a perianal mass which was done successfully without any complications. The pathology showed squamous cell carcinoma with surgical margins appeared negative for carcinoma. The specimen described to be measuring 3.4 x 2.0 cm but the lesion measuring 2.5 x 2.1 x 0.7 cm. She is status post adjuvant radiation therapy to the pelvic and perianal area between 6/17 and 05/14/2014. She received a total of 45 gray in 25 fractions to the right perianal skin.  Current therapy: Observation and surveillance.  Interim History:  [name] presents today for a followup visit. Since her last visit, she reports few complaints. She has reported recurrent hemorrhoids and pain that is not resolved completely with Preparation H. She reports that she has been dealing with this for the last 3-4 weeks. She did not report any hematochezia or melena. She did not report any constitutional symptoms of fevers or chills or sweats.   She had other complaints including back pain and neck pain as well as peripheral neuropathy associated with it. Cervical spine MRI done in March 2017 showed degenerative disc disease and moderate impingement noted. She had been seen by Dr. Nelva Bush regarding this issue. He'll also reported poor appetite and lost close to 20 pounds in the last year. Her weight have been stable however in the last 2 months.   She has not reported any headaches or blurry vision or syncope. Did not really skin rashes or lesions. She did not report any chest pain or palpitation orthopnea. Does not report any wheezing or shortness of breath.  She continues to be functional and perform activities of daily living without any hindrance to decline. Rest of her review of systems unremarkable.  Medications: I have reviewed the patient's current medications.  Current Outpatient Prescriptions  Medication Sig Dispense Refill  . ALPRAZolam (XANAX) 0.25 MG tablet Take 0.25 mg by mouth 2 (two) times daily as needed for anxiety. For anxiety    . aspirin EC 81 MG tablet Take 81 mg by mouth at bedtime.    . Cholecalciferol (VITAMIN D) 2000 UNITS CAPS Take 1 capsule by mouth at bedtime.    . docusate sodium (COLACE) 100 MG capsule Take 100 mg by mouth daily as needed.    . hydrochlorothiazide (HYDRODIURIL) 25 MG tablet Take 12.5 mg by mouth daily after breakfast.    . losartan (COZAAR) 50 MG tablet Take 50 mg by mouth daily.    Marland Kitchen lovastatin (MEVACOR) 40 MG tablet Take 40 mg by mouth every evening.    . Multiple Vitamins-Minerals (OCUVITE PRESERVISION PO) Take by mouth 2 (two) times daily.    Marland Kitchen oxyCODONE (OXY IR/ROXICODONE) 5 MG immediate release tablet Take 1-2 tablets (5-10 mg total) by mouth every 6 (six) hours as needed (Pain). 30 tablet 0  . venlafaxine (EFFEXOR-XR) 75 MG 24 hr capsule Take 75 mg by mouth daily after breakfast.     No current facility-administered medications for this visit.     Allergies: No Known Allergies  Past Medical History, Surgical history, Social history, and Family History were reviewed and updated.   Physical Exam: Blood pressure 161/59, pulse 42, temperature 97.8 F (36.6 C), temperature source  Oral, resp. rate 18, height 5' (1.524 m), weight 126 lb 3.2 oz (57.244 kg), SpO2 98 %. ECOG: 0 General appearance: Chronically ill-appearing woman without distress. Head: Normocephalic, without obvious abnormality oral ulcers or lesions. Neck: no adenopathy Lymph nodes: Cervical, supraclavicular, and axillary nodes normal. Heart:regular rate and rhythm, S1, S2 normal, no murmur, click, rub or gallop Lung:chest  clear, no wheezing, rales, normal symmetric air entry Abdomin: soft, non-tender, without masses or organomegaly no shifting dullness or ascites. No pelvic adenopathy palpated. EXT:no erythema, induration, or nodules    Lab Results: Lab Results  Component Value Date   WBC 8.6 03/23/2016   HGB 15.7 03/23/2016   HCT 47.9* 03/23/2016   MCV 95.6 03/23/2016   PLT 177 03/23/2016     Chemistry      Component Value Date/Time   NA 140 05/21/2015 1615   NA 142 03/25/2015 0959   K 3.9 05/21/2015 1615   K 3.8 03/25/2015 0959   CL 102 05/21/2015 1615   CO2 28 05/21/2015 1615   CO2 28 03/25/2015 0959   BUN 10 05/21/2015 1615   BUN 14.9 03/25/2015 0959   CREATININE 0.81 05/21/2015 1615   CREATININE 0.7 03/25/2015 0959      Component Value Date/Time   CALCIUM 10.3 05/21/2015 1615   CALCIUM 10.0 03/25/2015 0959   ALKPHOS 58 03/25/2015 0959   ALKPHOS 62 03/05/2013 1627   AST 18 03/25/2015 0959   AST 21 03/05/2013 1627   ALT 14 03/25/2015 0959   ALT 18 03/05/2013 1627   BILITOT 0.48 03/25/2015 0959   BILITOT 0.3 03/05/2013 1627       Impression and Plan:   82 year old woman with the following issues:  1. Squamous cell carcinoma of the perianal area. Clinical stage TII N0. She is status post surgical excision followed by adjuvant radiation therapy. She tolerated it well without any delayed complications.  Laboratory testing and CT scan on 09/22/2014 showed no evidence of relapse disease.   I see no evidence to suggest recurrent disease systemically at this time. Her constitutional symptoms of weight loss is unrelated to malignancy at this time.  2. Age-appropriate cancer screening: She is to up-to-date with mammography and colonoscopy.  3. Hemorrhoids: I recommended follow-up with Dr. Grandville Silos from general surgery for evaluation regarding her recurrent hemorrhoids. It also would be reasonable for anoscopy to ensure no local recurrence of her cancer.  4. Follow-up: Will be in one  year sooner if needed to.   [facility], MD 6/1/201710:36 AM

## 2016-04-19 ENCOUNTER — Ambulatory Visit: Payer: Self-pay | Admitting: General Surgery

## 2016-05-19 IMAGING — CT CT ABD-PELV W/ CM
2 of 4 series · 16 of 46 positions shown, 18 images · IV contrast (OMNIPAQUE)
Comparison: None.

CLINICAL DATA: Squamous cell carcinoma of anal margin.
Hysterectomy. Appendectomy. Cholecystectomy. Low back pain. New
diagnosis.

EXAM:
CT ABDOMEN AND PELVIS WITH CONTRAST
TECHNIQUE: Multidetector CT imaging of the abdomen and pelvis was performed
using the standard protocol following bolus administration of
intravenous contrast.
CONTRAST:  100mL OMNIPAQUE IOHEXOL 300 MG/ML  SOLN

[Series 2: rtn a/p with · axial · 0.74mm/px · z∈[-444,-114]mm · 13 of 74 slices shown, 15 images]
[im 4/74  soft-tissue]
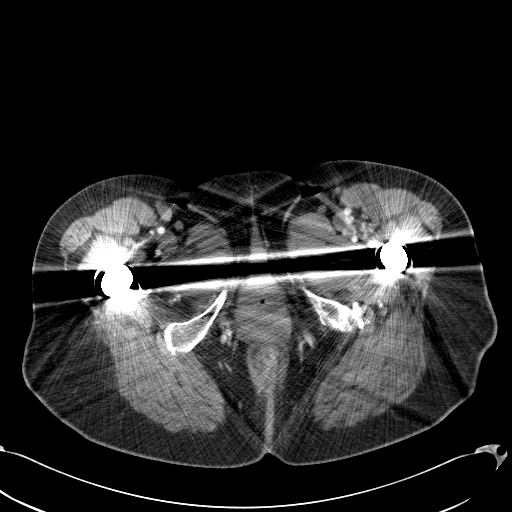
[im 4/74  bone]
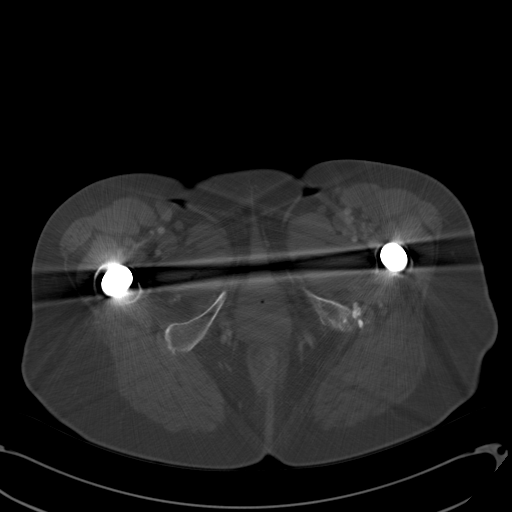
[im 13/74  soft-tissue]
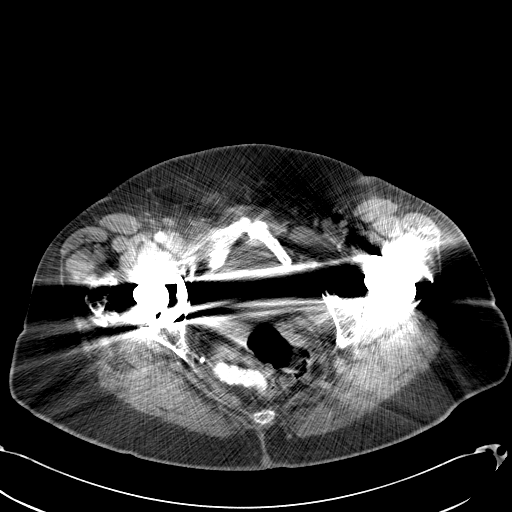
[im 20/74  soft-tissue]
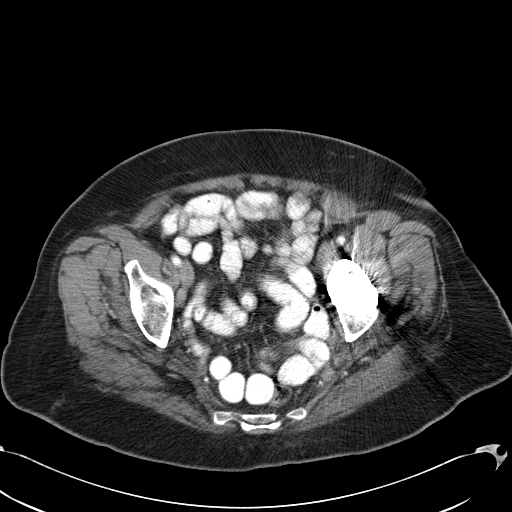
[im 23/74  soft-tissue]
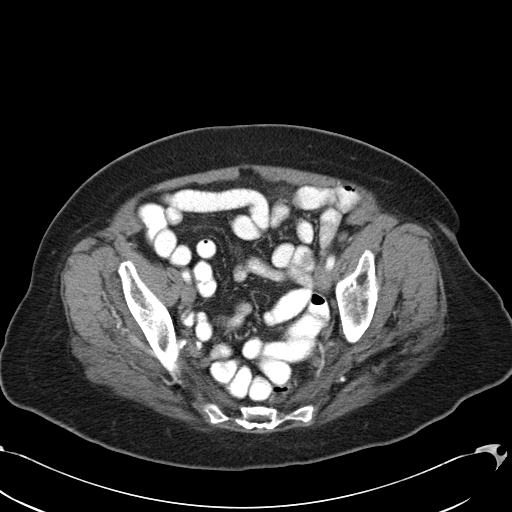
[im 29/74  soft-tissue]
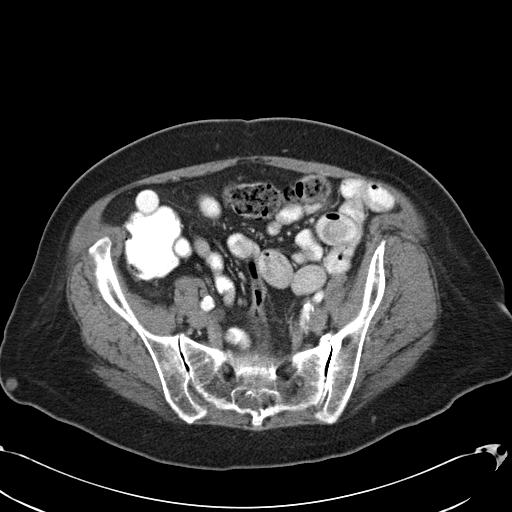
[im 32/74  soft-tissue]
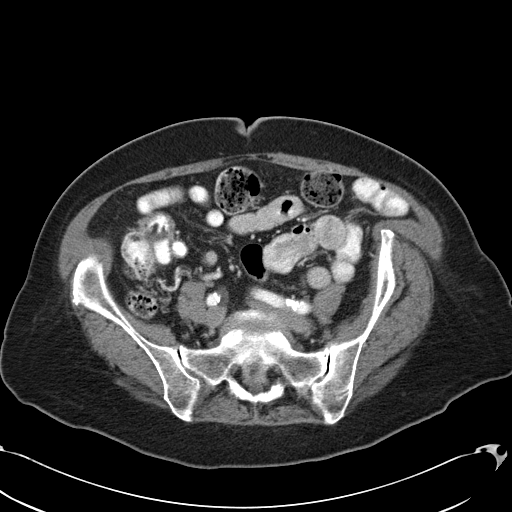
[im 39/74  soft-tissue]
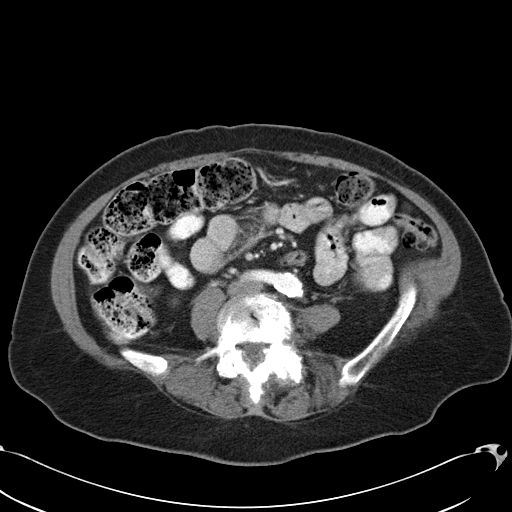
[im 45/74  soft-tissue]
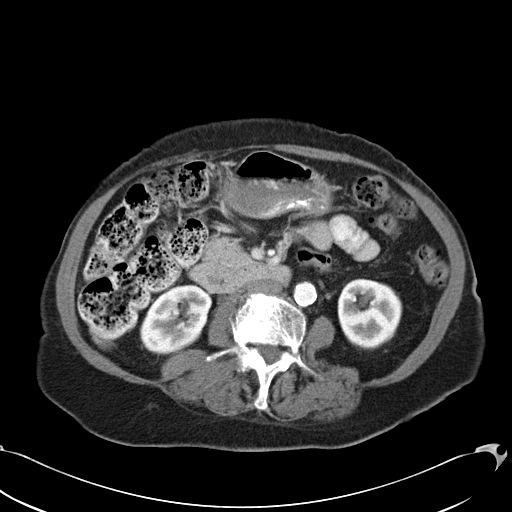
[im 48/74  soft-tissue]
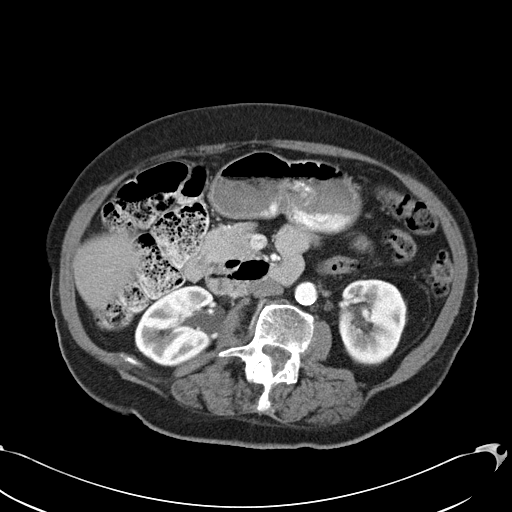
[im 48/74  bone]
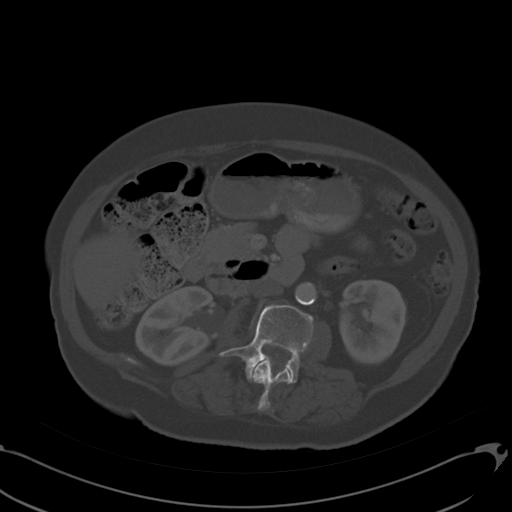
[im 54/74  soft-tissue]
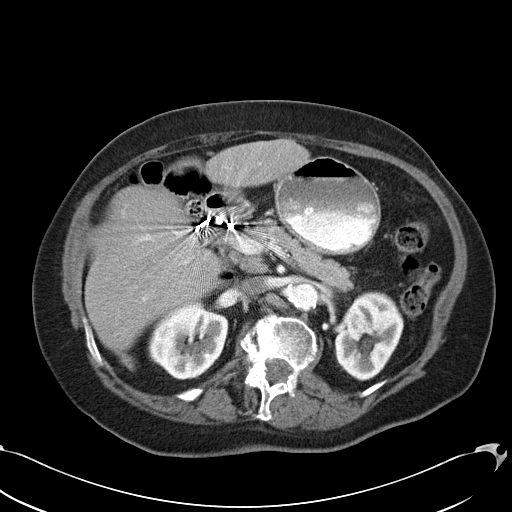
[im 58/74  soft-tissue]
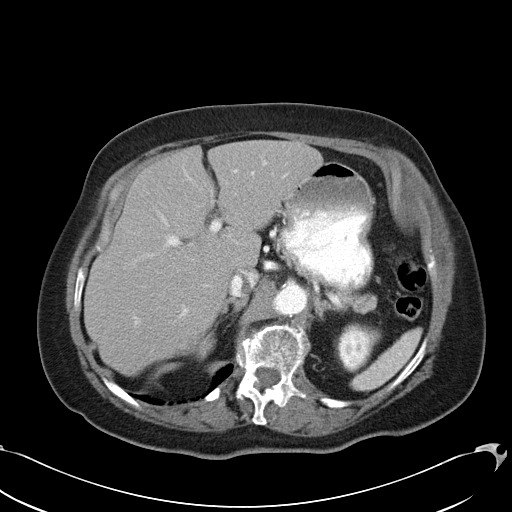
[im 64/74  soft-tissue]
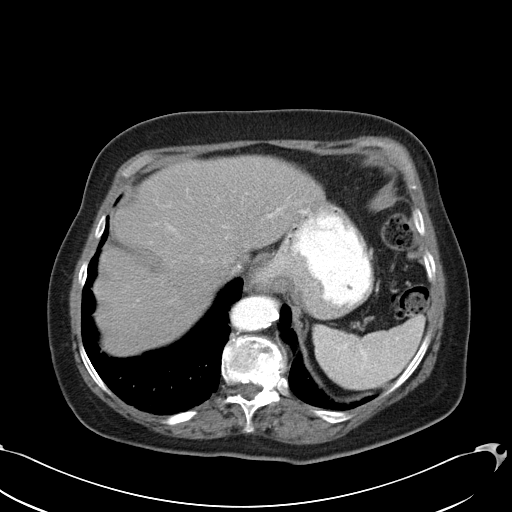
[im 70/74  soft-tissue]
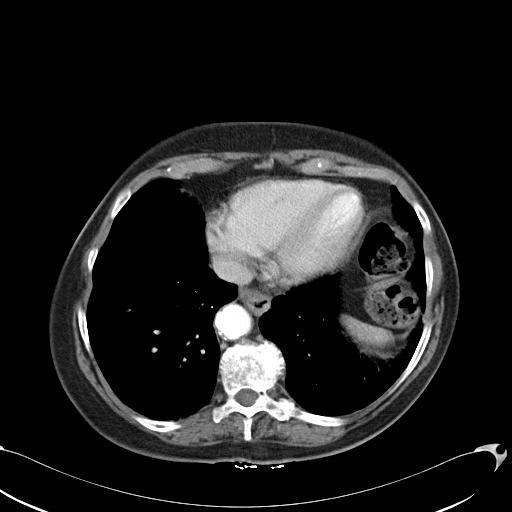

[Series 602: <mpr thick range> · coronal · 0.74mm/px · 3 of 92 slices shown]
[im 31/92  soft-tissue]
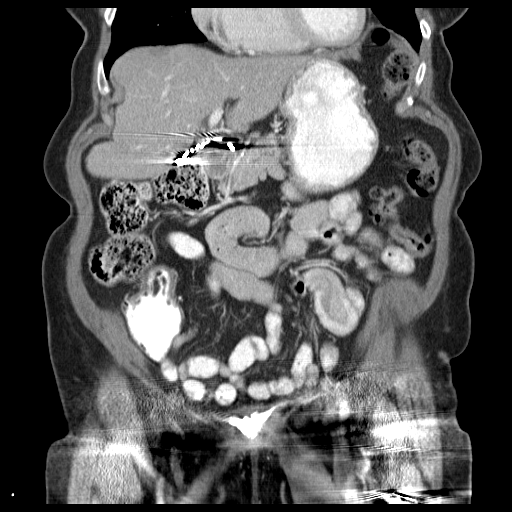
[im 41/92  soft-tissue]
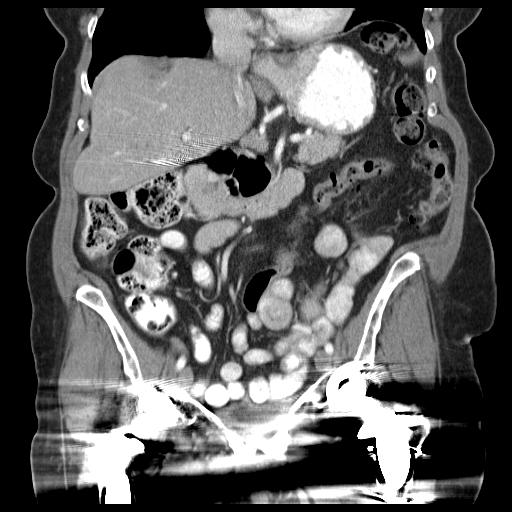
[im 51/92  soft-tissue]
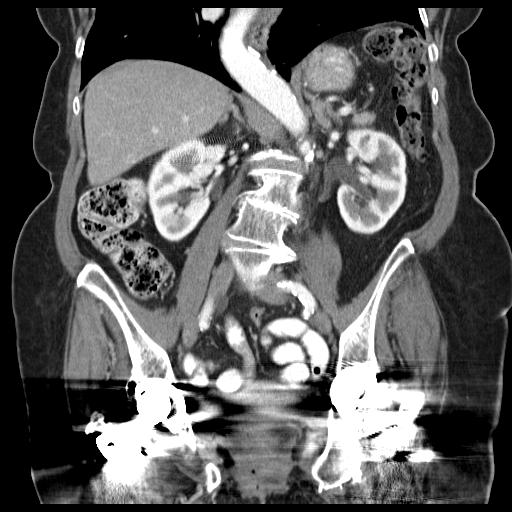

[16 of 46 positions shown; findings below may reference images not displayed]

FINDINGS: Lower Chest: Mild centrilobular emphysema at the lung bases. Mild
cardiomegaly with coronary artery atherosclerosis. A small hiatal
hernia. No pericardial or pleural effusion.

Abdomen/Pelvis: Normal liver, spleen, distal stomach. A large
descending duodenal diverticulum. This is positioned just distal to
the ampulla. Normal pancreas. Cholecystectomy, without biliary
ductal dilatation. Normal adrenal glands and kidneys.

Aortic and branch vessel atherosclerosis. No retroperitoneal or
retrocrural adenopathy.

No anal primary identified. Normal appearance of the ischioanal
fossae. Beam hardening artifact from bilateral hip arthroplasties
moderately degrades evaluation of the pelvis. Scattered colonic
diverticula. Large amount of ascending colonic stool. Normal
terminal ileum and appendix. Normal small bowel without abdominal
ascites. No evidence of omental or peritoneal disease.

No gross pelvic adenopathy. A right inguinal node measures 9 mm, not
pathologic by size criteria. Hysterectomy. Urinary bladder grossly
normal without free pelvic fluid.

Bones/Musculoskeletal: Bilateral hip arthroplasties. Degenerative
changes about the symphysis pubis. Convex left lumbar spine
curvature. Bilateral L5 pars defects. Borderline wedging of the L2
vertebral body.
IMPRESSION: 1. Moderately degraded evaluation of the pelvis secondary to
bilateral hip arthroplasties and resultant beam hardening artifact.
2. Given this factor, the primary lesion is not identified and no
evidence of metastatic disease is seen.
3.  Possible constipation.
4. Small hiatal hernia.
5.  Atherosclerosis, including within the coronary arteries.

## 2016-05-23 ENCOUNTER — Ambulatory Visit: Payer: Medicare Other | Attending: Ophthalmology | Admitting: Occupational Therapy

## 2016-05-23 DIAGNOSIS — H53412 Scotoma involving central area, left eye: Secondary | ICD-10-CM | POA: Diagnosis present

## 2016-05-23 NOTE — Therapy (Signed)
[facility] [address] [place], [place], [address] Phone: [phone]   Fax:  [phone]  Occupational Therapy Treatment  Patient Details  Name: [name] MRN: [id number] Date of Birth: [birth year] Referring Provider: Dr. Oval Linsey  Encounter Date: 05/23/2016      OT End of Session - 05/23/16 1445    Visit Number 1   Number of Visits 1   Authorization Type Medicare   Authorization - Visit Number 1   Authorization - Number of Visits 1   OT Start Time [id number]   OT Stop Time 1402   OT Time Calculation (min) 41 min   Activity Tolerance Patient tolerated treatment well   Behavior During Therapy [facility] for tasks assessed/performed      Past Medical History:  Diagnosis Date  . Anxiety   . Arthritis    knees, hands   . Cancer (HCC)    SQUAMOuS CELL CARCINOMA OF SKIN- PERIANAL   . COPD (chronic obstructive pulmonary disease) ([place])    smoker  . Depression   . Elbow fracture, right 2013  . Hyperlipidemia   . Hypertension   . Osteoporosis   . Pneumonia    hx of   . S/P radiation therapy 04/08/2014-05/14/2014   Right perianal skin / 45 Gy in 25 fractions  . Wears glasses     Past Surgical History:  Procedure Laterality Date  . ABDOMINAL HYSTERECTOMY     TOTAL VAGINAL HYSTERECTOMY  . APPENDECTOMY    . CHOLECYSTECTOMY    . ELBOW FRACTURE SURGERY  2013   rt  . EYE SURGERY     left eye cataract surgery   . HEMORRHOID SURGERY N/A 02/06/2014   Procedure: EXCISION PERIANAL MASS;  Surgeon: Zenovia Jarred, MD;  Location: [facility];  Service: General;  Laterality: N/A;  . HERNIA REPAIR     left inguinal hernia repair   . I&D EXTREMITY Left 03/05/2013   Procedure: Irrigation and Debridement Olecranon Bursa/Left Elbow;  Surgeon: Linna Hoff, MD;  Location: [facility];  Service: Orthopedics;  Laterality: Left;  . JOINT REPLACEMENT     right and left hip replacement   . MASS EXCISION N/A 05/24/2015   Procedure: EXCISION ANAL MASS X 2;  Surgeon: Georganna Skeans, MD;  Location: [facility];  Service: General;  Laterality: N/A;  . OTHER SURGICAL HISTORY     arthroscopic right knee   . OTHER SURGICAL HISTORY     right carpal tunnel  . OTHER SURGICAL HISTORY     right elbow surgery   . OTHER SURGICAL HISTORY     oophorectomy 1958  . TOTAL KNEE ARTHROPLASTY  12/13/2011   Procedure: TOTAL KNEE ARTHROPLASTY;  Surgeon: Tobi Bastos, MD;  Location: [facility];  Service: Orthopedics;  Laterality: Right;    There were no vitals filed for this visit.      Subjective Assessment - 05/23/16 1440    Subjective  Pt with macular degeneration demonstrates increased difficulty with reading.   Pertinent History see Epic   Patient Stated Goals to read easier   Currently in Pain? No/denies  no reports of pain            [facility] OT Assessment - 05/23/16 0001      Assessment   Diagnosis macular degneration   Referring Provider Dr. Oval Linsey   Onset Date 12/28/15     Precautions   Precautions Fall   Precaution Comments low vision  Balance Screen   Has the patient fallen in the past 6 months No   Has the patient had a decrease in activity level because of a fear of falling?  No   Is the patient reluctant to leave their home because of a fear of falling?  No     Home  Environment   Family/patient expects to be discharged to: Private residence   [place] One level   Lives With Alone  daughter lives next door     Prior Function   Level of Independence Independent with basic ADLs   Vocation Retired   Leisure reading     ADL   ADL comments Pt is modified indpendent with all basic ADLs.     IADL   Shopping Shops independently for small purchases   Light Housekeeping Performs light daily tasks such as dishwashing, bed Animal nutritionist financial matters independently (budgets, writes checks, pays rent, bills goes to bank),  collects and keeps track of income     Mobility   Mobility Status Independent     Vision - History   Baseline Vision Wears glasses all the time   Visual History Macular degeneration   Patient Visual Report Central vision impairment     Vision Assessment   Vision Assessment Vision tested   Per MD/OD Report OD LP, OS 20/60   Reading Acuity 20/63   Comment Pt reports that letters have fuzz on the edges  Pt reportsincreased difficulty reading newsprint     Cognition   Overall Cognitive Status Within Functional Limits for tasks assessed   Mini Mental State Exam  26/30  WNL                  OT Treatments/Exercises (OP) - 05/23/16 0001      ADLs   ADL Comments Pt/ daughter were educated regarding eccentric viewing and use of hi marks. Pt was educated in use of 3x stand , and handheld magnifiers and 2x gooseneck. Pt demonstrates ability to read continuous text(Pt demonstrates increased ease with 3x magnification.                OT Education - 05/23/16 1441    Education provided Yes   Education Details eccentric viewing, hi- marks and use of 3x stand and handheld magnifiers   Person(s) Educated Patient;Child(ren)   Methods Explanation;Demonstration;Verbal cues;Handout   Comprehension Verbalized understanding;Returned demonstration                    Plan - 05/23/16 1442    Clinical Impression Statement Pt with macular degeneration presents with visual impairments which impede performance of ADLs and reading ability. Pt can benefit from skilled occupational therapy to maximize safety and indpendence with ADLs/IADLs.   Rehab Potential Good   OT Frequency One time visit   OT Duration 8 weeks   OT Treatment/Interventions Self-care/ADL training;DME and/or AE instruction;Patient/family education;Visual/perceptual remediation/compensation   Plan Pt/ daughter were provided with education on day of eval. Pt demonstrates good understanding of all education and  therefore no goals were set. Pt was provided with information for purchase of AE.   Consulted and Agree with Plan of Care Patient;Family member/caregiver      Patient will benefit from skilled therapeutic intervention in order to improve the following deficits and impairments:  Impaired vision/preception  Visit Diagnosis: Scotoma involving central area, left eye - Plan: Ot plan of care cert/re-cert  G-Codes - 05/23/16 1446    Functional Assessment Tool Used clincical impressions   Functional Limitation Self care   Self Care Current Status [phone]) At least 1 percent but less than 20 percent impaired, limited or restricted   Self Care Goal Status [id number]) At least 1 percent but less than 20 percent impaired, limited or restricted   Self Care Discharge Status [phone]) At least 1 percent but less than 20 percent impaired, limited or restricted      Problem List Patient Active Problem List   Diagnosis Date Noted  . Osteoporosis, unspecified 04/02/2014  . Squamous cell carcinoma of anal margin 03/23/2014  . Squamous cell carcinoma of skin 03/04/2014  . Perianal mass 01/28/2014  . Osteoarthritis of right knee 12/13/2011    RINE,KATHRYN 05/23/2016, 8:24 PM Theone Murdoch, OTR/L [phone] Phone: [phone] 8:24 PM 05/23/16 [facility] [address] [place], [place], [address] Phone: [phone]   Fax:  [phone]  Name: [name] MRN: [id number] Date of Birth: Oct 13, 1934

## 2016-05-24 ENCOUNTER — Encounter (HOSPITAL_BASED_OUTPATIENT_CLINIC_OR_DEPARTMENT_OTHER): Payer: Self-pay | Admitting: *Deleted

## 2016-05-25 ENCOUNTER — Encounter (HOSPITAL_BASED_OUTPATIENT_CLINIC_OR_DEPARTMENT_OTHER)
Admission: RE | Admit: 2016-05-25 | Discharge: 2016-05-25 | Disposition: A | Discharge status: Home / Self Care | Payer: Medicare Other | Source: Ambulatory Visit | Attending: General Surgery | Admitting: General Surgery

## 2016-05-25 ENCOUNTER — Encounter (HOSPITAL_BASED_OUTPATIENT_CLINIC_OR_DEPARTMENT_OTHER): Payer: Self-pay | Admitting: *Deleted

## 2016-05-25 DIAGNOSIS — Z01818 Encounter for other preprocedural examination: Secondary | ICD-10-CM | POA: Insufficient documentation

## 2016-05-25 LAB — BASIC METABOLIC PANEL
ANION GAP: 10 (ref 5–15)
BUN: 11 mg/dL (ref 6–20)
CHLORIDE: 99 mmol/L — AB (ref 101–111)
CO2: 28 mmol/L (ref 22–32)
Calcium: 10.6 mg/dL — ABNORMAL HIGH (ref 8.9–10.3)
Creatinine, Ser: 0.63 mg/dL (ref 0.44–1.00)
GFR calc Af Amer: >60 mL/min (ref >60)
GFR calc non Af Amer: >60 mL/min (ref >60)
Glucose, Bld: 151 mg/dL — ABNORMAL HIGH (ref 65–99)
POTASSIUM: 3 mmol/L — AB (ref 3.5–5.1)
SODIUM: 137 mmol/L (ref 135–145)

## 2016-05-25 NOTE — Progress Notes (Signed)
ekg reviewed by dr Stanton Kidney rudd no further treatment

## 2016-05-26 NOTE — Progress Notes (Signed)
Reviewed with Dr Rodman Comp  Abnormal k+ of 3 states no further treatment needed

## 2016-05-29 ENCOUNTER — Encounter (HOSPITAL_BASED_OUTPATIENT_CLINIC_OR_DEPARTMENT_OTHER): Payer: Self-pay | Admitting: Anesthesiology

## 2016-05-29 ENCOUNTER — Ambulatory Visit (HOSPITAL_BASED_OUTPATIENT_CLINIC_OR_DEPARTMENT_OTHER): Payer: Medicare Other | Admitting: Anesthesiology

## 2016-05-29 ENCOUNTER — Ambulatory Visit (HOSPITAL_BASED_OUTPATIENT_CLINIC_OR_DEPARTMENT_OTHER)
Admission: RE | Admit: 2016-05-29 | Discharge: 2016-05-29 | Disposition: A | Discharge status: Home / Self Care | Payer: Medicare Other | Source: Ambulatory Visit | Attending: General Surgery | Admitting: General Surgery

## 2016-05-29 ENCOUNTER — Encounter (HOSPITAL_BASED_OUTPATIENT_CLINIC_OR_DEPARTMENT_OTHER)
Admission: RE | Disposition: A | Discharge status: Home / Self Care | Payer: Self-pay | Source: Ambulatory Visit | Attending: General Surgery

## 2016-05-29 DIAGNOSIS — Z923 Personal history of irradiation: Secondary | ICD-10-CM | POA: Insufficient documentation

## 2016-05-29 DIAGNOSIS — Z79899 Other long term (current) drug therapy: Secondary | ICD-10-CM | POA: Diagnosis not present

## 2016-05-29 DIAGNOSIS — J449 Chronic obstructive pulmonary disease, unspecified: Secondary | ICD-10-CM | POA: Diagnosis not present

## 2016-05-29 DIAGNOSIS — Z9221 Personal history of antineoplastic chemotherapy: Secondary | ICD-10-CM | POA: Insufficient documentation

## 2016-05-29 DIAGNOSIS — F329 Major depressive disorder, single episode, unspecified: Secondary | ICD-10-CM | POA: Insufficient documentation

## 2016-05-29 DIAGNOSIS — I10 Essential (primary) hypertension: Secondary | ICD-10-CM | POA: Diagnosis not present

## 2016-05-29 DIAGNOSIS — F17200 Nicotine dependence, unspecified, uncomplicated: Secondary | ICD-10-CM | POA: Diagnosis not present

## 2016-05-29 DIAGNOSIS — Z7982 Long term (current) use of aspirin: Secondary | ICD-10-CM | POA: Diagnosis not present

## 2016-05-29 DIAGNOSIS — K644 Residual hemorrhoidal skin tags: Secondary | ICD-10-CM | POA: Insufficient documentation

## 2016-05-29 DIAGNOSIS — K648 Other hemorrhoids: Secondary | ICD-10-CM | POA: Diagnosis present

## 2016-05-29 DIAGNOSIS — Z85048 Personal history of other malignant neoplasm of rectum, rectosigmoid junction, and anus: Secondary | ICD-10-CM | POA: Insufficient documentation

## 2016-05-29 DIAGNOSIS — F419 Anxiety disorder, unspecified: Secondary | ICD-10-CM | POA: Insufficient documentation

## 2016-05-29 HISTORY — PX: RECTAL BIOPSY: SHX2303

## 2016-05-29 HISTORY — PX: EVALUATION UNDER ANESTHESIA WITH HEMORRHOIDECTOMY: SHX5624

## 2016-05-29 SURGERY — BIOPSY, RECTUM
Anesthesia: General | Site: Anus

## 2016-05-29 MED ORDER — FENTANYL CITRATE (PF) 100 MCG/2ML IJ SOLN: 25.0000 ug | INTRAMUSCULAR | Status: DC | PRN

## 2016-05-29 MED ORDER — OXYCODONE HCL 5 MG PO TABS: 5.0000 mg | ORAL_TABLET | ORAL | 0 refills | Status: DC | PRN

## 2016-05-29 MED ORDER — PROPOFOL 10 MG/ML IV BOLUS
INTRAVENOUS | Status: AC
Start: 2016-05-29 — End: 2016-05-29
  Filled 2016-05-29: qty 20

## 2016-05-29 MED ORDER — DEXAMETHASONE SODIUM PHOSPHATE 10 MG/ML IJ SOLN
INTRAMUSCULAR | Status: AC
  Filled 2016-05-29: qty 1

## 2016-05-29 MED ORDER — FENTANYL CITRATE (PF) 100 MCG/2ML IJ SOLN
50.0000 ug | INTRAMUSCULAR | Status: DC | PRN
  Administered 2016-05-29: 50 ug via INTRAVENOUS

## 2016-05-29 MED ORDER — CIPROFLOXACIN IN D5W 400 MG/200ML IV SOLN
400.0000 mg | INTRAVENOUS | Status: AC
  Administered 2016-05-29: 400 mg via INTRAVENOUS

## 2016-05-29 MED ORDER — DEXAMETHASONE SODIUM PHOSPHATE 4 MG/ML IJ SOLN
INTRAMUSCULAR | Status: DC | PRN
  Administered 2016-05-29: 10 mg via INTRAVENOUS

## 2016-05-29 MED ORDER — LIDOCAINE 2% (20 MG/ML) 5 ML SYRINGE
INTRAMUSCULAR | Status: AC
  Filled 2016-05-29: qty 5

## 2016-05-29 MED ORDER — BUPIVACAINE LIPOSOME 1.3 % IJ SUSP
INTRAMUSCULAR | Status: AC
Start: 2016-05-29 — End: 2016-05-29
  Filled 2016-05-29: qty 20

## 2016-05-29 MED ORDER — MIDAZOLAM HCL 2 MG/2ML IJ SOLN: 1.0000 mg | INTRAMUSCULAR | Status: DC | PRN

## 2016-05-29 MED ORDER — PROMETHAZINE HCL 25 MG/ML IJ SOLN: 6.2500 mg | INTRAMUSCULAR | Status: DC | PRN

## 2016-05-29 MED ORDER — METRONIDAZOLE IN NACL 5-0.79 MG/ML-% IV SOLN
INTRAVENOUS | Status: AC
  Filled 2016-05-29: qty 100

## 2016-05-29 MED ORDER — THROMBIN 5000 UNITS EX SOLR
CUTANEOUS | Status: AC
  Filled 2016-05-29: qty 5000

## 2016-05-29 MED ORDER — LIDOCAINE 2% (20 MG/ML) 5 ML SYRINGE
INTRAMUSCULAR | Status: DC | PRN
  Administered 2016-05-29: 50 mg via INTRAVENOUS

## 2016-05-29 MED ORDER — GLYCOPYRROLATE 0.2 MG/ML IJ SOLN: 0.2000 mg | Freq: Once | INTRAMUSCULAR | Status: DC | PRN

## 2016-05-29 MED ORDER — CHLORHEXIDINE GLUCONATE CLOTH 2 % EX PADS: 6.0000 | MEDICATED_PAD | Freq: Once | CUTANEOUS | Status: DC

## 2016-05-29 MED ORDER — SCOPOLAMINE 1 MG/3DAYS TD PT72: 1.0000 | MEDICATED_PATCH | Freq: Once | TRANSDERMAL | Status: DC | PRN

## 2016-05-29 MED ORDER — BUPIVACAINE-EPINEPHRINE 0.5% -1:200000 IJ SOLN
INTRAMUSCULAR | Status: DC | PRN
  Administered 2016-05-29: 5 mL

## 2016-05-29 MED ORDER — OXYMETAZOLINE HCL 0.05 % NA SOLN
NASAL | Status: AC
  Filled 2016-05-29: qty 15

## 2016-05-29 MED ORDER — BUPIVACAINE-EPINEPHRINE (PF) 0.5% -1:200000 IJ SOLN
INTRAMUSCULAR | Status: AC
  Filled 2016-05-29: qty 60

## 2016-05-29 MED ORDER — PROPOFOL 10 MG/ML IV BOLUS
INTRAVENOUS | Status: DC | PRN
  Administered 2016-05-29: 120 mg via INTRAVENOUS

## 2016-05-29 MED ORDER — ONDANSETRON HCL 4 MG/2ML IJ SOLN
INTRAMUSCULAR | Status: AC
Start: 2016-05-29 — End: 2016-05-29
  Filled 2016-05-29: qty 2

## 2016-05-29 MED ORDER — CIPROFLOXACIN IN D5W 400 MG/200ML IV SOLN
INTRAVENOUS | Status: AC
  Filled 2016-05-29: qty 200

## 2016-05-29 MED ORDER — BUPIVACAINE LIPOSOME 1.3 % IJ SUSP
INTRAMUSCULAR | Status: DC | PRN
  Administered 2016-05-29: 20 mL

## 2016-05-29 MED ORDER — LACTATED RINGERS IV SOLN
INTRAVENOUS | Status: DC
  Administered 2016-05-29: 10 mL/h via INTRAVENOUS

## 2016-05-29 MED ORDER — FENTANYL CITRATE (PF) 100 MCG/2ML IJ SOLN
INTRAMUSCULAR | Status: AC
  Filled 2016-05-29: qty 2

## 2016-05-29 MED ORDER — METRONIDAZOLE IN NACL 5-0.79 MG/ML-% IV SOLN
500.0000 mg | INTRAVENOUS | Status: AC
  Administered 2016-05-29: 500 mg via INTRAVENOUS

## 2016-05-29 SURGICAL SUPPLY — 42 items
BLADE SURG 11 STRL SS (BLADE) IMPLANT
BLADE SURG 15 STRL LF DISP TIS (BLADE) ×1 IMPLANT
BLADE SURG 15 STRL SS (BLADE) ×2
BRIEF STRETCH FOR OB PAD LRG (UNDERPADS AND DIAPERS) ×2 IMPLANT
CANISTER SUCT 1200ML W/VALVE (MISCELLANEOUS) ×2 IMPLANT
CLEANER CAUTERY TIP 5X5 PAD (MISCELLANEOUS) IMPLANT
COVER MAYO STAND STRL (DRAPES) ×2 IMPLANT
DECANTER SPIKE VIAL GLASS SM (MISCELLANEOUS) ×2 IMPLANT
DRAPE UTILITY XL STRL (DRAPES) ×1 IMPLANT
DRSG EMULSION OIL 3X3 NADH (GAUZE/BANDAGES/DRESSINGS) IMPLANT
DRSG PAD ABDOMINAL 8X10 ST (GAUZE/BANDAGES/DRESSINGS) ×2 IMPLANT
ELECT REM PT RETURN 9FT ADLT (ELECTROSURGICAL) ×2
ELECTRODE REM PT RTRN 9FT ADLT (ELECTROSURGICAL) ×1 IMPLANT
GAUZE PETROLATUM 1 X8 (GAUZE/BANDAGES/DRESSINGS) IMPLANT
GLOVE BIO SURGEON STRL SZ8 (GLOVE) ×2 IMPLANT
GLOVE BIOGEL PI IND STRL 8 (GLOVE) ×1 IMPLANT
GLOVE BIOGEL PI INDICATOR 8 (GLOVE) ×1
GLOVE SURG SS PI 7.0 STRL IVOR (GLOVE) ×1 IMPLANT
GOWN STRL REUS W/ TWL LRG LVL3 (GOWN DISPOSABLE) ×1 IMPLANT
GOWN STRL REUS W/ TWL XL LVL3 (GOWN DISPOSABLE) ×1 IMPLANT
GOWN STRL REUS W/TWL LRG LVL3 (GOWN DISPOSABLE) ×2
GOWN STRL REUS W/TWL XL LVL3 (GOWN DISPOSABLE) ×2
NEEDLE HYPO 22GX1.5 SAFETY (NEEDLE) ×2 IMPLANT
PACK BASIN DAY SURGERY FS (CUSTOM PROCEDURE TRAY) ×2 IMPLANT
PACK LITHOTOMY IV (CUSTOM PROCEDURE TRAY) ×2 IMPLANT
PAD CLEANER CAUTERY TIP 5X5 (MISCELLANEOUS)
PENCIL BUTTON HOLSTER BLD 10FT (ELECTRODE) ×2 IMPLANT
SHEARS HARMONIC 9CM CVD (BLADE) ×1 IMPLANT
SLEEVE SCD COMPRESS KNEE MED (MISCELLANEOUS) ×2 IMPLANT
SPONGE GAUZE 4X4 12PLY STER LF (GAUZE/BANDAGES/DRESSINGS) ×2 IMPLANT
SPONGE HEMORRHOID 8X3CM (HEMOSTASIS) ×2 IMPLANT
SPONGE SURGIFOAM ABS GEL 100 (HEMOSTASIS) IMPLANT
SURGILUBE 2OZ TUBE FLIPTOP (MISCELLANEOUS) ×2 IMPLANT
SUT CHROMIC 3 0 SH 27 (SUTURE) ×1 IMPLANT
SYR CONTROL 10ML LL (SYRINGE) ×2 IMPLANT
TOWEL OR 17X24 6PK STRL BLUE (TOWEL DISPOSABLE) ×2 IMPLANT
TOWEL OR NON WOVEN STRL DISP B (DISPOSABLE) ×2 IMPLANT
TRAY DSU PREP LF (CUSTOM PROCEDURE TRAY) ×2 IMPLANT
TRAY PROCTOSCOPIC FIBER OPTIC (SET/KITS/TRAYS/PACK) IMPLANT
TUBE CONNECTING 20X1/4 (TUBING) ×2 IMPLANT
UNDERPAD 30X30 (UNDERPADS AND DIAPERS) ×2 IMPLANT
YANKAUER SUCT BULB TIP NO VENT (SUCTIONS) ×2 IMPLANT

## 2016-05-29 NOTE — Transfer of Care (Signed)
Immediate Anesthesia Transfer of Care Note  Patient: [name]  Procedure(s) Performed: Procedure(s): ANAL BIOPSY (N/A) EXAM UNDER ANESTHESIA WITH INTERNAL AND EXTERNAL HEMORRHOIDECTOMY (N/A)  Patient Location: PACU  Anesthesia Type:General  Level of Consciousness: awake, sedated and patient cooperative  Airway & Oxygen Therapy: Patient Spontanous Breathing and Patient connected to face mask oxygen  Post-op Assessment: Report given to RN and Post -op Vital signs reviewed and stable  Post vital signs: Reviewed and stable  Last Vitals:  Vitals:   05/29/16 1229  BP: 134/68  Pulse: 66  Resp: 18  Temp: 36.9 C    Last Pain:  Vitals:   05/29/16 1229  TempSrc: Oral  PainSc: 4       Patients Stated Pain Goal: 2 ([id number])  Complications: No apparent anesthesia complications

## 2016-05-29 NOTE — H&P (Signed)
  [name] 04/19/2016 9:58 AM Location: [facility] Patient #: [id number] DOB: 1934-02-22 Widowed / Language: [name] / Race: White Female   History of Present Illness Denise Neri E. Grandville Silos MD; 04/19/2016 10:31 AM) The patient is a 82 year old female who presents with hemorrhoids. [name] is well-known to me with a history of anal squamous cell carcinoma. This was treated with excision, chemotherapy, and radiation. She is followed by Dr. Alen Blew from oncology as well as Dr. Isidore Moos from radiation oncology. She developed some significant constipation a few weeks ago and had to do manual disimpaction. She occasionally has spotting of blood but she had symptoms similar to previous hemorrhoids associated with the constipation episode. No abdominal pain. No diarrhea.   Allergies Elbert Ewings, CMA; 04/19/2016 9:58 AM) No Known Drug Allergies07/13/2016  Medication History Elbert Ewings, CMA; 04/19/2016 9:58 AM) ALPRAZolam (0.25MG  Tablet, Oral) Active. Hydrochlorothiazide (25MG  Tablet, Oral) Active. Losartan Potassium (50MG  Tablet, Oral) Active. Lovastatin (40MG  Tablet, Oral) Active. Venlafaxine HCl ER (75MG  Capsule ER 24HR, Oral) Active. Aspirin (81MG  Tablet DR, Oral) Active. Colace (100MG  Capsule, Oral) Active. Vitamin D (2000UNIT Capsule, Oral) Active. Potassimin (75MG  Tablet, Oral) Active. Medications Reconciled  Vitals Elbert Ewings CMA; 04/19/2016 9:59 AM) 04/19/2016 9:59 AM Weight: 125.8 lb Height: 59in Body Surface Area: 1.51 m Body Mass Index: 25.41 kg/m  Temp.: 97.60F  Pulse: 68 (Regular)  BP: 126/74 (Sitting, Left Arm, Standard)       Physical Exam Denise Neri E. Grandville Silos MD; 04/19/2016 10:34 AM) General Note: Awake and alert   Integumentary Note: Warm and dry, no rashes   Head and Neck Note: Normocephalic, neck supple   Eye Note: Wears glasses, gaze is disconjugate, conjunctivae is noninjected   Chest and Lung Exam Note:  Clear to auscultation bilaterally, no chest wall tenderness   Cardiovascular Note: Regular rate and rhythm, distal pulses are palpable   Abdomen Note: Soft and nontender, normal bowel sounds, no distention, no masses   Rectal Note: Scarring is visible on the left side of her anal opening but there is no hard areas there is an external hemorrhoid on the right side more anteriorly which is noninflamed. Digital rectal exam revealed some small soft masses internally. Anoscopy revealed these to be small internal hemorrhoids without any hard or firm tissue.   Neurologic Note: Alert, speech fluent, gait steady, moves all extremities well     Assessment & Plan Denise Neri E. Grandville Silos MD; 04/19/2016 10:36 AM) ANAL SQUAMOUS CELL CARCINOMA (C21.0) Impression: In light of her recent symptoms, I have offered examination under anesthesia with anal biopsies, additionally we will remove her small external and small internal hemorrhoids at that time. All tissue will be sent for pathologic evaluation. Procedure, risks, and benefits were discussed in detail with her and she is agreeable. We will schedule in the near future. Current Plans ANOSCOPY, DIAGNOSTIC [id number]) Pt Education - [facility] Free Text Education/Instructions: discussed with patient and provided information. HEMORRHOIDS, INTERNAL, WITH BLEEDING (K64.8) Impression: Please see above EXTERNAL HEMORRHOIDS WITHOUT COMPLICATION (0000000)  Georganna Skeans, MD, MPH, FACS Trauma: [phone] General Surgery: [phone]

## 2016-05-29 NOTE — Interval H&P Note (Signed)
History and Physical Interval Note:  05/29/2016 1:35 PM  [name]  has presented today for surgery, with the diagnosis of History anal squamous cell carcinoma, internal and external hemorrhoids  The various methods of treatment have been discussed with the patient and family. After consideration of risks, benefits and other options for treatment, the patient has consented to  Procedure(s): ANAL BIOPSY (N/A) EXAM UNDER ANESTHESIA WITH INTERNAL AND EXTERNAL HEMORRHOIDECTOMY (N/A) as a surgical intervention .  The patient's history has been reviewed, patient examined, no change in status, stable for surgery.  I have reviewed the patient's chart and labs.  Questions were answered to the patient's satisfaction.     Spencer Cardinal E

## 2016-05-29 NOTE — Discharge Instructions (Signed)

## 2016-05-29 NOTE — Anesthesia Postprocedure Evaluation (Signed)
Anesthesia Post Note  Patient: [name]  Procedure(s) Performed: Procedure(s) (LRB): ANAL BIOPSY (N/A) EXAM UNDER ANESTHESIA WITH INTERNAL AND EXTERNAL HEMORRHOIDECTOMY (N/A)  Patient location during evaluation: PACU Anesthesia Type: General Level of consciousness: awake and alert Pain management: pain level controlled Vital Signs Assessment: post-procedure vital signs reviewed and stable Respiratory status: spontaneous breathing, nonlabored ventilation, respiratory function stable and patient connected to nasal cannula oxygen Cardiovascular status: blood pressure returned to baseline and stable Postop Assessment: no signs of nausea or vomiting Anesthetic complications: no    Last Vitals:  Vitals:   05/29/16 1509 05/29/16 1543  BP:  (!) 157/72  Pulse: 64 70  Resp: 20 16  Temp:  36.5 C    Last Pain:  Vitals:   05/29/16 1530  TempSrc:   PainSc: 0-No pain                 [name] J

## 2016-05-29 NOTE — Anesthesia Procedure Notes (Signed)
Procedure Name: LMA Insertion Date/Time: 05/29/2016 1:57 PM Performed by: Lyndee Leo Pre-anesthesia Checklist: Patient identified, Emergency Drugs available, Suction available and Patient being monitored Patient Re-evaluated:Patient Re-evaluated prior to inductionOxygen Delivery Method: Circle system utilized Preoxygenation: Pre-oxygenation with 100% oxygen Intubation Type: IV induction Ventilation: Mask ventilation without difficulty LMA: LMA inserted LMA Size: 4.0 Number of attempts: 1 Airway Equipment and Method: Bite block Placement Confirmation: positive ETCO2 Tube secured with: Tape Dental Injury: Teeth and Oropharynx as per pre-operative assessment

## 2016-05-29 NOTE — Op Note (Signed)
05/29/2016  2:23 PM  PATIENT:  [name]  82 y.o. female  PRE-OPERATIVE DIAGNOSIS:  History anal squamous cell carcinoma, internal and external hemorrhoids  POST-OPERATIVE DIAGNOSIS:  History anal squamous cell carcinoma, internal and external hemorrhoids  PROCEDURE:  Procedure(s): EXAM UNDER ANESTHESIA ANAL BIOPSY INTERNAL HEMORRHOIDECTOMY 2 COLUMN EXTERNAL HEMORRHOIDECTOMY  SURGEON:  Surgeon(s): Georganna Skeans, MD  ASSISTANTS: none   ANESTHESIA:   local and general  EBL:  No intake/output data recorded.  BLOOD ADMINISTERED:none  DRAINS: none   SPECIMEN:  Excision  DISPOSITION OF SPECIMEN:  PATHOLOGY  COUNTS:  YES  DICTATION: .Dragon Dictation Findings: Small perianal mass at 08:30 position, internal hemorrhoid at the 11:00 position contiguous with external hemorrhoid, internal hemorrhoidectomy 7:00 position.  Procedure in detail: Patient was identified in the preop holding area. Informed consent was obtained. She received intravenous antibiotics. She was brought to the operating room and general anesthesia with laryngeal mask airway was administered by the anesthesia staff. She was placed in lithotomy and the perianal region was prepped and draped in a sterile fashion. Time out procedure was performed. Local anesthetic and Exparel were injected for post-operative pain relief. External anal exam revealed a small mass at the 8:30 position. This was excised sharply and sent to pathology. Further examination under anesthesia revealed no other nodules. Scar tissue was present at the 08:00 position. An internal hemorrhoid contiguous with an external hemorrhoid was at the 11:00 position. A smaller internal hemorrhoid was at the 7:00 position. Attention was first directed to the 11:00 position. This external hemorrhoid was taken down using the harmonic scalpel. Dissection continued removing the internal hemorrhoid as well and cauterizing the blood vessel using the harmonic scalpel.  There was excellent hemostasis. Using a similar technique, the internal hemorrhoid at 7:00 position was also removed using the harmonic scalpel. There was excellent hemostasis there as well. Both were labeled appropriately for position and sent to pathology. There were no other anal abnormalities. Hemostasis was ensured. A thrombin soaked Gelfoam roll was then placed as an endorectal dressing. All counts were correct. She tolerated the procedure well without apparent complication was taken recovery in stable condition. PATIENT DISPOSITION:  PACU - hemodynamically stable.   Delay start of Pharmacological VTE agent (>24hrs) due to surgical blood loss or risk of bleeding:  no  Georganna Skeans, MD, MPH, FACS Pager: [phone]  8/7/20172:23 PM

## 2016-05-29 NOTE — Anesthesia Preprocedure Evaluation (Signed)
Anesthesia Evaluation  Patient identified by MRN, date of birth, ID band Patient awake and Patient unresponsive    Reviewed: Allergy & Precautions, NPO status , Patient's Chart, lab work & pertinent test results  Airway Mallampati: II  TM Distance: >3 FB Neck ROM: Full    Dental no notable dental hx.    Pulmonary pneumonia, COPD, Current Smoker,    Pulmonary exam normal breath sounds clear to auscultation       Cardiovascular hypertension, Normal cardiovascular exam Rhythm:Regular Rate:Normal     Neuro/Psych PSYCHIATRIC DISORDERS Anxiety Depression negative neurological ROS     GI/Hepatic negative GI ROS, Neg liver ROS,   Endo/Other  negative endocrine ROS  Renal/GU negative Renal ROS  negative genitourinary   Musculoskeletal  (+) Arthritis ,   Abdominal   Peds negative pediatric ROS (+)  Hematology negative hematology ROS (+)   Anesthesia Other Findings   Reproductive/Obstetrics negative OB ROS                             Anesthesia Physical Anesthesia Plan  ASA: III  Anesthesia Plan: General   Post-op Pain Management:    Induction: Intravenous  Airway Management Planned: LMA  Additional Equipment:   Intra-op Plan:   Post-operative Plan: Extubation in OR  Informed Consent: I have reviewed the patients History and Physical, chart, labs and discussed the procedure including the risks, benefits and alternatives for the proposed anesthesia with the patient or authorized representative who has indicated his/her understanding and acceptance.   Dental advisory given  Plan Discussed with: CRNA  Anesthesia Plan Comments: (LMA 4 last time)        Anesthesia Quick Evaluation

## 2016-05-31 ENCOUNTER — Encounter (HOSPITAL_BASED_OUTPATIENT_CLINIC_OR_DEPARTMENT_OTHER): Payer: Self-pay | Admitting: General Surgery

## 2016-06-28 ENCOUNTER — Encounter: Payer: Self-pay | Admitting: Radiation Oncology

## 2016-06-28 ENCOUNTER — Ambulatory Visit
Admission: RE | Admit: 2016-06-28 | Discharge: 2016-06-28 | Disposition: A | Discharge status: Home / Self Care | Payer: Medicare Other | Source: Ambulatory Visit | Attending: Radiation Oncology | Admitting: Radiation Oncology

## 2016-06-28 DIAGNOSIS — C44520 Squamous cell carcinoma of anal skin: Secondary | ICD-10-CM | POA: Diagnosis present

## 2016-06-28 NOTE — Progress Notes (Signed)
Radiation Oncology         [phone] ________________________________  Name: [name] MRN: [id number]  Date: 06/28/2016  DOB: 1934/05/21  Follow-Up Visit Note  Outpatient  CC: Denise Pac, MD  Denise Fess, MD    ICD-9-CM ICD-10-CM   1. Squamous cell carcinoma of anal margin 173.52 C44.520      Diagnosis and Prior Radiotherapy:   Squamous Cell Carcinoma of perianal skin - cT2N0M0, Stage II  Indication for treatment: Curative, post-operative  Radiation treatment dates: 04/08/2014-05/14/2014  Site/dose: Right perianal skin / 45 Gy in 25 fractions   Narrative:  The patient returns today for routine follow-up. [name] presents for follow up of radiation to her right perianal skin completed 05/14/14. She denies pain except on right shoulder 7/10.  She reports a recent biopsy of her rectal area on 05/29/16 ordered by Dr. Alen Blew which she reports was negative. She reports normal bowel movements daily to every other day with no visible blood. She reports he is eating well. She has no other concerns at this time. The patients weight has fluctuated between 57 and 64 kg over the past 6 months. Her weight is stable as of her last appointment in March 2017.  BP 120/69   Pulse 68   Temp 98.2 F (36.8 C)   Ht 5' (1.524 m)   Wt 126 lb 4.8 oz (57.3 kg)   SpO2 96% Comment: room air  BMI 24.67 kg/m    Wt Readings from Last 3 Encounters:  06/28/16 126 lb 4.8 oz (57.3 kg)  05/29/16 117 lb 3.2 oz (53.2 kg)  03/23/16 126 lb 3.2 oz (57.2 kg)   ALLERGIES:  is allergic to prolia [denosumab].  Meds: Current Outpatient Prescriptions  Medication Sig Dispense Refill  . ALPRAZolam (XANAX) 0.25 MG tablet Take 0.25 mg by mouth 2 (two) times daily as needed for anxiety. For anxiety    . aspirin EC 81 MG tablet Take 81 mg by mouth at bedtime.    . Cholecalciferol (VITAMIN D) 2000 UNITS CAPS Take 1 capsule by mouth at bedtime.    . docusate sodium (COLACE) 100 MG capsule Take 100 mg by  mouth daily as needed.    . hydrochlorothiazide (HYDRODIURIL) 25 MG tablet Take 12.5 mg by mouth daily after breakfast.    . losartan (COZAAR) 50 MG tablet Take 50 mg by mouth daily.    Marland Kitchen lovastatin (MEVACOR) 40 MG tablet Take 40 mg by mouth every evening.    . Multiple Vitamins-Minerals (OCUVITE PRESERVISION PO) Take by mouth 2 (two) times daily.    Marland Kitchen oxyCODONE (OXY IR/ROXICODONE) 5 MG immediate release tablet Take 1-2 tablets (5-10 mg total) by mouth every 4 (four) hours as needed for moderate pain or severe pain (Pain). 30 tablet 0  . venlafaxine (EFFEXOR-XR) 75 MG 24 hr capsule Take 75 mg by mouth daily after breakfast.     No current facility-administered medications for this encounter.     Physical Findings:  Vitals:   06/28/16 1057  BP: 120/69  Pulse: 68  Temp: 98.2 F (36.8 C)    Wt Readings from Last 3 Encounters:  06/28/16 126 lb 4.8 oz (57.3 kg)  05/29/16 117 lb 3.2 oz (53.2 kg)  03/23/16 126 lb 3.2 oz (57.2 kg)    Neck: no palpable cervical or supraclavicular lesions Heart: bradycardic with a systolic murmur and skipped beats Chest: clear to auscultation bilaterally Abdomen: soft, non-tender, non-distended  GI: no palpable groin masses, a mass at the anterior  portion of the anus that appears consistent with a hemorrhoid. With exception to feeling a mass consistent with an internal hemorrhoid there are no palpable anal lesions in digital rectal exam. I am not able to appreciate any distal rectal lesions either.  Lab Findings: Lab Results  Component Value Date   WBC 8.6 03/23/2016   HGB 15.7 03/23/2016   HCT 47.9 (H) 03/23/2016   MCV 95.6 03/23/2016   PLT 177 03/23/2016    Radiographic Findings: No results found.  Impression/Plan:   No evidence of disease. Follow up Dec 2018 She will see med/onc 6 mo prior.  Card given for f/u scheduling.  She is seeing her surgeon PRN..  ____________________________________   Eppie Gibson, MD This document serves as a  record of services personally performed by Eppie Gibson, MD. It was created on her behalf by Bethann Humble, a trained medical scribe. The creation of this record is based on the scribe's personal observations and the provider's statements to them. This document has been checked and approved by the attending provider.

## 2016-06-28 NOTE — Progress Notes (Signed)
[name] presents for follow up of radiation to her Right perianal skin completed 05/14/14. She denies pain except does rate her pain a 7/10 in her Left Shoulder. She reports a recent biopsy of her rectal area on 05/29/16 ordered by Dr. Alen Blew which she reports was negative. She reports normal bowel movements daily to every other day with no visible blood. She reports she is eating well. She has no other concerns at this time.   BP 120/69   Pulse 68   Temp 98.2 F (36.8 C)   Ht 5' (1.524 m)   Wt 126 lb 4.8 oz (57.3 kg)   SpO2 96% Comment: room air  BMI 24.67 kg/m    Wt Readings from Last 3 Encounters:  06/28/16 126 lb 4.8 oz (57.3 kg)  05/29/16 117 lb 3.2 oz (53.2 kg)  03/23/16 126 lb 3.2 oz (57.2 kg)

## 2016-07-20 ENCOUNTER — Other Ambulatory Visit: Payer: Self-pay | Admitting: Family Medicine

## 2016-07-20 DIAGNOSIS — R634 Abnormal weight loss: Secondary | ICD-10-CM

## 2016-07-20 DIAGNOSIS — Z85828 Personal history of other malignant neoplasm of skin: Secondary | ICD-10-CM

## 2016-07-26 ENCOUNTER — Ambulatory Visit
Admission: RE | Admit: 2016-07-26 | Discharge: 2016-07-26 | Disposition: A | Discharge status: Home / Self Care | Payer: Medicare Other | Source: Ambulatory Visit | Attending: Family Medicine | Admitting: Family Medicine

## 2016-07-26 DIAGNOSIS — R634 Abnormal weight loss: Secondary | ICD-10-CM

## 2016-07-26 DIAGNOSIS — Z85828 Personal history of other malignant neoplasm of skin: Secondary | ICD-10-CM

## 2016-07-26 MED ORDER — IOPAMIDOL (ISOVUE-300) INJECTION 61%
100.0000 mL | Freq: Once | INTRAVENOUS | Status: AC | PRN
  Administered 2016-07-26: 100 mL via INTRAVENOUS

## 2017-03-07 ENCOUNTER — Encounter: Payer: Self-pay | Admitting: Gynecology

## 2017-03-28 ENCOUNTER — Other Ambulatory Visit: Payer: Self-pay | Admitting: Oncology

## 2017-03-28 DIAGNOSIS — C4492 Squamous cell carcinoma of skin, unspecified: Secondary | ICD-10-CM

## 2017-03-29 ENCOUNTER — Ambulatory Visit (HOSPITAL_BASED_OUTPATIENT_CLINIC_OR_DEPARTMENT_OTHER): Payer: Medicare Other | Admitting: Oncology

## 2017-03-29 ENCOUNTER — Other Ambulatory Visit (HOSPITAL_BASED_OUTPATIENT_CLINIC_OR_DEPARTMENT_OTHER): Payer: Medicare Other

## 2017-03-29 ENCOUNTER — Telehealth: Payer: Self-pay | Admitting: *Deleted

## 2017-03-29 VITALS — BP 126/61 | HR 64 | Temp 98.2°F | Resp 17 | Ht 60.0 in | Wt 130.2 lb

## 2017-03-29 DIAGNOSIS — Z85828 Personal history of other malignant neoplasm of skin: Secondary | ICD-10-CM | POA: Diagnosis not present

## 2017-03-29 DIAGNOSIS — C4492 Squamous cell carcinoma of skin, unspecified: Secondary | ICD-10-CM

## 2017-03-29 DIAGNOSIS — K649 Unspecified hemorrhoids: Secondary | ICD-10-CM | POA: Diagnosis not present

## 2017-03-29 DIAGNOSIS — E876 Hypokalemia: Secondary | ICD-10-CM | POA: Diagnosis not present

## 2017-03-29 LAB — COMPREHENSIVE METABOLIC PANEL
ALT: 20 U/L (ref 0–55)
AST: 28 U/L (ref 5–34)
Albumin: 3.2 g/dL — ABNORMAL LOW (ref 3.5–5.0)
Alkaline Phosphatase: 69 U/L (ref 40–150)
Anion Gap: 9 mEq/L (ref 3–11)
BILIRUBIN TOTAL: 0.31 mg/dL (ref 0.20–1.20)
BUN: 9.8 mg/dL (ref 7.0–26.0)
CO2: 28 meq/L (ref 22–29)
Calcium: 9.7 mg/dL (ref 8.4–10.4)
Chloride: 105 mEq/L (ref 98–109)
Creatinine: 0.8 mg/dL (ref 0.6–1.1)
EGFR: 75 mL/min/{1.73_m2} — AB (ref >90)
GLUCOSE: 119 mg/dL (ref 70–140)
POTASSIUM: 3.2 meq/L — AB (ref 3.5–5.1)
SODIUM: 141 meq/L (ref 136–145)
TOTAL PROTEIN: 6.1 g/dL — AB (ref 6.4–8.3)

## 2017-03-29 LAB — CBC WITH DIFFERENTIAL/PLATELET
BASO%: 0.8 % (ref 0.0–2.0)
Basophils Absolute: 0 10*3/uL (ref 0.0–0.1)
EOS%: 0.1 % (ref 0.0–7.0)
Eosinophils Absolute: 0 10*3/uL (ref 0.0–0.5)
HEMATOCRIT: 40 % (ref 34.8–46.6)
HEMOGLOBIN: 12.9 g/dL (ref 11.6–15.9)
LYMPH#: 0.4 10*3/uL — AB (ref 0.9–3.3)
LYMPH%: 7.3 % — ABNORMAL LOW (ref 14.0–49.7)
MCH: 28.4 pg (ref 25.1–34.0)
MCHC: 32.3 g/dL (ref 31.5–36.0)
MCV: 87.9 fL (ref 79.5–101.0)
MONO#: 0.4 10*3/uL (ref 0.1–0.9)
MONO%: 7.8 % (ref 0.0–14.0)
NEUT%: 84 % — ABNORMAL HIGH (ref 38.4–76.8)
NEUTROS ABS: 4.5 10*3/uL (ref 1.5–6.5)
Platelets: 160 10*3/uL (ref 145–400)
RBC: 4.55 10*6/uL (ref 3.70–5.45)
RDW: 17.1 % — AB (ref 11.2–14.5)
WBC: 5.4 10*3/uL (ref 3.9–10.3)

## 2017-03-29 NOTE — Progress Notes (Signed)
Hematology and Oncology Follow Up Visit  [name] [id number] 11-29-1933 83 y.o. 03/29/2017 10:12 AM Rex Kras, [name] MDLittle, [name], MD   Principle Diagnosis: 82 year old woman squamous cell carcinoma of the perianal area diagnosed in April of 2015. She presented with perianal mass measuring 1.5 cm. Clinical stage TII N0.  Prior Therapy:  On 02/06/2014 she underwent an excision of a perianal mass which was done successfully without any complications. The pathology showed squamous cell carcinoma with surgical margins appeared negative for carcinoma. The specimen described to be measuring 3.4 x 2.0 cm but the lesion measuring 2.5 x 2.1 x 0.7 cm. She is status post adjuvant radiation therapy to the pelvic and perianal area between 6/17 and 05/14/2014. She received a total of 45 gray in 25 fractions to the right perianal skin.  Current therapy: Observation and surveillance.  Interim History:  [name] presents today for a followup visit. Since her last visit, she reports no major changes in her health. She does report intermittent back discomfort as well as leg discomfort attributed to arthritis. Despite that pain, she is able to ambulate without any major difficulties. She denied any falls or syncope. She denied any neurological deficits.   She continues to follow with Dr. Grandville Silos and underwent an anoscopy and a biopsy under anesthesia in August 2017 without any evidence of recurrent disease. She also had imaging studies in October 2017 which showed no evidence of recurrent malignancy.   She has not reported any headaches or blurry vision or syncope. Did not really skin rashes or lesions. She did not report any chest pain or palpitation orthopnea. Does not report any wheezing or shortness of breath. She continues to be functional and perform activities of daily living without any hindrance to decline. Rest of her review of systems unremarkable.  Medications: I have reviewed the patient's  current medications.  Current Outpatient Prescriptions  Medication Sig Dispense Refill  . ALPRAZolam (XANAX) 0.25 MG tablet Take 0.25 mg by mouth 2 (two) times daily as needed for anxiety. For anxiety    . aspirin EC 81 MG tablet Take 81 mg by mouth at bedtime.    . Cholecalciferol (VITAMIN D) 2000 UNITS CAPS Take 1 capsule by mouth at bedtime.    . docusate sodium (COLACE) 100 MG capsule Take 100 mg by mouth daily as needed.    . hydrochlorothiazide (HYDRODIURIL) 25 MG tablet Take 12.5 mg by mouth daily after breakfast.    . KLOR-CON 10 10 MEQ tablet 1 TABLET WITH FOOD ONCE A DAY ORALLY 30 DAY(S)  12  . losartan (COZAAR) 50 MG tablet Take 50 mg by mouth daily.    Marland Kitchen lovastatin (MEVACOR) 40 MG tablet Take 40 mg by mouth every evening.    . Multiple Vitamins-Minerals (OCUVITE PRESERVISION PO) Take by mouth 2 (two) times daily.    Marland Kitchen oxyCODONE (OXY IR/ROXICODONE) 5 MG immediate release tablet Take 1-2 tablets (5-10 mg total) by mouth every 4 (four) hours as needed for moderate pain or severe pain (Pain). 30 tablet 0  . venlafaxine (EFFEXOR-XR) 75 MG 24 hr capsule Take 75 mg by mouth daily after breakfast.     No current facility-administered medications for this visit.      Allergies:  Allergies  Allergen Reactions  . Prolia [Denosumab] Other (See Comments)    Severe pain, could not fuction    Past Medical History, Surgical history, Social history, and Family History were reviewed and updated.   Physical Exam: Blood pressure 126/61, pulse 64,  temperature 98.2 F (36.8 C), temperature source Oral, resp. rate 17, height 5' (1.524 m), weight 130 lb 3.2 oz (59.1 kg), SpO2 98 %. ECOG: 1 General appearance: Alert, awake woman who appeared without distress. Head: Normocephalic, without obvious abnormality no oral thrush or ulcers. Neck: no adenopathy Lymph nodes: Cervical, supraclavicular, and axillary nodes normal. Heart:regular rate and rhythm, S1, S2 normal, no murmur, click, rub or  gallop Lung:chest clear, no wheezing, rales, normal symmetric air entry Abdomin: soft, non-tender, without masses or organomegaly no rebound or guarding. No pelvic adenopathy palpated. EXT:no erythema, induration, or nodules    Lab Results: Lab Results  Component Value Date   WBC 5.4 03/29/2017   HGB 12.9 03/29/2017   HCT 40.0 03/29/2017   MCV 87.9 03/29/2017   PLT 160 03/29/2017     Chemistry      Component Value Date/Time   NA 137 05/25/2016 1530   NA 145 03/23/2016 1008   K 3.0 (L) 05/25/2016 1530   K 2.6 (LL) 03/23/2016 1008   CL 99 (L) 05/25/2016 1530   CO2 28 05/25/2016 1530   CO2 32 (H) 03/23/2016 1008   BUN 11 05/25/2016 1530   BUN 8.5 03/23/2016 1008   CREATININE 0.63 05/25/2016 1530   CREATININE 0.8 03/23/2016 1008      Component Value Date/Time   CALCIUM 10.6 (H) 05/25/2016 1530   CALCIUM 11.7 (H) 03/23/2016 1008   ALKPHOS 56 03/23/2016 1008   AST 17 03/23/2016 1008   ALT 13 03/23/2016 1008   BILITOT 0.45 03/23/2016 1008       Impression and Plan:   83 year old woman with the following issues:  1. Squamous cell carcinoma of the perianal area. Clinical stage TII N0. She is status post surgical excision followed by adjuvant radiation therapy. She tolerated it well without any delayed complications.  Laboratory testing and CT scan on 09/22/2014 showed no evidence of relapse disease.   I see no evidence to suggest recurrent disease systemically at this time. CT scan October 2017 showed no evidence of recurrent disease.  The plan is to continue with active surveillance with routine examination by Dr. Grandville Silos.  2. Age-appropriate cancer screening: She is to up-to-date with mammography and colonoscopy.  3. Hemorrhoids: She is status post hemorrhoid surgery in August 2017.  4. Hypokalemia: She is currently on potassium supplements and her potassium has been repeated today.  5. Follow-up: I'm happy to see her in the future for medical oncology standpoint  as needed. No routine follow-up will be needed at this time unless disease recurrence is noted.  [facility], MD 6/7/201810:12 AM

## 2017-03-29 NOTE — Telephone Encounter (Signed)
Lm for patient and daughter leanne to call me re: labs

## 2017-03-29 NOTE — Telephone Encounter (Signed)
Spoke with daughter leanne, let her know that her mother's potassium is improving, but still low, continue with daily potassium.

## 2017-03-29 NOTE — Telephone Encounter (Signed)
-----   Message from Wyatt Portela, MD sent at 03/29/2017 10:58 AM EDT ----- Please let her know her K is better but still low. She needs to continue on K

## 2017-10-03 ENCOUNTER — Ambulatory Visit: Payer: Medicare Other | Admitting: Radiation Oncology

## 2017-10-05 ENCOUNTER — Encounter: Payer: Self-pay | Admitting: Radiation Oncology

## 2017-10-05 ENCOUNTER — Other Ambulatory Visit: Payer: Self-pay

## 2017-10-05 ENCOUNTER — Ambulatory Visit
Admission: RE | Admit: 2017-10-05 | Discharge: 2017-10-05 | Disposition: A | Discharge status: Home / Self Care | Payer: Medicare Other | Source: Ambulatory Visit | Attending: Radiation Oncology | Admitting: Radiation Oncology

## 2017-10-05 VITALS — BP 139/72 | HR 55 | Temp 98.6°F | Ht 60.0 in | Wt 133.0 lb

## 2017-10-05 DIAGNOSIS — Z923 Personal history of irradiation: Secondary | ICD-10-CM | POA: Diagnosis not present

## 2017-10-05 DIAGNOSIS — K644 Residual hemorrhoidal skin tags: Secondary | ICD-10-CM | POA: Diagnosis not present

## 2017-10-05 DIAGNOSIS — Z85828 Personal history of other malignant neoplasm of skin: Secondary | ICD-10-CM | POA: Diagnosis not present

## 2017-10-05 DIAGNOSIS — Z79899 Other long term (current) drug therapy: Secondary | ICD-10-CM | POA: Diagnosis not present

## 2017-10-05 DIAGNOSIS — Z888 Allergy status to other drugs, medicaments and biological substances status: Secondary | ICD-10-CM | POA: Insufficient documentation

## 2017-10-05 DIAGNOSIS — Z08 Encounter for follow-up examination after completed treatment for malignant neoplasm: Secondary | ICD-10-CM | POA: Insufficient documentation

## 2017-10-05 DIAGNOSIS — M79605 Pain in left leg: Secondary | ICD-10-CM | POA: Diagnosis not present

## 2017-10-05 DIAGNOSIS — C44520 Squamous cell carcinoma of anal skin: Secondary | ICD-10-CM

## 2017-10-05 DIAGNOSIS — F17200 Nicotine dependence, unspecified, uncomplicated: Secondary | ICD-10-CM | POA: Diagnosis not present

## 2017-10-05 NOTE — Progress Notes (Signed)
[name] presents for follow up of radiation completed 05/14/2014 to her Right perianal skin. She reports pain to her Left leg radiating from her back. She reports normal daily bowel movements. She denies blood in her stool. She is eating well. She continues to see Dr. Grandville Silos for routine examinations.   BP 139/72   Pulse (!) 55   Temp 98.6 F (37 C)   Ht 5' (1.524 m)   Wt 133 lb (60.3 kg)   SpO2 97% Comment: room air  BMI 25.97 kg/m    Wt Readings from Last 3 Encounters:  10/05/17 133 lb (60.3 kg)  03/29/17 130 lb 3.2 oz (59.1 kg)  06/28/16 126 lb 4.8 oz (57.3 kg)

## 2017-10-05 NOTE — Progress Notes (Signed)
Radiation Oncology         [phone] ________________________________  Name: SAPIR LAVEY MRN: [id number]  Date: 10/05/2017  DOB: Mar 09, 1934  Follow-Up Visit Note  Outpatient  CC: Hulan Fess, MD  Wyatt Portela, MD  Diagnosis and Prior Radiotherapy:      ICD-10-CM   1. Squamous cell carcinoma of anal margin C44.520    Squamous Cell Carcinoma of perianal skin - cT2N0M0, Stage II   Radiation treatment dates: 04/08/2014 - 05/14/2014  Site/dose: Right perianal skin / 45 Gy in 25 fractions  Narrative:  The patient returns today for routine follow-up of radiation completed 3 years ago to her right perianal skin. She reports normal daily bowel movements. She denies hematochezia. She reports she is eating well.  She has social stressors and continues to smoke; she is not motivated to quit smoking.  On review of systems, she reports pain to her left leg radiating from her back. She states that she takes Tylenol for this.   ALLERGIES:  is allergic to prolia [denosumab].  Meds: Current Outpatient Medications  Medication Sig Dispense Refill  . ALPRAZolam (XANAX) 0.25 MG tablet Take 0.25 mg by mouth 2 (two) times daily as needed for anxiety. For anxiety    . aspirin EC 81 MG tablet Take 81 mg by mouth at bedtime.    . Cholecalciferol (VITAMIN D) 2000 UNITS CAPS Take 1 capsule by mouth at bedtime.    . hydrochlorothiazide (HYDRODIURIL) 25 MG tablet Take 12.5 mg by mouth daily after breakfast.    . KLOR-CON 10 10 MEQ tablet 1 TABLET WITH FOOD ONCE A DAY ORALLY 30 DAY(S)  12  . losartan (COZAAR) 50 MG tablet Take 50 mg by mouth daily.    Marland Kitchen lovastatin (MEVACOR) 40 MG tablet Take 40 mg by mouth every evening.    . Multiple Vitamins-Minerals (OCUVITE PRESERVISION PO) Take by mouth 2 (two) times daily.    Marland Kitchen venlafaxine (EFFEXOR-XR) 75 MG 24 hr capsule Take 75 mg by mouth daily after breakfast.    . docusate sodium (COLACE) 100 MG capsule Take 100 mg by mouth daily as needed.     No  current facility-administered medications for this encounter.       Physical Findings:  Vitals:   10/05/17 1544  BP: 139/72  Pulse: (!) 55  Temp: 98.6 F (37 C)  SpO2: 97%    Wt Readings from Last 3 Encounters:  10/05/17 133 lb (60.3 kg)  03/29/17 130 lb 3.2 oz (59.1 kg)  06/28/16 126 lb 4.8 oz (57.3 kg)   General: Alert and oriented, in no acute distress. Abdomen: Soft, nontender, nondistended, with no rigidity or guarding. Lymphatics: No groin adenopathy.  Rectal Exam: She has some external hemorrhoids but no nodularity in the external anal tissues. No palpable rectal masses on DRE   Lab Findings: Lab Results  Component Value Date   WBC 5.4 03/29/2017   HGB 12.9 03/29/2017   HCT 40.0 03/29/2017   MCV 87.9 03/29/2017   PLT 160 03/29/2017    Radiographic Findings: No results found.  Impression/Plan:  No evidence of disease. She desires to have less medical visits.  I consider her to be cured.  Follow up prn. She knows to call Dr. Grandville Silos if she has any concerns in the future, or here.  Recommend smoking cessation, though she is not currently willing to quit.  I spent 20 minutes face to face with the patient and more than 50% of that time was spent in  counseling and/or coordination of care.   ____________________________________   Eppie Gibson, MD  This document serves as a record of services personally performed by Eppie Gibson, MD. It was created on her behalf by Rae Lips, a trained medical scribe. The creation of this record is based on the scribe's personal observations and the provider's statements to them. This document has been checked and approved by the attending provider.

## 2017-10-09 ENCOUNTER — Encounter: Payer: Self-pay | Admitting: Radiation Oncology

## 2018-01-14 ENCOUNTER — Encounter: Payer: Self-pay | Admitting: Internal Medicine

## 2018-01-21 ENCOUNTER — Encounter: Payer: Self-pay | Admitting: Internal Medicine

## 2018-04-16 ENCOUNTER — Encounter: Payer: Self-pay | Admitting: Internal Medicine

## 2018-04-16 ENCOUNTER — Ambulatory Visit: Payer: Medicare Other | Admitting: Internal Medicine

## 2018-04-16 VITALS — BP 130/70 | HR 59 | Ht 59.0 in | Wt 132.2 lb

## 2018-04-16 DIAGNOSIS — R001 Bradycardia, unspecified: Secondary | ICD-10-CM

## 2018-04-16 DIAGNOSIS — R911 Solitary pulmonary nodule: Secondary | ICD-10-CM

## 2018-04-16 DIAGNOSIS — F17210 Nicotine dependence, cigarettes, uncomplicated: Secondary | ICD-10-CM | POA: Diagnosis not present

## 2018-04-16 DIAGNOSIS — J411 Mucopurulent chronic bronchitis: Secondary | ICD-10-CM | POA: Diagnosis not present

## 2018-04-16 NOTE — Patient Instructions (Addendum)
Please see patient coordinator before you leave today  to schedule CT chest without contrast at a time of your convenience  The key is to stop smoking completely before smoking completely stops you!

## 2018-04-16 NOTE — Assessment & Plan Note (Addendum)
Spirometry 04/16/2018  wnl  -  04/16/2018  Walked RA x 3 laps @ 185 ft each stopped due to  End of study, nl pace, no sob or desat    Advised does not have copd at this point and cough still c/w CB which is very likely entirely related to smoking and should expect this to improve w/in 6 weeks of stopping

## 2018-04-16 NOTE — Assessment & Plan Note (Addendum)
Appears to be artifact on pulse oximetry as no symptoms at all to correlate and ekg ok x for  A few pvc's

## 2018-04-16 NOTE — Progress Notes (Signed)
Subjective:     Patient ID: [name], female   DOB: August 29, 1934     MRN: [id number]  HPI  22 yowf  Active smoker with "cough for years" attributes to sense of pnds year round x decades but no sob and no recent change in color / vol  with h/o rectal ca s/p chemo/ rectal s known spread and d/c'd by Alen Blew    Principle Diagnosis: 84 year old woman squamous cell carcinoma of the perianal area diagnosed in April of 2015. She presented with perianal mass measuring 1.5 cm. Clinical stage TII N0.  Prior Therapy:  On 02/06/2014 she underwent an excision of a perianal mass which was done successfully without any complications. The pathology showed squamous cell carcinoma with surgical margins appeared negative for carcinoma. The specimen described to be measuring 3.4 x 2.0 cm but the lesion measuring 2.5 x 2.1 x 0.7 cm. She is status post adjuvant radiation therapy to the pelvic and perianal area between 6/17 and 05/14/2014. She received a total of 45 gray in 25 fractions to the right perianal skin.   04/16/2018 1st [facility] office visit/ Clarance Bollard   Chief Complaint  Patient presents with  . Pulmonary Consult    Referred by Dr. Tammi Klippel for eval of lung lesion.  She states she has been coughing "for years"- occ prod with clear sputum.    Not limited by breathing from desired activities   = walking the dog about 450 ft is the most she does  NO obvious day to day or daytime variability or assoc  purulent sputum or mucus plugs or hemoptysis or cp or chest tightness, subjective wheeze or overt sinus or hb symptoms. No unusual exposure hx or h/o childhood pna/ asthma or knowledge of premature birth.  Sleeping ok flat without nocturnal  or early am exacerbation  of respiratory  c/o's or need for noct saba. Also denies any obvious fluctuation of symptoms with weather or environmental changes or other aggravating or alleviating factors except as outlined above   Current Allergies, Complete Past  Medical History, Past Surgical History, Family History, and Social History were reviewed in [employer] record.  ROS  The following are not active complaints unless bolded Hoarseness, sore throat, dysphagia, dental problems s recent dental surgery, itching, sneezing,  nasal congestion or discharge of excess mucus or purulent secretions, ear ache,   fever, chills, sweats, unintended wt loss or wt gain, classically pleuritic or exertional cp,  orthopnea pnd or leg swelling, presyncope, palpitations, abdominal pain, anorexia, nausea, vomiting, diarrhea  or change in bowel habits or change in bladder habits, change in stools or change in urine, dysuria, hematuria,  rash, arthralgias, visual complaints, headache, numbness, weakness or ataxia or problems with walking or coordination,  change in mood/affect or memory.        Current Meds  Medication Sig  . ALPRAZolam (XANAX) 0.25 MG tablet Take 0.25 mg by mouth 2 (two) times daily as needed for anxiety. For anxiety  . aspirin EC 81 MG tablet Take 81 mg by mouth at bedtime.  . docusate sodium (COLACE) 100 MG capsule Take 100 mg by mouth daily as needed.  . hydrochlorothiazide (HYDRODIURIL) 25 MG tablet Take 12.5 mg by mouth daily after breakfast.  . KLOR-CON 10 10 MEQ tablet 1 TABLET WITH FOOD ONCE A DAY ORALLY 30 DAY(S)  . losartan (COZAAR) 50 MG tablet Take 50 mg by mouth daily.  Marland Kitchen lovastatin (MEVACOR) 40 MG tablet Take 40 mg by mouth every  evening.  . Multiple Vitamins-Minerals (OCUVITE PRESERVISION PO) Take by mouth 2 (two) times daily.  Marland Kitchen venlafaxine (EFFEXOR-XR) 75 MG 24 hr capsule Take 75 mg by mouth daily after breakfast.         Review of Systems     Objective:   Physical Exam    amb pleasant wf slt congested sounding cough   Wt Readings from Last 3 Encounters:  04/16/18 132 lb 3.2 oz (60 kg)  10/05/17 133 lb (60.3 kg)  03/29/17 130 lb 3.2 oz (59.1 kg)     Vital signs reviewed - Note on arrival 02 sats  96%  on RA but pulse only in 30's on pulse ox with ? good signal       HEENT: nl dentition, turbinates bilaterally, and oropharynx. Nl external ear canals without cough reflex   NECK :  without JVD/Nodes/TM/ nl carotid upstrokes bilaterally   LUNGS: no acc muscle use,  Nl contour chest which is clear to A and P bilaterally without cough on insp or exp maneuvers   CV:  RRR  no s3 or murmur or increase in P2, and no edema   ABD:  soft and nontender with nl inspiratory excursion in the supine position. No bruits or organomegaly appreciated, bowel sounds nl  MS:  Nl gait/ ext warm without deformities, calf tenderness, cyanosis or clubbing No obvious joint restrictions   SKIN: warm and dry without lesions    NEURO:  alert, approp, nl sensorium with  no motor or cerebellar deficits apparent.     ekg 04/16/2018  SR with a few PVC's only      Assessment:

## 2018-04-16 NOTE — Assessment & Plan Note (Addendum)
1st noted 04/10/14 Right lung base 2.0x1.8  - kidney ct 01/21/18 = 2.1 x 2.3 Right lung base  - CT s contrast ordered   Discussed in detail all the  indications, usual  risks and alternatives  relative to the benefits with patient who agrees to proceed with w/u with full chest ct now to compare to last version 07/26/16 and if a def change then PET then consider excisional bx based on good residual lung function despite active smoking   Total time devoted to counseling  > 50 % of initial 60 min office visit:  review case with pt/daughter discussion of options/alternatives/ personally creating written customized instructions  in presence of pt  then going over those specific  Instructions directly with the pt including how to use all of the meds but in particular covering each new medication in detail and the difference between the maintenance= "automatic" meds and the prns using an action plan format for the latter (If this problem/symptom => do that organization reading Left to right).  Please see AVS from this visit for a full list of these instructions which I personally wrote for this pt and  are unique to this visit.

## 2018-04-16 NOTE — Assessment & Plan Note (Signed)
4-5 min discussion re active cigarette smoking in addition to office E&M  Ask about tobacco use:   active Advise quitting    I reviewed the Fletcher curve with the patient that basically indicates  if you quit smoking when your best day FEV1 is still well preserved (as is clearly  the case here)  it is highly unlikely you will progress to severe disease and informed the patient there was  no medication on the market that has proven to alter the curve/ its downward trajectory  or the likelihood of progression of their disease(unlike other chronic medical conditions such as atheroclerosis where we do think we can change the natural hx with risk reducing meds)    Therefore stopping smoking and maintaining abstinence are  the most important aspects of care, not choice of inhalers or for that matter, doctors.  Treatment other than smoking cessation  is entirely directed by severity of symptoms and focused also on reducing exacerbations, not attempting to change the natural history of the disease.   Assess willingness:  Not committed at this point Assist in quit attempt:  Per PCP when ready Arrange follow up:   Follow up per Primary Care planned

## 2018-04-18 ENCOUNTER — Telehealth: Payer: Self-pay | Admitting: Internal Medicine

## 2018-04-18 NOTE — Telephone Encounter (Signed)
Jan with Dr [name] [facility]'s office called  Pt is there now, they are needing most recent EKG faxed to [phone]  This has been done Nothing further needed; will sign off

## 2018-04-30 ENCOUNTER — Ambulatory Visit (INDEPENDENT_AMBULATORY_CARE_PROVIDER_SITE_OTHER)
Admission: RE | Admit: 2018-04-30 | Discharge: 2018-04-30 | Disposition: A | Discharge status: Home / Self Care | Payer: Medicare Other | Source: Ambulatory Visit | Attending: Internal Medicine | Admitting: Internal Medicine

## 2018-04-30 DIAGNOSIS — R911 Solitary pulmonary nodule: Secondary | ICD-10-CM | POA: Diagnosis not present

## 2018-04-30 NOTE — Progress Notes (Signed)
Spoke with pt and notified of results per Dr. Wert. Pt verbalized understanding and denied any questions. 

## 2018-08-09 ENCOUNTER — Other Ambulatory Visit: Payer: Self-pay | Admitting: Family Medicine

## 2018-08-09 DIAGNOSIS — M810 Age-related osteoporosis without current pathological fracture: Secondary | ICD-10-CM

## 2018-10-14 ENCOUNTER — Ambulatory Visit
Admission: RE | Admit: 2018-10-14 | Discharge: 2018-10-14 | Disposition: A | Discharge status: Home / Self Care | Payer: Medicare Other | Source: Ambulatory Visit | Attending: Family Medicine | Admitting: Family Medicine

## 2018-10-14 DIAGNOSIS — M810 Age-related osteoporosis without current pathological fracture: Secondary | ICD-10-CM

## 2020-11-10 DIAGNOSIS — J449 Chronic obstructive pulmonary disease, unspecified: Secondary | ICD-10-CM | POA: Diagnosis not present

## 2020-11-10 DIAGNOSIS — M810 Age-related osteoporosis without current pathological fracture: Secondary | ICD-10-CM | POA: Diagnosis not present

## 2020-11-10 DIAGNOSIS — J42 Unspecified chronic bronchitis: Secondary | ICD-10-CM | POA: Diagnosis not present

## 2020-11-10 DIAGNOSIS — E782 Mixed hyperlipidemia: Secondary | ICD-10-CM | POA: Diagnosis not present

## 2020-11-10 DIAGNOSIS — I10 Essential (primary) hypertension: Secondary | ICD-10-CM | POA: Diagnosis not present

## 2020-12-15 ENCOUNTER — Inpatient Hospital Stay (HOSPITAL_COMMUNITY)
Admission: EM | Admit: 2020-12-15 | Discharge: 2020-12-27 | DRG: 286 | Disposition: A | Discharge status: Skilled Nursing Facility | Payer: Medicare Other | Attending: Internal Medicine | Admitting: Internal Medicine

## 2020-12-15 ENCOUNTER — Other Ambulatory Visit: Payer: Self-pay

## 2020-12-15 ENCOUNTER — Emergency Department (HOSPITAL_COMMUNITY)
Admit: 2020-12-15 | Discharge: 2020-12-15 | Disposition: A | Discharge status: Home / Self Care | Payer: Medicare Other | Attending: Emergency Medicine | Admitting: Emergency Medicine

## 2020-12-15 ENCOUNTER — Encounter (HOSPITAL_COMMUNITY): Payer: Self-pay | Admitting: Emergency Medicine

## 2020-12-15 ENCOUNTER — Emergency Department (HOSPITAL_COMMUNITY): Payer: Medicare Other

## 2020-12-15 DIAGNOSIS — M7989 Other specified soft tissue disorders: Secondary | ICD-10-CM

## 2020-12-15 DIAGNOSIS — I493 Ventricular premature depolarization: Secondary | ICD-10-CM | POA: Diagnosis present

## 2020-12-15 DIAGNOSIS — R0602 Shortness of breath: Secondary | ICD-10-CM | POA: Diagnosis not present

## 2020-12-15 DIAGNOSIS — I10 Essential (primary) hypertension: Secondary | ICD-10-CM | POA: Diagnosis not present

## 2020-12-15 DIAGNOSIS — D72829 Elevated white blood cell count, unspecified: Secondary | ICD-10-CM | POA: Diagnosis present

## 2020-12-15 DIAGNOSIS — F32A Depression, unspecified: Secondary | ICD-10-CM | POA: Diagnosis present

## 2020-12-15 DIAGNOSIS — E559 Vitamin D deficiency, unspecified: Secondary | ICD-10-CM | POA: Diagnosis not present

## 2020-12-15 DIAGNOSIS — I110 Hypertensive heart disease with heart failure: Principal | ICD-10-CM | POA: Diagnosis present

## 2020-12-15 DIAGNOSIS — E876 Hypokalemia: Secondary | ICD-10-CM | POA: Insufficient documentation

## 2020-12-15 DIAGNOSIS — R413 Other amnesia: Secondary | ICD-10-CM | POA: Diagnosis present

## 2020-12-15 DIAGNOSIS — M47816 Spondylosis without myelopathy or radiculopathy, lumbar region: Secondary | ICD-10-CM | POA: Diagnosis not present

## 2020-12-15 DIAGNOSIS — Z888 Allergy status to other drugs, medicaments and biological substances status: Secondary | ICD-10-CM

## 2020-12-15 DIAGNOSIS — I77810 Thoracic aortic ectasia: Secondary | ICD-10-CM | POA: Diagnosis present

## 2020-12-15 DIAGNOSIS — I429 Cardiomyopathy, unspecified: Secondary | ICD-10-CM

## 2020-12-15 DIAGNOSIS — R7303 Prediabetes: Secondary | ICD-10-CM | POA: Diagnosis not present

## 2020-12-15 DIAGNOSIS — E782 Mixed hyperlipidemia: Secondary | ICD-10-CM | POA: Diagnosis not present

## 2020-12-15 DIAGNOSIS — E871 Hypo-osmolality and hyponatremia: Secondary | ICD-10-CM | POA: Diagnosis not present

## 2020-12-15 DIAGNOSIS — J80 Acute respiratory distress syndrome: Secondary | ICD-10-CM | POA: Diagnosis not present

## 2020-12-15 DIAGNOSIS — J449 Chronic obstructive pulmonary disease, unspecified: Secondary | ICD-10-CM | POA: Diagnosis present

## 2020-12-15 DIAGNOSIS — Z800 Family history of malignant neoplasm of digestive organs: Secondary | ICD-10-CM

## 2020-12-15 DIAGNOSIS — Z20822 Contact with and (suspected) exposure to covid-19: Secondary | ICD-10-CM | POA: Diagnosis present

## 2020-12-15 DIAGNOSIS — Z87891 Personal history of nicotine dependence: Secondary | ICD-10-CM | POA: Diagnosis not present

## 2020-12-15 DIAGNOSIS — I5041 Acute combined systolic (congestive) and diastolic (congestive) heart failure: Secondary | ICD-10-CM | POA: Diagnosis not present

## 2020-12-15 DIAGNOSIS — D649 Anemia, unspecified: Secondary | ICD-10-CM | POA: Diagnosis not present

## 2020-12-15 DIAGNOSIS — Z9842 Cataract extraction status, left eye: Secondary | ICD-10-CM

## 2020-12-15 DIAGNOSIS — M810 Age-related osteoporosis without current pathological fracture: Secondary | ICD-10-CM | POA: Diagnosis not present

## 2020-12-15 DIAGNOSIS — T502X5A Adverse effect of carbonic-anhydrase inhibitors, benzothiadiazides and other diuretics, initial encounter: Secondary | ICD-10-CM | POA: Diagnosis not present

## 2020-12-15 DIAGNOSIS — R1110 Vomiting, unspecified: Secondary | ICD-10-CM | POA: Diagnosis not present

## 2020-12-15 DIAGNOSIS — M1711 Unilateral primary osteoarthritis, right knee: Secondary | ICD-10-CM | POA: Diagnosis not present

## 2020-12-15 DIAGNOSIS — N179 Acute kidney failure, unspecified: Secondary | ICD-10-CM | POA: Diagnosis not present

## 2020-12-15 DIAGNOSIS — I2510 Atherosclerotic heart disease of native coronary artery without angina pectoris: Secondary | ICD-10-CM | POA: Diagnosis not present

## 2020-12-15 DIAGNOSIS — Z85828 Personal history of other malignant neoplasm of skin: Secondary | ICD-10-CM

## 2020-12-15 DIAGNOSIS — I5043 Acute on chronic combined systolic (congestive) and diastolic (congestive) heart failure: Secondary | ICD-10-CM | POA: Diagnosis not present

## 2020-12-15 DIAGNOSIS — I5031 Acute diastolic (congestive) heart failure: Secondary | ICD-10-CM | POA: Diagnosis not present

## 2020-12-15 DIAGNOSIS — R5381 Other malaise: Secondary | ICD-10-CM | POA: Diagnosis not present

## 2020-12-15 DIAGNOSIS — M6281 Muscle weakness (generalized): Secondary | ICD-10-CM | POA: Diagnosis not present

## 2020-12-15 DIAGNOSIS — I700 Atherosclerosis of aorta: Secondary | ICD-10-CM | POA: Diagnosis not present

## 2020-12-15 DIAGNOSIS — R112 Nausea with vomiting, unspecified: Secondary | ICD-10-CM | POA: Diagnosis not present

## 2020-12-15 DIAGNOSIS — F419 Anxiety disorder, unspecified: Secondary | ICD-10-CM | POA: Diagnosis present

## 2020-12-15 DIAGNOSIS — I509 Heart failure, unspecified: Secondary | ICD-10-CM | POA: Insufficient documentation

## 2020-12-15 DIAGNOSIS — Z8249 Family history of ischemic heart disease and other diseases of the circulatory system: Secondary | ICD-10-CM

## 2020-12-15 DIAGNOSIS — Z96643 Presence of artificial hip joint, bilateral: Secondary | ICD-10-CM | POA: Diagnosis present

## 2020-12-15 DIAGNOSIS — J90 Pleural effusion, not elsewhere classified: Secondary | ICD-10-CM | POA: Diagnosis not present

## 2020-12-15 DIAGNOSIS — I340 Nonrheumatic mitral (valve) insufficiency: Secondary | ICD-10-CM | POA: Diagnosis present

## 2020-12-15 DIAGNOSIS — I272 Pulmonary hypertension, unspecified: Secondary | ICD-10-CM | POA: Diagnosis present

## 2020-12-15 DIAGNOSIS — R21 Rash and other nonspecific skin eruption: Secondary | ICD-10-CM | POA: Diagnosis not present

## 2020-12-15 DIAGNOSIS — E785 Hyperlipidemia, unspecified: Secondary | ICD-10-CM | POA: Diagnosis present

## 2020-12-15 DIAGNOSIS — I428 Other cardiomyopathies: Secondary | ICD-10-CM | POA: Diagnosis present

## 2020-12-15 DIAGNOSIS — Z7983 Long term (current) use of bisphosphonates: Secondary | ICD-10-CM

## 2020-12-15 DIAGNOSIS — Z79899 Other long term (current) drug therapy: Secondary | ICD-10-CM

## 2020-12-15 DIAGNOSIS — Z8261 Family history of arthritis: Secondary | ICD-10-CM

## 2020-12-15 DIAGNOSIS — I517 Cardiomegaly: Secondary | ICD-10-CM | POA: Diagnosis not present

## 2020-12-15 DIAGNOSIS — I959 Hypotension, unspecified: Secondary | ICD-10-CM | POA: Diagnosis not present

## 2020-12-15 DIAGNOSIS — Y92239 Unspecified place in hospital as the place of occurrence of the external cause: Secondary | ICD-10-CM | POA: Diagnosis not present

## 2020-12-15 DIAGNOSIS — Z90710 Acquired absence of both cervix and uterus: Secondary | ICD-10-CM

## 2020-12-15 DIAGNOSIS — R0989 Other specified symptoms and signs involving the circulatory and respiratory systems: Secondary | ICD-10-CM | POA: Diagnosis present

## 2020-12-15 DIAGNOSIS — Z923 Personal history of irradiation: Secondary | ICD-10-CM

## 2020-12-15 DIAGNOSIS — R739 Hyperglycemia, unspecified: Secondary | ICD-10-CM | POA: Diagnosis not present

## 2020-12-15 DIAGNOSIS — Z96651 Presence of right artificial knee joint: Secondary | ICD-10-CM | POA: Diagnosis present

## 2020-12-15 DIAGNOSIS — J42 Unspecified chronic bronchitis: Secondary | ICD-10-CM | POA: Diagnosis not present

## 2020-12-15 DIAGNOSIS — L8990 Pressure ulcer of unspecified site, unspecified stage: Secondary | ICD-10-CM | POA: Insufficient documentation

## 2020-12-15 DIAGNOSIS — E860 Dehydration: Secondary | ICD-10-CM | POA: Diagnosis not present

## 2020-12-15 DIAGNOSIS — Z801 Family history of malignant neoplasm of trachea, bronchus and lung: Secondary | ICD-10-CM

## 2020-12-15 DIAGNOSIS — M4186 Other forms of scoliosis, lumbar region: Secondary | ICD-10-CM | POA: Diagnosis not present

## 2020-12-15 DIAGNOSIS — Z9049 Acquired absence of other specified parts of digestive tract: Secondary | ICD-10-CM

## 2020-12-15 DIAGNOSIS — J9811 Atelectasis: Secondary | ICD-10-CM | POA: Diagnosis not present

## 2020-12-15 HISTORY — DX: Acute combined systolic (congestive) and diastolic (congestive) heart failure: I50.41

## 2020-12-15 LAB — CBC
HCT: 48 % — ABNORMAL HIGH (ref 36.0–46.0)
Hemoglobin: 15 g/dL (ref 12.0–15.0)
MCH: 30.7 pg (ref 26.0–34.0)
MCHC: 31.3 g/dL (ref 30.0–36.0)
MCV: 98.4 fL (ref 80.0–100.0)
Platelets: 171 10*3/uL (ref 150–400)
RBC: 4.88 MIL/uL (ref 3.87–5.11)
RDW: 15.8 % — ABNORMAL HIGH (ref 11.5–15.5)
WBC: 9.7 10*3/uL (ref 4.0–10.5)
nRBC: 0 % (ref 0.0–0.2)

## 2020-12-15 LAB — BASIC METABOLIC PANEL
Anion gap: 12 (ref 5–15)
BUN: 10 mg/dL (ref 8–23)
CO2: 30 mmol/L (ref 22–32)
Calcium: 9.2 mg/dL (ref 8.9–10.3)
Chloride: 104 mmol/L (ref 98–111)
Creatinine, Ser: 0.91 mg/dL (ref 0.44–1.00)
GFR, Estimated: >60 mL/min (ref >60)
Glucose, Bld: 147 mg/dL — ABNORMAL HIGH (ref 70–99)
Potassium: 2.7 mmol/L — CL (ref 3.5–5.1)
Sodium: 146 mmol/L — ABNORMAL HIGH (ref 135–145)

## 2020-12-15 LAB — TROPONIN I (HIGH SENSITIVITY): Troponin I (High Sensitivity): 58 ng/L — ABNORMAL HIGH (ref <18)

## 2020-12-15 LAB — BRAIN NATRIURETIC PEPTIDE: B Natriuretic Peptide: 3235 pg/mL — ABNORMAL HIGH (ref 0.0–100.0)

## 2020-12-15 LAB — RESP PANEL BY RT-PCR (FLU A&B, COVID) ARPGX2
Influenza A by PCR: NEGATIVE
Influenza B by PCR: NEGATIVE
SARS Coronavirus 2 by RT PCR: NEGATIVE

## 2020-12-15 LAB — MAGNESIUM: Magnesium: 2 mg/dL (ref 1.7–2.4)

## 2020-12-15 MED ORDER — HYDRALAZINE HCL 25 MG PO TABS
25.0000 mg | ORAL_TABLET | Freq: Four times a day (QID) | ORAL | Status: DC | PRN
  Administered 2020-12-16: 25 mg via ORAL
  Filled 2020-12-15: qty 1

## 2020-12-15 MED ORDER — LOSARTAN POTASSIUM 50 MG PO TABS: 50.0000 mg | ORAL_TABLET | Freq: Every day | ORAL | Status: DC

## 2020-12-15 MED ORDER — POTASSIUM CHLORIDE 10 MEQ/100ML IV SOLN
10.0000 meq | INTRAVENOUS | Status: AC
  Administered 2020-12-15 (×3): 10 meq via INTRAVENOUS
  Filled 2020-12-15 (×3): qty 100

## 2020-12-15 MED ORDER — LOSARTAN POTASSIUM 50 MG PO TABS
100.0000 mg | ORAL_TABLET | Freq: Every day | ORAL | Status: DC
  Administered 2020-12-15 – 2020-12-23 (×9): 100 mg via ORAL
  Filled 2020-12-15 (×9): qty 2

## 2020-12-15 MED ORDER — PRAVASTATIN SODIUM 40 MG PO TABS
40.0000 mg | ORAL_TABLET | Freq: Every day | ORAL | Status: DC
  Administered 2020-12-15 – 2020-12-26 (×11): 40 mg via ORAL
  Filled 2020-12-15 (×10): qty 1

## 2020-12-15 MED ORDER — DOCUSATE SODIUM 100 MG PO CAPS: 100.0000 mg | ORAL_CAPSULE | Freq: Every day | ORAL | Status: DC | PRN

## 2020-12-15 MED ORDER — FUROSEMIDE 10 MG/ML IJ SOLN
20.0000 mg | Freq: Two times a day (BID) | INTRAMUSCULAR | Status: DC
  Administered 2020-12-16: 20 mg via INTRAVENOUS
  Filled 2020-12-15: qty 2

## 2020-12-15 MED ORDER — SODIUM CHLORIDE 0.9 % IV SOLN: 250.0000 mL | INTRAVENOUS | Status: DC | PRN

## 2020-12-15 MED ORDER — ACETAMINOPHEN 325 MG PO TABS
650.0000 mg | ORAL_TABLET | ORAL | Status: DC | PRN
  Administered 2020-12-16 – 2020-12-21 (×6): 650 mg via ORAL
  Filled 2020-12-15 (×6): qty 2

## 2020-12-15 MED ORDER — ENOXAPARIN SODIUM 40 MG/0.4ML ~~LOC~~ SOLN
40.0000 mg | SUBCUTANEOUS | Status: DC
  Administered 2020-12-15 – 2020-12-16 (×2): 40 mg via SUBCUTANEOUS
  Filled 2020-12-15 (×2): qty 0.4

## 2020-12-15 MED ORDER — FUROSEMIDE 10 MG/ML IJ SOLN
40.0000 mg | Freq: Once | INTRAMUSCULAR | Status: AC
  Administered 2020-12-15: 40 mg via INTRAVENOUS
  Filled 2020-12-15: qty 4

## 2020-12-15 MED ORDER — SODIUM CHLORIDE 0.9% FLUSH
3.0000 mL | Freq: Two times a day (BID) | INTRAVENOUS | Status: DC
  Administered 2020-12-16 – 2020-12-27 (×16): 3 mL via INTRAVENOUS

## 2020-12-15 MED ORDER — SODIUM CHLORIDE 0.9% FLUSH: 3.0000 mL | INTRAVENOUS | Status: DC | PRN

## 2020-12-15 MED ORDER — ASPIRIN EC 81 MG PO TBEC
81.0000 mg | DELAYED_RELEASE_TABLET | Freq: Every day | ORAL | Status: DC
  Administered 2020-12-15 – 2020-12-26 (×12): 81 mg via ORAL
  Filled 2020-12-15 (×12): qty 1

## 2020-12-15 MED ORDER — POTASSIUM CHLORIDE CRYS ER 20 MEQ PO TBCR
40.0000 meq | EXTENDED_RELEASE_TABLET | Freq: Once | ORAL | Status: AC
  Administered 2020-12-15: 40 meq via ORAL
  Filled 2020-12-15: qty 2

## 2020-12-15 MED ORDER — ONDANSETRON HCL 4 MG/2ML IJ SOLN
4.0000 mg | Freq: Four times a day (QID) | INTRAMUSCULAR | Status: DC | PRN
  Administered 2020-12-21: 4 mg via INTRAVENOUS
  Filled 2020-12-15: qty 2

## 2020-12-15 MED ORDER — ALPRAZOLAM 0.25 MG PO TABS
0.2500 mg | ORAL_TABLET | Freq: Two times a day (BID) | ORAL | Status: DC | PRN
  Administered 2020-12-16 – 2020-12-25 (×5): 0.25 mg via ORAL
  Filled 2020-12-15 (×5): qty 1

## 2020-12-15 MED ORDER — VENLAFAXINE HCL ER 75 MG PO CP24
75.0000 mg | ORAL_CAPSULE | Freq: Every day | ORAL | Status: DC
  Administered 2020-12-16 – 2020-12-27 (×12): 75 mg via ORAL
  Filled 2020-12-15 (×13): qty 1

## 2020-12-15 NOTE — H&P (Signed)
History and Physical    [name] DXI:[id number] DOB: 07/25/1934 DOA: 12/15/2020  PCP: Hulan Fess, MD (Confirm with patient/family/NH records and if not entered, this has to be entered at [facility] point of entry) Patient coming from: Home  I have personally briefly reviewed patient's old medical records in [place]  Chief Complaint: SOB  HPI: [name] is a 87 y.o. female with medical history significant of HTN on losartan (off HCTZ for hyponatremia), COPD, anxiety/depression, presented with new onset of shortness of breath.  Symptoms started 4 to 5 days ago, initially was exertional dyspnea and since yesterday SOB has become constant even at rest, no chest pain. Dry cough no wheezing. No fever or chills. Also noticed leg swelling but no pain.  Patient has been on single BP med/Losartan since last year and last check-up was > 6 months ago, BP was controlled at that time.  ED Course: BP significantly elevated, fluid overload, chest x-ray with bilateral pleural effusion. K 2.7. Mg 2.0    Review of Systems: As per HPI otherwise 14 point review of systems negative.    Past Medical History:  Diagnosis Date  . Anxiety   . Arthritis    knees, hands   . Cancer (HCC)    SQUAMOuS CELL CARCINOMA OF SKIN- PERIANAL   . COPD (chronic obstructive pulmonary disease) ([place])    smoker  . Depression   . Elbow fracture, right 2013  . Hyperlipidemia   . Hypertension   . Osteoporosis   . Pneumonia    hx of   . S/P radiation therapy 04/08/2014-05/14/2014   Right perianal skin / 45 Gy in 25 fractions  . Wears glasses     Past Surgical History:  Procedure Laterality Date  . ABDOMINAL HYSTERECTOMY     TOTAL VAGINAL HYSTERECTOMY  . APPENDECTOMY    . CHOLECYSTECTOMY    . ELBOW FRACTURE SURGERY  2013   rt  . EVALUATION UNDER ANESTHESIA WITH HEMORRHOIDECTOMY N/A 05/29/2016   Procedure: EXAM UNDER ANESTHESIA WITH INTERNAL AND EXTERNAL HEMORRHOIDECTOMY;  Surgeon: Georganna Skeans, MD;   Location: [place];  Service: General;  Laterality: N/A;  . EYE SURGERY     left eye cataract surgery   . HEMORRHOID SURGERY N/A 02/06/2014   Procedure: EXCISION PERIANAL MASS;  Surgeon: Zenovia Jarred, MD;  Location: [place];  Service: General;  Laterality: N/A;  . HERNIA REPAIR     left inguinal hernia repair   . I & D EXTREMITY Left 03/05/2013   Procedure: Irrigation and Debridement Olecranon Bursa/Left Elbow;  Surgeon: Linna Hoff, MD;  Location: [place];  Service: Orthopedics;  Laterality: Left;  . JOINT REPLACEMENT     right and left hip replacement   . MASS EXCISION N/A 05/24/2015   Procedure: EXCISION ANAL MASS X 2;  Surgeon: Georganna Skeans, MD;  Location: [place];  Service: General;  Laterality: N/A;  . OTHER SURGICAL HISTORY     arthroscopic right knee   . OTHER SURGICAL HISTORY     right carpal tunnel  . OTHER SURGICAL HISTORY     right elbow surgery   . OTHER SURGICAL HISTORY     oophorectomy 1958  . RECTAL BIOPSY N/A 05/29/2016   Procedure: ANAL BIOPSY;  Surgeon: Georganna Skeans, MD;  Location: [place];  Service: General;  Laterality: N/A;  . TOTAL KNEE ARTHROPLASTY  12/13/2011   Procedure: TOTAL KNEE ARTHROPLASTY;  Surgeon: Tobi Bastos, MD;  Location: [facility];  Service: Orthopedics;  Laterality: Right;     reports that she has been smoking cigarettes. She has a 25.00 pack-year smoking history. She has never used smokeless tobacco. She reports that she does not drink alcohol and does not use drugs.  Allergies  Allergen Reactions  . Prolia [Denosumab] Other (See Comments)    Severe pain, could not fuction    Family History  Problem Relation Age of Onset  . Hypertension Mother   . Cancer Father        lung  . Arthritis Father   . Cancer Sister 36       colon   . Cancer Child 77       thyroid     Prior to Admission medications   Medication Sig Start Date End Date Taking? Authorizing  Provider  ALPRAZolam (XANAX) 0.25 MG tablet Take 0.25 mg by mouth 2 (two) times daily as needed for anxiety. For anxiety    [provider]  aspirin EC 81 MG tablet Take 81 mg by mouth at bedtime.    [provider]  docusate sodium (COLACE) 100 MG capsule Take 100 mg by mouth daily as needed. 03/07/13   Iran Planas, MD  hydrochlorothiazide (HYDRODIURIL) 25 MG tablet Take 12.5 mg by mouth daily after breakfast.    [provider]  KLOR-CON 10 10 MEQ tablet 1 TABLET WITH FOOD ONCE A DAY ORALLY 30 DAY(S) 03/03/17   [provider]  losartan (COZAAR) 50 MG tablet Take 50 mg by mouth daily. 02/23/14   [provider]  lovastatin (MEVACOR) 40 MG tablet Take 40 mg by mouth every evening.    [provider]  Multiple Vitamins-Minerals (OCUVITE PRESERVISION PO) Take by mouth 2 (two) times daily.    [provider]  venlafaxine (EFFEXOR-XR) 75 MG 24 hr capsule Take 75 mg by mouth daily after breakfast.    [provider]    Physical Exam: Vitals:   12/15/20 1700 12/15/20 1800 12/15/20 1925 12/15/20 1928  BP: (!) 163/104 (!) 166/101  (!) 172/104  Pulse: 88 80  88  Resp: 18 16  (!) 32  Temp:   98.1 F (36.7 C)   TempSrc:   Oral   SpO2: 96% 92%  93%    Constitutional: NAD, calm, comfortable Vitals:   12/15/20 1700 12/15/20 1800 12/15/20 1925 12/15/20 1928  BP: (!) 163/104 (!) 166/101  (!) 172/104  Pulse: 88 80  88  Resp: 18 16  (!) 32  Temp:   98.1 F (36.7 C)   TempSrc:   Oral   SpO2: 96% 92%  93%   Eyes: PERRL, lids and conjunctivae normal ENMT: Mucous membranes are moist. Posterior pharynx clear of any exudate or lesions.Normal dentition.  Neck: normal, supple, no masses, no thyromegaly Respiratory: clear to auscultation bilaterally, no wheezing, fine crackles to B/L mid level. Increasing respiratory effort. No accessory muscle use.  Cardiovascular: Regular rate and rhythm, no murmurs / rubs / gallops. 2+ extremity  edema. 2+ pedal pulses. No carotid bruits.  Abdomen: no tenderness, no masses palpated. No hepatosplenomegaly. Bowel sounds positive.  Musculoskeletal: no clubbing / cyanosis. No joint deformity upper and lower extremities. Good ROM, no contractures. Normal muscle tone.  Skin: no rashes, lesions, ulcers. No induration Neurologic: CN 2-12 grossly intact. Sensation intact, DTR normal. Strength 5/5 in all 4.  Psychiatric: Normal judgment and insight. Alert and oriented x 3. Normal mood.     Labs on Admission: I have personally  reviewed following labs and imaging studies  CBC: Recent Labs  Lab 12/15/20 1514  WBC 9.7  HGB 15.0  HCT 48.0*  MCV 98.4  PLT 244   Basic Metabolic Panel: Recent Labs  Lab 12/15/20 1514  NA 146*  K 2.7*  CL 104  CO2 30  GLUCOSE 147*  BUN 10  CREATININE 0.91  CALCIUM 9.2  MG 2.0   GFR: CrCl cannot be calculated (Unknown ideal weight.). Liver Function Tests: No results for input(s): AST, ALT, ALKPHOS, BILITOT, PROT, ALBUMIN in the last 168 hours. No results for input(s): LIPASE, AMYLASE in the last 168 hours. No results for input(s): AMMONIA in the last 168 hours. Coagulation Profile: No results for input(s): INR, PROTIME in the last 168 hours. Cardiac Enzymes: No results for input(s): CKTOTAL, CKMB, CKMBINDEX, TROPONINI in the last 168 hours. BNP (last 3 results) No results for input(s): PROBNP in the last 8760 hours. HbA1C: No results for input(s): HGBA1C in the last 72 hours. CBG: No results for input(s): GLUCAP in the last 168 hours. Lipid Profile: No results for input(s): CHOL, HDL, LDLCALC, TRIG, CHOLHDL, LDLDIRECT in the last 72 hours. Thyroid Function Tests: No results for input(s): TSH, T4TOTAL, FREET4, T3FREE, THYROIDAB in the last 72 hours. Anemia Panel: No results for input(s): VITAMINB12, FOLATE, FERRITIN, TIBC, IRON, RETICCTPCT in the last 72 hours. Urine analysis:    Component Value Date/Time   COLORURINE YELLOW 12/06/2011  1000   APPEARANCEUR CLEAR 12/06/2011 1000   LABSPEC 1.013 12/06/2011 1000   PHURINE 6.5 12/06/2011 1000   GLUCOSEU NEGATIVE 12/06/2011 1000   HGBUR NEGATIVE 12/06/2011 1000   BILIRUBINUR NEGATIVE 12/06/2011 1000   KETONESUR NEGATIVE 12/06/2011 1000   PROTEINUR NEGATIVE 12/06/2011 1000   UROBILINOGEN 0.2 12/06/2011 1000   NITRITE NEGATIVE 12/06/2011 1000   LEUKOCYTESUR NEGATIVE 12/06/2011 1000    Radiological Exams on Admission: DG Chest 2 View  Result Date: 12/15/2020 CLINICAL DATA:  87 year old female with shortness of breath. EXAM: CHEST - 2 VIEW COMPARISON:  Chest radiograph dated 03/05/2013 FINDINGS: Small bilateral pleural effusions, left greater right with associated bibasilar compressive atelectasis. Pneumonia is not excluded clinical correlation is recommended. There is diffuse interstitial and vascular prominence consistent with edema. No pneumothorax. There is cardiomegaly. Atherosclerotic calcification of the aorta. Osteopenia with scoliosis and degenerative changes of the spine. A 16 mm ovoid radiopaque focus over the spine on the lateral view is not identified on the frontal projection. No acute osseous pathology. IMPRESSION: CHF with small bilateral pleural effusions and bibasilar atelectasis. Pneumonia is not excluded. Electronically Signed   By: Anner Crete M.D.   On: 12/15/2020 15:56   VAS [place] LOWER EXTREMITY VENOUS (DVT) (ONLY [facility] & [facility])  Result Date: 12/15/2020  Lower Venous DVT Study Indications: Swelling.  Risk Factors: None identified. Comparison Study: No prior studies. Performing Technologist: Oliver Hum RVT  Examination Guidelines: A complete evaluation includes B-mode imaging, spectral Doppler, color Doppler, and power Doppler as needed of all accessible portions of each vessel. Bilateral testing is considered an integral part of a complete examination. Limited examinations for reoccurring indications may be performed as noted. The reflux portion of the exam is  performed with the patient in reverse Trendelenburg.  +-----+---------------+---------+-----------+----------+--------------+ RIGHTCompressibilityPhasicitySpontaneityPropertiesThrombus Aging +-----+---------------+---------+-----------+----------+--------------+ CFV  Full           Yes      Yes                                 +-----+---------------+---------+-----------+----------+--------------+   +---------+---------------+---------+-----------+----------+--------------+  LEFT     CompressibilityPhasicitySpontaneityPropertiesThrombus Aging +---------+---------------+---------+-----------+----------+--------------+ CFV      Full           Yes      Yes                                 +---------+---------------+---------+-----------+----------+--------------+ SFJ      Full                                                        +---------+---------------+---------+-----------+----------+--------------+ FV Prox  Full                                                        +---------+---------------+---------+-----------+----------+--------------+ FV Mid   Full                                                        +---------+---------------+---------+-----------+----------+--------------+ FV DistalFull                                                        +---------+---------------+---------+-----------+----------+--------------+ PFV      Full                                                        +---------+---------------+---------+-----------+----------+--------------+ POP      Full           Yes      Yes                                 +---------+---------------+---------+-----------+----------+--------------+ PTV      Full                                                        +---------+---------------+---------+-----------+----------+--------------+ PERO     Full                                                         +---------+---------------+---------+-----------+----------+--------------+     Summary: RIGHT: - No evidence of common femoral vein obstruction.  LEFT: - There is no evidence of deep vein thrombosis in the lower extremity.  - No cystic structure found in the popliteal fossa.  *See table(s) above for measurements and observations.  Preliminary     EKG: Independently reviewed. Sinus, LVH, poor R progression  Assessment/Plan Active Problems:   Acute CHF (congestive heart failure) (HCC)   CHF (congestive heart failure) ([place])  (please populate well all problems here in Problem List. (For example, if patient is on BP meds at home and you resume or decide to hold them, it is a problem that needs to be her. Same for CAD, COPD, HLD and so on)  Acute (probably on chronic) CHF decompensation -Given significant elevation of blood pressure and EKG changes, suspect diastolic CHF, which probably has chronic element. -Significantly fluid overload, Lasix IV twice daily -Blood pressure significantly elevated, increase losartan dose, and as needed hydralazine, consider Coreg. -Echocardiogram, and consider consult cardiology if abnormal finding on echo. -UA and TSH -repeat chest Xray in AM.  Hypokalemia, severe -IV and p.o. replacement -Magnesium level normal  HTN, uncontrolled -As above.  COPD -No symptoms or signs of acute exacerbation  HLD -On statin  Elevated glucose -Check A1C  DVT prophylaxis: Lovenox  code Status: Full Code Family Communication:Daughter at bedside Disposition Plan: Expect 1-2 days hospital stay to treat and work up for CHF. Consults called: None Admission status: Tele admit   Lequita Halt MD [facility] Pager [phone]  12/15/2020, 7:59 PM

## 2020-12-15 NOTE — Progress Notes (Signed)
Left lower extremity venous duplex has been completed. Preliminary results can be found in CV Proc through chart review.  Results were given to Dr. Darl Householder.  12/15/20 5:40 PM Carlos Levering RVT

## 2020-12-15 NOTE — ED Triage Notes (Signed)
Patient complains of shortness of breath for the last few weeks. Denies pain, denies nausea, dizziness. Patient alert, oriented, and in no apparent distress at this time.

## 2020-12-15 NOTE — ED Provider Notes (Signed)
[facility] EMERGENCY DEPARTMENT Provider Note   CSN: [id number] Arrival date & time: 12/15/20  1506     History Chief Complaint  Patient presents with  . Shortness of Breath    [name] is a 87 y.o. female history of COPD, squamous cell carcinoma, hypertension, here presenting with shortness of breath.  Patient has been having shortness of breath for the last several weeks.  Per the daughter, shortness of breath got progressively worse for the last 4 to 5 days.  Associated with bilateral leg swelling as well.  Patient has no history of heart failure or CAD.  Patient denies any exposures to Covid.  She states that she has bilateral leg cramps, worse on the left side   The history is provided by the patient.       Past Medical History:  Diagnosis Date  . Anxiety   . Arthritis    knees, hands   . Cancer (HCC)    SQUAMOuS CELL CARCINOMA OF SKIN- PERIANAL   . COPD (chronic obstructive pulmonary disease) ([place])    smoker  . Depression   . Elbow fracture, right 2013  . Hyperlipidemia   . Hypertension   . Osteoporosis   . Pneumonia    hx of   . S/P radiation therapy 04/08/2014-05/14/2014   Right perianal skin / 45 Gy in 25 fractions  . Wears glasses     Patient Active Problem List   Diagnosis Date Noted  . Chronic bronchitis with productive mucopurulent cough ([place]) 04/16/2018  . Bradycardia 04/16/2018  . Solitary pulmonary nodule on lung CT 04/16/2018  . Cigarette smoker 04/16/2018  . Osteoporosis, unspecified 04/02/2014  . Squamous cell carcinoma of anal margin 03/23/2014  . Squamous cell carcinoma of skin 03/04/2014  . Perianal mass 01/28/2014  . Osteoarthritis of right knee 12/13/2011    Past Surgical History:  Procedure Laterality Date  . ABDOMINAL HYSTERECTOMY     TOTAL VAGINAL HYSTERECTOMY  . APPENDECTOMY    . CHOLECYSTECTOMY    . ELBOW FRACTURE SURGERY  2013   rt  . EVALUATION UNDER ANESTHESIA WITH HEMORRHOIDECTOMY N/A 05/29/2016    Procedure: EXAM UNDER ANESTHESIA WITH INTERNAL AND EXTERNAL HEMORRHOIDECTOMY;  Surgeon: Georganna Skeans, MD;  Location: [facility];  Service: General;  Laterality: N/A;  . EYE SURGERY     left eye cataract surgery   . HEMORRHOID SURGERY N/A 02/06/2014   Procedure: EXCISION PERIANAL MASS;  Surgeon: Zenovia Jarred, MD;  Location: [facility];  Service: General;  Laterality: N/A;  . HERNIA REPAIR     left inguinal hernia repair   . I & D EXTREMITY Left 03/05/2013   Procedure: Irrigation and Debridement Olecranon Bursa/Left Elbow;  Surgeon: Linna Hoff, MD;  Location: [facility];  Service: Orthopedics;  Laterality: Left;  . JOINT REPLACEMENT     right and left hip replacement   . MASS EXCISION N/A 05/24/2015   Procedure: EXCISION ANAL MASS X 2;  Surgeon: Georganna Skeans, MD;  Location: [facility];  Service: General;  Laterality: N/A;  . OTHER SURGICAL HISTORY     arthroscopic right knee   . OTHER SURGICAL HISTORY     right carpal tunnel  . OTHER SURGICAL HISTORY     right elbow surgery   . OTHER SURGICAL HISTORY     oophorectomy 1958  . RECTAL BIOPSY N/A 05/29/2016   Procedure: ANAL BIOPSY;  Surgeon: Georganna Skeans, MD;  Location: [facility];  Service: General;  Laterality: N/A;  . TOTAL KNEE ARTHROPLASTY  12/13/2011   Procedure: TOTAL KNEE ARTHROPLASTY;  Surgeon: Tobi Bastos, MD;  Location: [facility];  Service: Orthopedics;  Laterality: Right;     OB History    Gravida  4   Para  4   Term      Preterm      AB      Living  4     SAB      IAB      Ectopic      Multiple      Live Births              Family History  Problem Relation Age of Onset  . Hypertension Mother   . Cancer Father        lung  . Arthritis Father   . Cancer Sister 1       colon   . Cancer Child 67       thyroid    Social History   Tobacco Use  . Smoking status: Current Every Day Smoker    Packs/day: 0.50    Years: 50.00    Pack  years: 25.00    Types: Cigarettes  . Smokeless tobacco: Never Used  Substance Use Topics  . Alcohol use: No  . Drug use: No    Home Medications Prior to Admission medications   Medication Sig Start Date End Date Taking? Authorizing Provider  ALPRAZolam (XANAX) 0.25 MG tablet Take 0.25 mg by mouth 2 (two) times daily as needed for anxiety. For anxiety    [provider]  aspirin EC 81 MG tablet Take 81 mg by mouth at bedtime.    [provider]  docusate sodium (COLACE) 100 MG capsule Take 100 mg by mouth daily as needed. 03/07/13   Iran Planas, MD  hydrochlorothiazide (HYDRODIURIL) 25 MG tablet Take 12.5 mg by mouth daily after breakfast.    [provider]  KLOR-CON 10 10 MEQ tablet 1 TABLET WITH FOOD ONCE A DAY ORALLY 30 DAY(S) 03/03/17   [provider]  losartan (COZAAR) 50 MG tablet Take 50 mg by mouth daily. 02/23/14   [provider]  lovastatin (MEVACOR) 40 MG tablet Take 40 mg by mouth every evening.    [provider]  Multiple Vitamins-Minerals (OCUVITE PRESERVISION PO) Take by mouth 2 (two) times daily.    [provider]  venlafaxine (EFFEXOR-XR) 75 MG 24 hr capsule Take 75 mg by mouth daily after breakfast.    [provider]    Allergies    Prolia [denosumab]  Review of Systems   Review of Systems  Respiratory: Positive for shortness of breath.   All other systems reviewed and are negative.   Physical Exam Updated Vital Signs BP (!) 166/101   Pulse 80   Temp 97.9 F (36.6 C) (Oral)   Resp 16   SpO2 92%   Physical Exam Vitals and nursing note reviewed.  Constitutional:      Comments: Slightly tachypneic   HENT:     Head: Normocephalic.     Mouth/Throat:     Mouth: Mucous membranes are moist.  Eyes:     Extraocular Movements: Extraocular movements intact.     Pupils: Pupils are equal, round, and reactive to light.  Cardiovascular:     Rate and Rhythm: Normal rate and regular rhythm.   Pulmonary:     Comments: Crackles bilaterally  Abdominal:     General: Bowel  sounds are normal.     Palpations: Abdomen is soft.  Musculoskeletal:        General: Normal range of motion.     Cervical back: Normal range of motion and neck supple.     Comments: Mild L calf tenderness, 2+ edema bilaterally   Skin:    General: Skin is warm.     Capillary Refill: Capillary refill takes less than 2 seconds.  Neurological:     General: No focal deficit present.     Mental Status: She is oriented to person, place, and time.  Psychiatric:        Mood and Affect: Mood normal.        Behavior: Behavior normal.     ED Results / Procedures / Treatments   Labs (all labs ordered are listed, but only abnormal results are displayed) Labs Reviewed  BASIC METABOLIC PANEL - Abnormal; Notable for the following components:      Result Value   Sodium 146 (*)    Potassium 2.7 (*)    Glucose, Bld 147 (*)    All other components within normal limits  CBC - Abnormal; Notable for the following components:   HCT 48.0 (*)    RDW 15.8 (*)    All other components within normal limits  BRAIN NATRIURETIC PEPTIDE - Abnormal; Notable for the following components:   B Natriuretic Peptide 3,235.0 (*)    All other components within normal limits  TROPONIN I (HIGH SENSITIVITY) - Abnormal; Notable for the following components:   Troponin I (High Sensitivity) 58 (*)    All other components within normal limits  RESP PANEL BY RT-PCR (FLU A&B, COVID) ARPGX2  MAGNESIUM    EKG EKG Interpretation  Date/Time:  Wednesday December 15 2020 15:16:11 EST Ventricular Rate:  87 PR Interval:  176 QRS Duration: 98 QT Interval:  340 QTC Calculation: 409 R Axis:   -55 Text Interpretation: Normal sinus rhythm Possible Left atrial enlargement Left axis deviation Incomplete right bundle branch block Left ventricular hypertrophy ( R in aVL , Cornell product ) Cannot rule out Septal infarct , age undetermined Abnormal ECG  No significant change since last tracing Confirmed by Wandra Arthurs [phone]) on 12/15/2020 4:50:55 PM   Radiology DG Chest 2 View  Result Date: 12/15/2020 CLINICAL DATA:  87 year old female with shortness of breath. EXAM: CHEST - 2 VIEW COMPARISON:  Chest radiograph dated 03/05/2013 FINDINGS: Small bilateral pleural effusions, left greater right with associated bibasilar compressive atelectasis. Pneumonia is not excluded clinical correlation is recommended. There is diffuse interstitial and vascular prominence consistent with edema. No pneumothorax. There is cardiomegaly. Atherosclerotic calcification of the aorta. Osteopenia with scoliosis and degenerative changes of the spine. A 16 mm ovoid radiopaque focus over the spine on the lateral view is not identified on the frontal projection. No acute osseous pathology. IMPRESSION: CHF with small bilateral pleural effusions and bibasilar atelectasis. Pneumonia is not excluded. Electronically Signed   By: Anner Crete M.D.   On: 12/15/2020 15:56   VAS [place] LOWER EXTREMITY VENOUS (DVT) (ONLY [facility] & [facility])  Result Date: 12/15/2020  Lower Venous DVT Study Indications: Swelling.  Risk Factors: None identified. Comparison Study: No prior studies. Performing Technologist: Oliver Hum RVT  Examination Guidelines: A complete evaluation includes B-mode imaging, spectral Doppler, color Doppler, and power Doppler as needed of all accessible portions of each vessel. Bilateral testing is considered an integral part of a complete examination. Limited examinations for reoccurring indications may be performed as noted. The  reflux portion of the exam is performed with the patient in reverse Trendelenburg.  +-----+---------------+---------+-----------+----------+--------------+ RIGHTCompressibilityPhasicitySpontaneityPropertiesThrombus Aging +-----+---------------+---------+-----------+----------+--------------+ CFV  Full           Yes      Yes                                  +-----+---------------+---------+-----------+----------+--------------+   +---------+---------------+---------+-----------+----------+--------------+ LEFT     CompressibilityPhasicitySpontaneityPropertiesThrombus Aging +---------+---------------+---------+-----------+----------+--------------+ CFV      Full           Yes      Yes                                 +---------+---------------+---------+-----------+----------+--------------+ SFJ      Full                                                        +---------+---------------+---------+-----------+----------+--------------+ FV Prox  Full                                                        +---------+---------------+---------+-----------+----------+--------------+ FV Mid   Full                                                        +---------+---------------+---------+-----------+----------+--------------+ FV DistalFull                                                        +---------+---------------+---------+-----------+----------+--------------+ PFV      Full                                                        +---------+---------------+---------+-----------+----------+--------------+ POP      Full           Yes      Yes                                 +---------+---------------+---------+-----------+----------+--------------+ PTV      Full                                                        +---------+---------------+---------+-----------+----------+--------------+ PERO     Full                                                        +---------+---------------+---------+-----------+----------+--------------+  Summary: RIGHT: - No evidence of common femoral vein obstruction.  LEFT: - There is no evidence of deep vein thrombosis in the lower extremity.  - No cystic structure found in the popliteal fossa.  *See table(s) above for measurements and observations.    Preliminary      Procedures Procedures   CRITICAL CARE Performed by: Wandra Arthurs   Total critical care time: 30 minutes  Critical care time was exclusive of separately billable procedures and treating other patients.  Critical care was necessary to treat or prevent imminent or life-threatening deterioration.  Critical care was time spent personally by me on the following activities: development of treatment plan with patient and/or surrogate as well as nursing, discussions with consultants, evaluation of patient's response to treatment, examination of patient, obtaining history from patient or surrogate, ordering and performing treatments and interventions, ordering and review of laboratory studies, ordering and review of radiographic studies, pulse oximetry and re-evaluation of patient's condition.   Medications Ordered in ED Medications  potassium chloride 10 mEq in 100 mL IVPB (10 mEq Intravenous New Bag/Given 12/15/20 1848)  potassium chloride SA (KLOR-CON) CR tablet 40 mEq (40 mEq Oral Given 12/15/20 1841)  furosemide (LASIX) injection 40 mg (40 mg Intravenous Given 12/15/20 1842)    ED Course  I have reviewed the triage vital signs and the nursing notes.  Pertinent labs & imaging results that were available during my care of the patient were reviewed by me and considered in my medical decision making (see chart for details).    MDM Rules/Calculators/A&P                         Jyssica FRANCENE MCERLEAN is a 87 y.o. female here with SOB and leg swelling.  Likely new onset CHF versus DVT.  Plan to get CBC, CMP, DVT study, BNP, troponin.     6:52 PM K 2.7, given multiple rounds of potassium IV.  Patient does desat to 89 to 90%.  Patient's BNP is elevated at 3200. CXR showed pulmonary edema. DVT study negative. Given lasix. Will admit for hypokalemia, new onset CHF    Final Clinical Impression(s) / ED Diagnoses Final diagnoses:  None    Rx / DC Orders ED Discharge Orders    None        Drenda Freeze, MD 12/15/20 [phone]

## 2020-12-16 ENCOUNTER — Encounter (HOSPITAL_COMMUNITY): Payer: Self-pay | Admitting: Internal Medicine

## 2020-12-16 ENCOUNTER — Inpatient Hospital Stay (HOSPITAL_COMMUNITY): Payer: Medicare Other

## 2020-12-16 DIAGNOSIS — I10 Essential (primary) hypertension: Secondary | ICD-10-CM | POA: Diagnosis present

## 2020-12-16 DIAGNOSIS — I509 Heart failure, unspecified: Secondary | ICD-10-CM | POA: Diagnosis not present

## 2020-12-16 DIAGNOSIS — I5031 Acute diastolic (congestive) heart failure: Secondary | ICD-10-CM | POA: Diagnosis not present

## 2020-12-16 DIAGNOSIS — I5041 Acute combined systolic (congestive) and diastolic (congestive) heart failure: Secondary | ICD-10-CM | POA: Diagnosis not present

## 2020-12-16 DIAGNOSIS — E876 Hypokalemia: Secondary | ICD-10-CM | POA: Diagnosis not present

## 2020-12-16 DIAGNOSIS — I429 Cardiomyopathy, unspecified: Secondary | ICD-10-CM

## 2020-12-16 DIAGNOSIS — J449 Chronic obstructive pulmonary disease, unspecified: Secondary | ICD-10-CM | POA: Diagnosis present

## 2020-12-16 LAB — BASIC METABOLIC PANEL
Anion gap: 12 (ref 5–15)
BUN: 12 mg/dL (ref 8–23)
CO2: 28 mmol/L (ref 22–32)
Calcium: 9 mg/dL (ref 8.9–10.3)
Chloride: 104 mmol/L (ref 98–111)
Creatinine, Ser: 0.88 mg/dL (ref 0.44–1.00)
GFR, Estimated: >60 mL/min (ref >60)
Glucose, Bld: 122 mg/dL — ABNORMAL HIGH (ref 70–99)
Potassium: 3.5 mmol/L (ref 3.5–5.1)
Sodium: 144 mmol/L (ref 135–145)

## 2020-12-16 LAB — ECHOCARDIOGRAM COMPLETE
AR max vel: 1.94 cm2
AV Area VTI: 1.93 cm2
AV Area mean vel: 2.03 cm2
AV Mean grad: 4 mmHg
AV Peak grad: 7.7 mmHg
Ao pk vel: 1.39 m/s
Area-P 1/2: 3.91 cm2
MV M vel: 5.47 m/s
MV Peak grad: 119.7 mmHg
MV VTI: 1.62 cm2
P 1/2 time: 465 msec
Radius: 0.7 cm
S' Lateral: 4.8 cm

## 2020-12-16 LAB — URINALYSIS, ROUTINE W REFLEX MICROSCOPIC
Bilirubin Urine: NEGATIVE
Glucose, UA: NEGATIVE mg/dL
Hgb urine dipstick: NEGATIVE
Ketones, ur: NEGATIVE mg/dL
Leukocytes,Ua: NEGATIVE
Nitrite: NEGATIVE
Protein, ur: NEGATIVE mg/dL
Specific Gravity, Urine: 1.008 (ref 1.005–1.030)
pH: 7 (ref 5.0–8.0)

## 2020-12-16 LAB — HEMOGLOBIN A1C
Hgb A1c MFr Bld: 5.8 % — ABNORMAL HIGH (ref 4.8–5.6)
Mean Plasma Glucose: 119.76 mg/dL

## 2020-12-16 LAB — MRSA PCR SCREENING: MRSA by PCR: NEGATIVE

## 2020-12-16 LAB — TSH: TSH: 3.422 u[IU]/mL (ref 0.350–4.500)

## 2020-12-16 MED ORDER — GUAIFENESIN ER 600 MG PO TB12
600.0000 mg | ORAL_TABLET | Freq: Two times a day (BID) | ORAL | Status: DC
  Administered 2020-12-16 – 2020-12-19 (×7): 600 mg via ORAL
  Filled 2020-12-16 (×7): qty 1

## 2020-12-16 MED ORDER — IPRATROPIUM-ALBUTEROL 0.5-2.5 (3) MG/3ML IN SOLN
3.0000 mL | Freq: Four times a day (QID) | RESPIRATORY_TRACT | Status: DC
  Administered 2020-12-16 – 2020-12-17 (×5): 3 mL via RESPIRATORY_TRACT
  Filled 2020-12-16 (×6): qty 3

## 2020-12-16 MED ORDER — POTASSIUM CHLORIDE CRYS ER 20 MEQ PO TBCR
40.0000 meq | EXTENDED_RELEASE_TABLET | Freq: Two times a day (BID) | ORAL | Status: AC
  Administered 2020-12-16 – 2020-12-17 (×3): 40 meq via ORAL
  Filled 2020-12-16 (×3): qty 2

## 2020-12-16 MED ORDER — ISOSORBIDE MONONITRATE ER 30 MG PO TB24
30.0000 mg | ORAL_TABLET | Freq: Every day | ORAL | Status: DC
  Administered 2020-12-16 – 2020-12-27 (×12): 30 mg via ORAL
  Filled 2020-12-16 (×12): qty 1

## 2020-12-16 MED ORDER — SPIRONOLACTONE 25 MG PO TABS
25.0000 mg | ORAL_TABLET | Freq: Every day | ORAL | Status: DC
  Administered 2020-12-16 – 2020-12-23 (×8): 25 mg via ORAL
  Filled 2020-12-16 (×8): qty 1

## 2020-12-16 MED ORDER — HYDRALAZINE HCL 10 MG PO TABS
10.0000 mg | ORAL_TABLET | Freq: Three times a day (TID) | ORAL | Status: DC
  Administered 2020-12-16 – 2020-12-27 (×31): 10 mg via ORAL
  Filled 2020-12-16 (×32): qty 1

## 2020-12-16 MED ORDER — FUROSEMIDE 10 MG/ML IJ SOLN
20.0000 mg | Freq: Once | INTRAMUSCULAR | Status: AC
  Administered 2020-12-16: 20 mg via INTRAVENOUS
  Filled 2020-12-16: qty 2

## 2020-12-16 MED ORDER — FUROSEMIDE 10 MG/ML IJ SOLN
40.0000 mg | Freq: Two times a day (BID) | INTRAMUSCULAR | Status: DC
  Administered 2020-12-16 – 2020-12-18 (×4): 40 mg via INTRAVENOUS
  Filled 2020-12-16 (×4): qty 4

## 2020-12-16 NOTE — ED Notes (Signed)
I gave pt incentive spirometer and taught her how to use, pt was able to return demonstarated proper use of it. I instructed pt to use 20 time every hour. Pt was able to get up to 500 on it.

## 2020-12-16 NOTE — Procedures (Signed)
Echo attempted. Patient eating. Will attempt again later. 

## 2020-12-16 NOTE — Progress Notes (Signed)
  Echocardiogram 2D Echocardiogram has been performed.  [name] 12/16/2020, 2:09 PM

## 2020-12-16 NOTE — ED Notes (Signed)
Tele Breakfast order placed 

## 2020-12-16 NOTE — Consult Note (Addendum)
Cardiology Consultation:   Patient ID: [name]; [id number]; [birth year]   Admit date: 12/15/2020 Date of Consult: 12/16/2020  Primary Care Provider: Hulan Fess, MD Primary Cardiologist: No primary care provider on file. New Primary Electrophysiologist:  None   Patient Profile:   [name] is a 87 y.o. female with a hx of HTN, HLD, COPD, squamous cell skin CA (perianal), anxiety/depression, low Na+ 2nd HCTZ, who is being seen today for the evaluation of CHF at the request of Dr Tyrell Antonio.  History of Present Illness:   [name] has not seen cardiology before.  She does not know why CHF is listed in her diagnosis list (less specific dx removed).  She quit smoking about a month ago.  Because of Covid, she has rarely left the house in the last 2 years.  About all the walking she does is in the house and down her driveway which is 64-332 feet long.  She is able to walk all the way down the driveway without stopping, but is winded at the end.  This is relatively new, but she cannot say how long this has been going on.  Gradually, over the course of the last few weeks or months, she has noticed increasing dyspnea on exertion.  She feels the worsening of her breathing was so gradual, that she cannot really say how long this has been going on.  Over time, she would get more more short of breath when she tried to walk or do things.  She also began noticing orthopnea and possibly has PND.  She then started feeling short of breath at rest and that is what made her come to the hospital.  She has not had any chest pain or palpitations.  No presyncope or syncope.  She has not been following her blood pressure at home.  She does have a new blood pressure cuff that she states she will be able to use soon.  Although she has smoked for years, she does not ever remember hearing herself wheeze.  She has been coughing, but it is nonproductive.   Past Medical History:  Diagnosis Date  . Acute  combined systolic and diastolic CHF, NYHA class 4 ([place])   . Anxiety   . Arthritis    knees, hands   . Cancer (HCC)    SQUAMOuS CELL CARCINOMA OF SKIN- PERIANAL   . COPD (chronic obstructive pulmonary disease) ([place])    smoker  . Depression   . Elbow fracture, right 2013  . Hyperlipidemia   . Hypertension   . Osteoporosis   . Pneumonia    hx of   . S/P radiation therapy 04/08/2014-05/14/2014   Right perianal skin / 45 Gy in 25 fractions  . Wears glasses     Past Surgical History:  Procedure Laterality Date  . ABDOMINAL HYSTERECTOMY     TOTAL VAGINAL HYSTERECTOMY  . APPENDECTOMY    . CHOLECYSTECTOMY    . ELBOW FRACTURE SURGERY  2013   rt  . EVALUATION UNDER ANESTHESIA WITH HEMORRHOIDECTOMY N/A 05/29/2016   Procedure: EXAM UNDER ANESTHESIA WITH INTERNAL AND EXTERNAL HEMORRHOIDECTOMY;  Surgeon: Georganna Skeans, MD;  Location: [place];  Service: General;  Laterality: N/A;  . EYE SURGERY     left eye cataract surgery   . HEMORRHOID SURGERY N/A 02/06/2014   Procedure: EXCISION PERIANAL MASS;  Surgeon: Zenovia Jarred, MD;  Location: [place];  Service: General;  Laterality: N/A;  . HERNIA REPAIR  left inguinal hernia repair   . I & D EXTREMITY Left 03/05/2013   Procedure: Irrigation and Debridement Olecranon Bursa/Left Elbow;  Surgeon: Linna Hoff, MD;  Location: [place];  Service: Orthopedics;  Laterality: Left;  . JOINT REPLACEMENT     right and left hip replacement   . MASS EXCISION N/A 05/24/2015   Procedure: EXCISION ANAL MASS X 2;  Surgeon: Georganna Skeans, MD;  Location: [facility];  Service: General;  Laterality: N/A;  . OTHER SURGICAL HISTORY     arthroscopic right knee   . OTHER SURGICAL HISTORY     right carpal tunnel  . OTHER SURGICAL HISTORY     right elbow surgery   . OTHER SURGICAL HISTORY     oophorectomy 1958  . RECTAL BIOPSY N/A 05/29/2016   Procedure: ANAL BIOPSY;  Surgeon: Georganna Skeans, MD;  Location: [facility];  Service: General;  Laterality: N/A;  . TOTAL KNEE ARTHROPLASTY  12/13/2011   Procedure: TOTAL KNEE ARTHROPLASTY;  Surgeon: Tobi Bastos, MD;  Location: [facility];  Service: Orthopedics;  Laterality: Right;     Prior to Admission medications   Medication Sig Start Date End Date Taking? Authorizing Provider  alendronate (FOSAMAX) 70 MG tablet Take 70 mg by mouth once a week. 10/14/20  Yes [provider]  ALPRAZolam Duanne Moron) 0.25 MG tablet Take 0.25 mg by mouth 2 (two) times daily as needed for anxiety. For anxiety   Yes [provider]  docusate sodium (COLACE) 100 MG capsule Take 100 mg by mouth daily as needed. 03/07/13  Yes Iran Planas, MD  losartan (COZAAR) 50 MG tablet Take 50 mg by mouth daily. 02/23/14  Yes [provider]  lovastatin (MEVACOR) 40 MG tablet Take 40 mg by mouth every evening.   Yes [provider]  Multiple Vitamins-Minerals (OCUVITE PRESERVISION PO) Take by mouth 2 (two) times daily.   Yes [provider]  venlafaxine (EFFEXOR-XR) 75 MG 24 hr capsule Take 75 mg by mouth daily after breakfast.   Yes [provider]    Inpatient Medications: Scheduled Meds: . aspirin EC  81 mg Oral QHS  . enoxaparin (LOVENOX) injection  40 mg Subcutaneous Q24H  . furosemide  40 mg Intravenous Q12H  . guaiFENesin  600 mg Oral BID  . hydrALAZINE  10 mg Oral Q8H  . ipratropium-albuterol  3 mL Nebulization Q6H  . losartan  100 mg Oral Daily  . potassium chloride  40 mEq Oral BID  . pravastatin  40 mg Oral q1800  . sodium chloride flush  3 mL Intravenous Q12H  . spironolactone  25 mg Oral Daily  . venlafaxine XR  75 mg Oral QPC breakfast   Continuous Infusions: . sodium chloride     PRN Meds: sodium chloride, acetaminophen, ALPRAZolam, docusate sodium, hydrALAZINE, ondansetron (ZOFRAN) IV, sodium chloride flush  Allergies:    Allergies  Allergen Reactions  . Prolia [Denosumab] Other (See Comments)     Severe pain, could not fuction    Social History:   Social History   Socioeconomic History  . Marital status: Widowed    Spouse name: Not on file  . Number of children: Not on file  . Years of education: Not on file  . Highest education level: Not on file  Occupational History  . Not on file  Tobacco Use  . Smoking status: Former Smoker    Packs/day: 0.50    Years: 50.00    Pack years: 25.00  Types: Cigarettes    Quit date: 11/06/2020    Years since quitting: 0.1  . Smokeless tobacco: Never Used  Substance and Sexual Activity  . Alcohol use: No  . Drug use: No  . Sexual activity: Never  Other Topics Concern  . Not on file  Social History Narrative  . Not on file   Social Determinants of Health   Financial Resource Strain: Not on file  Food Insecurity: Not on file  Transportation Needs: Not on file  Physical Activity: Not on file  Stress: Not on file  Social Connections: Not on file  Intimate Partner Violence: Not on file    Family History:   Family History  Problem Relation Age of Onset  . Hypertension Mother   . Cancer Father        lung  . Arthritis Father   . Cancer Sister 55       colon   . Cancer Child 19       thyroid   Family Status:  Family Status  Relation Name Status  . Mother  Alive  . Father  Deceased  . Sister  Deceased at age 34  . Child daughter Alive    ROS:  Please see the history of present illness.  All other ROS reviewed and negative.     Physical Exam/Data:   Vitals:   12/16/20 1200 12/16/20 1400 12/16/20 1431 12/16/20 1520  BP: (!) 153/102 (!) 144/92  (!) 154/99  Pulse: 63 84  88  Resp: 14 (!) 24  20  Temp:   97.9 F (36.6 C) 97.7 F (36.5 C)  TempSrc:   Axillary Axillary  SpO2: 100% 100%  96%    Intake/Output Summary (Last 24 hours) at 12/16/2020 1710 Last data filed at 12/16/2020 1450 Gross per 24 hour  Intake --  Output 2400 ml  Net -2400 ml    Last 3 Weights 04/16/2018 10/05/2017 03/29/2017  Weight (lbs)  132 lb 3.2 oz 133 lb 130 lb 3.2 oz  Weight (kg) 59.966 kg 60.328 kg 59.058 kg     There is no height or weight on file to calculate BMI.   General:  Well nourished, well developed, female in no acute distress HEENT: normal Lymph: no adenopathy Neck: JVD -up to her jaw on the right, not as pronounced on the left Endocrine:  No thryomegaly Vascular: No carotid bruits; 4/4 extremity pulses 2+  Cardiac:  normal S1, S2; RRR; ?S3, no murmur Lungs:  clear bilaterally, no wheezing, rhonchi or rales  Abd: soft, nontender, no hepatomegaly  Ext: no edema Musculoskeletal:  No deformities, BUE and BLE strength normal and equal Skin: warm and dry  Neuro:  CNs 2-12 intact, no focal abnormalities noted Psych:  Normal affect   EKG:  The EKG was personally reviewed and demonstrates:  SR, HR 87, L axis deviation, incomplete BBB w/ QRS duration 98 ms Telemetry:  Telemetry was personally reviewed and demonstrates:  SR, PVCs   CV studies:   ECHO: 12/16/2020 (no old in system) 1. Left ventricular ejection fraction, by estimation, is 25 to 30%. Left  ventricular ejection fraction by 3D volume is 31 %. The left ventricle has  severely decreased function. The left ventricle demonstrates global  hypokinesis. There is mild left ventricular hypertrophy. Left ventricular diastolic parameters are consistent with Grade I diastolic dysfunction (impaired relaxation).  2. Right ventricular systolic function is normal. The right ventricular  size is normal. There is mildly elevated pulmonary artery systolic  pressure.  3. Left atrial size was severely dilated.  4. The mitral valve is degenerative. Moderate mitral valve regurgitation.  No evidence of mitral stenosis. Moderate mitral annular calcification.  5. The aortic valve is calcified. Aortic valve regurgitation is trivial.  Mild aortic valve sclerosis is present, with no evidence of aortic valve  stenosis.  6. Aortic dilatation noted. There is mild  dilatation of the ascending  aorta, measuring 39 mm.  7. The inferior vena cava is normal in size with greater than 50%  respiratory variability, suggesting right atrial pressure of 3 mmHg.   Laboratory Data:   Chemistry Recent Labs  Lab 12/15/20 1514 12/16/20 0423  NA 146* 144  K 2.7* 3.5  CL 104 104  CO2 30 28  GLUCOSE 147* 122*  BUN 10 12  CREATININE 0.91 0.88  CALCIUM 9.2 9.0  GFRNONAA >60 >60  ANIONGAP 12 12    Lab Results  Component Value Date   ALT 20 03/29/2017   AST 28 03/29/2017   ALKPHOS 69 03/29/2017   BILITOT 0.31 03/29/2017   Hematology Recent Labs  Lab 12/15/20 1514  WBC 9.7  RBC 4.88  HGB 15.0  HCT 48.0*  MCV 98.4  MCH 30.7  MCHC 31.3  RDW 15.8*  PLT 171   Cardiac Enzymes High Sensitivity Troponin:   Recent Labs  Lab 12/15/20 1514  TROPONINIHS 58*      BNP Recent Labs  Lab 12/15/20 1514  BNP 3,235.0*    TSH:  Lab Results  Component Value Date   TSH 3.422 12/15/2020   Lipids:No results found for: CHOL, HDL, LDLCALC, LDLDIRECT, TRIG, CHOLHDL HgbA1c: Lab Results  Component Value Date   HGBA1C 5.8 (H) 12/16/2020   Magnesium:  Magnesium  Date Value Ref Range Status  12/15/2020 2.0 1.7 - 2.4 mg/dL Final    Comment:    Performed at [facility], [place] [address] [place], [place] [address]     Radiology/Studies:  DG Chest 2 View  Result Date: 12/15/2020 CLINICAL DATA:  87 year old female with shortness of breath. EXAM: CHEST - 2 VIEW COMPARISON:  Chest radiograph dated 03/05/2013 FINDINGS: Small bilateral pleural effusions, left greater right with associated bibasilar compressive atelectasis. Pneumonia is not excluded clinical correlation is recommended. There is diffuse interstitial and vascular prominence consistent with edema. No pneumothorax. There is cardiomegaly. Atherosclerotic calcification of the aorta. Osteopenia with scoliosis and degenerative changes of the spine. A 16 mm ovoid radiopaque focus over the spine  on the lateral view is not identified on the frontal projection. No acute osseous pathology. IMPRESSION: CHF with small bilateral pleural effusions and bibasilar atelectasis. Pneumonia is not excluded. Electronically Signed   By: Anner Crete M.D.   On: 12/15/2020 15:56   DG Chest Port 1 View  Result Date: 12/16/2020 CLINICAL DATA:  CHF EXAM: PORTABLE CHEST 1 VIEW COMPARISON:  Radiograph 12/15/2020, CT 04/30/2018 FINDINGS: Some increasing hazy interstitial opacities throughout the lungs on a background of more chronic interstitial and bronchitic change with basilar scarring. Some more dense opacity is noted in gradient fashion towards the lung bases could reflect developing alveolar edema or layering pleural effusion. No pneumothorax. Cardiomegaly is similar to priors with a calcified aorta. No acute osseous or soft tissue abnormality. Degenerative changes are present in the imaged spine and shoulders. Dextrocurvature of the thoracic levels is similar to prior studies. Telemetry leads overlie the chest. IMPRESSION: 1. Increasing hazy interstitial opacities throughout the lungs likely reflecting edema on a background of more chronic interstitial and  bronchitic change with basilar scarring. 2. More dense opacity in gradient towards the lung bases could reflect developing alveolar edema or layering pleural effusion. Infection less favored in the absence of clinical symptoms. Electronically Signed   By: Lovena Le M.D.   On: 12/16/2020 05:06   ECHOCARDIOGRAM COMPLETE  Result Date: 12/16/2020    ECHOCARDIOGRAM REPORT   Patient Name:   PAULETTE ROCKFORD Date of Exam: 12/16/2020 Medical Rec #:  [id number]      Height:       59.0 in Accession #:    [id number]     Weight:       132.2 lb Date of Birth:  [birth year]      BSA:          1.547 m Patient Age:    59 years       BP:           153/102 mmHg Patient Gender: F              HR:           79 bpm. Exam Location:  Inpatient Procedure: 2D Echo, Color Doppler and  Cardiac Doppler Indications:    CHF-Acute Diastolic  History:        Patient has prior history of Echocardiogram examinations. COPD,                 Signs/Symptoms:Shortness of Breath; Risk Factors:Hypertension.  Sonographer:    Clayton Lefort RDCS (AE) Referring Phys: [id number] [place]  1. Left ventricular ejection fraction, by estimation, is 25 to 30%. Left ventricular ejection fraction by 3D volume is 31 %. The left ventricle has severely decreased function. The left ventricle demonstrates global hypokinesis. There is mild left ventricular hypertrophy. Left ventricular diastolic parameters are consistent with Grade I diastolic dysfunction (impaired relaxation).  2. Right ventricular systolic function is normal. The right ventricular size is normal. There is mildly elevated pulmonary artery systolic pressure.  3. Left atrial size was severely dilated.  4. The mitral valve is degenerative. Moderate mitral valve regurgitation. No evidence of mitral stenosis. Moderate mitral annular calcification.  5. The aortic valve is calcified. Aortic valve regurgitation is trivial. Mild aortic valve sclerosis is present, with no evidence of aortic valve stenosis.  6. Aortic dilatation noted. There is mild dilatation of the ascending aorta, measuring 39 mm.  7. The inferior vena cava is normal in size with greater than 50% respiratory variability, suggesting right atrial pressure of 3 mmHg. FINDINGS  Left Ventricle: Left ventricular ejection fraction, by estimation, is 25 to 30%. Left ventricular ejection fraction by 3D volume is 31 %. The left ventricle has severely decreased function. The left ventricle demonstrates global hypokinesis. The left ventricular internal cavity size was normal in size. There is mild left ventricular hypertrophy. Left ventricular diastolic parameters are consistent with Grade I diastolic dysfunction (impaired relaxation). Right Ventricle: The right ventricular size is normal. No increase in  right ventricular wall thickness. Right ventricular systolic function is normal. There is mildly elevated pulmonary artery systolic pressure. The tricuspid regurgitant velocity is 2.66  m/s, and with an assumed right atrial pressure of 8 mmHg, the estimated right ventricular systolic pressure is 48.5 mmHg. Left Atrium: Left atrial size was severely dilated. Right Atrium: Right atrial size was normal in size. Pericardium: There is no evidence of pericardial effusion. Mitral Valve: The mitral valve is degenerative in appearance. There is moderate thickening of the mitral valve leaflet(s). There is moderate calcification of  the mitral valve leaflet(s). Moderate mitral annular calcification. Moderate mitral valve regurgitation. No evidence of mitral valve stenosis. MV peak gradient, 4.2 mmHg. The mean mitral valve gradient is 1.0 mmHg. Tricuspid Valve: The tricuspid valve is normal in structure. Tricuspid valve regurgitation is trivial. No evidence of tricuspid stenosis. Aortic Valve: The aortic valve is calcified. Aortic valve regurgitation is trivial. Aortic regurgitation PHT measures 465 msec. Mild aortic valve sclerosis is present, with no evidence of aortic valve stenosis. Aortic valve mean gradient measures 4.0 mmHg. Aortic valve peak gradient measures 7.7 mmHg. Aortic valve area, by VTI measures 1.93 cm. Pulmonic Valve: The pulmonic valve was normal in structure. Pulmonic valve regurgitation is not visualized. No evidence of pulmonic stenosis. Aorta: The aortic root is normal in size and structure and aortic dilatation noted. There is mild dilatation of the ascending aorta, measuring 39 mm. Venous: The inferior vena cava is normal in size with greater than 50% respiratory variability, suggesting right atrial pressure of 3 mmHg. IAS/Shunts: No atrial level shunt detected by color flow Doppler.  LEFT VENTRICLE PLAX 2D LVIDd:         5.20 cm         Diastology LVIDs:         4.80 cm         LV e' medial:    4.24  cm/s LV PW:         1.60 cm         LV E/e' medial:  17.7 LV IVS:        1.30 cm         LV e' lateral:   4.79 cm/s LVOT diam:     2.20 cm         LV E/e' lateral: 15.7 LV SV:         44 LV SV Index:   28 LVOT Area:     3.80 cm        3D Volume EF                                LV 3D EF:    Left                                             ventricular                                             ejection                                             fraction by                                             3D volume  is 31 %.                                 3D Volume EF:                                3D EF:        31 %                                LV EDV:       183 ml                                LV ESV:       126 ml                                LV SV:        57 ml RIGHT VENTRICLE            IVC RV Basal diam:  3.30 cm    IVC diam: 1.30 cm RV S prime:     9.57 cm/s TAPSE (M-mode): 2.3 cm LEFT ATRIUM             Index       RIGHT ATRIUM           Index LA diam:        3.10 cm 2.00 cm/m  RA Area:     14.30 cm LA Vol (A2C):   90.0 ml 58.18 ml/m RA Volume:   30.70 ml  19.85 ml/m LA Vol (A4C):   55.4 ml 35.82 ml/m LA Biplane Vol: 71.1 ml 45.97 ml/m  AORTIC VALVE AV Area (Vmax):    1.94 cm AV Area (Vmean):   2.03 cm AV Area (VTI):     1.93 cm AV Vmax:           139.00 cm/s AV Vmean:          93.200 cm/s AV VTI:            0.226 m AV Peak Grad:      7.7 mmHg AV Mean Grad:      4.0 mmHg LVOT Vmax:         71.00 cm/s LVOT Vmean:        49.800 cm/s LVOT VTI:          0.115 m LVOT/AV VTI ratio: 0.51 AI PHT:            465 msec  AORTA Ao Root diam: 3.10 cm Ao Asc diam:  3.90 cm MITRAL VALVE                 TRICUSPID VALVE MV Area (PHT): 3.91 cm      TR Peak grad:   28.3 mmHg MV Area VTI:   1.62 cm      TR Vmax:        266.00 cm/s MV Peak grad:  4.2 mmHg MV Mean grad:  1.0 mmHg      SHUNTS MV Vmax:       1.02 m/s      Systemic VTI:  0.12 m MV Vmean:      49.3 cm/s  Systemic Diam: 2.20 cm MV Decel Time: 194 msec MR Peak grad:    119.7 mmHg MR Mean grad:    65.0 mmHg MR Vmax:         547.00 cm/s MR Vmean:        369.0 cm/s MR PISA:         3.08 cm MR PISA Eff ROA: 22 mm MR PISA Radius:  0.70 cm MV E velocity: 75.00 cm/s MV A velocity: 81.80 cm/s MV E/A ratio:  0.92 Candee Furbish MD Electronically signed by Candee Furbish MD Signature Date/Time: 12/16/2020/2:19:56 PM    Final    VAS [place] LOWER EXTREMITY VENOUS (DVT) (ONLY [facility])  Result Date: 12/15/2020  Lower Venous DVT Study Indications: Swelling.  Risk Factors: None identified. Comparison Study: No prior studies. Performing Technologist: Oliver Hum RVT  Examination Guidelines: A complete evaluation includes B-mode imaging, spectral Doppler, color Doppler, and power Doppler as needed of all accessible portions of each vessel. Bilateral testing is considered an integral part of a complete examination. Limited examinations for reoccurring indications may be performed as noted. The reflux portion of the exam is performed with the patient in reverse Trendelenburg.  +-----+---------------+---------+-----------+----------+--------------+ RIGHTCompressibilityPhasicitySpontaneityPropertiesThrombus Aging +-----+---------------+---------+-----------+----------+--------------+ CFV  Full           Yes      Yes                                 +-----+---------------+---------+-----------+----------+--------------+   +---------+---------------+---------+-----------+----------+--------------+ LEFT     CompressibilityPhasicitySpontaneityPropertiesThrombus Aging +---------+---------------+---------+-----------+----------+--------------+ CFV      Full           Yes      Yes                                 +---------+---------------+---------+-----------+----------+--------------+ SFJ      Full                                                         +---------+---------------+---------+-----------+----------+--------------+ FV Prox  Full                                                        +---------+---------------+---------+-----------+----------+--------------+ FV Mid   Full                                                        +---------+---------------+---------+-----------+----------+--------------+ FV DistalFull                                                        +---------+---------------+---------+-----------+----------+--------------+ PFV      Full                                                        +---------+---------------+---------+-----------+----------+--------------+  POP      Full           Yes      Yes                                 +---------+---------------+---------+-----------+----------+--------------+ PTV      Full                                                        +---------+---------------+---------+-----------+----------+--------------+ PERO     Full                                                        +---------+---------------+---------+-----------+----------+--------------+     Summary: RIGHT: - No evidence of common femoral vein obstruction.  LEFT: - There is no evidence of deep vein thrombosis in the lower extremity.  - No cystic structure found in the popliteal fossa.  *See table(s) above for measurements and observations.    Preliminary     Assessment and Plan:   1.  Acute combined systolic and diastolic CHF, class IV -Patient came in short of breath at rest, requires oxygen  -Volume overload by exam, currently started on Lasix 40 mg IV twice daily -Not on Lasix PTA - reported history of hyponatremia on HCTZ, but suspect this was actually hypokalemia. -Agree with current Lasix dose and aggressive potassium supplementation as well -Will add spironolactone to help reduce potassium loss -Follow strict intake/output, daily weights and daily BMET  2.   Cardiomyopathy, type unknown -She is currently on losartan and has been started on low-dose hydralazine -Hold off on beta-blocker until volume status is improved -However, will add nitrates -Once she has diuresed, consider changing losartan to [facility] prior to discharge -Will need ischemic eval, right/left heart cath once volume status improved -Will make n.p.o. after midnight in case she is improved enough to do this tomorrow otherwise, Monday  3.  Probable COPD, history of tobacco use -She denies hearing herself wheeze, quit smoking about a month ago. -However, it is entirely possible that some of her dyspnea on exertion is related to COPD in addition to the heart failure. -Recheck chest x-ray prior to discharge to get a baseline  4.  Hypertension: -Blood pressures very hard to control here, she has not been checking it at home -Only outpatient BP med was low start 50 mg daily -Losartan has been increased to 100, I have added spironolactone, IM has added hydralazine, we will add nitrates -Check renin aldosterone panel -MD advise on renal artery ultrasound -Continue to follow blood pressure closely    Principal Problem:   Acute combined systolic and diastolic CHF, NYHA class 4 (HCC) Active Problems:   Hypertension   Cardiomyopathy ([place]), type unknown   COPD suggested by initial evaluation [facility])     For questions or updates, please contact [facility] Please consult [email or web address] for contact info under Cardiology/STEMI.   Signed, Rosaria Ferries, PA-C  12/16/2020 5:10 PM  Patient seen and examined.  Agree with above documentation.  Ms. Boling is an 87 year old female with a history of hypertension, tobacco use, COPD, hyperlipidemia, hypokalemia  who we are consulted by Dr. Tyrell Antonio for evaluation of heart failure.  She reports she has been having progressive dyspnea on exertion.  Denies any chest pain.  Given her symptoms, prompted her to come to the ED yesterday.  Initial  vital signs notable for BP 150/94, pulse 78, SPO2 92% on room air.  Labs notable for sodium 146, potassium 2.7, WBC 9.7, hemoglobin 15.0, platelets 171, BNP 3235, troponin 58.  EKG shows sinus rhythm, rate 87, LVH, poor R wave progression.  Chest x-ray showed diffuse interstitial and vascular prominence consistent with edema, small bilateral pleural effusions.  Lower extremity duplex showed no DVT.  COVID-19 negative.  Echocardiogram today shows LVEF 25 to 60%, grade 1 diastolic dysfunction, normal RV function, severe left atrial dilatation, moderate MR. Telemetry shows normal sinus rhythm with rate 80s to 90s, PVCs.  On exam, patient is alert and oriented, regular rate and rhythm, no murmurs, diminished breath sounds, no LE edema, + JVD.  For her acute combined systolic and diastolic heart failure, would continue IV diuresis.  She has been notably hypokalemic down to potassium 2.7 on admission.  Will need to aggressively replete potassium while diuresing.  Will also start spironolactone, which may help with her potassium.  Given her significantly elevated blood pressures and hypokalemia, would consider hyperaldosteronism.  Will check renin/aldosterone levels.  She has been started on losartan and hydralazine.  We will add Imdur.  Hold off on beta-blocker given decompensated heart failure.  We will plan ischemic evaluation with RHC/LHC.  I tried lying her flat but she felt short of breath.  We will need to diurese further before proceeding with catheterization.  Donato Heinz, MD

## 2020-12-16 NOTE — ED Notes (Signed)
Pts daughter, [name], would like an update when patient gets assigned a room.

## 2020-12-16 NOTE — Evaluation (Signed)
Physical Therapy Evaluation Patient Details Name: [name] MRN: [id number] DOB: 1934-06-25 Today's Date: 12/16/2020   History of Present Illness  Pt is an 87 y/o female admitted secondary to SOB and BLE swelling. Thought to be secondary to acute CHF. PMH includes HTN and COPD.  Clinical Impression  Pt admitted secondary to problem above with deficits below. Pt requiring min guard A for short distance mobility in the room. Oxygen sats WFL on 3L throughout mobility tasks. Anticipate pt will progress well and be able to d/c home with HHPT. Will continue to follow acutely.     Follow Up Recommendations Home health PT;Supervision for mobility/OOB    Equipment Recommendations  None recommended by PT    Recommendations for Other Services       Precautions / Restrictions Precautions Precautions: Fall Restrictions Weight Bearing Restrictions: No      Mobility  Bed Mobility Overal bed mobility: Needs Assistance Bed Mobility: Supine to Sit;Sit to Supine     Supine to sit: Supervision Sit to supine: Supervision   General bed mobility comments: Supervision for safety.    Transfers Overall transfer level: Needs assistance Equipment used: Rolling walker (2 wheeled) Transfers: Sit to/from Stand Sit to Stand: Min guard         General transfer comment: Min guard for safety.  Ambulation/Gait Ambulation/Gait assistance: Min guard Gait Distance (Feet): 5 Feet Assistive device: Rolling walker (2 wheeled) Gait Pattern/deviations: Step-through pattern;Decreased stride length Gait velocity: Decreased   General Gait Details: Ambulated short distance in ED room. Pt connected to lines and oxygen which limited distance. Min guard for safey. No LOB noted. Oxygen sats WFL on 3L.  Stairs            Wheelchair Mobility    Modified Rankin (Stroke Patients Only)       Balance Overall balance assessment: Needs assistance Sitting-balance support: Feet supported;No upper  extremity supported Sitting balance-Leahy Scale: Good     Standing balance support: Bilateral upper extremity supported;During functional activity Standing balance-Leahy Scale: Poor Standing balance comment: Reliant on BUE support                             Pertinent Vitals/Pain Pain Assessment: No/denies pain    Home Living Family/patient expects to be discharged to:: Private residence Living Arrangements: Alone Available Help at Discharge: Family;Available PRN/intermittently Type of Home: House Home Access: Stairs to enter Entrance Stairs-Rails: None Entrance Stairs-Number of Steps: 3 Home Layout: One level Home Equipment: Walker - 2 wheels;Shower seat      Prior Function Level of Independence: Independent with assistive device(s)         Comments: Uses RW for ambulation     Hand Dominance        Extremity/Trunk Assessment   Upper Extremity Assessment Upper Extremity Assessment: Overall WFL for tasks assessed    Lower Extremity Assessment Lower Extremity Assessment: Generalized weakness    Cervical / Trunk Assessment Cervical / Trunk Assessment: Normal  Communication   Communication: No difficulties  Cognition Arousal/Alertness: Awake/alert Behavior During Therapy: WFL for tasks assessed/performed Overall Cognitive Status: Within Functional Limits for tasks assessed                                        General Comments General comments (skin integrity, edema, etc.): Oxygen sats >90% on 3L throughout.  Exercises     Assessment/Plan    PT Assessment Patient needs continued PT services  PT Problem List Decreased strength;Decreased balance;Decreased mobility;Decreased knowledge of use of DME;Decreased knowledge of precautions;Decreased activity tolerance       PT Treatment Interventions Gait training;DME instruction;Functional mobility training;Stair training;Therapeutic activities;Therapeutic exercise;Balance  training;Patient/family education    PT Goals (Current goals can be found in the Care Plan section)  Acute Rehab PT Goals Patient Stated Goal: to feel better PT Goal Formulation: With patient Time For Goal Achievement: 12/30/20 Potential to Achieve Goals: Good    Frequency Min 3X/week   Barriers to discharge        Co-evaluation               AM-PAC PT "6 Clicks" Mobility  Outcome Measure Help needed turning from your back to your side while in a flat bed without using bedrails?: None Help needed moving from lying on your back to sitting on the side of a flat bed without using bedrails?: A Little Help needed moving to and from a bed to a chair (including a wheelchair)?: A Little Help needed standing up from a chair using your arms (e.g., wheelchair or bedside chair)?: A Little Help needed to walk in hospital room?: A Little Help needed climbing 3-5 steps with a railing? : A Little 6 Click Score: 19    End of Session Equipment Utilized During Treatment: Gait belt Activity Tolerance: Patient tolerated treatment well Patient left: in bed;with call bell/phone within reach (on stretcher in ED) Nurse Communication: Mobility status PT Visit Diagnosis: Other abnormalities of gait and mobility (R26.89)    Time: 7619-5093 PT Time Calculation (min) (ACUTE ONLY): 17 min   Charges:   PT Evaluation $PT Eval Low Complexity: 1 Low          Lou Miner, DPT  Acute Rehabilitation Services  Pager: [phone] Office: [phone]   Rudean Hitt 12/16/2020, 2:50 PM

## 2020-12-16 NOTE — ED Notes (Signed)
Update given to daughter, [name].

## 2020-12-16 NOTE — Progress Notes (Addendum)
PROGRESS NOTE    [name]  BMW:[id number] DOB: 25-Dec-1933 DOA: 12/15/2020 PCP: Hulan Fess, MD   Brief Narrative: 87 year old with past medical history significant for hypertension, hyponatremia secondary to hydrochlorothiazide, COPD, anxiety depression who presented with new onset of shortness of breath.  Patient reports her symptoms started 4 to 5 days prior to admission, she initially started with exertional dyspnea, since the day of admission her shortness of breath is constant.  Presents with significantly elevated blood pressure, fluid overload, chest x-ray with bilateral pleural effusion, potassium 2.7.   Assessment & Plan:   Active Problems:   Acute CHF (congestive heart failure) (HCC)   CHF (congestive heart failure) (HCC)  1-Acute systolic and Diastolic Heart Failure Exacerbation: -1.3 L urine out put.  -Chest x-ray: With increase opacity could reflect pulmonary edema. BNP; 3235. -Continue with IV lasix 40 mg IV BID.  -Weight:  -Negative 1. 3 L.  -ECHO: Ef 25 %, diastolic dysfunction grade one, Mitral Valve regurgitation.  -Will consult cardiology   Hypokalemia: Replete orally.   Hyperetension: Continue with Cozaar and lasix.  Will add hydrazine schedule. Marland Kitchen   COPD: Schedule nebulizer.   HLD: Continue with Pravachol.   Elevated Blood sugar:  HbA1c; 5.8  Estimated body mass index is 26.7 kg/m as calculated from the following:   Height as of 04/16/18: 4\' 11"  (1.499 m).   Weight as of 04/16/18: 60 kg.   DVT prophylaxis: Lovenox Code Status: Full code Family Communication: care discussed with patient. Will update family later.  Disposition Plan:  Status is: Inpatient  Remains inpatient appropriate because:IV treatments appropriate due to intensity of illness or inability to take PO   Dispo: The patient is from: Home              Anticipated d/c is to: to be determine              Anticipated d/c date is: 2 days              Patient currently is  not medically stable to d/c.   Difficult to place patient No        Consultants:   None  Procedures:   ECHO  Antimicrobials:    Subjective: She denies worsening dyspnea. Report dry cough.   Objective: Vitals:   12/16/20 0700 12/16/20 0715 12/16/20 0730 12/16/20 0740  BP: (!) 115/103 138/75 (!) 149/90   Pulse: 60 62 80   Resp: 18 15 20    Temp:    97.7 F (36.5 C)  TempSrc:    Oral  SpO2: 96% 98% 97%     Intake/Output Summary (Last 24 hours) at 12/16/2020 2725 Last data filed at 12/15/2020 2200 Gross per 24 hour  Intake --  Output 1300 ml  Net -1300 ml   There were no vitals filed for this visit.  Examination:  General exam: Appears calm and comfortable  Respiratory system: B/L crackles.  Cardiovascular system: S1 & S2 heard, RRR. Gastrointestinal system: Abdomen is nondistended, soft and nontender. No organomegaly or masses felt. Normal bowel sounds heard. Central nervous system: Alert and oriented. No focal neurological deficits. Extremities: plus 1 edema   Data Reviewed: I have personally reviewed following labs and imaging studies  CBC: Recent Labs  Lab 12/15/20 1514  WBC 9.7  HGB 15.0  HCT 48.0*  MCV 98.4  PLT 366   Basic Metabolic Panel: Recent Labs  Lab 12/15/20 1514 12/16/20 0423  NA 146* 144  K 2.7* 3.5  CL 104  104  CO2 30 28  GLUCOSE 147* 122*  BUN 10 12  CREATININE 0.91 0.88  CALCIUM 9.2 9.0  MG 2.0  --    GFR: CrCl cannot be calculated (Unknown ideal weight.). Liver Function Tests: No results for input(s): AST, ALT, ALKPHOS, BILITOT, PROT, ALBUMIN in the last 168 hours. No results for input(s): LIPASE, AMYLASE in the last 168 hours. No results for input(s): AMMONIA in the last 168 hours. Coagulation Profile: No results for input(s): INR, PROTIME in the last 168 hours. Cardiac Enzymes: No results for input(s): CKTOTAL, CKMB, CKMBINDEX, TROPONINI in the last 168 hours. BNP (last 3 results) No results for input(s):  PROBNP in the last 8760 hours. HbA1C: Recent Labs    12/16/20 0423  HGBA1C 5.8*   CBG: No results for input(s): GLUCAP in the last 168 hours. Lipid Profile: No results for input(s): CHOL, HDL, LDLCALC, TRIG, CHOLHDL, LDLDIRECT in the last 72 hours. Thyroid Function Tests: Recent Labs    12/15/20 2010  TSH 3.422   Anemia Panel: No results for input(s): VITAMINB12, FOLATE, FERRITIN, TIBC, IRON, RETICCTPCT in the last 72 hours. Sepsis Labs: No results for input(s): PROCALCITON, LATICACIDVEN in the last 168 hours.  Recent Results (from the past 240 hour(s))  Resp Panel by RT-PCR (Flu A&B, Covid) Nasopharyngeal Swab     Status: None   Collection Time: 12/15/20  6:13 PM   Specimen: Nasopharyngeal Swab; Nasopharyngeal(NP) swabs in vial transport medium  Result Value Ref Range Status   SARS Coronavirus 2 by RT PCR NEGATIVE NEGATIVE Final    Comment: (NOTE) SARS-CoV-2 target nucleic acids are NOT DETECTED.  The SARS-CoV-2 RNA is generally detectable in upper respiratory specimens during the acute phase of infection. The lowest concentration of SARS-CoV-2 viral copies this assay can detect is 138 copies/mL. A negative result does not preclude SARS-Cov-2 infection and should not be used as the sole basis for treatment or other patient management decisions. A negative result may occur with  improper specimen collection/handling, submission of specimen other than nasopharyngeal swab, presence of viral mutation(s) within the areas targeted by this assay, and inadequate number of viral copies(<138 copies/mL). A negative result must be combined with clinical observations, patient history, and epidemiological information. The expected result is Negative.  Fact Sheet for Patients:  EntrepreneurPulse.com.au  Fact Sheet for Healthcare Providers:  IncredibleEmployment.be  This test is no t yet approved or cleared by the [place] FDA and  has been  authorized for detection and/or diagnosis of SARS-CoV-2 by FDA under an Emergency Use Authorization (EUA). This EUA will remain  in effect (meaning this test can be used) for the duration of the COVID-19 declaration under Section 564(b)(1) of the Act, 21 U.S.C.section 360bbb-3(b)(1), unless the authorization is terminated  or revoked sooner.       Influenza A by PCR NEGATIVE NEGATIVE Final   Influenza B by PCR NEGATIVE NEGATIVE Final    Comment: (NOTE) The Xpert Xpress SARS-CoV-2/FLU/RSV plus assay is intended as an aid in the diagnosis of influenza from Nasopharyngeal swab specimens and should not be used as a sole basis for treatment. Nasal washings and aspirates are unacceptable for Xpert Xpress SARS-CoV-2/FLU/RSV testing.  Fact Sheet for Patients: EntrepreneurPulse.com.au  Fact Sheet for Healthcare Providers: IncredibleEmployment.be  This test is not yet approved or cleared by the [place] FDA and has been authorized for detection and/or diagnosis of SARS-CoV-2 by FDA under an Emergency Use Authorization (EUA). This EUA will remain in effect (meaning this test can be used)  for the duration of the COVID-19 declaration under Section 564(b)(1) of the Act, 21 U.S.C. section 360bbb-3(b)(1), unless the authorization is terminated or revoked.  Performed at [facility], [place] [address] [place],  [address]          Radiology Studies: DG Chest 2 View  Result Date: 12/15/2020 CLINICAL DATA:  87 year old female with shortness of breath. EXAM: CHEST - 2 VIEW COMPARISON:  Chest radiograph dated 03/05/2013 FINDINGS: Small bilateral pleural effusions, left greater right with associated bibasilar compressive atelectasis. Pneumonia is not excluded clinical correlation is recommended. There is diffuse interstitial and vascular prominence consistent with edema. No pneumothorax. There is cardiomegaly. Atherosclerotic calcification of  the aorta. Osteopenia with scoliosis and degenerative changes of the spine. A 16 mm ovoid radiopaque focus over the spine on the lateral view is not identified on the frontal projection. No acute osseous pathology. IMPRESSION: CHF with small bilateral pleural effusions and bibasilar atelectasis. Pneumonia is not excluded. Electronically Signed   By: Anner Crete M.D.   On: 12/15/2020 15:56   DG Chest Port 1 View  Result Date: 12/16/2020 CLINICAL DATA:  CHF EXAM: PORTABLE CHEST 1 VIEW COMPARISON:  Radiograph 12/15/2020, CT 04/30/2018 FINDINGS: Some increasing hazy interstitial opacities throughout the lungs on a background of more chronic interstitial and bronchitic change with basilar scarring. Some more dense opacity is noted in gradient fashion towards the lung bases could reflect developing alveolar edema or layering pleural effusion. No pneumothorax. Cardiomegaly is similar to priors with a calcified aorta. No acute osseous or soft tissue abnormality. Degenerative changes are present in the imaged spine and shoulders. Dextrocurvature of the thoracic levels is similar to prior studies. Telemetry leads overlie the chest. IMPRESSION: 1. Increasing hazy interstitial opacities throughout the lungs likely reflecting edema on a background of more chronic interstitial and bronchitic change with basilar scarring. 2. More dense opacity in gradient towards the lung bases could reflect developing alveolar edema or layering pleural effusion. Infection less favored in the absence of clinical symptoms. Electronically Signed   By: Lovena Le M.D.   On: 12/16/2020 05:06   VAS [place] LOWER EXTREMITY VENOUS (DVT) (ONLY [facility] & [facility])  Result Date: 12/15/2020  Lower Venous DVT Study Indications: Swelling.  Risk Factors: None identified. Comparison Study: No prior studies. Performing Technologist: Oliver Hum RVT  Examination Guidelines: A complete evaluation includes B-mode imaging, spectral Doppler, color Doppler, and  power Doppler as needed of all accessible portions of each vessel. Bilateral testing is considered an integral part of a complete examination. Limited examinations for reoccurring indications may be performed as noted. The reflux portion of the exam is performed with the patient in reverse Trendelenburg.  +-----+---------------+---------+-----------+----------+--------------+ RIGHTCompressibilityPhasicitySpontaneityPropertiesThrombus Aging +-----+---------------+---------+-----------+----------+--------------+ CFV  Full           Yes      Yes                                 +-----+---------------+---------+-----------+----------+--------------+   +---------+---------------+---------+-----------+----------+--------------+ LEFT     CompressibilityPhasicitySpontaneityPropertiesThrombus Aging +---------+---------------+---------+-----------+----------+--------------+ CFV      Full           Yes      Yes                                 +---------+---------------+---------+-----------+----------+--------------+ SFJ      Full                                                        +---------+---------------+---------+-----------+----------+--------------+  FV Prox  Full                                                        +---------+---------------+---------+-----------+----------+--------------+ FV Mid   Full                                                        +---------+---------------+---------+-----------+----------+--------------+ FV DistalFull                                                        +---------+---------------+---------+-----------+----------+--------------+ PFV      Full                                                        +---------+---------------+---------+-----------+----------+--------------+ POP      Full           Yes      Yes                                  +---------+---------------+---------+-----------+----------+--------------+ PTV      Full                                                        +---------+---------------+---------+-----------+----------+--------------+ PERO     Full                                                        +---------+---------------+---------+-----------+----------+--------------+     Summary: RIGHT: - No evidence of common femoral vein obstruction.  LEFT: - There is no evidence of deep vein thrombosis in the lower extremity.  - No cystic structure found in the popliteal fossa.  *See table(s) above for measurements and observations.    Preliminary         Scheduled Meds: . aspirin EC  81 mg Oral QHS  . enoxaparin (LOVENOX) injection  40 mg Subcutaneous Q24H  . furosemide  20 mg Intravenous Q12H  . losartan  100 mg Oral Daily  . pravastatin  40 mg Oral q1800  . sodium chloride flush  3 mL Intravenous Q12H  . venlafaxine XR  75 mg Oral QPC breakfast   Continuous Infusions: . sodium chloride       LOS: 1 day    Time spent: 35 minutes.     Elmarie Shiley, MD [facility]   If 7PM-7AM, please contact night-coverage [email or web address]  12/16/2020, 8:23 AM

## 2020-12-16 NOTE — ED Notes (Signed)
Attempted to give reportx1 

## 2020-12-17 ENCOUNTER — Encounter (HOSPITAL_COMMUNITY)
Admission: EM | Disposition: A | Discharge status: Skilled Nursing Facility | Payer: Self-pay | Source: Home / Self Care | Attending: Internal Medicine

## 2020-12-17 DIAGNOSIS — I5041 Acute combined systolic (congestive) and diastolic (congestive) heart failure: Secondary | ICD-10-CM | POA: Diagnosis not present

## 2020-12-17 DIAGNOSIS — I10 Essential (primary) hypertension: Secondary | ICD-10-CM | POA: Diagnosis not present

## 2020-12-17 DIAGNOSIS — E876 Hypokalemia: Secondary | ICD-10-CM | POA: Diagnosis not present

## 2020-12-17 DIAGNOSIS — I5043 Acute on chronic combined systolic (congestive) and diastolic (congestive) heart failure: Secondary | ICD-10-CM | POA: Diagnosis not present

## 2020-12-17 HISTORY — PX: RIGHT/LEFT HEART CATH AND CORONARY ANGIOGRAPHY: CATH118266

## 2020-12-17 LAB — BASIC METABOLIC PANEL
Anion gap: 10 (ref 5–15)
BUN: 14 mg/dL (ref 8–23)
CO2: 30 mmol/L (ref 22–32)
Calcium: 8.6 mg/dL — ABNORMAL LOW (ref 8.9–10.3)
Chloride: 104 mmol/L (ref 98–111)
Creatinine, Ser: 0.87 mg/dL (ref 0.44–1.00)
GFR, Estimated: >60 mL/min (ref >60)
Glucose, Bld: 101 mg/dL — ABNORMAL HIGH (ref 70–99)
Potassium: 3.7 mmol/L (ref 3.5–5.1)
Sodium: 144 mmol/L (ref 135–145)

## 2020-12-17 LAB — POCT I-STAT 7, (LYTES, BLD GAS, ICA,H+H)
Acid-Base Excess: 8 mmol/L — ABNORMAL HIGH (ref 0.0–2.0)
Bicarbonate: 31.3 mmol/L — ABNORMAL HIGH (ref 20.0–28.0)
Calcium, Ion: 1.1 mmol/L — ABNORMAL LOW (ref 1.15–1.40)
HCT: 42 % (ref 36.0–46.0)
Hemoglobin: 14.3 g/dL (ref 12.0–15.0)
O2 Saturation: 98 %
Potassium: 4.1 mmol/L (ref 3.5–5.1)
Sodium: 145 mmol/L (ref 135–145)
TCO2: 32 mmol/L (ref 22–32)
pCO2 arterial: 39.6 mmHg (ref 32.0–48.0)
pH, Arterial: 7.505 — ABNORMAL HIGH (ref 7.350–7.450)
pO2, Arterial: 101 mmHg (ref 83.0–108.0)

## 2020-12-17 LAB — CBC
HCT: 43.9 % (ref 36.0–46.0)
Hemoglobin: 14.3 g/dL (ref 12.0–15.0)
MCH: 31.2 pg (ref 26.0–34.0)
MCHC: 32.6 g/dL (ref 30.0–36.0)
MCV: 95.6 fL (ref 80.0–100.0)
Platelets: 193 10*3/uL (ref 150–400)
RBC: 4.59 MIL/uL (ref 3.87–5.11)
RDW: 16 % — ABNORMAL HIGH (ref 11.5–15.5)
WBC: 10.1 10*3/uL (ref 4.0–10.5)
nRBC: 0 % (ref 0.0–0.2)

## 2020-12-17 LAB — CREATININE, SERUM
Creatinine, Ser: 0.84 mg/dL (ref 0.44–1.00)
GFR, Estimated: >60 mL/min (ref >60)

## 2020-12-17 SURGERY — RIGHT/LEFT HEART CATH AND CORONARY ANGIOGRAPHY
Anesthesia: LOCAL

## 2020-12-17 MED ORDER — HEPARIN SODIUM (PORCINE) 1000 UNIT/ML IJ SOLN
INTRAMUSCULAR | Status: DC | PRN
  Administered 2020-12-17: 3000 [IU] via INTRAVENOUS

## 2020-12-17 MED ORDER — LIDOCAINE HCL (PF) 1 % IJ SOLN
INTRAMUSCULAR | Status: AC
  Filled 2020-12-17: qty 30

## 2020-12-17 MED ORDER — VERAPAMIL HCL 2.5 MG/ML IV SOLN
INTRAVENOUS | Status: DC | PRN
  Administered 2020-12-17: 10 mL via INTRA_ARTERIAL

## 2020-12-17 MED ORDER — SODIUM CHLORIDE 0.9 % IV SOLN: 250.0000 mL | INTRAVENOUS | Status: DC | PRN

## 2020-12-17 MED ORDER — SODIUM CHLORIDE 0.9% FLUSH: 3.0000 mL | INTRAVENOUS | Status: DC | PRN

## 2020-12-17 MED ORDER — SODIUM CHLORIDE 0.9 % IV SOLN: INTRAVENOUS | Status: DC

## 2020-12-17 MED ORDER — IOHEXOL 350 MG/ML SOLN
INTRAVENOUS | Status: DC | PRN
  Administered 2020-12-17: 40 mL

## 2020-12-17 MED ORDER — HEPARIN SODIUM (PORCINE) 1000 UNIT/ML IJ SOLN
INTRAMUSCULAR | Status: AC
  Filled 2020-12-17: qty 1

## 2020-12-17 MED ORDER — VERAPAMIL HCL 2.5 MG/ML IV SOLN
INTRAVENOUS | Status: AC
  Filled 2020-12-17: qty 2

## 2020-12-17 MED ORDER — ENOXAPARIN SODIUM 40 MG/0.4ML ~~LOC~~ SOLN
40.0000 mg | SUBCUTANEOUS | Status: DC
  Administered 2020-12-18 – 2020-12-20 (×3): 40 mg via SUBCUTANEOUS
  Filled 2020-12-17 (×3): qty 0.4

## 2020-12-17 MED ORDER — LIDOCAINE HCL (PF) 1 % IJ SOLN
INTRAMUSCULAR | Status: DC | PRN
  Administered 2020-12-17 (×2): 2 mL

## 2020-12-17 MED ORDER — MIDAZOLAM HCL 2 MG/2ML IJ SOLN
INTRAMUSCULAR | Status: AC
  Filled 2020-12-17: qty 2

## 2020-12-17 MED ORDER — MIDAZOLAM HCL 2 MG/2ML IJ SOLN
INTRAMUSCULAR | Status: DC | PRN
  Administered 2020-12-17: 1 mg via INTRAVENOUS

## 2020-12-17 MED ORDER — HEPARIN (PORCINE) IN NACL 1000-0.9 UT/500ML-% IV SOLN
INTRAVENOUS | Status: AC
  Filled 2020-12-17: qty 1000

## 2020-12-17 MED ORDER — ASPIRIN 81 MG PO CHEW
81.0000 mg | CHEWABLE_TABLET | ORAL | Status: AC
  Administered 2020-12-17: 81 mg via ORAL
  Filled 2020-12-17: qty 1

## 2020-12-17 MED ORDER — SODIUM CHLORIDE 0.9% FLUSH
3.0000 mL | Freq: Two times a day (BID) | INTRAVENOUS | Status: DC
  Administered 2020-12-17 – 2020-12-19 (×5): 3 mL via INTRAVENOUS

## 2020-12-17 MED ORDER — HEPARIN (PORCINE) IN NACL 1000-0.9 UT/500ML-% IV SOLN
INTRAVENOUS | Status: DC | PRN
  Administered 2020-12-17 (×2): 500 mL

## 2020-12-17 SURGICAL SUPPLY — 15 items
BAG SNAP BAND KOVER 36X36 (MISCELLANEOUS) ×1 IMPLANT
CATH 5FR JL3.5 JR4 ANG PIG MP (CATHETERS) ×1 IMPLANT
CATH BALLN WEDGE 5F 110CM (CATHETERS) ×1 IMPLANT
COVER DOME SNAP 22 D (MISCELLANEOUS) ×1 IMPLANT
DEVICE RAD TR BAND REGULAR (VASCULAR PRODUCTS) ×1 IMPLANT
GLIDESHEATH SLEND SS 6F .021 (SHEATH) ×1 IMPLANT
GUIDEWIRE .025 260CM (WIRE) ×1 IMPLANT
GUIDEWIRE INQWIRE 1.5J.035X260 (WIRE) IMPLANT
INQWIRE 1.5J .035X260CM (WIRE) ×2
KIT HEART LEFT (KITS) ×2 IMPLANT
PACK CARDIAC CATHETERIZATION (CUSTOM PROCEDURE TRAY) ×2 IMPLANT
SHEATH GLIDE SLENDER 4/5FR (SHEATH) ×1 IMPLANT
SHEATH PROBE COVER 6X72 (BAG) ×1 IMPLANT
TRANSDUCER W/STOPCOCK (MISCELLANEOUS) ×2 IMPLANT
TUBING CIL FLEX 10 FLL-RA (TUBING) ×2 IMPLANT

## 2020-12-17 NOTE — Progress Notes (Signed)
Progress Note  Patient Name: [name] Date of Encounter: 12/17/2020  [facility] Cardiologist: No primary care provider on file.   Subjective   Reports dyspnea improved this morning.  Able to lie flat without shortness of breath or hypoxia  Inpatient Medications    Scheduled Meds: . aspirin EC  81 mg Oral QHS  . enoxaparin (LOVENOX) injection  40 mg Subcutaneous Q24H  . furosemide  40 mg Intravenous Q12H  . guaiFENesin  600 mg Oral BID  . hydrALAZINE  10 mg Oral Q8H  . ipratropium-albuterol  3 mL Nebulization Q6H  . isosorbide mononitrate  30 mg Oral Daily  . losartan  100 mg Oral Daily  . potassium chloride  40 mEq Oral BID  . pravastatin  40 mg Oral q1800  . sodium chloride flush  3 mL Intravenous Q12H  . spironolactone  25 mg Oral Daily  . venlafaxine XR  75 mg Oral QPC breakfast   Continuous Infusions: . sodium chloride     PRN Meds: sodium chloride, acetaminophen, ALPRAZolam, docusate sodium, hydrALAZINE, ondansetron (ZOFRAN) IV, sodium chloride flush   Vital Signs    Vitals:   12/17/20 0301 12/17/20 0407 12/17/20 0600 12/17/20 0743  BP: 117/78 103/74 136/86 126/74  Pulse: 62 65  68  Resp: 20 20 18 19   Temp:  97.7 F (36.5 C)  97.7 F (36.5 C)  TempSrc:  Oral  Axillary  SpO2: 97% 97% 96% 97%  Weight:  53.6 kg      Intake/Output Summary (Last 24 hours) at 12/17/2020 0821 Last data filed at 12/17/2020 0704 Gross per 24 hour  Intake -  Output 2200 ml  Net -2200 ml   Last 3 Weights 12/17/2020 04/16/2018 10/05/2017  Weight (lbs) 118 lb 2.7 oz 132 lb 3.2 oz 133 lb  Weight (kg) 53.6 kg 59.966 kg 60.328 kg      Telemetry    Normal sinus rhythm with PVCs- Personally Reviewed  ECG    No new ECG- Personally Reviewed  Physical Exam   GEN: No acute distress.   Neck: + JVD Cardiac: RRR, no murmurs Respiratory: Bibasilar crackles GI: Soft, nontender, non-distended  MS: No edema Neuro:  Nonfocal  Psych: Normal affect   Labs    High  Sensitivity Troponin:   Recent Labs  Lab 12/15/20 1514  TROPONINIHS 58*      Chemistry Recent Labs  Lab 12/15/20 1514 12/16/20 0423 12/17/20 0009  NA 146* 144 144  K 2.7* 3.5 3.7  CL 104 104 104  CO2 30 28 30   GLUCOSE 147* 122* 101*  BUN 10 12 14   CREATININE 0.91 0.88 0.87  CALCIUM 9.2 9.0 8.6*  GFRNONAA >60 >60 >60  ANIONGAP 12 12 10      Hematology Recent Labs  Lab 12/15/20 1514  WBC 9.7  RBC 4.88  HGB 15.0  HCT 48.0*  MCV 98.4  MCH 30.7  MCHC 31.3  RDW 15.8*  PLT 171    BNP Recent Labs  Lab 12/15/20 1514  BNP 3,235.0*     DDimer No results for input(s): DDIMER in the last 168 hours.   Radiology    DG Chest 2 View  Result Date: 12/15/2020 CLINICAL DATA:  87 year old female with shortness of breath. EXAM: CHEST - 2 VIEW COMPARISON:  Chest radiograph dated 03/05/2013 FINDINGS: Small bilateral pleural effusions, left greater right with associated bibasilar compressive atelectasis. Pneumonia is not excluded clinical correlation is recommended. There is diffuse interstitial and vascular prominence consistent with edema. No  pneumothorax. There is cardiomegaly. Atherosclerotic calcification of the aorta. Osteopenia with scoliosis and degenerative changes of the spine. A 16 mm ovoid radiopaque focus over the spine on the lateral view is not identified on the frontal projection. No acute osseous pathology. IMPRESSION: CHF with small bilateral pleural effusions and bibasilar atelectasis. Pneumonia is not excluded. Electronically Signed   By: Anner Crete M.D.   On: 12/15/2020 15:56   DG Chest Port 1 View  Result Date: 12/16/2020 CLINICAL DATA:  CHF EXAM: PORTABLE CHEST 1 VIEW COMPARISON:  Radiograph 12/15/2020, CT 04/30/2018 FINDINGS: Some increasing hazy interstitial opacities throughout the lungs on a background of more chronic interstitial and bronchitic change with basilar scarring. Some more dense opacity is noted in gradient fashion towards the lung bases  could reflect developing alveolar edema or layering pleural effusion. No pneumothorax. Cardiomegaly is similar to priors with a calcified aorta. No acute osseous or soft tissue abnormality. Degenerative changes are present in the imaged spine and shoulders. Dextrocurvature of the thoracic levels is similar to prior studies. Telemetry leads overlie the chest. IMPRESSION: 1. Increasing hazy interstitial opacities throughout the lungs likely reflecting edema on a background of more chronic interstitial and bronchitic change with basilar scarring. 2. More dense opacity in gradient towards the lung bases could reflect developing alveolar edema or layering pleural effusion. Infection less favored in the absence of clinical symptoms. Electronically Signed   By: Lovena Le M.D.   On: 12/16/2020 05:06   ECHOCARDIOGRAM COMPLETE  Result Date: 12/16/2020    ECHOCARDIOGRAM REPORT   Patient Name:   [name] Date of Exam: 12/16/2020 Medical Rec #:  [id number]      Height:       59.0 in Accession #:    [id number]     Weight:       132.2 lb Date of Birth:  1934-08-30      BSA:          1.547 m Patient Age:    87 years       BP:           153/102 mmHg Patient Gender: F              HR:           79 bpm. Exam Location:  Inpatient Procedure: 2D Echo, Color Doppler and Cardiac Doppler Indications:    CHF-Acute Diastolic  History:        Patient has prior history of Echocardiogram examinations. COPD,                 Signs/Symptoms:Shortness of Breath; Risk Factors:Hypertension.  Sonographer:    Clayton Lefort RDCS (AE) Referring Phys: [id number] [place]  1. Left ventricular ejection fraction, by estimation, is 25 to 30%. Left ventricular ejection fraction by 3D volume is 31 %. The left ventricle has severely decreased function. The left ventricle demonstrates global hypokinesis. There is mild left ventricular hypertrophy. Left ventricular diastolic parameters are consistent with Grade I diastolic dysfunction (impaired  relaxation).  2. Right ventricular systolic function is normal. The right ventricular size is normal. There is mildly elevated pulmonary artery systolic pressure.  3. Left atrial size was severely dilated.  4. The mitral valve is degenerative. Moderate mitral valve regurgitation. No evidence of mitral stenosis. Moderate mitral annular calcification.  5. The aortic valve is calcified. Aortic valve regurgitation is trivial. Mild aortic valve sclerosis is present, with no evidence of aortic valve stenosis.  6. Aortic dilatation noted. There  is mild dilatation of the ascending aorta, measuring 39 mm.  7. The inferior vena cava is normal in size with greater than 50% respiratory variability, suggesting right atrial pressure of 3 mmHg. FINDINGS  Left Ventricle: Left ventricular ejection fraction, by estimation, is 25 to 30%. Left ventricular ejection fraction by 3D volume is 31 %. The left ventricle has severely decreased function. The left ventricle demonstrates global hypokinesis. The left ventricular internal cavity size was normal in size. There is mild left ventricular hypertrophy. Left ventricular diastolic parameters are consistent with Grade I diastolic dysfunction (impaired relaxation). Right Ventricle: The right ventricular size is normal. No increase in right ventricular wall thickness. Right ventricular systolic function is normal. There is mildly elevated pulmonary artery systolic pressure. The tricuspid regurgitant velocity is 2.66  m/s, and with an assumed right atrial pressure of 8 mmHg, the estimated right ventricular systolic pressure is 38.2 mmHg. Left Atrium: Left atrial size was severely dilated. Right Atrium: Right atrial size was normal in size. Pericardium: There is no evidence of pericardial effusion. Mitral Valve: The mitral valve is degenerative in appearance. There is moderate thickening of the mitral valve leaflet(s). There is moderate calcification of the mitral valve leaflet(s). Moderate  mitral annular calcification. Moderate mitral valve regurgitation. No evidence of mitral valve stenosis. MV peak gradient, 4.2 mmHg. The mean mitral valve gradient is 1.0 mmHg. Tricuspid Valve: The tricuspid valve is normal in structure. Tricuspid valve regurgitation is trivial. No evidence of tricuspid stenosis. Aortic Valve: The aortic valve is calcified. Aortic valve regurgitation is trivial. Aortic regurgitation PHT measures 465 msec. Mild aortic valve sclerosis is present, with no evidence of aortic valve stenosis. Aortic valve mean gradient measures 4.0 mmHg. Aortic valve peak gradient measures 7.7 mmHg. Aortic valve area, by VTI measures 1.93 cm. Pulmonic Valve: The pulmonic valve was normal in structure. Pulmonic valve regurgitation is not visualized. No evidence of pulmonic stenosis. Aorta: The aortic root is normal in size and structure and aortic dilatation noted. There is mild dilatation of the ascending aorta, measuring 39 mm. Venous: The inferior vena cava is normal in size with greater than 50% respiratory variability, suggesting right atrial pressure of 3 mmHg. IAS/Shunts: No atrial level shunt detected by color flow Doppler.  LEFT VENTRICLE PLAX 2D LVIDd:         5.20 cm         Diastology LVIDs:         4.80 cm         LV e' medial:    4.24 cm/s LV PW:         1.60 cm         LV E/e' medial:  17.7 LV IVS:        1.30 cm         LV e' lateral:   4.79 cm/s LVOT diam:     2.20 cm         LV E/e' lateral: 15.7 LV SV:         44 LV SV Index:   28 LVOT Area:     3.80 cm        3D Volume EF                                LV 3D EF:    Left  ventricular                                             ejection                                             fraction by                                             3D volume                                             is 31 %.                                 3D Volume EF:                                3D EF:        31 %                                 LV EDV:       183 ml                                LV ESV:       126 ml                                LV SV:        57 ml RIGHT VENTRICLE            IVC RV Basal diam:  3.30 cm    IVC diam: 1.30 cm RV S prime:     9.57 cm/s TAPSE (M-mode): 2.3 cm LEFT ATRIUM             Index       RIGHT ATRIUM           Index LA diam:        3.10 cm 2.00 cm/m  RA Area:     14.30 cm LA Vol (A2C):   90.0 ml 58.18 ml/m RA Volume:   30.70 ml  19.85 ml/m LA Vol (A4C):   55.4 ml 35.82 ml/m LA Biplane Vol: 71.1 ml 45.97 ml/m  AORTIC VALVE AV Area (Vmax):    1.94 cm AV Area (Vmean):   2.03 cm AV Area (VTI):     1.93 cm AV Vmax:           139.00 cm/s AV Vmean:          93.200 cm/s AV VTI:            0.226 m AV Peak Grad:      7.7 mmHg AV Mean Grad:  4.0 mmHg LVOT Vmax:         71.00 cm/s LVOT Vmean:        49.800 cm/s LVOT VTI:          0.115 m LVOT/AV VTI ratio: 0.51 AI PHT:            465 msec  AORTA Ao Root diam: 3.10 cm Ao Asc diam:  3.90 cm MITRAL VALVE                 TRICUSPID VALVE MV Area (PHT): 3.91 cm      TR Peak grad:   28.3 mmHg MV Area VTI:   1.62 cm      TR Vmax:        266.00 cm/s MV Peak grad:  4.2 mmHg MV Mean grad:  1.0 mmHg      SHUNTS MV Vmax:       1.02 m/s      Systemic VTI:  0.12 m MV Vmean:      49.3 cm/s     Systemic Diam: 2.20 cm MV Decel Time: 194 msec MR Peak grad:    119.7 mmHg MR Mean grad:    65.0 mmHg MR Vmax:         547.00 cm/s MR Vmean:        369.0 cm/s MR PISA:         3.08 cm MR PISA Eff ROA: 22 mm MR PISA Radius:  0.70 cm MV E velocity: 75.00 cm/s MV A velocity: 81.80 cm/s MV E/A ratio:  0.92 Candee Furbish MD Electronically signed by Candee Furbish MD Signature Date/Time: 12/16/2020/2:19:56 PM    Final    VAS [place] LOWER EXTREMITY VENOUS (DVT) (ONLY [facility] & [facility])  Result Date: 12/16/2020  Lower Venous DVT Study Indications: Swelling.  Risk Factors: None identified. Comparison Study: No prior studies. Performing Technologist: Oliver Hum RVT  Examination  Guidelines: A complete evaluation includes B-mode imaging, spectral Doppler, color Doppler, and power Doppler as needed of all accessible portions of each vessel. Bilateral testing is considered an integral part of a complete examination. Limited examinations for reoccurring indications may be performed as noted. The reflux portion of the exam is performed with the patient in reverse Trendelenburg.  +-----+---------------+---------+-----------+----------+--------------+ RIGHTCompressibilityPhasicitySpontaneityPropertiesThrombus Aging +-----+---------------+---------+-----------+----------+--------------+ CFV  Full           Yes      Yes                                 +-----+---------------+---------+-----------+----------+--------------+   +---------+---------------+---------+-----------+----------+--------------+ LEFT     CompressibilityPhasicitySpontaneityPropertiesThrombus Aging +---------+---------------+---------+-----------+----------+--------------+ CFV      Full           Yes      Yes                                 +---------+---------------+---------+-----------+----------+--------------+ SFJ      Full                                                        +---------+---------------+---------+-----------+----------+--------------+ FV Prox  Full                                                        +---------+---------------+---------+-----------+----------+--------------+  FV Mid   Full                                                        +---------+---------------+---------+-----------+----------+--------------+ FV DistalFull                                                        +---------+---------------+---------+-----------+----------+--------------+ PFV      Full                                                        +---------+---------------+---------+-----------+----------+--------------+ POP      Full           Yes      Yes                                  +---------+---------------+---------+-----------+----------+--------------+ PTV      Full                                                        +---------+---------------+---------+-----------+----------+--------------+ PERO     Full                                                        +---------+---------------+---------+-----------+----------+--------------+     Summary: RIGHT: - No evidence of common femoral vein obstruction.  LEFT: - There is no evidence of deep vein thrombosis in the lower extremity.  - No cystic structure found in the popliteal fossa.  *See table(s) above for measurements and observations. Electronically signed by Jamelle Haring on 12/16/2020 at 8:10:31 PM.    Final     Cardiac Studies   ECHO: 12/16/2020  1. Left ventricular ejection fraction, by estimation, is 25 to 30%. Left  ventricular ejection fraction by 3D volume is 31 %. The left ventricle has  severely decreased function. The left ventricle demonstrates global  hypokinesis. There is mild left ventricular hypertrophy. Left ventricular diastolic parameters are consistent with Grade I diastolic dysfunction (impaired relaxation).  2. Right ventricular systolic function is normal. The right ventricular  size is normal. There is mildly elevated pulmonary artery systolic  pressure.  3. Left atrial size was severely dilated.  4. The mitral valve is degenerative. Moderate mitral valve regurgitation.  No evidence of mitral stenosis. Moderate mitral annular calcification.  5. The aortic valve is calcified. Aortic valve regurgitation is trivial.  Mild aortic valve sclerosis is present, with no evidence of aortic valve  stenosis.  6. Aortic dilatation noted. There is mild dilatation of the ascending  aorta, measuring 39 mm.  7. The inferior vena cava is normal in size  with greater than 50%  respiratory variability, suggesting right atrial pressure of 3 mmHg.   Patient  Profile     87 y.o. female with a hx of HTN, HLD, COPD, squamous cell skin CA (perianal), anxiety/depression, hypokalemia, who is being seen today for the evaluation of CHF   Assessment & Plan    Acute combined systolic and diastolic heart failure: Presented with shortness of breath and volume overload.  Echocardiogram showed LVEF 25 to 30%, new diagnosis.  Unclear cause.  Will need to rule out ischemia.  Has been significantly hypertensive, could represent hypertensive cardiomyopathy -Respiratory status appears improved today, she is able to lie flat without dyspnea or hypoxia.  Recommend RHC/LHC to evaluate for obstructive CAD as the cause of her cardiomyopathy.  Risks and benefits of cardiac catheterization have been discussed with the patient.  These include bleeding, infection, kidney damage, stroke, heart attack, death.  The patient understands these risks and is willing to proceed.  She did request that I speak to her daughter as well.  I called her daughter [name], and she was in agreement with proceeding with cath. -Continue IV Lasix 40 mg twice daily, will follow up results of RHC -Continue losartan 100 mg daily, transition to Entresto if stable renal function following cath -Continue spironolactone 25 mg daily -Continue Imdur/hydralazine  Hypertension: BP has been elevated to 160s over 110s.  Improved to 126/74 this morning -Continue losartan, spironolactone, Imdur/hydralazine as above -Given her issues with hypokalemia that preceded diuretic use, ordered renin/aldosterone levels to evaluate for hyperaldosteronism  Hypokalemia: Potassium 2.7 on admission.  Continue aggressive potassium repletion while diuresing.  Evaluate for hyperaldosteronism as above  For questions or updates, please contact [place] Please consult [email or web address] for contact info under        Signed, Donato Heinz, MD  12/17/2020, 8:21 AM

## 2020-12-17 NOTE — Progress Notes (Signed)
Right radial and groins clipped, consent for cardiac cath. signed by pt. Kept NPO.

## 2020-12-17 NOTE — Progress Notes (Signed)
Transported to the cath. Lab by bed awake and alert. 

## 2020-12-17 NOTE — Progress Notes (Signed)
Heart Failure Stewardship Pharmacist Progress Note   PCP: Hulan Fess, MD PCP-Cardiologist: No primary care provider on file.    HPI:  87 yo F with PMH of HTN, HLD, COPD, squamous cell skin cancer, anxiety/depression, and hypokalemia. She presented to the ED on 12/15/20 with shortness of breath and fluid overload. Admitted for acute combine systolic and diastolic HF. An ECHO was done on 12/16/20 and LVEF is 25-30%. Pending R/LHC today.   Current HF Medications: Furosemide 40 mg IV BID Losartan 100 mg daily Spironolactone 25 mg daily Hydralazine 10 mg q8h Imdur 30 mg daily  Prior to admission HF Medications: Losartan 50 mg daily  Pertinent Lab Values: . Serum creatinine 0.87, BUN 14, Potassium 3.7, Sodium 144, BNP 3235, Magnesium 2.0   Vital Signs: . Weight: 118 lbs (admission weight: not taken lbs) . Blood pressure: 120/70s  . Heart rate: 60s   Medication Assistance / Insurance Benefits Check: Does the patient have prescription insurance?  Yes Type of insurance plan: UHC Medicare  Does the patient qualify for medication assistance through manufacturers or grants?   Pending . Eligible grants and/or patient assistance programs: pending . Medication assistance applications in progress: none  . Medication assistance applications approved: none  Approved medication assistance renewals will be completed by: pending  Outpatient Pharmacy:  Prior to admission outpatient pharmacy: [facility] Is the patient willing to use [place] at discharge? Yes  Assessment: 1. Acute on chronic systolic CHF (EF 54-62%), pending R/LHC today. NYHA class II symptoms. - Continue furosemide 40 mg IV BID - Consider starting beta blocker following IV diuresis - Continue losartan 100 mg daily for now. Can optimize to [facility] following cath. - Continue spironolactone 25 mg daily - Consider starting Farxiga 10 mg daily prior to discharge - Continue hydralazine 10 mg q8h - Continue Imdur  30 mg daily   Plan: 1) Medication changes recommended at this time: - Continue current regimen pending results of cath today  2) Patient assistance application(s): - Entresto copay $47 per month - Farxiga copay $47 per month - Can help complete patient assistance applications to lower copay to $0 per month for both prescriptions  3)  Education  - To be completed prior to discharge  Kerby Nora, PharmD, BCPS Heart Failure Stewardship Pharmacist Phone [phone]

## 2020-12-17 NOTE — Progress Notes (Addendum)
Back from the cath; lab by bed awake and alert. TR Band to right wrist intact. Instructed to avoid movement the affected arm. Elevated with pillow, pulse ox to right thumb.

## 2020-12-17 NOTE — Progress Notes (Signed)
Non compliant in keeping  right arm elevated with pillow and  avoiding  movement  of the affected arm.

## 2020-12-17 NOTE — Evaluation (Signed)
Occupational Therapy Evaluation Patient Details Name: [name] MRN: [id number] DOB: [birth year] Today's Date: 12/17/2020    History of Present Illness Pt is an 87 y/o female admitted secondary to SOB and BLE swelling. Thought to be secondary to acute CHF. PMH includes HTN and COPD.   Clinical Impression   Pt typically mod I with RW in home environment. 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 Today Pt is min A for boost into standing with RW and vc for safe hand placement with transfers, able to ambulate into bathroom, and perform one round of peri care but ultimately required max A in standing for rear peri care. Pt able to reach to feet to don socks but presents with decreased balance, activity tolerance. Pt pending cath procedure  Currently. OT will follow acutely.  Of note: Pt on 2L O2 when OT entered, on RA in seated position Pt maintained saturations at 96%, with ambulation on RA Pt SpO2 dropped to 77% and required 4L O2 to maintain saturations above 90% with activity.     Follow Up Recommendations  Home health OT;Supervision/Assistance - 24 hour (initially; watch for need for SNF)    Equipment Recommendations  3 in 1 bedside commode    Recommendations for Other Services       Precautions / Restrictions Precautions Precautions: Fall Precaution Comments: watch SpO2 Restrictions Weight Bearing Restrictions: No      Mobility Bed Mobility Overal bed mobility: Needs Assistance Bed Mobility: Supine to Sit     Supine to sit: Supervision     General bed mobility comments: Supervision for safety.    Transfers Overall transfer level: Needs assistance Equipment used: Rolling walker (2 wheeled) Transfers: Sit to/from Stand Sit to Stand: Min assist         General transfer comment: min A to boost into standing and then Min guard for safety, vc for safe hand placement    Balance Overall balance assessment: Needs assistance Sitting-balance support: Feet  supported;No upper extremity supported Sitting balance-Leahy Scale: Good     Standing balance support: Bilateral upper extremity supported;During functional activity Standing balance-Leahy Scale: Poor Standing balance comment: Reliant on BUE support                           ADL either performed or assessed with clinical judgement   ADL Overall ADL's : Needs assistance/impaired Eating/Feeding: NPO   Grooming: Wash/dry hands;Min guard;Standing Grooming Details (indicate cue type and reason): sink level Upper Body Bathing: Min guard;Sitting   Lower Body Bathing: Min guard;Sitting/lateral leans   Upper Body Dressing : Set up;Sitting   Lower Body Dressing: Moderate assistance;Sit to/from stand Lower Body Dressing Details (indicate cue type and reason): able to bend over and don socks Toilet Transfer: Min guard;Ambulation;RW Toilet Transfer Details (indicate cue type and reason): into bathroom, vc for safe hand placement with transfers Toileting- Clothing Manipulation and Hygiene: Maximal assistance;Sit to/from stand Toileting - Clothing Manipulation Details (indicate cue type and reason): Pt able to perform peri care, but requested assist for rear peri care     Functional mobility during ADLs: Min guard;Minimal assistance;Rolling walker;Cueing for safety (vc for safe hand placement) General ADL Comments: decreased activity tolerance, desaturation with activity     Vision Baseline Vision/History: Wears glasses;Legally blind (in R eye) Wears Glasses: At all times Patient Visual Report: No change from baseline Additional Comments: has a very large magnifying glass in room as well     Perception  Praxis      Pertinent Vitals/Pain Pain Assessment: No/denies pain     Hand Dominance Right   Extremity/Trunk Assessment Upper Extremity Assessment Upper Extremity Assessment: Overall WFL for tasks assessed   Lower Extremity Assessment Lower Extremity Assessment:  Generalized weakness   Cervical / Trunk Assessment Cervical / Trunk Assessment: Normal   Communication Communication Communication: No difficulties   Cognition Arousal/Alertness: Awake/alert Behavior During Therapy: WFL for tasks assessed/performed Overall Cognitive Status: Within Functional Limits for tasks assessed                                     General Comments  Pt sitting on room air at 96%, with activity on RA she desaturated to 77%, for activity required 4L SpO2 to maintain saturations >90%    Exercises     Shoulder Instructions      Home Living Family/patient expects to be discharged to:: Private residence Living Arrangements: Alone Available Help at Discharge: Family;Available PRN/intermittently (daughters live next door but work) Type of Home: House Home Access: Stairs to enter Technical brewer of Steps: 3 Entrance Stairs-Rails: None Home Layout: One level     Bathroom Shower/Tub: Occupational psychologist: Standard Bathroom Accessibility: Yes How Accessible: Accessible via walker Home Equipment: [place] - 2 wheels;Shower seat          Prior Functioning/Environment Level of Independence: Independent with assistive device(s)        Comments: Uses RW for ambulation        OT Problem List: Decreased activity tolerance;Decreased safety awareness;Cardiopulmonary status limiting activity      OT Treatment/Interventions: Self-care/ADL training;Therapeutic exercise;DME and/or AE instruction;Therapeutic activities;Patient/family education;Balance training    OT Goals(Current goals can be found in the care plan section) Acute Rehab OT Goals Patient Stated Goal: to feel better OT Goal Formulation: With patient Time For Goal Achievement: 12/31/20 Potential to Achieve Goals: Good  OT Frequency: Min 2X/week   Barriers to D/C:    Daughter lives next door       Co-evaluation              AM-PAC OT "6 Clicks" Daily  Activity     Outcome Measure Help from another person eating meals?: Total (NPO) Help from another person taking care of personal grooming?: A Little Help from another person toileting, which includes using toliet, bedpan, or urinal?: A Lot Help from another person bathing (including washing, rinsing, drying)?: A Little Help from another person to put on and taking off regular upper body clothing?: A Little Help from another person to put on and taking off regular lower body clothing?: A Lot 6 Click Score: 14   End of Session Equipment Utilized During Treatment: Gait belt;Rolling walker;Oxygen (4L) Nurse Communication: Mobility status;Precautions  Activity Tolerance: Patient tolerated treatment well Patient left: in chair;with call bell/phone within reach  OT Visit Diagnosis: Other abnormalities of gait and mobility (R26.89);Muscle weakness (generalized) (M62.81)                Time: 8144-8185 OT Time Calculation (min): 40 min Charges:  OT General Charges $OT Visit: 1 Visit OT Evaluation $OT Eval Moderate Complexity: 1 Mod OT Treatments $Self Care/Home Management : 8-22 mins $Therapeutic Activity: 8-22 mins  Jesse Sans OTR/L Acute Rehabilitation Services Pager: [phone] Office: [place] 12/17/2020, 10:37 AM

## 2020-12-17 NOTE — Interval H&P Note (Signed)
History and Physical Interval Note:  12/17/2020 2:16 PM  [name]  has presented today for surgery, with the diagnosis of CHF.  The various methods of treatment have been discussed with the patient and family. After consideration of risks, benefits and other options for treatment, the patient has consented to  Procedure(s): RIGHT/LEFT HEART CATH AND CORONARY ANGIOGRAPHY (N/A) as a surgical intervention.  The patient's history has been reviewed, patient examined, no change in status, stable for surgery.  I have reviewed the patient's chart and labs.  Questions were answered to the patient's satisfaction.    Cath Lab Visit (complete for each Cath Lab visit)  Clinical Evaluation Leading to the Procedure:   ACS: No.  Non-ACS:    Anginal Classification: CCS I  Anti-ischemic medical therapy: Minimal Therapy (1 class of medications)  Non-Invasive Test Results: No non-invasive testing performed  Prior CABG: No previous CABG       [name] [facility] 12/17/2020 2:16 PM

## 2020-12-17 NOTE — Progress Notes (Addendum)
PROGRESS NOTE    [name]  SHF:[id number] DOB: [birth year] DOA: 12/15/2020 PCP: Hulan Fess, MD   Brief Narrative: 87 year old with past medical history significant for hypertension, hyponatremia secondary to hydrochlorothiazide, COPD, anxiety depression who presented with new onset of shortness of breath.  Patient reports her symptoms started 4 to 5 days prior to admission, she initially started with exertional dyspnea, since the day of admission her shortness of breath is constant.  Presents with significantly elevated blood pressure, fluid overload, chest x-ray with bilateral pleural effusion, potassium 2.7. Patient was found to have new reduce Ef 25 %  Assessment & Plan:   Principal Problem:   Acute combined systolic and diastolic CHF, NYHA class 4 (HCC) Active Problems:   Hypertension   Cardiomyopathy ([place]), type unknown   COPD suggested by initial evaluation ([place])  1-Acute systolic and Diastolic Heart Failure Exacerbation: -Patient presents with Dyspnea. Chest x-ray: With increase opacity could reflect pulmonary edema. BNP; 3235. -Continue with IV lasix 40 mg IV BID.  -Weight: 118--- -Negative 3.5  L.  -ECHO: Ef 25 %, diastolic dysfunction grade one, Mitral Valve regurgitation.  -Cardiology consulted, recommend ischemic evaluation during this admission. Plan for cath today.   Hypokalemia: She will received 40 meq times one today.  Started on spironolactone.   Hyperetension: Continue with Cozaar and lasix.  On low dose hydralazine, Imdur.   COPD: Schedule nebulizer.   HLD: Continue with Pravachol.   Elevated Blood sugar: pre diabetes range. Diet modification.  HbA1c; 5.8  Memory problems; worse over last 6 months.  -Plan to check B-12, vitamin D.  -needs out patient referral to neurology for dementia evaluation.   Estimated body mass index is 23.87 kg/m as calculated from the following:   Height as of 04/16/18: 4\' 11"  (1.499 m).   Weight as of this  encounter: 53.6 kg.   DVT prophylaxis: Lovenox Code Status: Full code Family Communication: care discussed with patient. Daughter updated 2/24 Disposition Plan:  Status is: Inpatient  Remains inpatient appropriate because:IV treatments appropriate due to intensity of illness or inability to take PO   Dispo: The patient is from: Home              Anticipated d/c is to: to be determine              Anticipated d/c date is: 2 days              Patient currently is not medically stable to d/c.   Difficult to place patient No        Consultants:   None  Procedures:   ECHO: Left Ventricle: Left ventricular ejection fraction, by estimation, is 25  to 30%. Left ventricular ejection fraction by 3D volume is 31 %. The left  ventricle has severely decreased function. The left ventricle demonstrates  global hypokinesis. The left  ventricular internal cavity size was normal in size. There is mild left  ventricular hypertrophy. Left ventricular diastolic parameters are  consistent with Grade I diastolic dysfunction (impaired relaxation).   Antimicrobials:    Subjective: Patient report SOB improved. Not breathing at baseline.  Denies chest pain.   Objective: Vitals:   12/17/20 0000 12/17/20 0301 12/17/20 0407 12/17/20 0600  BP: 113/68 117/78 103/74 136/86  Pulse: 62 62 65   Resp: 20 20 20 18   Temp: 97.6 F (36.4 C)  97.7 F (36.5 C)   TempSrc: Axillary  Oral   SpO2: 95% 97% 97% 96%  Weight:   53.6 kg  Intake/Output Summary (Last 24 hours) at 12/17/2020 0744 Last data filed at 12/17/2020 0704 Gross per 24 hour  Intake --  Output 2200 ml  Net -2200 ml   Filed Weights   12/17/20 0407  Weight: 53.6 kg    Examination:  General exam: NAD Respiratory system: B/L crackles.  Cardiovascular system: S 1, S 2 RRR Gastrointestinal system: BS present, soft,  nt Central nervous system: Alert Extremities: plus 1 edema   Data Reviewed: I have personally reviewed  following labs and imaging studies  CBC: Recent Labs  Lab 12/15/20 1514  WBC 9.7  HGB 15.0  HCT 48.0*  MCV 98.4  PLT 401   Basic Metabolic Panel: Recent Labs  Lab 12/15/20 1514 12/16/20 0423 12/17/20 0009  NA 146* 144 144  K 2.7* 3.5 3.7  CL 104 104 104  CO2 30 28 30   GLUCOSE 147* 122* 101*  BUN 10 12 14   CREATININE 0.91 0.88 0.87  CALCIUM 9.2 9.0 8.6*  MG 2.0  --   --    GFR: CrCl cannot be calculated (Unknown ideal weight.). Liver Function Tests: No results for input(s): AST, ALT, ALKPHOS, BILITOT, PROT, ALBUMIN in the last 168 hours. No results for input(s): LIPASE, AMYLASE in the last 168 hours. No results for input(s): AMMONIA in the last 168 hours. Coagulation Profile: No results for input(s): INR, PROTIME in the last 168 hours. Cardiac Enzymes: No results for input(s): CKTOTAL, CKMB, CKMBINDEX, TROPONINI in the last 168 hours. BNP (last 3 results) No results for input(s): PROBNP in the last 8760 hours. HbA1C: Recent Labs    12/16/20 0423  HGBA1C 5.8*   CBG: No results for input(s): GLUCAP in the last 168 hours. Lipid Profile: No results for input(s): CHOL, HDL, LDLCALC, TRIG, CHOLHDL, LDLDIRECT in the last 72 hours. Thyroid Function Tests: Recent Labs    12/15/20 2010  TSH 3.422   Anemia Panel: No results for input(s): VITAMINB12, FOLATE, FERRITIN, TIBC, IRON, RETICCTPCT in the last 72 hours. Sepsis Labs: No results for input(s): PROCALCITON, LATICACIDVEN in the last 168 hours.  Recent Results (from the past 240 hour(s))  Resp Panel by RT-PCR (Flu A&B, Covid) Nasopharyngeal Swab     Status: None   Collection Time: 12/15/20  6:13 PM   Specimen: Nasopharyngeal Swab; Nasopharyngeal(NP) swabs in vial transport medium  Result Value Ref Range Status   SARS Coronavirus 2 by RT PCR NEGATIVE NEGATIVE Final    Comment: (NOTE) SARS-CoV-2 target nucleic acids are NOT DETECTED.  The SARS-CoV-2 RNA is generally detectable in upper respiratory specimens  during the acute phase of infection. The lowest concentration of SARS-CoV-2 viral copies this assay can detect is 138 copies/mL. A negative result does not preclude SARS-Cov-2 infection and should not be used as the sole basis for treatment or other patient management decisions. A negative result may occur with  improper specimen collection/handling, submission of specimen other than nasopharyngeal swab, presence of viral mutation(s) within the areas targeted by this assay, and inadequate number of viral copies(<138 copies/mL). A negative result must be combined with clinical observations, patient history, and epidemiological information. The expected result is Negative.  Fact Sheet for Patients:  EntrepreneurPulse.com.au  Fact Sheet for Healthcare Providers:  IncredibleEmployment.be  This test is no t yet approved or cleared by the [place] FDA and  has been authorized for detection and/or diagnosis of SARS-CoV-2 by FDA under an Emergency Use Authorization (EUA). This EUA will remain  in effect (meaning this test can be used) for the  duration of the COVID-19 declaration under Section 564(b)(1) of the Act, 21 U.S.C.section 360bbb-3(b)(1), unless the authorization is terminated  or revoked sooner.       Influenza A by PCR NEGATIVE NEGATIVE Final   Influenza B by PCR NEGATIVE NEGATIVE Final    Comment: (NOTE) The Xpert Xpress SARS-CoV-2/FLU/RSV plus assay is intended as an aid in the diagnosis of influenza from Nasopharyngeal swab specimens and should not be used as a sole basis for treatment. Nasal washings and aspirates are unacceptable for Xpert Xpress SARS-CoV-2/FLU/RSV testing.  Fact Sheet for Patients: EntrepreneurPulse.com.au  Fact Sheet for Healthcare Providers: IncredibleEmployment.be  This test is not yet approved or cleared by the [place] FDA and has been authorized for detection  and/or diagnosis of SARS-CoV-2 by FDA under an Emergency Use Authorization (EUA). This EUA will remain in effect (meaning this test can be used) for the duration of the COVID-19 declaration under Section 564(b)(1) of the Act, 21 U.S.C. section 360bbb-3(b)(1), unless the authorization is terminated or revoked.  Performed at [facility], [place] [address] [place], [place] [address]   MRSA PCR Screening     Status: None   Collection Time: 12/16/20  3:20 PM   Specimen: Nasal Mucosa; Nasopharyngeal  Result Value Ref Range Status   MRSA by PCR NEGATIVE NEGATIVE Final    Comment:        The GeneXpert MRSA Assay (FDA approved for NASAL specimens only), is one component of a comprehensive MRSA colonization surveillance program. It is not intended to diagnose MRSA infection nor to guide or monitor treatment for MRSA infections. Performed at [facility], [place] [address] [place], [place] [address]          Radiology Studies: DG Chest 2 View  Result Date: 12/15/2020 CLINICAL DATA:  87 year old female with shortness of breath. EXAM: CHEST - 2 VIEW COMPARISON:  Chest radiograph dated 03/05/2013 FINDINGS: Small bilateral pleural effusions, left greater right with associated bibasilar compressive atelectasis. Pneumonia is not excluded clinical correlation is recommended. There is diffuse interstitial and vascular prominence consistent with edema. No pneumothorax. There is cardiomegaly. Atherosclerotic calcification of the aorta. Osteopenia with scoliosis and degenerative changes of the spine. A 16 mm ovoid radiopaque focus over the spine on the lateral view is not identified on the frontal projection. No acute osseous pathology. IMPRESSION: CHF with small bilateral pleural effusions and bibasilar atelectasis. Pneumonia is not excluded. Electronically Signed   By: Anner Crete M.D.   On: 12/15/2020 15:56   DG Chest Port 1 View  Result Date: 12/16/2020 CLINICAL DATA:  CHF EXAM:  PORTABLE CHEST 1 VIEW COMPARISON:  Radiograph 12/15/2020, CT 04/30/2018 FINDINGS: Some increasing hazy interstitial opacities throughout the lungs on a background of more chronic interstitial and bronchitic change with basilar scarring. Some more dense opacity is noted in gradient fashion towards the lung bases could reflect developing alveolar edema or layering pleural effusion. No pneumothorax. Cardiomegaly is similar to priors with a calcified aorta. No acute osseous or soft tissue abnormality. Degenerative changes are present in the imaged spine and shoulders. Dextrocurvature of the thoracic levels is similar to prior studies. Telemetry leads overlie the chest. IMPRESSION: 1. Increasing hazy interstitial opacities throughout the lungs likely reflecting edema on a background of more chronic interstitial and bronchitic change with basilar scarring. 2. More dense opacity in gradient towards the lung bases could reflect developing alveolar edema or layering pleural effusion. Infection less favored in the absence of clinical symptoms. Electronically Signed  By: Lovena Le M.D.   On: 12/16/2020 05:06   ECHOCARDIOGRAM COMPLETE  Result Date: 12/16/2020    ECHOCARDIOGRAM REPORT   Patient Name:   [name] Date of Exam: 12/16/2020 Medical Rec #:  [id number]      Height:       59.0 in Accession #:    [id number]     Weight:       132.2 lb Date of Birth:  Jan 21, 1934      BSA:          1.547 m Patient Age:    80 years       BP:           153/102 mmHg Patient Gender: F              HR:           79 bpm. Exam Location:  Inpatient Procedure: 2D Echo, Color Doppler and Cardiac Doppler Indications:    CHF-Acute Diastolic  History:        Patient has prior history of Echocardiogram examinations. COPD,                 Signs/Symptoms:Shortness of Breath; Risk Factors:Hypertension.  Sonographer:    Clayton Lefort RDCS (AE) Referring Phys: [id number] [place]  1. Left ventricular ejection fraction, by estimation, is  25 to 30%. Left ventricular ejection fraction by 3D volume is 31 %. The left ventricle has severely decreased function. The left ventricle demonstrates global hypokinesis. There is mild left ventricular hypertrophy. Left ventricular diastolic parameters are consistent with Grade I diastolic dysfunction (impaired relaxation).  2. Right ventricular systolic function is normal. The right ventricular size is normal. There is mildly elevated pulmonary artery systolic pressure.  3. Left atrial size was severely dilated.  4. The mitral valve is degenerative. Moderate mitral valve regurgitation. No evidence of mitral stenosis. Moderate mitral annular calcification.  5. The aortic valve is calcified. Aortic valve regurgitation is trivial. Mild aortic valve sclerosis is present, with no evidence of aortic valve stenosis.  6. Aortic dilatation noted. There is mild dilatation of the ascending aorta, measuring 39 mm.  7. The inferior vena cava is normal in size with greater than 50% respiratory variability, suggesting right atrial pressure of 3 mmHg. FINDINGS  Left Ventricle: Left ventricular ejection fraction, by estimation, is 25 to 30%. Left ventricular ejection fraction by 3D volume is 31 %. The left ventricle has severely decreased function. The left ventricle demonstrates global hypokinesis. The left ventricular internal cavity size was normal in size. There is mild left ventricular hypertrophy. Left ventricular diastolic parameters are consistent with Grade I diastolic dysfunction (impaired relaxation). Right Ventricle: The right ventricular size is normal. No increase in right ventricular wall thickness. Right ventricular systolic function is normal. There is mildly elevated pulmonary artery systolic pressure. The tricuspid regurgitant velocity is 2.66  m/s, and with an assumed right atrial pressure of 8 mmHg, the estimated right ventricular systolic pressure is 83.1 mmHg. Left Atrium: Left atrial size was severely  dilated. Right Atrium: Right atrial size was normal in size. Pericardium: There is no evidence of pericardial effusion. Mitral Valve: The mitral valve is degenerative in appearance. There is moderate thickening of the mitral valve leaflet(s). There is moderate calcification of the mitral valve leaflet(s). Moderate mitral annular calcification. Moderate mitral valve regurgitation. No evidence of mitral valve stenosis. MV peak gradient, 4.2 mmHg. The mean mitral valve gradient is 1.0 mmHg. Tricuspid Valve: The tricuspid valve is  normal in structure. Tricuspid valve regurgitation is trivial. No evidence of tricuspid stenosis. Aortic Valve: The aortic valve is calcified. Aortic valve regurgitation is trivial. Aortic regurgitation PHT measures 465 msec. Mild aortic valve sclerosis is present, with no evidence of aortic valve stenosis. Aortic valve mean gradient measures 4.0 mmHg. Aortic valve peak gradient measures 7.7 mmHg. Aortic valve area, by VTI measures 1.93 cm. Pulmonic Valve: The pulmonic valve was normal in structure. Pulmonic valve regurgitation is not visualized. No evidence of pulmonic stenosis. Aorta: The aortic root is normal in size and structure and aortic dilatation noted. There is mild dilatation of the ascending aorta, measuring 39 mm. Venous: The inferior vena cava is normal in size with greater than 50% respiratory variability, suggesting right atrial pressure of 3 mmHg. IAS/Shunts: No atrial level shunt detected by color flow Doppler.  LEFT VENTRICLE PLAX 2D LVIDd:         5.20 cm         Diastology LVIDs:         4.80 cm         LV e' medial:    4.24 cm/s LV PW:         1.60 cm         LV E/e' medial:  17.7 LV IVS:        1.30 cm         LV e' lateral:   4.79 cm/s LVOT diam:     2.20 cm         LV E/e' lateral: 15.7 LV SV:         44 LV SV Index:   28 LVOT Area:     3.80 cm        3D Volume EF                                LV 3D EF:    Left                                             ventricular                                              ejection                                             fraction by                                             3D volume                                             is 31 %.  3D Volume EF:                                3D EF:        31 %                                LV EDV:       183 ml                                LV ESV:       126 ml                                LV SV:        57 ml RIGHT VENTRICLE            IVC RV Basal diam:  3.30 cm    IVC diam: 1.30 cm RV S prime:     9.57 cm/s TAPSE (M-mode): 2.3 cm LEFT ATRIUM             Index       RIGHT ATRIUM           Index LA diam:        3.10 cm 2.00 cm/m  RA Area:     14.30 cm LA Vol (A2C):   90.0 ml 58.18 ml/m RA Volume:   30.70 ml  19.85 ml/m LA Vol (A4C):   55.4 ml 35.82 ml/m LA Biplane Vol: 71.1 ml 45.97 ml/m  AORTIC VALVE AV Area (Vmax):    1.94 cm AV Area (Vmean):   2.03 cm AV Area (VTI):     1.93 cm AV Vmax:           139.00 cm/s AV Vmean:          93.200 cm/s AV VTI:            0.226 m AV Peak Grad:      7.7 mmHg AV Mean Grad:      4.0 mmHg LVOT Vmax:         71.00 cm/s LVOT Vmean:        49.800 cm/s LVOT VTI:          0.115 m LVOT/AV VTI ratio: 0.51 AI PHT:            465 msec  AORTA Ao Root diam: 3.10 cm Ao Asc diam:  3.90 cm MITRAL VALVE                 TRICUSPID VALVE MV Area (PHT): 3.91 cm      TR Peak grad:   28.3 mmHg MV Area VTI:   1.62 cm      TR Vmax:        266.00 cm/s MV Peak grad:  4.2 mmHg MV Mean grad:  1.0 mmHg      SHUNTS MV Vmax:       1.02 m/s      Systemic VTI:  0.12 m MV Vmean:      49.3 cm/s     Systemic Diam: 2.20 cm MV Decel Time: 194 msec MR Peak grad:    119.7 mmHg MR Mean grad:    65.0 mmHg MR Vmax:  547.00 cm/s MR Vmean:        369.0 cm/s MR PISA:         3.08 cm MR PISA Eff ROA: 22 mm MR PISA Radius:  0.70 cm MV E velocity: 75.00 cm/s MV A velocity: 81.80 cm/s MV E/A ratio:  0.92 Candee Furbish MD Electronically signed by Candee Furbish  MD Signature Date/Time: 12/16/2020/2:19:56 PM    Final    VAS [place] LOWER EXTREMITY VENOUS (DVT) (ONLY [facility] & [facility])  Result Date: 12/16/2020  Lower Venous DVT Study Indications: Swelling.  Risk Factors: None identified. Comparison Study: No prior studies. Performing Technologist: Oliver Hum RVT  Examination Guidelines: A complete evaluation includes B-mode imaging, spectral Doppler, color Doppler, and power Doppler as needed of all accessible portions of each vessel. Bilateral testing is considered an integral part of a complete examination. Limited examinations for reoccurring indications may be performed as noted. The reflux portion of the exam is performed with the patient in reverse Trendelenburg.  +-----+---------------+---------+-----------+----------+--------------+ RIGHTCompressibilityPhasicitySpontaneityPropertiesThrombus Aging +-----+---------------+---------+-----------+----------+--------------+ CFV  Full           Yes      Yes                                 +-----+---------------+---------+-----------+----------+--------------+   +---------+---------------+---------+-----------+----------+--------------+ LEFT     CompressibilityPhasicitySpontaneityPropertiesThrombus Aging +---------+---------------+---------+-----------+----------+--------------+ CFV      Full           Yes      Yes                                 +---------+---------------+---------+-----------+----------+--------------+ SFJ      Full                                                        +---------+---------------+---------+-----------+----------+--------------+ FV Prox  Full                                                        +---------+---------------+---------+-----------+----------+--------------+ FV Mid   Full                                                        +---------+---------------+---------+-----------+----------+--------------+ FV DistalFull                                                         +---------+---------------+---------+-----------+----------+--------------+ PFV      Full                                                        +---------+---------------+---------+-----------+----------+--------------+  POP      Full           Yes      Yes                                 +---------+---------------+---------+-----------+----------+--------------+ PTV      Full                                                        +---------+---------------+---------+-----------+----------+--------------+ PERO     Full                                                        +---------+---------------+---------+-----------+----------+--------------+     Summary: RIGHT: - No evidence of common femoral vein obstruction.  LEFT: - There is no evidence of deep vein thrombosis in the lower extremity.  - No cystic structure found in the popliteal fossa.  *See table(s) above for measurements and observations. Electronically signed by Jamelle Haring on 12/16/2020 at 8:10:31 PM.    Final         Scheduled Meds: . aspirin EC  81 mg Oral QHS  . enoxaparin (LOVENOX) injection  40 mg Subcutaneous Q24H  . furosemide  40 mg Intravenous Q12H  . guaiFENesin  600 mg Oral BID  . hydrALAZINE  10 mg Oral Q8H  . ipratropium-albuterol  3 mL Nebulization Q6H  . isosorbide mononitrate  30 mg Oral Daily  . losartan  100 mg Oral Daily  . potassium chloride  40 mEq Oral BID  . pravastatin  40 mg Oral q1800  . sodium chloride flush  3 mL Intravenous Q12H  . spironolactone  25 mg Oral Daily  . venlafaxine XR  75 mg Oral QPC breakfast   Continuous Infusions: . sodium chloride       LOS: 2 days    Time spent: 35 minutes.     Elmarie Shiley, MD [facility]   If 7PM-7AM, please contact night-coverage [email or web address]  12/17/2020, 7:44 AM

## 2020-12-17 NOTE — Progress Notes (Signed)
Physical Therapy Treatment Patient Details Name: [name] MRN: [id number] DOB: 10-01-1934 Today's Date: 12/17/2020    History of Present Illness Pt is an 87 y/o female admitted secondary to SOB and BLE swelling. Thought to be secondary to acute CHF. PMH includes HTN and COPD.    PT Comments    Pt in bed on entry tangled up in her call bell, and somewhat upset because she was not given breakfast or lunch. When told her she was having a procedure she wasn't aware of it, but "I wish they would get on with it". Agreeable to walk with therapy to pass time. Pt is supervision for bed mobility, and min guard for transfers and ambulation with RW. At end of session, walking into her room pt incontinent of stool and unaware. Pt able to maintain SaO2 >90%O2 on 4L O2. Cleaned her up and transport to cath lab arrived. D/c plans remain appropriate. PT will continue to follow acutely to progress mobility.   Follow Up Recommendations  Home health PT;Supervision for mobility/OOB     Equipment Recommendations  None recommended by PT       Precautions / Restrictions Precautions Precautions: Fall Precaution Comments: watch SpO2 Restrictions Weight Bearing Restrictions: No    Mobility  Bed Mobility Overal bed mobility: Needs Assistance Bed Mobility: Supine to Sit;Sit to Supine     Supine to sit: Supervision     General bed mobility comments: Supervision for safety.    Transfers Overall transfer level: Needs assistance Equipment used: Rolling walker (2 wheeled) Transfers: Sit to/from Stand Sit to Stand: Min guard         General transfer comment: Min guard for safety.  Ambulation/Gait Ambulation/Gait assistance: Min guard Gait Distance (Feet): 40 Feet Assistive device: Rolling walker (2 wheeled) Gait Pattern/deviations: Step-through pattern;Decreased stride length Gait velocity: Decreased Gait velocity interpretation: <1.31 ft/sec, indicative of household ambulator General  Gait Details: min guard for safety, vc for proximity to RW, 1x standing rest break, incontinence of stool while walking back into room, pt unaware       Balance Overall balance assessment: Needs assistance Sitting-balance support: Feet supported;No upper extremity supported Sitting balance-Leahy Scale: Good     Standing balance support: Bilateral upper extremity supported;During functional activity Standing balance-Leahy Scale: Poor Standing balance comment: Reliant on BUE support                            Cognition Arousal/Alertness: Awake/alert Behavior During Therapy: WFL for tasks assessed/performed Overall Cognitive Status: Impaired/Different from baseline Area of Impairment: Memory                     Memory: Decreased short-term memory         General Comments: pt upset that she has not had any breakfast and she does not know why, when told she is having cardiac catheterization pt unsure what that is         General Comments General comments (skin integrity, edema, etc.): Pt on 4L O2 via [place] SaO2>90%O2 throughout gait,      Pertinent Vitals/Pain Pain Assessment: No/denies pain           PT Goals (current goals can now be found in the care plan section) Acute Rehab PT Goals Patient Stated Goal: to feel better PT Goal Formulation: With patient Time For Goal Achievement: 12/30/20 Potential to Achieve Goals: Good Progress towards PT goals: Progressing toward goals    Frequency  Min 3X/week      PT Plan Current plan remains appropriate       AM-PAC PT "6 Clicks" Mobility   Outcome Measure  Help needed turning from your back to your side while in a flat bed without using bedrails?: None Help needed moving from lying on your back to sitting on the side of a flat bed without using bedrails?: A Little Help needed moving to and from a bed to a chair (including a wheelchair)?: A Little Help needed standing up from a chair using your  arms (e.g., wheelchair or bedside chair)?: A Little Help needed to walk in hospital room?: A Little Help needed climbing 3-5 steps with a railing? : A Little 6 Click Score: 19    End of Session Equipment Utilized During Treatment: Gait belt Activity Tolerance: Patient tolerated treatment well Patient left: in bed;with call bell/phone within reach (on stretcher in ED) Nurse Communication: Mobility status PT Visit Diagnosis: Other abnormalities of gait and mobility (R26.89)     Time: 1340-1355 PT Time Calculation (min) (ACUTE ONLY): 15 min  Charges:  $Gait Training: 8-22 mins                     Neira Bentsen B. Migdalia Dk PT, DPT Acute Rehabilitation Services Pager [phone] Office [phone]    [place] 12/17/2020, 2:23 PM

## 2020-12-17 NOTE — Progress Notes (Signed)
TR Band removed, gauze 2x2 and Tegaderm applied.no bleeding noted.

## 2020-12-17 NOTE — H&P (View-Only) (Signed)
Progress Note  Patient Name: [name] Date of Encounter: 12/17/2020  [facility] Cardiologist: No primary care provider on file.   Subjective   Reports dyspnea improved this morning.  Able to lie flat without shortness of breath or hypoxia  Inpatient Medications    Scheduled Meds: . aspirin EC  81 mg Oral QHS  . enoxaparin (LOVENOX) injection  40 mg Subcutaneous Q24H  . furosemide  40 mg Intravenous Q12H  . guaiFENesin  600 mg Oral BID  . hydrALAZINE  10 mg Oral Q8H  . ipratropium-albuterol  3 mL Nebulization Q6H  . isosorbide mononitrate  30 mg Oral Daily  . losartan  100 mg Oral Daily  . potassium chloride  40 mEq Oral BID  . pravastatin  40 mg Oral q1800  . sodium chloride flush  3 mL Intravenous Q12H  . spironolactone  25 mg Oral Daily  . venlafaxine XR  75 mg Oral QPC breakfast   Continuous Infusions: . sodium chloride     PRN Meds: sodium chloride, acetaminophen, ALPRAZolam, docusate sodium, hydrALAZINE, ondansetron (ZOFRAN) IV, sodium chloride flush   Vital Signs    Vitals:   12/17/20 0301 12/17/20 0407 12/17/20 0600 12/17/20 0743  BP: 117/78 103/74 136/86 126/74  Pulse: 62 65  68  Resp: 20 20 18 19   Temp:  97.7 F (36.5 C)  97.7 F (36.5 C)  TempSrc:  Oral  Axillary  SpO2: 97% 97% 96% 97%  Weight:  53.6 kg      Intake/Output Summary (Last 24 hours) at 12/17/2020 0821 Last data filed at 12/17/2020 0704 Gross per 24 hour  Intake --  Output 2200 ml  Net -2200 ml   Last 3 Weights 12/17/2020 04/16/2018 10/05/2017  Weight (lbs) 118 lb 2.7 oz 132 lb 3.2 oz 133 lb  Weight (kg) 53.6 kg 59.966 kg 60.328 kg      Telemetry    Normal sinus rhythm with PVCs- Personally Reviewed  ECG    No new ECG- Personally Reviewed  Physical Exam   GEN: No acute distress.   Neck: + JVD Cardiac: RRR, no murmurs Respiratory: Bibasilar crackles GI: Soft, nontender, non-distended  MS: No edema Neuro:  Nonfocal  Psych: Normal affect   Labs    High  Sensitivity Troponin:   Recent Labs  Lab 12/15/20 1514  TROPONINIHS 58*      Chemistry Recent Labs  Lab 12/15/20 1514 12/16/20 0423 12/17/20 0009  NA 146* 144 144  K 2.7* 3.5 3.7  CL 104 104 104  CO2 30 28 30   GLUCOSE 147* 122* 101*  BUN 10 12 14   CREATININE 0.91 0.88 0.87  CALCIUM 9.2 9.0 8.6*  GFRNONAA >60 >60 >60  ANIONGAP 12 12 10      Hematology Recent Labs  Lab 12/15/20 1514  WBC 9.7  RBC 4.88  HGB 15.0  HCT 48.0*  MCV 98.4  MCH 30.7  MCHC 31.3  RDW 15.8*  PLT 171    BNP Recent Labs  Lab 12/15/20 1514  BNP 3,235.0*     DDimer No results for input(s): DDIMER in the last 168 hours.   Radiology    DG Chest 2 View  Result Date: 12/15/2020 CLINICAL DATA:  87 year old female with shortness of breath. EXAM: CHEST - 2 VIEW COMPARISON:  Chest radiograph dated 03/05/2013 FINDINGS: Small bilateral pleural effusions, left greater right with associated bibasilar compressive atelectasis. Pneumonia is not excluded clinical correlation is recommended. There is diffuse interstitial and vascular prominence consistent with edema. No  pneumothorax. There is cardiomegaly. Atherosclerotic calcification of the aorta. Osteopenia with scoliosis and degenerative changes of the spine. A 16 mm ovoid radiopaque focus over the spine on the lateral view is not identified on the frontal projection. No acute osseous pathology. IMPRESSION: CHF with small bilateral pleural effusions and bibasilar atelectasis. Pneumonia is not excluded. Electronically Signed   By: Anner Crete M.D.   On: 12/15/2020 15:56   DG Chest Port 1 View  Result Date: 12/16/2020 CLINICAL DATA:  CHF EXAM: PORTABLE CHEST 1 VIEW COMPARISON:  Radiograph 12/15/2020, CT 04/30/2018 FINDINGS: Some increasing hazy interstitial opacities throughout the lungs on a background of more chronic interstitial and bronchitic change with basilar scarring. Some more dense opacity is noted in gradient fashion towards the lung bases  could reflect developing alveolar edema or layering pleural effusion. No pneumothorax. Cardiomegaly is similar to priors with a calcified aorta. No acute osseous or soft tissue abnormality. Degenerative changes are present in the imaged spine and shoulders. Dextrocurvature of the thoracic levels is similar to prior studies. Telemetry leads overlie the chest. IMPRESSION: 1. Increasing hazy interstitial opacities throughout the lungs likely reflecting edema on a background of more chronic interstitial and bronchitic change with basilar scarring. 2. More dense opacity in gradient towards the lung bases could reflect developing alveolar edema or layering pleural effusion. Infection less favored in the absence of clinical symptoms. Electronically Signed   By: Lovena Le M.D.   On: 12/16/2020 05:06   ECHOCARDIOGRAM COMPLETE  Result Date: 12/16/2020    ECHOCARDIOGRAM REPORT   Patient Name:   [name] Date of Exam: 12/16/2020 Medical Rec #:  [id number]      Height:       59.0 in Accession #:    [id number]     Weight:       132.2 lb Date of Birth:  [birth year]      BSA:          1.547 m Patient Age:    87 years       BP:           153/102 mmHg Patient Gender: F              HR:           79 bpm. Exam Location:  Inpatient Procedure: 2D Echo, Color Doppler and Cardiac Doppler Indications:    CHF-Acute Diastolic  History:        Patient has prior history of Echocardiogram examinations. COPD,                 Signs/Symptoms:Shortness of Breath; Risk Factors:Hypertension.  Sonographer:    Clayton Lefort RDCS (AE) Referring Phys: [id number] [place]  1. Left ventricular ejection fraction, by estimation, is 25 to 30%. Left ventricular ejection fraction by 3D volume is 31 %. The left ventricle has severely decreased function. The left ventricle demonstrates global hypokinesis. There is mild left ventricular hypertrophy. Left ventricular diastolic parameters are consistent with Grade I diastolic dysfunction (impaired  relaxation).  2. Right ventricular systolic function is normal. The right ventricular size is normal. There is mildly elevated pulmonary artery systolic pressure.  3. Left atrial size was severely dilated.  4. The mitral valve is degenerative. Moderate mitral valve regurgitation. No evidence of mitral stenosis. Moderate mitral annular calcification.  5. The aortic valve is calcified. Aortic valve regurgitation is trivial. Mild aortic valve sclerosis is present, with no evidence of aortic valve stenosis.  6. Aortic dilatation noted. There  is mild dilatation of the ascending aorta, measuring 39 mm.  7. The inferior vena cava is normal in size with greater than 50% respiratory variability, suggesting right atrial pressure of 3 mmHg. FINDINGS  Left Ventricle: Left ventricular ejection fraction, by estimation, is 25 to 30%. Left ventricular ejection fraction by 3D volume is 31 %. The left ventricle has severely decreased function. The left ventricle demonstrates global hypokinesis. The left ventricular internal cavity size was normal in size. There is mild left ventricular hypertrophy. Left ventricular diastolic parameters are consistent with Grade I diastolic dysfunction (impaired relaxation). Right Ventricle: The right ventricular size is normal. No increase in right ventricular wall thickness. Right ventricular systolic function is normal. There is mildly elevated pulmonary artery systolic pressure. The tricuspid regurgitant velocity is 2.66  m/s, and with an assumed right atrial pressure of 8 mmHg, the estimated right ventricular systolic pressure is 91.9 mmHg. Left Atrium: Left atrial size was severely dilated. Right Atrium: Right atrial size was normal in size. Pericardium: There is no evidence of pericardial effusion. Mitral Valve: The mitral valve is degenerative in appearance. There is moderate thickening of the mitral valve leaflet(s). There is moderate calcification of the mitral valve leaflet(s). Moderate  mitral annular calcification. Moderate mitral valve regurgitation. No evidence of mitral valve stenosis. MV peak gradient, 4.2 mmHg. The mean mitral valve gradient is 1.0 mmHg. Tricuspid Valve: The tricuspid valve is normal in structure. Tricuspid valve regurgitation is trivial. No evidence of tricuspid stenosis. Aortic Valve: The aortic valve is calcified. Aortic valve regurgitation is trivial. Aortic regurgitation PHT measures 465 msec. Mild aortic valve sclerosis is present, with no evidence of aortic valve stenosis. Aortic valve mean gradient measures 4.0 mmHg. Aortic valve peak gradient measures 7.7 mmHg. Aortic valve area, by VTI measures 1.93 cm. Pulmonic Valve: The pulmonic valve was normal in structure. Pulmonic valve regurgitation is not visualized. No evidence of pulmonic stenosis. Aorta: The aortic root is normal in size and structure and aortic dilatation noted. There is mild dilatation of the ascending aorta, measuring 39 mm. Venous: The inferior vena cava is normal in size with greater than 50% respiratory variability, suggesting right atrial pressure of 3 mmHg. IAS/Shunts: No atrial level shunt detected by color flow Doppler.  LEFT VENTRICLE PLAX 2D LVIDd:         5.20 cm         Diastology LVIDs:         4.80 cm         LV e' medial:    4.24 cm/s LV PW:         1.60 cm         LV E/e' medial:  17.7 LV IVS:        1.30 cm         LV e' lateral:   4.79 cm/s LVOT diam:     2.20 cm         LV E/e' lateral: 15.7 LV SV:         44 LV SV Index:   28 LVOT Area:     3.80 cm        3D Volume EF                                LV 3D EF:    Left  ventricular                                             ejection                                             fraction by                                             3D volume                                             is 31 %.                                 3D Volume EF:                                3D EF:        31 %                                 LV EDV:       183 ml                                LV ESV:       126 ml                                LV SV:        57 ml RIGHT VENTRICLE            IVC RV Basal diam:  3.30 cm    IVC diam: 1.30 cm RV S prime:     9.57 cm/s TAPSE (M-mode): 2.3 cm LEFT ATRIUM             Index       RIGHT ATRIUM           Index LA diam:        3.10 cm 2.00 cm/m  RA Area:     14.30 cm LA Vol (A2C):   90.0 ml 58.18 ml/m RA Volume:   30.70 ml  19.85 ml/m LA Vol (A4C):   55.4 ml 35.82 ml/m LA Biplane Vol: 71.1 ml 45.97 ml/m  AORTIC VALVE AV Area (Vmax):    1.94 cm AV Area (Vmean):   2.03 cm AV Area (VTI):     1.93 cm AV Vmax:           139.00 cm/s AV Vmean:          93.200 cm/s AV VTI:            0.226 m AV Peak Grad:      7.7 mmHg AV Mean Grad:  4.0 mmHg LVOT Vmax:         71.00 cm/s LVOT Vmean:        49.800 cm/s LVOT VTI:          0.115 m LVOT/AV VTI ratio: 0.51 AI PHT:            465 msec  AORTA Ao Root diam: 3.10 cm Ao Asc diam:  3.90 cm MITRAL VALVE                 TRICUSPID VALVE MV Area (PHT): 3.91 cm      TR Peak grad:   28.3 mmHg MV Area VTI:   1.62 cm      TR Vmax:        266.00 cm/s MV Peak grad:  4.2 mmHg MV Mean grad:  1.0 mmHg      SHUNTS MV Vmax:       1.02 m/s      Systemic VTI:  0.12 m MV Vmean:      49.3 cm/s     Systemic Diam: 2.20 cm MV Decel Time: 194 msec MR Peak grad:    119.7 mmHg MR Mean grad:    65.0 mmHg MR Vmax:         547.00 cm/s MR Vmean:        369.0 cm/s MR PISA:         3.08 cm MR PISA Eff ROA: 22 mm MR PISA Radius:  0.70 cm MV E velocity: 75.00 cm/s MV A velocity: 81.80 cm/s MV E/A ratio:  0.92 Candee Furbish MD Electronically signed by Candee Furbish MD Signature Date/Time: 12/16/2020/2:19:56 PM    Final    VAS [place] LOWER EXTREMITY VENOUS (DVT) (ONLY [facility] & [facility])  Result Date: 12/16/2020  Lower Venous DVT Study Indications: Swelling.  Risk Factors: None identified. Comparison Study: No prior studies. Performing Technologist: Oliver Hum RVT  Examination  Guidelines: A complete evaluation includes B-mode imaging, spectral Doppler, color Doppler, and power Doppler as needed of all accessible portions of each vessel. Bilateral testing is considered an integral part of a complete examination. Limited examinations for reoccurring indications may be performed as noted. The reflux portion of the exam is performed with the patient in reverse Trendelenburg.  +-----+---------------+---------+-----------+----------+--------------+ RIGHTCompressibilityPhasicitySpontaneityPropertiesThrombus Aging +-----+---------------+---------+-----------+----------+--------------+ CFV  Full           Yes      Yes                                 +-----+---------------+---------+-----------+----------+--------------+   +---------+---------------+---------+-----------+----------+--------------+ LEFT     CompressibilityPhasicitySpontaneityPropertiesThrombus Aging +---------+---------------+---------+-----------+----------+--------------+ CFV      Full           Yes      Yes                                 +---------+---------------+---------+-----------+----------+--------------+ SFJ      Full                                                        +---------+---------------+---------+-----------+----------+--------------+ FV Prox  Full                                                        +---------+---------------+---------+-----------+----------+--------------+  FV Mid   Full                                                        +---------+---------------+---------+-----------+----------+--------------+ FV DistalFull                                                        +---------+---------------+---------+-----------+----------+--------------+ PFV      Full                                                        +---------+---------------+---------+-----------+----------+--------------+ POP      Full           Yes      Yes                                  +---------+---------------+---------+-----------+----------+--------------+ PTV      Full                                                        +---------+---------------+---------+-----------+----------+--------------+ PERO     Full                                                        +---------+---------------+---------+-----------+----------+--------------+     Summary: RIGHT: - No evidence of common femoral vein obstruction.  LEFT: - There is no evidence of deep vein thrombosis in the lower extremity.  - No cystic structure found in the popliteal fossa.  *See table(s) above for measurements and observations. Electronically signed by Jamelle Haring on 12/16/2020 at 8:10:31 PM.    Final     Cardiac Studies   ECHO: 12/16/2020  1. Left ventricular ejection fraction, by estimation, is 25 to 30%. Left  ventricular ejection fraction by 3D volume is 31 %. The left ventricle has  severely decreased function. The left ventricle demonstrates global  hypokinesis. There is mild left ventricular hypertrophy. Left ventricular diastolic parameters are consistent with Grade I diastolic dysfunction (impaired relaxation).  2. Right ventricular systolic function is normal. The right ventricular  size is normal. There is mildly elevated pulmonary artery systolic  pressure.  3. Left atrial size was severely dilated.  4. The mitral valve is degenerative. Moderate mitral valve regurgitation.  No evidence of mitral stenosis. Moderate mitral annular calcification.  5. The aortic valve is calcified. Aortic valve regurgitation is trivial.  Mild aortic valve sclerosis is present, with no evidence of aortic valve  stenosis.  6. Aortic dilatation noted. There is mild dilatation of the ascending  aorta, measuring 39 mm.  7. The inferior vena cava is normal in size  with greater than 50%  respiratory variability, suggesting right atrial pressure of 3 mmHg.   Patient  Profile     87 y.o. female with a hx of HTN, HLD, COPD, squamous cell skin CA (perianal), anxiety/depression, hypokalemia, who is being seen today for the evaluation of CHF   Assessment & Plan    Acute combined systolic and diastolic heart failure: Presented with shortness of breath and volume overload.  Echocardiogram showed LVEF 25 to 30%, new diagnosis.  Unclear cause.  Will need to rule out ischemia.  Has been significantly hypertensive, could represent hypertensive cardiomyopathy -Respiratory status appears improved today, she is able to lie flat without dyspnea or hypoxia.  Recommend RHC/LHC to evaluate for obstructive CAD as the cause of her cardiomyopathy.  Risks and benefits of cardiac catheterization have been discussed with the patient.  These include bleeding, infection, kidney damage, stroke, heart attack, death.  The patient understands these risks and is willing to proceed.  She did request that I speak to her daughter as well.  I called her daughter [name], and she was in agreement with proceeding with cath. -Continue IV Lasix 40 mg twice daily, will follow up results of RHC -Continue losartan 100 mg daily, transition to Entresto if stable renal function following cath -Continue spironolactone 25 mg daily -Continue Imdur/hydralazine  Hypertension: BP has been elevated to 160s over 110s.  Improved to 126/74 this morning -Continue losartan, spironolactone, Imdur/hydralazine as above -Given her issues with hypokalemia that preceded diuretic use, ordered renin/aldosterone levels to evaluate for hyperaldosteronism  Hypokalemia: Potassium 2.7 on admission.  Continue aggressive potassium repletion while diuresing.  Evaluate for hyperaldosteronism as above  For questions or updates, please contact [place] Please consult [email or web address] for contact info under        Signed, Donato Heinz, MD  12/17/2020, 8:21 AM

## 2020-12-17 NOTE — Progress Notes (Signed)
TR Band started bleeding after few min of deflating 3 ml of air. Inflated  with 2ml of air total of 13 ml . Continue to monitor. Started deflating  after 30 min  till  air is completely out.  No further bleeding noted.

## 2020-12-18 DIAGNOSIS — I429 Cardiomyopathy, unspecified: Secondary | ICD-10-CM

## 2020-12-18 DIAGNOSIS — I2510 Atherosclerotic heart disease of native coronary artery without angina pectoris: Secondary | ICD-10-CM

## 2020-12-18 DIAGNOSIS — I10 Essential (primary) hypertension: Secondary | ICD-10-CM | POA: Diagnosis not present

## 2020-12-18 DIAGNOSIS — I5041 Acute combined systolic (congestive) and diastolic (congestive) heart failure: Secondary | ICD-10-CM | POA: Diagnosis not present

## 2020-12-18 LAB — BASIC METABOLIC PANEL
Anion gap: 10 (ref 5–15)
BUN: 19 mg/dL (ref 8–23)
CO2: 31 mmol/L (ref 22–32)
Calcium: 8.9 mg/dL (ref 8.9–10.3)
Chloride: 102 mmol/L (ref 98–111)
Creatinine, Ser: 0.81 mg/dL (ref 0.44–1.00)
GFR, Estimated: >60 mL/min (ref >60)
Glucose, Bld: 89 mg/dL (ref 70–99)
Potassium: 3.9 mmol/L (ref 3.5–5.1)
Sodium: 143 mmol/L (ref 135–145)

## 2020-12-18 LAB — VITAMIN D 25 HYDROXY (VIT D DEFICIENCY, FRACTURES): Vit D, 25-Hydroxy: 19.88 ng/mL — ABNORMAL LOW (ref 30–100)

## 2020-12-18 LAB — VITAMIN B12: Vitamin B-12: 223 pg/mL (ref 180–914)

## 2020-12-18 MED ORDER — FUROSEMIDE 10 MG/ML IJ SOLN
80.0000 mg | Freq: Two times a day (BID) | INTRAMUSCULAR | Status: DC
  Administered 2020-12-18 – 2020-12-19 (×2): 80 mg via INTRAVENOUS
  Filled 2020-12-18 (×2): qty 8

## 2020-12-18 MED ORDER — VITAMIN D (ERGOCALCIFEROL) 1.25 MG (50000 UNIT) PO CAPS
50000.0000 [IU] | ORAL_CAPSULE | ORAL | Status: DC
  Administered 2020-12-18 – 2020-12-25 (×2): 50000 [IU] via ORAL
  Filled 2020-12-18 (×2): qty 1

## 2020-12-18 MED ORDER — POTASSIUM CHLORIDE CRYS ER 20 MEQ PO TBCR
20.0000 meq | EXTENDED_RELEASE_TABLET | Freq: Once | ORAL | Status: AC
  Administered 2020-12-18: 20 meq via ORAL
  Filled 2020-12-18: qty 1

## 2020-12-18 MED ORDER — VITAMIN B-12 1000 MCG PO TABS
1000.0000 ug | ORAL_TABLET | Freq: Once | ORAL | Status: AC
  Administered 2020-12-18: 1000 ug via ORAL
  Filled 2020-12-18: qty 1

## 2020-12-18 MED ORDER — IPRATROPIUM-ALBUTEROL 0.5-2.5 (3) MG/3ML IN SOLN: 3.0000 mL | Freq: Four times a day (QID) | RESPIRATORY_TRACT | Status: DC | PRN

## 2020-12-18 MED ORDER — FUROSEMIDE 10 MG/ML IJ SOLN: 60.0000 mg | Freq: Two times a day (BID) | INTRAMUSCULAR | Status: DC

## 2020-12-18 MED ORDER — VITAMIN B-12 100 MCG PO TABS
100.0000 ug | ORAL_TABLET | Freq: Every day | ORAL | Status: DC
  Administered 2020-12-19 – 2020-12-27 (×9): 100 ug via ORAL
  Filled 2020-12-18 (×9): qty 1

## 2020-12-18 NOTE — Progress Notes (Signed)
Progress Note  Patient Name: [name] Date of Encounter: 12/18/2020  Primary Cardiologist: Gardiner Rhyme- new  Subjective   Feels rotten- feels week and thinks she she need a lot more physical support when leaving the hospital.  Doesn't feel improvement from SOB; doesn't remember PND or orthopnea.  CP free.  Inpatient Medications    Scheduled Meds: . aspirin EC  81 mg Oral QHS  . enoxaparin (LOVENOX) injection  40 mg Subcutaneous Q24H  . furosemide  40 mg Intravenous Q12H  . guaiFENesin  600 mg Oral BID  . hydrALAZINE  10 mg Oral Q8H  . isosorbide mononitrate  30 mg Oral Daily  . losartan  100 mg Oral Daily  . pravastatin  40 mg Oral q1800  . sodium chloride flush  3 mL Intravenous Q12H  . sodium chloride flush  3 mL Intravenous Q12H  . sodium chloride flush  3 mL Intravenous Q12H  . spironolactone  25 mg Oral Daily  . venlafaxine XR  75 mg Oral QPC breakfast  . [START ON 12/19/2020] vitamin B-12  100 mcg Oral Daily  . Vitamin D (Ergocalciferol)  50,000 Units Oral Q7 days   Continuous Infusions: . sodium chloride    . sodium chloride     PRN Meds: sodium chloride, sodium chloride, acetaminophen, ALPRAZolam, docusate sodium, hydrALAZINE, ipratropium-albuterol, ondansetron (ZOFRAN) IV, sodium chloride flush, sodium chloride flush   Vital Signs    Vitals:   12/18/20 0000 12/18/20 0300 12/18/20 0500 12/18/20 0753  BP: 137/78 132/79  132/74  Pulse:  65  66  Resp: 18 20  15   Temp:  97.6 F (36.4 C)  97.6 F (36.4 C)  TempSrc:  Oral  Oral  SpO2: 93% 94%  97%  Weight:   52.3 kg   Height:        Intake/Output Summary (Last 24 hours) at 12/18/2020 0900 Last data filed at 12/18/2020 0800 Gross per 24 hour  Intake 480 ml  Output 2050 ml  Net -1570 ml   Filed Weights   12/17/20 0407 12/18/20 0500  Weight: 53.6 kg 52.3 kg    Telemetry    Occasional PVC VTach listed is actually artifact - Personally Reviewed  ECG    No new - Personally Reviewed  Physical  Exam   GEN: No acute distress.   Neck: No JVD at 30 degrees Cardiac: RRR, no murmurs, rubs, or gallops. Cath site R radial without hematoma or bruit Respiratory: Faint crackles with poor air movement GI: Soft, nontender, non-distended  MS: No edema; No deformity. Neuro:  Nonfocal  Psych: Normal affect   Weaned to 1 L O2 O2 sat 95 through exam  Labs    Chemistry Recent Labs  Lab 12/16/20 0423 12/17/20 0009 12/17/20 1452 12/17/20 1455 12/17/20 1820 12/18/20 0055  NA 144 144 145 145  --  143  K 3.5 3.7 4.2 4.1  --  3.9  CL 104 104  --   --   --  102  CO2 28 30  --   --   --  31  GLUCOSE 122* 101*  --   --   --  89  BUN 12 14  --   --   --  19  CREATININE 0.88 0.87  --   --  0.84 0.81  CALCIUM 9.0 8.6*  --   --   --  8.9  GFRNONAA >60 >60  --   --  >60 >60  ANIONGAP 12 10  --   --   --  10     Hematology Recent Labs  Lab 12/15/20 1514 12/17/20 1452 12/17/20 1455 12/17/20 1820  WBC 9.7  --   --  10.1  RBC 4.88  --   --  4.59  HGB 15.0 14.6 14.3 14.3  HCT 48.0* 43.0 42.0 43.9  MCV 98.4  --   --  95.6  MCH 30.7  --   --  31.2  MCHC 31.3  --   --  32.6  RDW 15.8*  --   --  16.0*  PLT 171  --   --  193    Cardiac EnzymesNo results for input(s): TROPONINI in the last 168 hours. No results for input(s): TROPIPOC in the last 168 hours.   BNP Recent Labs  Lab 12/15/20 1514  BNP 3,235.0*     DDimer No results for input(s): DDIMER in the last 168 hours.   Radiology    CARDIAC CATHETERIZATION  Result Date: 12/17/2020  Prox LAD to Mid LAD lesion is 20% stenosed.  Prox RCA lesion is 25% stenosed.  LV end diastolic pressure is mildly elevated.  Hemodynamic findings consistent with mild pulmonary hypertension.  1. Minimal nonobstructive CAD 2. Mildly elevated LVEDP 3. Mild pulmonary HTN. Mean PAP 22 mm Hg 4. Reduced cardiac output 2.6 L/min with index 1.72 Plan: medical management.   ECHOCARDIOGRAM COMPLETE  Result Date: 12/16/2020    ECHOCARDIOGRAM REPORT    Patient Name:   [name] Date of Exam: 12/16/2020 Medical Rec #:  [id number]      Height:       59.0 in Accession #:    [id number]     Weight:       132.2 lb Date of Birth:  1934-04-15      BSA:          1.547 m Patient Age:    87 years       BP:           153/102 mmHg Patient Gender: F              HR:           79 bpm. Exam Location:  Inpatient Procedure: 2D Echo, Color Doppler and Cardiac Doppler Indications:    CHF-Acute Diastolic  History:        Patient has prior history of Echocardiogram examinations. COPD,                 Signs/Symptoms:Shortness of Breath; Risk Factors:Hypertension.  Sonographer:    Clayton Lefort RDCS (AE) Referring Phys: [id number] [place]  1. Left ventricular ejection fraction, by estimation, is 25 to 30%. Left ventricular ejection fraction by 3D volume is 31 %. The left ventricle has severely decreased function. The left ventricle demonstrates global hypokinesis. There is mild left ventricular hypertrophy. Left ventricular diastolic parameters are consistent with Grade I diastolic dysfunction (impaired relaxation).  2. Right ventricular systolic function is normal. The right ventricular size is normal. There is mildly elevated pulmonary artery systolic pressure.  3. Left atrial size was severely dilated.  4. The mitral valve is degenerative. Moderate mitral valve regurgitation. No evidence of mitral stenosis. Moderate mitral annular calcification.  5. The aortic valve is calcified. Aortic valve regurgitation is trivial. Mild aortic valve sclerosis is present, with no evidence of aortic valve stenosis.  6. Aortic dilatation noted. There is mild dilatation of the ascending aorta, measuring 39 mm.  7. The inferior vena cava is normal in size with greater than 50% respiratory  variability, suggesting right atrial pressure of 3 mmHg. FINDINGS  Left Ventricle: Left ventricular ejection fraction, by estimation, is 25 to 30%. Left ventricular ejection fraction by 3D volume is 31 %.  The left ventricle has severely decreased function. The left ventricle demonstrates global hypokinesis. The left ventricular internal cavity size was normal in size. There is mild left ventricular hypertrophy. Left ventricular diastolic parameters are consistent with Grade I diastolic dysfunction (impaired relaxation). Right Ventricle: The right ventricular size is normal. No increase in right ventricular wall thickness. Right ventricular systolic function is normal. There is mildly elevated pulmonary artery systolic pressure. The tricuspid regurgitant velocity is 2.66  m/s, and with an assumed right atrial pressure of 8 mmHg, the estimated right ventricular systolic pressure is 93.7 mmHg. Left Atrium: Left atrial size was severely dilated. Right Atrium: Right atrial size was normal in size. Pericardium: There is no evidence of pericardial effusion. Mitral Valve: The mitral valve is degenerative in appearance. There is moderate thickening of the mitral valve leaflet(s). There is moderate calcification of the mitral valve leaflet(s). Moderate mitral annular calcification. Moderate mitral valve regurgitation. No evidence of mitral valve stenosis. MV peak gradient, 4.2 mmHg. The mean mitral valve gradient is 1.0 mmHg. Tricuspid Valve: The tricuspid valve is normal in structure. Tricuspid valve regurgitation is trivial. No evidence of tricuspid stenosis. Aortic Valve: The aortic valve is calcified. Aortic valve regurgitation is trivial. Aortic regurgitation PHT measures 465 msec. Mild aortic valve sclerosis is present, with no evidence of aortic valve stenosis. Aortic valve mean gradient measures 4.0 mmHg. Aortic valve peak gradient measures 7.7 mmHg. Aortic valve area, by VTI measures 1.93 cm. Pulmonic Valve: The pulmonic valve was normal in structure. Pulmonic valve regurgitation is not visualized. No evidence of pulmonic stenosis. Aorta: The aortic root is normal in size and structure and aortic dilatation noted.  There is mild dilatation of the ascending aorta, measuring 39 mm. Venous: The inferior vena cava is normal in size with greater than 50% respiratory variability, suggesting right atrial pressure of 3 mmHg. IAS/Shunts: No atrial level shunt detected by color flow Doppler.  LEFT VENTRICLE PLAX 2D LVIDd:         5.20 cm         Diastology LVIDs:         4.80 cm         LV e' medial:    4.24 cm/s LV PW:         1.60 cm         LV E/e' medial:  17.7 LV IVS:        1.30 cm         LV e' lateral:   4.79 cm/s LVOT diam:     2.20 cm         LV E/e' lateral: 15.7 LV SV:         44 LV SV Index:   28 LVOT Area:     3.80 cm        3D Volume EF                                LV 3D EF:    Left  ventricular                                             ejection                                             fraction by                                             3D volume                                             is 31 %.                                 3D Volume EF:                                3D EF:        31 %                                LV EDV:       183 ml                                LV ESV:       126 ml                                LV SV:        57 ml RIGHT VENTRICLE            IVC RV Basal diam:  3.30 cm    IVC diam: 1.30 cm RV S prime:     9.57 cm/s TAPSE (M-mode): 2.3 cm LEFT ATRIUM             Index       RIGHT ATRIUM           Index LA diam:        3.10 cm 2.00 cm/m  RA Area:     14.30 cm LA Vol (A2C):   90.0 ml 58.18 ml/m RA Volume:   30.70 ml  19.85 ml/m LA Vol (A4C):   55.4 ml 35.82 ml/m LA Biplane Vol: 71.1 ml 45.97 ml/m  AORTIC VALVE AV Area (Vmax):    1.94 cm AV Area (Vmean):   2.03 cm AV Area (VTI):     1.93 cm AV Vmax:           139.00 cm/s AV Vmean:          93.200 cm/s AV VTI:            0.226 m AV Peak Grad:      7.7 mmHg AV Mean Grad:  4.0 mmHg LVOT Vmax:         71.00 cm/s LVOT Vmean:        49.800 cm/s LVOT VTI:          0.115 m LVOT/AV VTI  ratio: 0.51 AI PHT:            465 msec  AORTA Ao Root diam: 3.10 cm Ao Asc diam:  3.90 cm MITRAL VALVE                 TRICUSPID VALVE MV Area (PHT): 3.91 cm      TR Peak grad:   28.3 mmHg MV Area VTI:   1.62 cm      TR Vmax:        266.00 cm/s MV Peak grad:  4.2 mmHg MV Mean grad:  1.0 mmHg      SHUNTS MV Vmax:       1.02 m/s      Systemic VTI:  0.12 m MV Vmean:      49.3 cm/s     Systemic Diam: 2.20 cm MV Decel Time: 194 msec MR Peak grad:    119.7 mmHg MR Mean grad:    65.0 mmHg MR Vmax:         547.00 cm/s MR Vmean:        369.0 cm/s MR PISA:         3.08 cm MR PISA Eff ROA: 22 mm MR PISA Radius:  0.70 cm MV E velocity: 75.00 cm/s MV A velocity: 81.80 cm/s MV E/A ratio:  0.92 Candee Furbish MD Electronically signed by Candee Furbish MD Signature Date/Time: 12/16/2020/2:19:56 PM    Final     Cardiac Studies    Prox LAD to Mid LAD lesion is 20% stenosed.  Prox RCA lesion is 25% stenosed.  LV end diastolic pressure is mildly elevated.  Hemodynamic findings consistent with mild pulmonary hypertension.   1. Minimal nonobstructive CAD 2. Mildly elevated LVEDP 3. Mild pulmonary HTN. Mean PAP 22 mm Hg 4. Reduced cardiac output 2.6 L/min with index 1.72   Patient Profile     87 y.o. female with a hx of HTN, HLD, COPD, squamous cell skin CA (perianal), anxiety/depression, low Na+ 2nd HCTZ, who is being seen today for the evaluation of HFrEF at the request of Dr Tyrell Antonio.  Assessment & Plan     Heart Failure Reduced Ejection Fraction  HTN - NYHA class III, Stage C, hypervolemic, etiology unclear - Diuretic regimen: Lasix 80 mg IV BID change made 12/18/20 for PM dose (ordered) - Strict I/Os, daily weights, and fluid restriction of < 2 L; monitor K and Mg - Trial of Coreg 3.25 mg PO BID as next medication change planned (will first increase lasix) - losartan 100 mg- possible switch to [employer] as outpatient - aldactone 25 mg PO Daily - hydralazine 10 mg TID - SGLT2i consideration as  outpatient -Ferritin and transferrin sent for AM (ordered) - SPEP ordered for AM (ordered) - TSH 3.43 - weaned down to 1 L  Non-obstrucitve CAD HLD - ASA 81 - Imdur 30 mg - Pravastatin 40mg  for now; LDL goal < 70  Discussed with patient and primary MD  For questions or updates, please contact [facility] Please consult [email or web address] for contact info under Cardiology/STEMI.      Signed, Werner Lean, MD  12/18/2020, 9:00 AM

## 2020-12-18 NOTE — Progress Notes (Signed)
Occupational Therapy Treatment Patient Details Name: [name] MRN: [id number] DOB: May 18, 1934 Today's Date: 12/18/2020    History of present illness Pt is an 87 y/o female admitted secondary to SOB and BLE swelling. Thought to be secondary to acute CHF. PMH includes HTN and COPD.   OT comments  Pt making steady progress towards OT goals this session. Pt received seated in recliner wanting to return to bed. Pt presents with decreased activity tolerance, generalized deconditioning and mild cognitive impairments impacting pts overall safety awareness. Pt currently requires min guard assist for functional mobility ~ 34 ft with RW. Pt requires gross supervision for standing ADL at sink ( most assist needed for safety awareness) and MIN A for UB ADLs. Pt on RA during session with pt desaturating to lows 80s with mobility tasks however sats rebound quickly to greater than 90% with rest break. Pt would continue to benefit from skilled occupational therapy while admitted and after d/c to address the below listed limitations in order to improve overall functional mobility and facilitate independence with BADL participation. DC plan currently remains appropriate, and pt does report that her daughter could assist her more at home if needed, although pt does report she would willing to [facility], will follow acutely per POC and update DC recs as needed.     Follow Up Recommendations  Home health OT;Supervision/Assistance - 24 hour;Other (comment) (initially; watch for need for SNF)    Equipment Recommendations  3 in 1 bedside commode    Recommendations for Other Services      Precautions / Restrictions Precautions Precautions: Fall Precaution Comments: watch SpO2 Restrictions Weight Bearing Restrictions: No       Mobility Bed Mobility Overal bed mobility: Needs Assistance Bed Mobility: Sit to Supine       Sit to supine: Min guard;HOB elevated   General bed mobility  comments: min guard for line mgmt    Transfers Overall transfer level: Needs assistance Equipment used: Rolling walker (2 wheeled) Transfers: Sit to/from Stand Sit to Stand: Min guard         General transfer comment: Min guard for safety.    Balance Overall balance assessment: Needs assistance Sitting-balance support: Feet supported;No upper extremity supported Sitting balance-Leahy Scale: Good     Standing balance support: No upper extremity supported;During functional activity Standing balance-Leahy Scale: Fair Standing balance comment: able to complete dynamic ADLs at sink with no UE support and no LOB noted                           ADL either performed or assessed with clinical judgement   ADL Overall ADL's : Needs assistance/impaired     Grooming: Oral care;Standing;Supervision/safety;Cueing for safety;Brushing hair Grooming Details (indicate cue type and reason): gross supervision for standing grooming at sink, cues for safety awareness as pt noted to keep sink running after oral care         Upper Body Dressing : Minimal assistance;Sitting Upper Body Dressing Details (indicate cue type and reason): to don gown as back side cover     Toilet Transfer: Min guard;Ambulation;RW Toilet Transfer Details (indicate cue type and reason): simulated via functional mobility with RW and min guard for safety with Rw         Functional mobility during ADLs: Min guard;Rolling walker;Cueing for safety General ADL Comments: pt continues to present with decreased activity tolerance, generalized weakness and impaired safety awareness  Vision       Perception     Praxis      Cognition Arousal/Alertness: Awake/alert Behavior During Therapy: WFL for tasks assessed/performed Overall Cognitive Status: Impaired/Different from baseline Area of Impairment: Safety/judgement;Memory                     Memory: Decreased short-term memory    Safety/Judgement: Decreased awareness of safety     General Comments: pt attempting to make phone calls with call bell and cannot recall local area code, pt required cues to turn off sink after completing oral care        Exercises     Shoulder Instructions       General Comments pt on RA during session with pt desaturating to lows 80s with mobility tasks however sats rebound quickly to greater than 90% with rest break. pt reports she lives alone but her dtr lives next to her, she does endorse that her dtr can assist her more at home if she needed, although apparently daughter is an Therapist, sports here so unsure of validity of this information. pt reports her dtr assists with IADLs at baseline, pt is agreeable to [facility] SNF placement if she were to need it    Pertinent Vitals/ Pain       Pain Assessment: Faces Faces Pain Scale: Hurts a little bit Pain Location: L ankle Pain Descriptors / Indicators: Sore (Pt reports " I have old ankles") Pain Intervention(s): Monitored during session  Home Living                                          Prior Functioning/Environment              Frequency  Min 2X/week        Progress Toward Goals  OT Goals(current goals can now be found in the care plan section)  Progress towards OT goals: Progressing toward goals  Acute Rehab OT Goals Patient Stated Goal: to go home OT Goal Formulation: With patient Time For Goal Achievement: 12/31/20 Potential to Achieve Goals: Good  Plan Discharge plan remains appropriate;Frequency remains appropriate    Co-evaluation                 AM-PAC OT "6 Clicks" Daily Activity     Outcome Measure   Help from another person eating meals?: A Little (s/u) Help from another person taking care of personal grooming?: A Little (safety) Help from another person toileting, which includes using toliet, bedpan, or urinal?: A Little Help from another person bathing (including washing, rinsing,  drying)?: A Lot Help from another person to put on and taking off regular upper body clothing?: A Little Help from another person to put on and taking off regular lower body clothing?: A Lot 6 Click Score: 16    End of Session Equipment Utilized During Treatment: Gait belt;Rolling walker  OT Visit Diagnosis: Other abnormalities of gait and mobility (R26.89);Muscle weakness (generalized) (M62.81)   Activity Tolerance Patient tolerated treatment well   Patient Left in bed;with call bell/phone within reach;with bed alarm set   Nurse Communication Mobility status        Time: 9798-9211 OT Time Calculation (min): 22 min  Charges: OT General Charges $OT Visit: 1 Visit OT Treatments $Self Care/Home Management : 8-22 mins  Harley Alto., COTA/L Acute Rehabilitation Services [phone]    [facility]  Jabier Gauss 12/18/2020, 1:43 PM

## 2020-12-18 NOTE — Progress Notes (Signed)
Pt's denies Chest pain and SOB throughout the day, in RA, Sao2 @94 , sat in a recliner couple of hours in AM and walked in a hallway with PT, no any specific complain, will continue to monitor  Palma Holter, RN

## 2020-12-18 NOTE — Progress Notes (Signed)
PROGRESS NOTE    [name]  MWN:[id number] DOB: [birth year] DOA: 12/15/2020 PCP: Hulan Fess, MD   Brief Narrative: 87 year old with past medical history significant for hypertension, hyponatremia secondary to hydrochlorothiazide, COPD, anxiety depression who presented with new onset of shortness of breath.  Patient reports her symptoms started 4 to 5 days prior to admission, she initially started with exertional dyspnea, since the day of admission her shortness of breath is constant.  Presents with significantly elevated blood pressure, fluid overload, chest x-ray with bilateral pleural effusion, potassium 2.7. Patient was found to have new reduce Ef 25 %  Assessment & Plan:   Principal Problem:   Acute combined systolic and diastolic CHF, NYHA class 4 (HCC) Active Problems:   Hypertension   Cardiomyopathy ([place]), type unknown   COPD suggested by initial evaluation ([place])  1-Acute systolic and Diastolic Heart Failure Exacerbation: -Patient presents with Dyspnea. Chest x-ray: With increase opacity could reflect pulmonary edema. BNP; 3235. -Cardiology recommend to increase lasix to 80 mg IV BID> today.  -Weight: 118---115 -Negative 5.9   L.  -ECHO: Ef 25 %, diastolic dysfunction grade one, Mitral Valve regurgitation.  -Cardiology consulted, recommend ischemic evaluation during this admission.  Cath; No significant CAD>  Started on Cozaar, Imdur, spironolactone.   Hypokalemia: Started on spironolactone.  Will give one time dose of potassium.   Hyperetension: Continue with Cozaar and lasix.  On low dose hydralazine, Imdur.   COPD: Schedule nebulizer.   HLD: Continue with Pravachol.   Elevated Blood sugar: pre diabetes range. Diet modification.  HbA1c; 5.8  Memory problems; worse over last 6 months.  -B 12 low normal. Will start supplement.   Vitamin D deficiency; started supplement.   -needs out patient referral to neurology for dementia evaluation.   Estimated  body mass index is 21.79 kg/m as calculated from the following:   Height as of this encounter: 5\' 1"  (1.549 m).   Weight as of this encounter: 52.3 kg.   DVT prophylaxis: Lovenox Code Status: Full code Family Communication: care discussed with patient. Daughter updated 2/25 Disposition Plan:  Status is: Inpatient  Remains inpatient appropriate because:IV treatments appropriate due to intensity of illness or inability to take PO   Dispo: The patient is from: Home              Anticipated d/c is to: to be determine              Anticipated d/c date is: 2 days              Patient currently is not medically stable to d/c.   Difficult to place patient No        Consultants:   None  Procedures:   ECHO: Left Ventricle: Left ventricular ejection fraction, by estimation, is 25  to 30%. Left ventricular ejection fraction by 3D volume is 31 %. The left  ventricle has severely decreased function. The left ventricle demonstrates  global hypokinesis. The left  ventricular internal cavity size was normal in size. There is mild left  ventricular hypertrophy. Left ventricular diastolic parameters are  consistent with Grade I diastolic dysfunction (impaired relaxation).   Antimicrobials:    Subjective: She is not breathing better. She report feeling weak and tired.   Objective: Vitals:   12/17/20 2300 12/18/20 0000 12/18/20 0300 12/18/20 0500  BP: 108/63 137/78 132/79   Pulse: 68  65   Resp: 20 18 20    Temp:   97.6 F (36.4 C)   TempSrc:  Oral   SpO2: 98% 93% 94%   Weight:    52.3 kg  Height:        Intake/Output Summary (Last 24 hours) at 12/18/2020 0718 Last data filed at 12/18/2020 0300 Gross per 24 hour  Intake 120 ml  Output 2050 ml  Net -1930 ml   Filed Weights   12/17/20 0407 12/18/20 0500  Weight: 53.6 kg 52.3 kg    Examination:  General exam: NAD Respiratory system: BL crackles.  Cardiovascular system: S 1, S 2 RRR Gastrointestinal system: BS  present, soft, nt Central nervous system: Alert Extremities: plus 1 edema   Data Reviewed: I have personally reviewed following labs and imaging studies  CBC: Recent Labs  Lab 12/15/20 1514 12/17/20 1452 12/17/20 1455 12/17/20 1820  WBC 9.7  --   --  10.1  HGB 15.0 14.6 14.3 14.3  HCT 48.0* 43.0 42.0 43.9  MCV 98.4  --   --  95.6  PLT 171  --   --  166   Basic Metabolic Panel: Recent Labs  Lab 12/15/20 1514 12/16/20 0423 12/17/20 0009 12/17/20 1452 12/17/20 1455 12/17/20 1820 12/18/20 0055  NA 146* 144 144 145 145  --  143  K 2.7* 3.5 3.7 4.2 4.1  --  3.9  CL 104 104 104  --   --   --  102  CO2 30 28 30   --   --   --  31  GLUCOSE 147* 122* 101*  --   --   --  89  BUN 10 12 14   --   --   --  19  CREATININE 0.91 0.88 0.87  --   --  0.84 0.81  CALCIUM 9.2 9.0 8.6*  --   --   --  8.9  MG 2.0  --   --   --   --   --   --    GFR: Estimated Creatinine Clearance: 39 mL/min (by C-G formula based on SCr of 0.81 mg/dL). Liver Function Tests: No results for input(s): AST, ALT, ALKPHOS, BILITOT, PROT, ALBUMIN in the last 168 hours. No results for input(s): LIPASE, AMYLASE in the last 168 hours. No results for input(s): AMMONIA in the last 168 hours. Coagulation Profile: No results for input(s): INR, PROTIME in the last 168 hours. Cardiac Enzymes: No results for input(s): CKTOTAL, CKMB, CKMBINDEX, TROPONINI in the last 168 hours. BNP (last 3 results) No results for input(s): PROBNP in the last 8760 hours. HbA1C: Recent Labs    12/16/20 0423  HGBA1C 5.8*   CBG: No results for input(s): GLUCAP in the last 168 hours. Lipid Profile: No results for input(s): CHOL, HDL, LDLCALC, TRIG, CHOLHDL, LDLDIRECT in the last 72 hours. Thyroid Function Tests: Recent Labs    12/15/20 2010  TSH 3.422   Anemia Panel: Recent Labs    12/18/20 0055  VITAMINB12 223   Sepsis Labs: No results for input(s): PROCALCITON, LATICACIDVEN in the last 168 hours.  Recent Results (from the  past 240 hour(s))  Resp Panel by RT-PCR (Flu A&B, Covid) Nasopharyngeal Swab     Status: None   Collection Time: 12/15/20  6:13 PM   Specimen: Nasopharyngeal Swab; Nasopharyngeal(NP) swabs in vial transport medium  Result Value Ref Range Status   SARS Coronavirus 2 by RT PCR NEGATIVE NEGATIVE Final    Comment: (NOTE) SARS-CoV-2 target nucleic acids are NOT DETECTED.  The SARS-CoV-2 RNA is generally detectable in upper respiratory specimens during the acute phase of infection. The  lowest concentration of SARS-CoV-2 viral copies this assay can detect is 138 copies/mL. A negative result does not preclude SARS-Cov-2 infection and should not be used as the sole basis for treatment or other patient management decisions. A negative result may occur with  improper specimen collection/handling, submission of specimen other than nasopharyngeal swab, presence of viral mutation(s) within the areas targeted by this assay, and inadequate number of viral copies(<138 copies/mL). A negative result must be combined with clinical observations, patient history, and epidemiological information. The expected result is Negative.  Fact Sheet for Patients:  EntrepreneurPulse.com.au  Fact Sheet for Healthcare Providers:  IncredibleEmployment.be  This test is no t yet approved or cleared by the [place] FDA and  has been authorized for detection and/or diagnosis of SARS-CoV-2 by FDA under an Emergency Use Authorization (EUA). This EUA will remain  in effect (meaning this test can be used) for the duration of the COVID-19 declaration under Section 564(b)(1) of the Act, 21 U.S.C.section 360bbb-3(b)(1), unless the authorization is terminated  or revoked sooner.       Influenza A by PCR NEGATIVE NEGATIVE Final   Influenza B by PCR NEGATIVE NEGATIVE Final    Comment: (NOTE) The Xpert Xpress SARS-CoV-2/FLU/RSV plus assay is intended as an aid in the diagnosis of  influenza from Nasopharyngeal swab specimens and should not be used as a sole basis for treatment. Nasal washings and aspirates are unacceptable for Xpert Xpress SARS-CoV-2/FLU/RSV testing.  Fact Sheet for Patients: EntrepreneurPulse.com.au  Fact Sheet for Healthcare Providers: IncredibleEmployment.be  This test is not yet approved or cleared by the [place] FDA and has been authorized for detection and/or diagnosis of SARS-CoV-2 by FDA under an Emergency Use Authorization (EUA). This EUA will remain in effect (meaning this test can be used) for the duration of the COVID-19 declaration under Section 564(b)(1) of the Act, 21 U.S.C. section 360bbb-3(b)(1), unless the authorization is terminated or revoked.  Performed at [facility], [place] [address] [place], [place] [address]   MRSA PCR Screening     Status: None   Collection Time: 12/16/20  3:20 PM   Specimen: Nasal Mucosa; Nasopharyngeal  Result Value Ref Range Status   MRSA by PCR NEGATIVE NEGATIVE Final    Comment:        The GeneXpert MRSA Assay (FDA approved for NASAL specimens only), is one component of a comprehensive MRSA colonization surveillance program. It is not intended to diagnose MRSA infection nor to guide or monitor treatment for MRSA infections. Performed at [facility], [place] [address] [place], [place] [address]          Radiology Studies: CARDIAC CATHETERIZATION  Result Date: 12/17/2020  Prox LAD to Mid LAD lesion is 20% stenosed.  Prox RCA lesion is 25% stenosed.  LV end diastolic pressure is mildly elevated.  Hemodynamic findings consistent with mild pulmonary hypertension.  1. Minimal nonobstructive CAD 2. Mildly elevated LVEDP 3. Mild pulmonary HTN. Mean PAP 22 mm Hg 4. Reduced cardiac output 2.6 L/min with index 1.72 Plan: medical management.   ECHOCARDIOGRAM COMPLETE  Result Date: 12/16/2020    ECHOCARDIOGRAM REPORT   Patient Name:    KIP KAUTZMAN Date of Exam: 12/16/2020 Medical Rec #:  [id number]      Height:       59.0 in Accession #:    [id number]     Weight:       132.2 lb Date of Birth:  1934-03-27      BSA:  1.547 m Patient Age:    67 years       BP:           153/102 mmHg Patient Gender: F              HR:           79 bpm. Exam Location:  Inpatient Procedure: 2D Echo, Color Doppler and Cardiac Doppler Indications:    CHF-Acute Diastolic  History:        Patient has prior history of Echocardiogram examinations. COPD,                 Signs/Symptoms:Shortness of Breath; Risk Factors:Hypertension.  Sonographer:    Clayton Lefort RDCS (AE) Referring Phys: [id number] [place]  1. Left ventricular ejection fraction, by estimation, is 25 to 30%. Left ventricular ejection fraction by 3D volume is 31 %. The left ventricle has severely decreased function. The left ventricle demonstrates global hypokinesis. There is mild left ventricular hypertrophy. Left ventricular diastolic parameters are consistent with Grade I diastolic dysfunction (impaired relaxation).  2. Right ventricular systolic function is normal. The right ventricular size is normal. There is mildly elevated pulmonary artery systolic pressure.  3. Left atrial size was severely dilated.  4. The mitral valve is degenerative. Moderate mitral valve regurgitation. No evidence of mitral stenosis. Moderate mitral annular calcification.  5. The aortic valve is calcified. Aortic valve regurgitation is trivial. Mild aortic valve sclerosis is present, with no evidence of aortic valve stenosis.  6. Aortic dilatation noted. There is mild dilatation of the ascending aorta, measuring 39 mm.  7. The inferior vena cava is normal in size with greater than 50% respiratory variability, suggesting right atrial pressure of 3 mmHg. FINDINGS  Left Ventricle: Left ventricular ejection fraction, by estimation, is 25 to 30%. Left ventricular ejection fraction by 3D volume is 31 %. The left  ventricle has severely decreased function. The left ventricle demonstrates global hypokinesis. The left ventricular internal cavity size was normal in size. There is mild left ventricular hypertrophy. Left ventricular diastolic parameters are consistent with Grade I diastolic dysfunction (impaired relaxation). Right Ventricle: The right ventricular size is normal. No increase in right ventricular wall thickness. Right ventricular systolic function is normal. There is mildly elevated pulmonary artery systolic pressure. The tricuspid regurgitant velocity is 2.66  m/s, and with an assumed right atrial pressure of 8 mmHg, the estimated right ventricular systolic pressure is 62.7 mmHg. Left Atrium: Left atrial size was severely dilated. Right Atrium: Right atrial size was normal in size. Pericardium: There is no evidence of pericardial effusion. Mitral Valve: The mitral valve is degenerative in appearance. There is moderate thickening of the mitral valve leaflet(s). There is moderate calcification of the mitral valve leaflet(s). Moderate mitral annular calcification. Moderate mitral valve regurgitation. No evidence of mitral valve stenosis. MV peak gradient, 4.2 mmHg. The mean mitral valve gradient is 1.0 mmHg. Tricuspid Valve: The tricuspid valve is normal in structure. Tricuspid valve regurgitation is trivial. No evidence of tricuspid stenosis. Aortic Valve: The aortic valve is calcified. Aortic valve regurgitation is trivial. Aortic regurgitation PHT measures 465 msec. Mild aortic valve sclerosis is present, with no evidence of aortic valve stenosis. Aortic valve mean gradient measures 4.0 mmHg. Aortic valve peak gradient measures 7.7 mmHg. Aortic valve area, by VTI measures 1.93 cm. Pulmonic Valve: The pulmonic valve was normal in structure. Pulmonic valve regurgitation is not visualized. No evidence of pulmonic stenosis. Aorta: The aortic root is normal in size and  structure and aortic dilatation noted. There is  mild dilatation of the ascending aorta, measuring 39 mm. Venous: The inferior vena cava is normal in size with greater than 50% respiratory variability, suggesting right atrial pressure of 3 mmHg. IAS/Shunts: No atrial level shunt detected by color flow Doppler.  LEFT VENTRICLE PLAX 2D LVIDd:         5.20 cm         Diastology LVIDs:         4.80 cm         LV e' medial:    4.24 cm/s LV PW:         1.60 cm         LV E/e' medial:  17.7 LV IVS:        1.30 cm         LV e' lateral:   4.79 cm/s LVOT diam:     2.20 cm         LV E/e' lateral: 15.7 LV SV:         44 LV SV Index:   28 LVOT Area:     3.80 cm        3D Volume EF                                LV 3D EF:    Left                                             ventricular                                             ejection                                             fraction by                                             3D volume                                             is 31 %.                                 3D Volume EF:                                3D EF:        31 %                                LV EDV:       183 ml  LV ESV:       126 ml                                LV SV:        57 ml RIGHT VENTRICLE            IVC RV Basal diam:  3.30 cm    IVC diam: 1.30 cm RV S prime:     9.57 cm/s TAPSE (M-mode): 2.3 cm LEFT ATRIUM             Index       RIGHT ATRIUM           Index LA diam:        3.10 cm 2.00 cm/m  RA Area:     14.30 cm LA Vol (A2C):   90.0 ml 58.18 ml/m RA Volume:   30.70 ml  19.85 ml/m LA Vol (A4C):   55.4 ml 35.82 ml/m LA Biplane Vol: 71.1 ml 45.97 ml/m  AORTIC VALVE AV Area (Vmax):    1.94 cm AV Area (Vmean):   2.03 cm AV Area (VTI):     1.93 cm AV Vmax:           139.00 cm/s AV Vmean:          93.200 cm/s AV VTI:            0.226 m AV Peak Grad:      7.7 mmHg AV Mean Grad:      4.0 mmHg LVOT Vmax:         71.00 cm/s LVOT Vmean:        49.800 cm/s LVOT VTI:          0.115 m LVOT/AV VTI ratio: 0.51  AI PHT:            465 msec  AORTA Ao Root diam: 3.10 cm Ao Asc diam:  3.90 cm MITRAL VALVE                 TRICUSPID VALVE MV Area (PHT): 3.91 cm      TR Peak grad:   28.3 mmHg MV Area VTI:   1.62 cm      TR Vmax:        266.00 cm/s MV Peak grad:  4.2 mmHg MV Mean grad:  1.0 mmHg      SHUNTS MV Vmax:       1.02 m/s      Systemic VTI:  0.12 m MV Vmean:      49.3 cm/s     Systemic Diam: 2.20 cm MV Decel Time: 194 msec MR Peak grad:    119.7 mmHg MR Mean grad:    65.0 mmHg MR Vmax:         547.00 cm/s MR Vmean:        369.0 cm/s MR PISA:         3.08 cm MR PISA Eff ROA: 22 mm MR PISA Radius:  0.70 cm MV E velocity: 75.00 cm/s MV A velocity: 81.80 cm/s MV E/A ratio:  0.92 Candee Furbish MD Electronically signed by Candee Furbish MD Signature Date/Time: 12/16/2020/2:19:56 PM    Final         Scheduled Meds: . aspirin EC  81 mg Oral QHS  . enoxaparin (LOVENOX) injection  40 mg Subcutaneous Q24H  . furosemide  40 mg Intravenous Q12H  . guaiFENesin  600 mg Oral BID  .  hydrALAZINE  10 mg Oral Q8H  . isosorbide mononitrate  30 mg Oral Daily  . losartan  100 mg Oral Daily  . pravastatin  40 mg Oral q1800  . sodium chloride flush  3 mL Intravenous Q12H  . sodium chloride flush  3 mL Intravenous Q12H  . sodium chloride flush  3 mL Intravenous Q12H  . spironolactone  25 mg Oral Daily  . venlafaxine XR  75 mg Oral QPC breakfast  . vitamin B-12  1,000 mcg Oral Once  . [START ON 12/19/2020] vitamin B-12  100 mcg Oral Daily  . Vitamin D (Ergocalciferol)  50,000 Units Oral Q7 days   Continuous Infusions: . sodium chloride    . sodium chloride       LOS: 3 days    Time spent: 35 minutes.     Elmarie Shiley, MD [facility]   If 7PM-7AM, please contact night-coverage [email or web address]  12/18/2020, 7:18 AM

## 2020-12-19 DIAGNOSIS — I5041 Acute combined systolic (congestive) and diastolic (congestive) heart failure: Secondary | ICD-10-CM | POA: Diagnosis not present

## 2020-12-19 LAB — BASIC METABOLIC PANEL
Anion gap: 10 (ref 5–15)
BUN: 24 mg/dL — ABNORMAL HIGH (ref 8–23)
CO2: 30 mmol/L (ref 22–32)
Calcium: 9.5 mg/dL (ref 8.9–10.3)
Chloride: 98 mmol/L (ref 98–111)
Creatinine, Ser: 0.88 mg/dL (ref 0.44–1.00)
GFR, Estimated: >60 mL/min (ref >60)
Glucose, Bld: 97 mg/dL (ref 70–99)
Potassium: 3.7 mmol/L (ref 3.5–5.1)
Sodium: 138 mmol/L (ref 135–145)

## 2020-12-19 LAB — FERRITIN: Ferritin: 57 ng/mL (ref 11–307)

## 2020-12-19 LAB — TRANSFERRIN: Transferrin: 249 mg/dL (ref 192–382)

## 2020-12-19 LAB — CORTISOL-AM, BLOOD: Cortisol - AM: 10.3 ug/dL (ref 6.7–22.6)

## 2020-12-19 MED ORDER — BENZONATATE 100 MG PO CAPS: 100.0000 mg | ORAL_CAPSULE | Freq: Two times a day (BID) | ORAL | Status: DC | PRN

## 2020-12-19 MED ORDER — ACETAMINOPHEN 500 MG PO TABS
500.0000 mg | ORAL_TABLET | Freq: Three times a day (TID) | ORAL | Status: DC
  Administered 2020-12-19 (×3): 500 mg via ORAL
  Filled 2020-12-19 (×3): qty 1

## 2020-12-19 MED ORDER — DICLOFENAC SODIUM 1 % EX GEL
2.0000 g | Freq: Four times a day (QID) | CUTANEOUS | Status: DC
  Administered 2020-12-19 – 2020-12-27 (×27): 2 g via TOPICAL
  Filled 2020-12-19: qty 100

## 2020-12-19 MED ORDER — FUROSEMIDE 10 MG/ML IJ SOLN
40.0000 mg | Freq: Two times a day (BID) | INTRAMUSCULAR | Status: DC
  Administered 2020-12-19 – 2020-12-20 (×2): 40 mg via INTRAVENOUS
  Filled 2020-12-19 (×2): qty 4

## 2020-12-19 MED ORDER — GUAIFENESIN ER 600 MG PO TB12
1200.0000 mg | ORAL_TABLET | Freq: Two times a day (BID) | ORAL | Status: DC
  Administered 2020-12-19 – 2020-12-27 (×16): 1200 mg via ORAL
  Filled 2020-12-19 (×16): qty 2

## 2020-12-19 MED ORDER — IPRATROPIUM-ALBUTEROL 0.5-2.5 (3) MG/3ML IN SOLN
3.0000 mL | Freq: Four times a day (QID) | RESPIRATORY_TRACT | Status: DC
  Administered 2020-12-19 (×2): 3 mL via RESPIRATORY_TRACT
  Filled 2020-12-19 (×2): qty 3

## 2020-12-19 MED ORDER — GUAIFENESIN ER 600 MG PO TB12
600.0000 mg | ORAL_TABLET | Freq: Once | ORAL | Status: AC
  Administered 2020-12-19: 600 mg via ORAL
  Filled 2020-12-19: qty 1

## 2020-12-19 NOTE — Progress Notes (Signed)
Progress Note  Patient Name: [name] Date of Encounter: 12/19/2020  Primary Cardiologist: Gardiner Rhyme- new  Subjective   Walked with PT without issues.  Does not remember encounter from 12/18/20.  Notes that she still feels rotten.  Inpatient Medications    Scheduled Meds: . aspirin EC  81 mg Oral QHS  . enoxaparin (LOVENOX) injection  40 mg Subcutaneous Q24H  . furosemide  40 mg Intravenous Q12H  . guaiFENesin  600 mg Oral BID  . hydrALAZINE  10 mg Oral Q8H  . isosorbide mononitrate  30 mg Oral Daily  . losartan  100 mg Oral Daily  . pravastatin  40 mg Oral q1800  . sodium chloride flush  3 mL Intravenous Q12H  . sodium chloride flush  3 mL Intravenous Q12H  . sodium chloride flush  3 mL Intravenous Q12H  . spironolactone  25 mg Oral Daily  . venlafaxine XR  75 mg Oral QPC breakfast  . vitamin B-12  100 mcg Oral Daily  . Vitamin D (Ergocalciferol)  50,000 Units Oral Q7 days   Continuous Infusions: . sodium chloride    . sodium chloride     PRN Meds: sodium chloride, sodium chloride, acetaminophen, ALPRAZolam, docusate sodium, hydrALAZINE, ipratropium-albuterol, ondansetron (ZOFRAN) IV, sodium chloride flush, sodium chloride flush   Vital Signs    Vitals:   12/18/20 2300 12/19/20 0300 12/19/20 0500 12/19/20 0720  BP: 106/70 135/80  113/76  Pulse: 60 65  80  Resp: 15 20  16   Temp: 97.9 F (36.6 C) 97.7 F (36.5 C)  97.6 F (36.4 C)  TempSrc: Oral Oral  Oral  SpO2: 96% 97%  96%  Weight:   49.2 kg   Height:        Intake/Output Summary (Last 24 hours) at 12/19/2020 0904 Last data filed at 12/19/2020 0800 Gross per 24 hour  Intake 720 ml  Output 2650 ml  Net -1930 ml   Filed Weights   12/17/20 0407 12/18/20 0500 12/19/20 0500  Weight: 53.6 kg 52.3 kg 49.2 kg    Telemetry    SR - Personally Reviewed  ECG    No new - Personally Reviewed  Physical Exam   GEN: No acute distress.   Neck: No JVD at 30 degrees Cardiac: RRR, no murmurs, rubs, or  gallops.  Respiratory: Cleat to auscultation bilaterally, poor air movment GI: Soft, nontender, non-distended  MS: No edema; No deformity. Neuro:  Nonfocal  Psych: Normal affect  See on RA  Labs    Chemistry Recent Labs  Lab 12/17/20 0009 12/17/20 1452 12/17/20 1455 12/17/20 1820 12/18/20 0055 12/19/20 0057  NA 144   < > 145  --  143 138  K 3.7   < > 4.1  --  3.9 3.7  CL 104  --   --   --  102 98  CO2 30  --   --   --  31 30  GLUCOSE 101*  --   --   --  89 97  BUN 14  --   --   --  19 24*  CREATININE 0.87  --   --  0.84 0.81 0.88  CALCIUM 8.6*  --   --   --  8.9 9.5  GFRNONAA >60  --   --  >60 >60 >60  ANIONGAP 10  --   --   --  10 10   < > = values in this interval not displayed.     Hematology Recent  Labs  Lab 12/15/20 1514 12/17/20 1452 12/17/20 1455 12/17/20 1820  WBC 9.7  --   --  10.1  RBC 4.88  --   --  4.59  HGB 15.0 14.6 14.3 14.3  HCT 48.0* 43.0 42.0 43.9  MCV 98.4  --   --  95.6  MCH 30.7  --   --  31.2  MCHC 31.3  --   --  32.6  RDW 15.8*  --   --  16.0*  PLT 171  --   --  193    Cardiac EnzymesNo results for input(s): TROPONINI in the last 168 hours. No results for input(s): TROPIPOC in the last 168 hours.   BNP Recent Labs  Lab 12/15/20 1514  BNP 3,235.0*     DDimer No results for input(s): DDIMER in the last 168 hours.   Radiology    CARDIAC CATHETERIZATION  Result Date: 12/17/2020  Prox LAD to Mid LAD lesion is 20% stenosed.  Prox RCA lesion is 25% stenosed.  LV end diastolic pressure is mildly elevated.  Hemodynamic findings consistent with mild pulmonary hypertension.  1. Minimal nonobstructive CAD 2. Mildly elevated LVEDP 3. Mild pulmonary HTN. Mean PAP 22 mm Hg 4. Reduced cardiac output 2.6 L/min with index 1.72 Plan: medical management.    Cardiac Studies    Prox LAD to Mid LAD lesion is 20% stenosed.  Prox RCA lesion is 25% stenosed.  LV end diastolic pressure is mildly elevated.  Hemodynamic findings consistent  with mild pulmonary hypertension.   1. Minimal nonobstructive CAD 2. Mildly elevated LVEDP 3. Mild pulmonary HTN. Mean PAP 22 mm Hg 4. Reduced cardiac output 2.6 L/min with index 1.72  1. Left ventricular ejection fraction, by estimation, is 25 to 30%. Left  ventricular ejection fraction by 3D volume is 31 %. The left ventricle has  severely decreased function. The left ventricle demonstrates global  hypokinesis. There is mild left  ventricular hypertrophy. Left ventricular diastolic parameters are  consistent with Grade I diastolic dysfunction (impaired relaxation).  2. Right ventricular systolic function is normal. The right ventricular  size is normal. There is mildly elevated pulmonary artery systolic  pressure.  3. Left atrial size was severely dilated.  4. The mitral valve is degenerative. Moderate mitral valve regurgitation.  No evidence of mitral stenosis. Moderate mitral annular calcification.  5. The aortic valve is calcified. Aortic valve regurgitation is trivial.  Mild aortic valve sclerosis is present, with no evidence of aortic valve  stenosis.  6. Aortic dilatation noted. There is mild dilatation of the ascending  aorta, measuring 39 mm.  7. The inferior vena cava is normal in size with greater than 50%  respiratory variability, suggesting right atrial pressure of 3 mmHg.    Patient Profile     87 y.o. female with a hx of HTN, HLD, COPD, squamous cell skin CA (perianal), anxiety/depression, low Na+ 2nd HCTZ, who is being seen today for the evaluation of HFrEF at the request of Dr Tyrell Antonio.  Assessment & Plan     Heart Failure Reduced Ejection Fraction  HTN COPD- O2 sat goal 88-92 % - NYHA class III, Stage C, hypervolemic, etiology unclear - Diuretic regimen: Lasix 40 mg IV BID change made 12/18/20 for PM dose (ordered)- potential transition to PO 12/20/20 - Strict I/Os, daily weights, and fluid restriction of < 2 L; monitor K and Mg - Trial of Coreg 3.25 mg  PO BID as next medication possibly (low BP and HRs this AM) - losartan  100 mg- possible switch to [facility] as outpatient - aldactone 25 mg PO Daily - hydralazine 10 mg TID - SGLT2i consideration as outpatient - low normal ferritin - SPEP ordered for AM 12/19/20- pending - TSH 3.43  Non-obstrucitve CAD HLD - ASA 81 - Imdur 30 mg - Pravastatin 40mg  for now; LDL goal < 70  Mild Thoracic Aortic Dilation 39 mm; ASHI 2.51cm/m (moderate) - no evidence of dissection - outpatient can discuss the risk and benefits of aggressive monitoring  Discussed with nursing team and patient  For questions or updates, please contact [facility] Please consult [email or web address] for contact info under Cardiology/STEMI.      Signed, Werner Lean, MD  12/19/2020, 9:04 AM

## 2020-12-19 NOTE — Progress Notes (Addendum)
PROGRESS NOTE    [name]  LGX:[id number] DOB: 15-Feb-1934 DOA: 12/15/2020 PCP: Hulan Fess, MD   Brief Narrative: 87 year old with past medical history significant for hypertension, hyponatremia secondary to hydrochlorothiazide, COPD, anxiety depression who presented with new onset of shortness of breath.  Patient reports her symptoms started 4 to 5 days prior to admission, she initially started with exertional dyspnea, since the day of admission her shortness of breath is constant.  Presents with significantly elevated blood pressure, fluid overload, chest x-ray with bilateral pleural effusion, potassium 2.7. Patient was found to have new reduce Ef 25 %  Assessment & Plan:   Principal Problem:   Acute combined systolic and diastolic CHF, NYHA class 4 (HCC) Active Problems:   Hypertension   Cardiomyopathy ([place]), type unknown   COPD suggested by initial evaluation ([place])  1-Acute systolic and Diastolic Heart Failure Exacerbation: -Patient presents with Dyspnea. Chest x-ray: With increase opacity could reflect pulmonary edema. BNP; 3235. -Lasix decrease to 40 Mg IV BID, possible transition to oral lasix tomorrow.  -Weight: 118---115---108 -Negative 6  L.  -ECHO: Ef 25 %, diastolic dysfunction grade one, Mitral Valve regurgitation.  -Cardiology consulted, recommend ischemic evaluation during this admission.  -Cath; No significant CAD>  -Started on Cozaar, Imdur, spironolactone.   Hypokalemia: Started on spironolactone.  Resolved.   Hyperetension: Continue with Cozaar and lasix.  On low dose hydralazine, Imdur.   COPD: Schedule nebulizer.  Increase Mucinex dose.  Flutter valve.   HLD: Continue with Pravachol.   Elevated Blood sugar: pre diabetes range. Diet modification.  HbA1c; 5.8  Memory problems; worse over last 6 months.  -B 12 low normal. started supplement.   Vitamin D deficiency; started supplement.   -needs out patient referral to neurology for  dementia evaluation.   Estimated body mass index is 20.49 kg/m as calculated from the following:   Height as of this encounter: 5\' 1"  (1.549 m).   Weight as of this encounter: 49.2 kg.   DVT prophylaxis: Lovenox Code Status: Full code Family Communication: care discussed with patient. Daughter updated 2/26 Disposition Plan:  Status is: Inpatient  Remains inpatient appropriate because:IV treatments appropriate due to intensity of illness or inability to take PO   Dispo: The patient is from: Home              Anticipated d/c is to: to be determine              Anticipated d/c date is: 2 days              Patient currently is not medically stable to d/c.   Difficult to place patient No        Consultants:   None  Procedures:   ECHO: Left Ventricle: Left ventricular ejection fraction, by estimation, is 25  to 30%. Left ventricular ejection fraction by 3D volume is 31 %. The left  ventricle has severely decreased function. The left ventricle demonstrates  global hypokinesis. The left  ventricular internal cavity size was normal in size. There is mild left  ventricular hypertrophy. Left ventricular diastolic parameters are  consistent with Grade I diastolic dysfunction (impaired relaxation).   Antimicrobials:    Subjective: She denies worsening dyspnea. She report productive cough, thick secretion difficult  to cough secretions out.   Objective: Vitals:   12/18/20 1930 12/18/20 2300 12/19/20 0300 12/19/20 0500  BP: 122/68 106/70 135/80   Pulse: 60 60 65   Resp: (!) 24 15 20    Temp: 97.7 F (  36.5 C) 97.9 F (36.6 C) 97.7 F (36.5 C)   TempSrc: Oral Oral Oral   SpO2: 91% 96% 97%   Weight:    49.2 kg  Height:        Intake/Output Summary (Last 24 hours) at 12/19/2020 0713 Last data filed at 12/18/2020 1930 Gross per 24 hour  Intake 840 ml  Output 1950 ml  Net -1110 ml   Filed Weights   12/17/20 0407 12/18/20 0500 12/19/20 0500  Weight: 53.6 kg 52.3 kg  49.2 kg    Examination:  General exam: NAD Respiratory system: BL crackle.s  Cardiovascular system: S 1, S 2 RRR Gastrointestinal system: BS present, soft, nt Central nervous system: alert Extremities: Plus 1 edema   Data Reviewed: I have personally reviewed following labs and imaging studies  CBC: Recent Labs  Lab 12/15/20 1514 12/17/20 1452 12/17/20 1455 12/17/20 1820  WBC 9.7  --   --  10.1  HGB 15.0 14.6 14.3 14.3  HCT 48.0* 43.0 42.0 43.9  MCV 98.4  --   --  95.6  PLT 171  --   --  761   Basic Metabolic Panel: Recent Labs  Lab 12/15/20 1514 12/16/20 0423 12/17/20 0009 12/17/20 1452 12/17/20 1455 12/17/20 1820 12/18/20 0055 12/19/20 0057  NA 146* 144 144 145 145  --  143 138  K 2.7* 3.5 3.7 4.2 4.1  --  3.9 3.7  CL 104 104 104  --   --   --  102 98  CO2 30 28 30   --   --   --  31 30  GLUCOSE 147* 122* 101*  --   --   --  89 97  BUN 10 12 14   --   --   --  19 24*  CREATININE 0.91 0.88 0.87  --   --  0.84 0.81 0.88  CALCIUM 9.2 9.0 8.6*  --   --   --  8.9 9.5  MG 2.0  --   --   --   --   --   --   --    GFR: Estimated Creatinine Clearance: 35.9 mL/min (by C-G formula based on SCr of 0.88 mg/dL). Liver Function Tests: No results for input(s): AST, ALT, ALKPHOS, BILITOT, PROT, ALBUMIN in the last 168 hours. No results for input(s): LIPASE, AMYLASE in the last 168 hours. No results for input(s): AMMONIA in the last 168 hours. Coagulation Profile: No results for input(s): INR, PROTIME in the last 168 hours. Cardiac Enzymes: No results for input(s): CKTOTAL, CKMB, CKMBINDEX, TROPONINI in the last 168 hours. BNP (last 3 results) No results for input(s): PROBNP in the last 8760 hours. HbA1C: No results for input(s): HGBA1C in the last 72 hours. CBG: No results for input(s): GLUCAP in the last 168 hours. Lipid Profile: No results for input(s): CHOL, HDL, LDLCALC, TRIG, CHOLHDL, LDLDIRECT in the last 72 hours. Thyroid Function Tests: No results for  input(s): TSH, T4TOTAL, FREET4, T3FREE, THYROIDAB in the last 72 hours. Anemia Panel: Recent Labs    12/18/20 0055 12/19/20 0057  VITAMINB12 223  --   FERRITIN  --  57   Sepsis Labs: No results for input(s): PROCALCITON, LATICACIDVEN in the last 168 hours.  Recent Results (from the past 240 hour(s))  Resp Panel by RT-PCR (Flu A&B, Covid) Nasopharyngeal Swab     Status: None   Collection Time: 12/15/20  6:13 PM   Specimen: Nasopharyngeal Swab; Nasopharyngeal(NP) swabs in vial transport medium  Result Value Ref  Range Status   SARS Coronavirus 2 by RT PCR NEGATIVE NEGATIVE Final    Comment: (NOTE) SARS-CoV-2 target nucleic acids are NOT DETECTED.  The SARS-CoV-2 RNA is generally detectable in upper respiratory specimens during the acute phase of infection. The lowest concentration of SARS-CoV-2 viral copies this assay can detect is 138 copies/mL. A negative result does not preclude SARS-Cov-2 infection and should not be used as the sole basis for treatment or other patient management decisions. A negative result may occur with  improper specimen collection/handling, submission of specimen other than nasopharyngeal swab, presence of viral mutation(s) within the areas targeted by this assay, and inadequate number of viral copies(<138 copies/mL). A negative result must be combined with clinical observations, patient history, and epidemiological information. The expected result is Negative.  Fact Sheet for Patients:  EntrepreneurPulse.com.au  Fact Sheet for Healthcare Providers:  IncredibleEmployment.be  This test is no t yet approved or cleared by the [place] FDA and  has been authorized for detection and/or diagnosis of SARS-CoV-2 by FDA under an Emergency Use Authorization (EUA). This EUA will remain  in effect (meaning this test can be used) for the duration of the COVID-19 declaration under Section 564(b)(1) of the Act,  21 U.S.C.section 360bbb-3(b)(1), unless the authorization is terminated  or revoked sooner.       Influenza A by PCR NEGATIVE NEGATIVE Final   Influenza B by PCR NEGATIVE NEGATIVE Final    Comment: (NOTE) The Xpert Xpress SARS-CoV-2/FLU/RSV plus assay is intended as an aid in the diagnosis of influenza from Nasopharyngeal swab specimens and should not be used as a sole basis for treatment. Nasal washings and aspirates are unacceptable for Xpert Xpress SARS-CoV-2/FLU/RSV testing.  Fact Sheet for Patients: EntrepreneurPulse.com.au  Fact Sheet for Healthcare Providers: IncredibleEmployment.be  This test is not yet approved or cleared by the [place] FDA and has been authorized for detection and/or diagnosis of SARS-CoV-2 by FDA under an Emergency Use Authorization (EUA). This EUA will remain in effect (meaning this test can be used) for the duration of the COVID-19 declaration under Section 564(b)(1) of the Act, 21 U.S.C. section 360bbb-3(b)(1), unless the authorization is terminated or revoked.  Performed at [facility], [place] [address], [place], [place] [address]   MRSA PCR Screening     Status: None   Collection Time: 12/16/20  3:20 PM   Specimen: Nasal Mucosa; Nasopharyngeal  Result Value Ref Range Status   MRSA by PCR NEGATIVE NEGATIVE Final    Comment:        The GeneXpert MRSA Assay (FDA approved for NASAL specimens only), is one component of a comprehensive MRSA colonization surveillance program. It is not intended to diagnose MRSA infection nor to guide or monitor treatment for MRSA infections. Performed at [facility], [place] [address], [place], [place] [address]          Radiology Studies: CARDIAC CATHETERIZATION  Result Date: 12/17/2020  Prox LAD to Mid LAD lesion is 20% stenosed.  Prox RCA lesion is 25% stenosed.  LV end diastolic pressure is mildly elevated.  Hemodynamic findings consistent  with mild pulmonary hypertension.  1. Minimal nonobstructive CAD 2. Mildly elevated LVEDP 3. Mild pulmonary HTN. Mean PAP 22 mm Hg 4. Reduced cardiac output 2.6 L/min with index 1.72 Plan: medical management.        Scheduled Meds: . aspirin EC  81 mg Oral QHS  . enoxaparin (LOVENOX) injection  40 mg Subcutaneous Q24H  . furosemide  80 mg  Intravenous Q12H  . guaiFENesin  600 mg Oral BID  . hydrALAZINE  10 mg Oral Q8H  . isosorbide mononitrate  30 mg Oral Daily  . losartan  100 mg Oral Daily  . pravastatin  40 mg Oral q1800  . sodium chloride flush  3 mL Intravenous Q12H  . sodium chloride flush  3 mL Intravenous Q12H  . sodium chloride flush  3 mL Intravenous Q12H  . spironolactone  25 mg Oral Daily  . venlafaxine XR  75 mg Oral QPC breakfast  . vitamin B-12  100 mcg Oral Daily  . Vitamin D (Ergocalciferol)  50,000 Units Oral Q7 days   Continuous Infusions: . sodium chloride    . sodium chloride       LOS: 4 days    Time spent: 35 minutes.     Elmarie Shiley, MD [facility]   If 7PM-7AM, please contact night-coverage [email or web address]  12/19/2020, 7:13 AM

## 2020-12-20 ENCOUNTER — Encounter (HOSPITAL_COMMUNITY): Payer: Self-pay | Admitting: Cardiology

## 2020-12-20 DIAGNOSIS — I5041 Acute combined systolic (congestive) and diastolic (congestive) heart failure: Secondary | ICD-10-CM | POA: Diagnosis not present

## 2020-12-20 DIAGNOSIS — E785 Hyperlipidemia, unspecified: Secondary | ICD-10-CM

## 2020-12-20 DIAGNOSIS — I429 Cardiomyopathy, unspecified: Secondary | ICD-10-CM | POA: Diagnosis not present

## 2020-12-20 DIAGNOSIS — I2510 Atherosclerotic heart disease of native coronary artery without angina pectoris: Secondary | ICD-10-CM | POA: Diagnosis not present

## 2020-12-20 LAB — BASIC METABOLIC PANEL
Anion gap: 9 (ref 5–15)
BUN: 28 mg/dL — ABNORMAL HIGH (ref 8–23)
CO2: 31 mmol/L (ref 22–32)
Calcium: 9.9 mg/dL (ref 8.9–10.3)
Chloride: 95 mmol/L — ABNORMAL LOW (ref 98–111)
Creatinine, Ser: 1.21 mg/dL — ABNORMAL HIGH (ref 0.44–1.00)
GFR, Estimated: 44 mL/min — ABNORMAL LOW (ref >60)
Glucose, Bld: 120 mg/dL — ABNORMAL HIGH (ref 70–99)
Potassium: 3.5 mmol/L (ref 3.5–5.1)
Sodium: 135 mmol/L (ref 135–145)

## 2020-12-20 MED ORDER — IPRATROPIUM-ALBUTEROL 0.5-2.5 (3) MG/3ML IN SOLN
3.0000 mL | Freq: Two times a day (BID) | RESPIRATORY_TRACT | Status: DC
  Administered 2020-12-20: 3 mL via RESPIRATORY_TRACT
  Filled 2020-12-20: qty 3

## 2020-12-20 MED ORDER — ENOXAPARIN SODIUM 30 MG/0.3ML ~~LOC~~ SOLN
30.0000 mg | SUBCUTANEOUS | Status: DC
  Administered 2020-12-21 – 2020-12-27 (×7): 30 mg via SUBCUTANEOUS
  Filled 2020-12-20 (×7): qty 0.3

## 2020-12-20 MED ORDER — IPRATROPIUM-ALBUTEROL 0.5-2.5 (3) MG/3ML IN SOLN
3.0000 mL | Freq: Three times a day (TID) | RESPIRATORY_TRACT | Status: DC
  Administered 2020-12-20: 3 mL via RESPIRATORY_TRACT
  Filled 2020-12-20: qty 3

## 2020-12-20 NOTE — Progress Notes (Signed)
SATURATION QUALIFICATIONS: (This note is used to comply with regulatory documentation for home oxygen)  Patient Saturations on Room Air at Rest = 90%  Patient Saturations on Room Air while Ambulating = 90%  Patient Saturations on 0 Liters of oxygen while Ambulating = 90%  Please briefly explain why patient needs home oxygen:  When patient sleeps, Oxygen saturations on room air drop below 88% and remain below 88% until [place] is reapplied.  See vital sign flowsheet from 12/20/2020 from 0730 to 0807.  Patient had taken oxygen off and fell asleep during that time period.

## 2020-12-20 NOTE — Discharge Instructions (Signed)
Heart Failure Eating Plan Heart failure, also called congestive heart failure, occurs when your heart does not pump blood well enough to meet your body's needs for oxygen-rich blood. Heart failure is a long-term (chronic) condition. Living with heart failure can be challenging. Following your health care provider's instructions about a healthy lifestyle and working with a dietitian to choose the right foods may help to improve your symptoms. An eating plan for someone with heart failure will include changes that limit the intake of salt (sodium) and unhealthy fat. What are tips for following this plan? Reading food labels  Check food labels for the amount of sodium per serving. Choose foods that have less than 140 mg (milligrams) of sodium in each serving.  Check food labels for the number of calories per serving. This is important if you need to limit your daily calorie intake to lose weight.  Check food labels for the serving size. If you eat more than one serving, you will be eating more sodium and calories than what is listed on the label.  Look for foods that are labeled as "sodium-free," "very low sodium," or "low sodium." ? Foods labeled as "reduced sodium" or "lightly salted" may still have more sodium than what is recommended for you. Cooking  Avoid adding salt when cooking. Ask your health care provider or dietitian before using salt substitutes.  Season food with salt-free seasonings, spices, or herbs. Check the label of seasoning mixes to make sure they do not contain salt.  Cook with heart-healthy oils, such as olive, canola, soybean, or sunflower oil.  Do not fry foods. Cook foods using low-fat methods, such as baking, boiling, grilling, and broiling.  Limit unhealthy fats when cooking by: ? Removing the skin from poultry, such as chicken. ? Removing all visible fats from meats. ? Skimming the fat off from stews, soups, and gravies before serving them. Meal planning  Limit  your intake of: ? Processed, canned, or prepackaged foods. ? Foods that are high in trans fat, such as fried foods. ? Sweets, desserts, sugary drinks, and other foods with added sugar. ? Full-fat dairy products, such as whole milk.  Eat a balanced diet. This may include: ? 4-5 servings of fruit each day and 4-5 servings of vegetables each day. At each meal, try to fill one-half of your plate with fruits and vegetables. ? Up to 6-8 servings of whole grains each day. ? Up to 2 servings of lean meat, poultry, or fish each day. One serving of meat is equal to 3 oz (85 g). This is about the same size as a deck of cards. ? 2 servings of low-fat dairy each day. ? Heart-healthy fats. Healthy fats called omega-3 fatty acids are found in foods such as flaxseed and cold-water fish like sardines, salmon, and mackerel.  Aim to eat 25-35 g (grams) of fiber a day. Foods that are high in fiber include apples, broccoli, carrots, beans, peas, and whole grains.  Do not add salt or condiments that contain salt (such as soy sauce) to foods before eating.  When eating at a restaurant, ask that your food be prepared with less salt or no salt, if possible.  Try to eat 2 or more vegetarian meals each week.  Eat more home-cooked food and eat less restaurant, buffet, and fast food.   General information  Do not eat more than 2,300 mg of sodium a day. The amount of sodium that is recommended for you may be lower, depending on your   condition.  Maintain a healthy body weight as directed. Ask your health care provider what a healthy weight is for you. ? Check your weight every day. ? Work with your health care provider and dietitian to make a plan that is right for you to lose weight or maintain your current weight.  Limit how much fluid you drink. Ask your health care provider or dietitian how much fluid you can have each day.  Limit or avoid alcohol as told by your health care provider or dietitian. Recommended  foods Fruits All fresh, frozen, and canned fruits. Dried fruits, such as raisins, prunes, and cranberries. Vegetables All fresh vegetables. Vegetables that are frozen without sauce or added salt. Low-sodium or sodium-free canned vegetables. Grains Bread with less than 80 mg of sodium per slice. Whole-wheat pasta, quinoa, and brown rice. Oats and oatmeal. Barley. Millet. Grits and cream of wheat. Whole-grain and whole-wheat cold cereal. Meats and other protein foods Lean cuts of meat. Skinless chicken and turkey. Fish with high omega-3 fatty acids, such as salmon, sardines, and other cold-water fishes. Eggs. Dried beans, peas, and edamame. Unsalted nuts and nut butters. Dairy Low-fat or nonfat (skim) milk and dried milk. Rice milk, soy milk, and almond milk. Low-fat or nonfat yogurt. Small amounts of reduced-sodium block cheese. Low-sodium cottage cheese. Fats and oils Olive, canola, soybean, flaxseed, avocado, or sunflower oil. Sweets and desserts Applesauce. Granola bars. Sugar-free pudding and gelatin. Frozen fruit bars. Seasoning and other foods Fresh and dried herbs. Lemon or lime juice. Vinegar. Low-sodium ketchup. Salt-free marinades, salad dressings, sauces, and seasonings. The items listed above may not be a complete list of foods and beverages you can eat. Contact a dietitian for more information. Foods to avoid Fruits Fruits that are dried with sodium-containing preservatives. Vegetables Canned vegetables. Frozen vegetables with sauce or seasonings. Creamed vegetables. French fries. Onion rings. Pickled vegetables and sauerkraut. Grains Bread with more than 80 mg of sodium per slice. Hot or cold cereal with more than 140 mg sodium per serving. Salted pretzels and crackers. Prepackaged breadcrumbs. Bagels, croissants, and biscuits. Meats and other protein foods Ribs and chicken wings. Bacon, ham, pepperoni, bologna, salami, and packaged luncheon meats. Hot dogs, bratwurst, and  sausage. Canned meat. Smoked meat and fish. Salted nuts and seeds. Dairy Whole milk, half-and-half, and cream. Buttermilk. Processed cheese, cheese spreads, and cheese curds. Regular cottage cheese. Feta cheese. Shredded cheese. String cheese. Fats and oils Butter, lard, shortening, ghee, and bacon fat. Canned and packaged gravies. Seasoning and other foods Onion salt, garlic salt, table salt, and sea salt. Marinades. Regular salad dressings. Relishes, pickles, and olives. Meat flavorings and tenderizers, and bouillon cubes. Horseradish, ketchup, and mustard. Worcestershire sauce. Teriyaki sauce, soy sauce (including reduced sodium). Hot sauce and Tabasco sauce. Steak sauce, fish sauce, oyster sauce, and cocktail sauce. Taco seasonings. Barbecue sauce. Tartar sauce. The items listed above may not be a complete list of foods and beverages you should avoid. Contact a dietitian for more information. Summary  A heart failure eating plan includes changes that limit your intake of sodium and unhealthy fat, and it may help you lose weight or maintain a healthy weight. Your health care provider may also recommend limiting how much fluid you drink.  Most people with heart failure should eat no more than 2,300 mg of salt (sodium) a day. The amount of sodium that is recommended for you may be lower, depending on your condition.  Contact your health care provider or dietitian before making any major   changes to your diet. This information is not intended to replace advice given to you by your health care provider. Make sure you discuss any questions you have with your health care provider. Document Revised: 05/24/2020 Document Reviewed: 05/24/2020 Elsevier Patient Education  2021 Elsevier Inc.  

## 2020-12-20 NOTE — Progress Notes (Signed)
SATURATION QUALIFICATIONS: (This note is used to comply with regulatory documentation for home oxygen)  Patient Saturations on Room Air at Rest = 90%  Patient Saturations on Room Air while Ambulating = 88%    Please briefly explain why patient needs home oxygen:Pt decreased to 88% for less than 10 seconds during ambulation on RA. With verbal cueing for breathing SpO2 increased to >90%  Lyanne Co, DPT [facility] [phone]

## 2020-12-20 NOTE — Progress Notes (Signed)
   12/20/20 0800  Assess: MEWS Score  BP 92/76  ECG Heart Rate 70  Resp (!) 39  SpO2 (!) 88 %  O2 Device Room Air  Patient Activity (if Appropriate) In bed (Patient removed O2 while sleeping, sats down to 84-85%)  Assess: MEWS Score  MEWS Temp 0  MEWS Systolic 1  MEWS Pulse 0  MEWS RR 3  MEWS LOC 0  MEWS Score 4  MEWS Score Color Red  Assess: if the MEWS score is Yellow or Red  Were vital signs taken at a resting state? Yes  Focused Assessment No change from prior assessment  Early Detection of Sepsis Score *See Row Information* Low  MEWS guidelines implemented *See Row Information* No, previously red, continue vital signs every 4 hours  Escalate  MEWS: Escalate Red: discuss with charge nurse/RN and provider, consider discussing with RRT  Notify: Charge Nurse/RN  Name of Charge Nurse/RN Notified Kristy  Notify: Provider  Provider Name/Title Regalado MD (MD is on the unit)  Document  Patient Outcome Other (Comment) (Patient is stable getting up to bathroom)  Progress note created (see row info) Yes

## 2020-12-20 NOTE — Progress Notes (Signed)
PROGRESS NOTE    [name]  CVE:[id number] DOB: 1933-12-06 DOA: 12/15/2020 PCP: Hulan Fess, MD   Brief Narrative: 87 year old with past medical history significant for hypertension, hyponatremia secondary to hydrochlorothiazide, COPD, anxiety depression who presented with new onset of shortness of breath.  Patient reports her symptoms started 4 to 5 days prior to admission, she initially started with exertional dyspnea, since the day of admission her shortness of breath is constant.  Presents with significantly elevated blood pressure, fluid overload, chest x-ray with bilateral pleural effusion, potassium 2.7. Patient was found to have new reduce Ef 25 %  Assessment & Plan:   Principal Problem:   Acute combined systolic and diastolic CHF, NYHA class 4 (HCC) Active Problems:   Hypertension   Cardiomyopathy ([place]), type unknown   COPD suggested by initial evaluation ([place])  1-Acute systolic and Diastolic Heart Failure Exacerbation: -Patient presents with Dyspnea. Chest x-ray: With increase opacity could reflect pulmonary edema. BNP; 3235. -Plan to hold lasix due to increase cr. Oral lasix tomorrow depending on renal function .  -Weight: 118---115---108---111 -ECHO: Ef 25 %, diastolic dysfunction grade one, Mitral Valve regurgitation.  -Cardiology consulted, recommend ischemic evaluation during this admission.  -Cath; No significant CAD>  -Started on Cozaar, Imdur, spironolactone.   Hypokalemia: Started on spironolactone.  Resolved.   Hyperetension: Continue with Cozaar and lasix.  On low dose hydralazine, Imdur.   COPD: Schedule nebulizer.  Increased  Mucinex dose.  Flutter valve.   HLD: Continue with Pravachol.   Elevated Blood sugar: pre diabetes range. Diet modification.  HbA1c; 5.8  Memory problems; worse over last 6 months.  -B 12 low normal. started supplement.   Vitamin D deficiency; started supplement.  -Needs out patient referral to neurology for  dementia evaluation.   AKI; likey related to lasix. Hold lasix   Estimated body mass index is 21.12 kg/m as calculated from the following:   Height as of this encounter: 5\' 1"  (1.549 m).   Weight as of this encounter: 50.7 kg.   DVT prophylaxis: Lovenox Code Status: Full code Family Communication: care discussed with patient. Daughter updated 2/26 Disposition Plan:  Status is: Inpatient  Remains inpatient appropriate because:IV treatments appropriate due to intensity of illness or inability to take PO   Dispo: The patient is from: Home              Anticipated d/c is to: to be determine              Anticipated d/c date is: 2 days              Patient currently is not medically stable to d/c.   Difficult to place patient No        Consultants:   None  Procedures:   ECHO: Left Ventricle: Left ventricular ejection fraction, by estimation, is 25  to 30%. Left ventricular ejection fraction by 3D volume is 31 %. The left  ventricle has severely decreased function. The left ventricle demonstrates  global hypokinesis. The left  ventricular internal cavity size was normal in size. There is mild left  ventricular hypertrophy. Left ventricular diastolic parameters are  consistent with Grade I diastolic dysfunction (impaired relaxation).   Antimicrobials:    Subjective: Report cough, nurse will make sure patient use incentive spirometry.  Report breathing better. Feels tired.   Objective: Vitals:   12/20/20 0400 12/20/20 0500 12/20/20 0715 12/20/20 0849  BP: 120/67  109/81   Pulse:   86   Resp: 20  12   Temp: (!) 97.4 F (36.3 C)  98.5 F (36.9 C)   TempSrc: Axillary  Oral   SpO2: 95%  93% 93%  Weight:  50.7 kg    Height:        Intake/Output Summary (Last 24 hours) at 12/20/2020 0927 Last data filed at 12/20/2020 0830 Gross per 24 hour  Intake 950 ml  Output 2150 ml  Net -1200 ml   Filed Weights   12/18/20 0500 12/19/20 0500 12/20/20 0500  Weight: 52.3  kg 49.2 kg 50.7 kg    Examination:  General exam: NAD Respiratory system: B/L crackles.  Cardiovascular system: S 1, S 2 RRR Gastrointestinal system: BS present, soft, nt, ND Central nervous system: Alert.  Extremities: Trace, I edema.    Data Reviewed: I have personally reviewed following labs and imaging studies  CBC: Recent Labs  Lab 12/15/20 1514 12/17/20 1452 12/17/20 1455 12/17/20 1820  WBC 9.7  --   --  10.1  HGB 15.0 14.6 14.3 14.3  HCT 48.0* 43.0 42.0 43.9  MCV 98.4  --   --  95.6  PLT 171  --   --  497   Basic Metabolic Panel: Recent Labs  Lab 12/15/20 1514 12/16/20 0423 12/17/20 0009 12/17/20 1452 12/17/20 1455 12/17/20 1820 12/18/20 0055 12/19/20 0057 12/20/20 0035  NA 146* 144 144 145 145  --  143 138 135  K 2.7* 3.5 3.7 4.2 4.1  --  3.9 3.7 3.5  CL 104 104 104  --   --   --  102 98 95*  CO2 30 28 30   --   --   --  31 30 31   GLUCOSE 147* 122* 101*  --   --   --  89 97 120*  BUN 10 12 14   --   --   --  19 24* 28*  CREATININE 0.91 0.88 0.87  --   --  0.84 0.81 0.88 1.21*  CALCIUM 9.2 9.0 8.6*  --   --   --  8.9 9.5 9.9  MG 2.0  --   --   --   --   --   --   --   --    GFR: Estimated Creatinine Clearance: 26.1 mL/min (A) (by C-G formula based on SCr of 1.21 mg/dL (H)). Liver Function Tests: No results for input(s): AST, ALT, ALKPHOS, BILITOT, PROT, ALBUMIN in the last 168 hours. No results for input(s): LIPASE, AMYLASE in the last 168 hours. No results for input(s): AMMONIA in the last 168 hours. Coagulation Profile: No results for input(s): INR, PROTIME in the last 168 hours. Cardiac Enzymes: No results for input(s): CKTOTAL, CKMB, CKMBINDEX, TROPONINI in the last 168 hours. BNP (last 3 results) No results for input(s): PROBNP in the last 8760 hours. HbA1C: No results for input(s): HGBA1C in the last 72 hours. CBG: No results for input(s): GLUCAP in the last 168 hours. Lipid Profile: No results for input(s): CHOL, HDL, LDLCALC, TRIG,  CHOLHDL, LDLDIRECT in the last 72 hours. Thyroid Function Tests: No results for input(s): TSH, T4TOTAL, FREET4, T3FREE, THYROIDAB in the last 72 hours. Anemia Panel: Recent Labs    12/18/20 0055 12/19/20 0057  VITAMINB12 223  --   FERRITIN  --  57   Sepsis Labs: No results for input(s): PROCALCITON, LATICACIDVEN in the last 168 hours.  Recent Results (from the past 240 hour(s))  Resp Panel by RT-PCR (Flu A&B, Covid) Nasopharyngeal Swab     Status: None  Collection Time: 12/15/20  6:13 PM   Specimen: Nasopharyngeal Swab; Nasopharyngeal(NP) swabs in vial transport medium  Result Value Ref Range Status   SARS Coronavirus 2 by RT PCR NEGATIVE NEGATIVE Final    Comment: (NOTE) SARS-CoV-2 target nucleic acids are NOT DETECTED.  The SARS-CoV-2 RNA is generally detectable in upper respiratory specimens during the acute phase of infection. The lowest concentration of SARS-CoV-2 viral copies this assay can detect is 138 copies/mL. A negative result does not preclude SARS-Cov-2 infection and should not be used as the sole basis for treatment or other patient management decisions. A negative result may occur with  improper specimen collection/handling, submission of specimen other than nasopharyngeal swab, presence of viral mutation(s) within the areas targeted by this assay, and inadequate number of viral copies(<138 copies/mL). A negative result must be combined with clinical observations, patient history, and epidemiological information. The expected result is Negative.  Fact Sheet for Patients:  EntrepreneurPulse.com.au  Fact Sheet for Healthcare Providers:  IncredibleEmployment.be  This test is no t yet approved or cleared by the [place] FDA and  has been authorized for detection and/or diagnosis of SARS-CoV-2 by FDA under an Emergency Use Authorization (EUA). This EUA will remain  in effect (meaning this test can be used) for the  duration of the COVID-19 declaration under Section 564(b)(1) of the Act, 21 U.S.C.section 360bbb-3(b)(1), unless the authorization is terminated  or revoked sooner.       Influenza A by PCR NEGATIVE NEGATIVE Final   Influenza B by PCR NEGATIVE NEGATIVE Final    Comment: (NOTE) The Xpert Xpress SARS-CoV-2/FLU/RSV plus assay is intended as an aid in the diagnosis of influenza from Nasopharyngeal swab specimens and should not be used as a sole basis for treatment. Nasal washings and aspirates are unacceptable for Xpert Xpress SARS-CoV-2/FLU/RSV testing.  Fact Sheet for Patients: EntrepreneurPulse.com.au  Fact Sheet for Healthcare Providers: IncredibleEmployment.be  This test is not yet approved or cleared by the [place] FDA and has been authorized for detection and/or diagnosis of SARS-CoV-2 by FDA under an Emergency Use Authorization (EUA). This EUA will remain in effect (meaning this test can be used) for the duration of the COVID-19 declaration under Section 564(b)(1) of the Act, 21 U.S.C. section 360bbb-3(b)(1), unless the authorization is terminated or revoked.  Performed at [facility], [place] [address], [place], [place] [address]   MRSA PCR Screening     Status: None   Collection Time: 12/16/20  3:20 PM   Specimen: Nasal Mucosa; Nasopharyngeal  Result Value Ref Range Status   MRSA by PCR NEGATIVE NEGATIVE Final    Comment:        The GeneXpert MRSA Assay (FDA approved for NASAL specimens only), is one component of a comprehensive MRSA colonization surveillance program. It is not intended to diagnose MRSA infection nor to guide or monitor treatment for MRSA infections. Performed at [facility], [place] [address], [place], [place] [address]          Radiology Studies: No results found.      Scheduled Meds: . aspirin EC  81 mg Oral QHS  . diclofenac Sodium  2 g Topical QID  . enoxaparin (LOVENOX)  injection  40 mg Subcutaneous Q24H  . guaiFENesin  1,200 mg Oral BID  . hydrALAZINE  10 mg Oral Q8H  . ipratropium-albuterol  3 mL Nebulization TID  . isosorbide mononitrate  30 mg Oral Daily  . losartan  100 mg Oral Daily  . pravastatin  40 mg Oral q1800  . sodium chloride flush  3 mL Intravenous Q12H  . spironolactone  25 mg Oral Daily  . venlafaxine XR  75 mg Oral QPC breakfast  . vitamin B-12  100 mcg Oral Daily  . Vitamin D (Ergocalciferol)  50,000 Units Oral Q7 days   Continuous Infusions: . sodium chloride       LOS: 5 days    Time spent: 35 minutes.     Elmarie Shiley, MD [facility]   If 7PM-7AM, please contact night-coverage [email or web address]  12/20/2020, 9:27 AM

## 2020-12-20 NOTE — Progress Notes (Signed)
Progress Note  Patient Name: [name] Date of Encounter: 12/20/2020  [facility] Cardiologist: Donato Heinz, MD   Subjective   No acute events overnight. Feels fatigued but sleeping well. Still coughing up yellow phlegm. Discussed renal function and heart rate today, see below. No chest pain. Breathing stable.  Inpatient Medications    Scheduled Meds: . aspirin EC  81 mg Oral QHS  . diclofenac Sodium  2 g Topical QID  . enoxaparin (LOVENOX) injection  40 mg Subcutaneous Q24H  . guaiFENesin  1,200 mg Oral BID  . hydrALAZINE  10 mg Oral Q8H  . ipratropium-albuterol  3 mL Nebulization TID  . isosorbide mononitrate  30 mg Oral Daily  . losartan  100 mg Oral Daily  . pravastatin  40 mg Oral q1800  . sodium chloride flush  3 mL Intravenous Q12H  . spironolactone  25 mg Oral Daily  . venlafaxine XR  75 mg Oral QPC breakfast  . vitamin B-12  100 mcg Oral Daily  . Vitamin D (Ergocalciferol)  50,000 Units Oral Q7 days   Continuous Infusions: . sodium chloride     PRN Meds: sodium chloride, acetaminophen, ALPRAZolam, benzonatate, docusate sodium, ondansetron (ZOFRAN) IV, sodium chloride flush   Vital Signs    Vitals:   12/20/20 0800 12/20/20 0805 12/20/20 0807 12/20/20 0849  BP: 92/76     Pulse:      Resp: (!) 39 (!) 22 (!) 30   Temp:      TempSrc:      SpO2: (!) 88% (!) 87% 91% 93%  Weight:      Height:        Intake/Output Summary (Last 24 hours) at 12/20/2020 1003 Last data filed at 12/20/2020 0830 Gross per 24 hour  Intake 950 ml  Output 2150 ml  Net -1200 ml   Last 3 Weights 12/20/2020 12/19/2020 12/18/2020  Weight (lbs) 111 lb 12.4 oz 108 lb 7.5 oz 115 lb 4.8 oz  Weight (kg) 50.7 kg 49.2 kg 52.3 kg      Telemetry    Sinus rhythm with intermittent PVCs - Personally Reviewed  ECG    No new since 2/23- Personally Reviewed  Physical Exam   GEN: No acute distress.  Sitting in chair on room air Neck: No JVD at 90 degrees Cardiac: RRR, no  murmurs, rubs, or gallops.  Respiratory: Coarse diffusely, improves with cough, scant crackles at bases GI: Soft, nontender, non-distended  MS: No edema; No deformity. Neuro:  Nonfocal  Psych: Normal affect   Labs    High Sensitivity Troponin:   Recent Labs  Lab 12/15/20 1514  TROPONINIHS 58*      Chemistry Recent Labs  Lab 12/18/20 0055 12/19/20 0057 12/20/20 0035  NA 143 138 135  K 3.9 3.7 3.5  CL 102 98 95*  CO2 31 30 31   GLUCOSE 89 97 120*  BUN 19 24* 28*  CREATININE 0.81 0.88 1.21*  CALCIUM 8.9 9.5 9.9  GFRNONAA >60 >60 44*  ANIONGAP 10 10 9      Hematology Recent Labs  Lab 12/15/20 1514 12/17/20 1452 12/17/20 1455 12/17/20 1820  WBC 9.7  --   --  10.1  RBC 4.88  --   --  4.59  HGB 15.0 14.6 14.3 14.3  HCT 48.0* 43.0 42.0 43.9  MCV 98.4  --   --  95.6  MCH 30.7  --   --  31.2  MCHC 31.3  --   --  32.6  RDW 15.8*  --   --  16.0*  PLT 171  --   --  193    BNP Recent Labs  Lab 12/15/20 1514  BNP 3,235.0*     DDimer No results for input(s): DDIMER in the last 168 hours.   Radiology    No results found.  Cardiac Studies   Echo 12/16/20 1. Left ventricular ejection fraction, by estimation, is 25 to 30%. Left  ventricular ejection fraction by 3D volume is 31 %. The left ventricle has  severely decreased function. The left ventricle demonstrates global  hypokinesis. There is mild left  ventricular hypertrophy. Left ventricular diastolic parameters are  consistent with Grade I diastolic dysfunction (impaired relaxation).  2. Right ventricular systolic function is normal. The right ventricular  size is normal. There is mildly elevated pulmonary artery systolic  pressure.  3. Left atrial size was severely dilated.  4. The mitral valve is degenerative. Moderate mitral valve regurgitation.  No evidence of mitral stenosis. Moderate mitral annular calcification.  5. The aortic valve is calcified. Aortic valve regurgitation is trivial.  Mild  aortic valve sclerosis is present, with no evidence of aortic valve  stenosis.  6. Aortic dilatation noted. There is mild dilatation of the ascending  aorta, measuring 39 mm.  7. The inferior vena cava is normal in size with greater than 50%  respiratory variability, suggesting right atrial pressure of 3 mmHg.  R/LHC 12/17/20  Prox LAD to Mid LAD lesion is 20% stenosed.  Prox RCA lesion is 25% stenosed.  LV end diastolic pressure is mildly elevated.  Hemodynamic findings consistent with mild pulmonary hypertension.   1. Minimal nonobstructive CAD 2. Mildly elevated LVEDP 3. Mild pulmonary HTN. Mean PAP 22 mm Hg 4. Reduced cardiac output 2.6 L/min with index 1.72  Plan: medical management.   Patient Profile     87 y.o. female with PMH hypertension, hyperlipidemia, COPD, hyponatremia 2/2 HCTZ who is being followed in consultation at the request of Dr. Tyrell Antonio for new diagnosis of acute systolic and diastolic heart failure  Assessment & Plan    Acute systolic and diastolic heart failure, EF 25-30% Nonischemic cardiomyopathy -echo, cath this admission as above -BNP on admission 3235 -on losartan, spironolactone, hydralazine, isosorbide -had intermittently low BP, 109/81 this morning. No room to add entresto at this time -beta blocker had not been started due to intermittent bradycardia. Heart rate averaging around 60 at rest. We will see what her heart rate does with ambulation. Will try to add 12.5 mg metoprolol succinate if heart rate and blood pressure allow. -recommend SGLT2i, but with rise in Cr will not start today -Admission weight not charted. Weight on 2/25 53.6 kg, weight this morning 50.7 kg. I/O not well documented (especially intake), therefore documentation of 9 L net negative likely inaccurate -Cr up today from 0.88 yesterday to 1.21 today. Holding lasix. BUN up, Cl down as well -K 3.5 today -NICM workup: TSH unremarkable, iron studies unremarable, cortisol  unremarkable, A1c 5.8. SPEP pending -strict I/O, daily weights, fluid restriction -consider oral diuretic starting tomorrow based on renal function  Nonobstructive CAD Hyperlipidemia -aspirin 81 mg started this admission -on lovastatin as an outpatient, pravastatin as an inpatient -LDL goal <70  COPD -per primary team  Mild thoracic aortic dilation -follow up as outpatient  For questions or updates, please contact [facility] Please consult [email or web address] for contact info under     Signed, Buford Dresser, MD  12/20/2020, 10:03 AM

## 2020-12-20 NOTE — Progress Notes (Signed)
Physical Therapy Treatment Patient Details Name: [name] MRN: [id number] DOB: [birth year] Today's Date: 12/20/2020    History of Present Illness Pt is an 87 y/o female admitted secondary to SOB and BLE swelling. Thought to be secondary to acute CHF. PMH includes HTN and COPD.    PT Comments    Pt was tired following lunch, but agreeable to ambulation. Pt monitored during ambulation on RA for SpO2. Pt able to remain> 88% during ambulation on RA. Pt with decreased velocity and increased UE weight bearing through RW during ambulation. Cueing needed to improve positioning in RW. Pt will cotninue to benefit from skilled PT to address deficits to maximize independence with functional mobility prior to discharge.     Follow Up Recommendations  Home health PT;Supervision for mobility/OOB     Equipment Recommendations  None recommended by PT    Recommendations for Other Services       Precautions / Restrictions Precautions Precautions: Fall Precaution Comments: watch SpO2 Restrictions Weight Bearing Restrictions: No    Mobility  Bed Mobility Overal bed mobility: Needs Assistance Bed Mobility: Sit to Supine       Sit to supine: Supervision        Transfers Overall transfer level: Needs assistance Equipment used: Rolling walker (2 wheeled) Transfers: Sit to/from Stand Sit to Stand: Min guard            Ambulation/Gait Ambulation/Gait assistance: Min guard Gait Distance (Feet): 66 Feet Assistive device: Rolling walker (2 wheeled) Gait Pattern/deviations: Step-through pattern;Decreased stride length Gait velocity: Decreased   General Gait Details: min guard for safety, vc for proximity to RW, 1x standing rest break   Stairs             Wheelchair Mobility    Modified Rankin (Stroke Patients Only)       Balance                                            Cognition                                               Exercises      General Comments General comments (skin integrity, edema, etc.): performed ambulation on RA with SpO2 decreasing to 88% x 10 seconds but able to increase back up to 90% with verbal cueing for breathing.      Pertinent Vitals/Pain Pain Assessment: No/denies pain    Home Living                      Prior Function            PT Goals (current goals can now be found in the care plan section) Acute Rehab PT Goals Patient Stated Goal: to go home PT Goal Formulation: With patient Time For Goal Achievement: 12/30/20 Potential to Achieve Goals: Good Progress towards PT goals: Progressing toward goals    Frequency    Min 3X/week      PT Plan Current plan remains appropriate    Co-evaluation              AM-PAC PT "6 Clicks" Mobility   Outcome Measure  Help needed turning from your back to your side while in a flat bed  without using bedrails?: None Help needed moving from lying on your back to sitting on the side of a flat bed without using bedrails?: None Help needed moving to and from a bed to a chair (including a wheelchair)?: A Little Help needed standing up from a chair using your arms (e.g., wheelchair or bedside chair)?: A Little Help needed to walk in hospital room?: A Little Help needed climbing 3-5 steps with a railing? : A Little 6 Click Score: 20    End of Session Equipment Utilized During Treatment: Gait belt Activity Tolerance: Patient tolerated treatment well Patient left: in bed;with call bell/phone within reach;with bed alarm set Nurse Communication: Mobility status PT Visit Diagnosis: Other abnormalities of gait and mobility (R26.89)     Time: 1146-1200 PT Time Calculation (min) (ACUTE ONLY): 14 min  Charges:  $Therapeutic Exercise: 8-22 mins                     Lyanne Co, DPT Acute Rehabilitation Services [phone]   [place] 12/20/2020, 12:30 PM

## 2020-12-20 NOTE — Progress Notes (Signed)
Heart Failure Nurse Navigator Progress Note  PCP: Hulan Fess, MD PCP-Cardiologist: Gardiner Rhyme, MD Admission Diagnosis: new CHF Admitted from: home alone  Presentation:   Al Corpus presented with new SHOB. Spoke with daughter, [name], with patients permission as pt states daughter handles medications and bills. Daughter is main transportation; states this is all new information to them (cardiac issues). Daughter appreciated for HV TOC follow.  ECHO/ LVEF: 25-30%, G1DD  Clinical Course:  Past Medical History:  Diagnosis Date  . Acute combined systolic and diastolic CHF, NYHA class 4 ([place])   . Anxiety   . Arthritis    knees, hands   . Cancer (HCC)    SQUAMOuS CELL CARCINOMA OF SKIN- PERIANAL   . COPD (chronic obstructive pulmonary disease) ([place])    smoker  . Depression   . Elbow fracture, right 2013  . Hyperlipidemia   . Hypertension   . Osteoporosis   . Pneumonia    hx of   . S/P radiation therapy 04/08/2014-05/14/2014   Right perianal skin / 45 Gy in 25 fractions  . Wears glasses     Social History   Socioeconomic History  . Marital status: Widowed    Spouse name: Not on file  . Number of children: Not on file  . Years of education: Not on file  . Highest education level: Not on file  Occupational History  . Not on file  Tobacco Use  . Smoking status: Former Smoker    Packs/day: 0.50    Years: 50.00    Pack years: 25.00    Types: Cigarettes    Quit date: 11/06/2020    Years since quitting: 0.1  . Smokeless tobacco: Never Used  Substance and Sexual Activity  . Alcohol use: No  . Drug use: No  . Sexual activity: Never  Other Topics Concern  . Not on file  Social History Narrative  . Not on file   Social Determinants of Health   Financial Resource Strain: Low Risk   . Difficulty of Paying Living Expenses: Not very hard  Food Insecurity: No Food Insecurity  . Worried About Charity fundraiser in the Last Year: Never true  . Ran Out of Food in the  Last Year: Never true  Transportation Needs: No Transportation Needs  . Lack of Transportation (Medical): No  . Lack of Transportation (Non-Medical): No  Physical Activity: Inactive  . Days of Exercise per Week: 0 days  . Minutes of Exercise per Session: 0 min  Stress: Not on file  Social Connections: Not on file   High Risk Criteria for Readmission and/or Poor Patient Outcomes:  Heart failure hospital admissions (last 6 months): 1   No Show rate: 0%  Difficult social situation: no  Demonstrates medication adherence: yes  Primary Language: English  Literacy level: able to read/write and comprehend  Education Assessment and Provision:  Detailed education and instructions provided on heart failure disease management including the following:  Signs and symptoms of Heart Failure When to call the physician Importance of daily weights Low sodium diet Fluid restriction Medication management Anticipated future follow-up appointments  Patient education given on each of the above topics.  Patient acknowledges understanding via teach back method and acceptance of all instructions.  Education Materials:  "Living Better With Heart Failure" Booklet, HF zone tool, & Daily Weight Tracker Tool.  Patient has scale at home: yes Patient has pill box at home: yes  Barriers of Care:   -lives alone  Considerations/Referrals:  Referral made to Heart Failure Pharmacist Stewardship: yes, appreciated Referral made to Heart & Vascular TOC clinic: yes, anticipated DC 3/1. Will schedule closer to DC date.   Items for Follow-up on DC/TOC: -medication optimization/cost -education  Pricilla Holm, RN, BSN Heart Failure Nurse Navigator [phone]

## 2020-12-20 NOTE — Progress Notes (Signed)
Heart Failure Stewardship Pharmacist Progress Note   PCP: Hulan Fess, MD PCP-Cardiologist: Donato Heinz, MD    HPI:  87 yo F with PMH of HTN, HLD, COPD, squamous cell skin cancer, anxiety/depression, and hypokalemia. She presented to the ED on 12/15/20 with shortness of breath and fluid overload. Admitted for acute combine systolic and diastolic HF. An ECHO was done on 12/16/20 and LVEF is 25-30%. R/LHC done on 12/17/20 and found to have minimal nonobstructive CAD and mildly elevated filling pressures.   Current HF Medications: Losartan 100 mg daily Spironolactone 25 mg daily Hydralazine 10 mg q8h Imdur 30 mg daily  Prior to admission HF Medications: Losartan 50 mg daily  Pertinent Lab Values: . Serum creatinine 1.21, BUN 28, Potassium 3.5, Sodium 135, BNP 3235  Vital Signs: . Weight: 111 lbs (admission weight: 118 lbs) . Blood pressure: 90-120/70s  . Heart rate: 60-70s   Medication Assistance / Insurance Benefits Check: Does the patient have prescription insurance?  Yes Type of insurance plan: UHC Medicare  Does the patient qualify for medication assistance through manufacturers or grants?   Pending . Eligible grants and/or patient assistance programs: pending . Medication assistance applications in progress: none  . Medication assistance applications approved: none  Approved medication assistance renewals will be completed by: pending  Outpatient Pharmacy:  Prior to admission outpatient pharmacy: [facility] Is the patient willing to use [facility] at discharge? Yes  Assessment: 1. Acute on chronic systolic CHF (EF 61-95%), due to NICM. NYHA class III symptoms. - Consider starting carvedilol 3.125 mg BID now off IV diuretics  - On losartan 100 mg daily - consider optimizing to Entresto 24/26 mg BID pending BP trends. - Continue spironolactone 25 mg daily - Consider starting Farxiga 10 mg daily prior to discharge. Minimal to no impact on BP.  -  Continue hydralazine 10 mg q8h  - Continue Imdur 30 mg daily   Plan: 1) Medication changes recommended at this time: - Start carvedilol potentially tomorrow AM  2) Patient assistance application(s): - Entresto copay $47 per month - Farxiga copay $47 per month - Can help complete patient assistance applications to lower copay to $0 per month for both prescriptions  3)  Education  - To be completed prior to discharge  Kerby Nora, PharmD, BCPS Heart Failure Cytogeneticist Phone [phone]

## 2020-12-21 ENCOUNTER — Inpatient Hospital Stay (HOSPITAL_COMMUNITY): Payer: Medicare Other

## 2020-12-21 DIAGNOSIS — I5041 Acute combined systolic (congestive) and diastolic (congestive) heart failure: Secondary | ICD-10-CM | POA: Diagnosis not present

## 2020-12-21 DIAGNOSIS — I2510 Atherosclerotic heart disease of native coronary artery without angina pectoris: Secondary | ICD-10-CM | POA: Diagnosis not present

## 2020-12-21 DIAGNOSIS — E785 Hyperlipidemia, unspecified: Secondary | ICD-10-CM | POA: Diagnosis not present

## 2020-12-21 DIAGNOSIS — I429 Cardiomyopathy, unspecified: Secondary | ICD-10-CM | POA: Diagnosis not present

## 2020-12-21 LAB — BASIC METABOLIC PANEL
Anion gap: 14 (ref 5–15)
BUN: 33 mg/dL — ABNORMAL HIGH (ref 8–23)
CO2: 29 mmol/L (ref 22–32)
Calcium: 10.2 mg/dL (ref 8.9–10.3)
Chloride: 93 mmol/L — ABNORMAL LOW (ref 98–111)
Creatinine, Ser: 1.31 mg/dL — ABNORMAL HIGH (ref 0.44–1.00)
GFR, Estimated: 40 mL/min — ABNORMAL LOW (ref >60)
Glucose, Bld: 124 mg/dL — ABNORMAL HIGH (ref 70–99)
Potassium: 3.9 mmol/L (ref 3.5–5.1)
Sodium: 136 mmol/L (ref 135–145)

## 2020-12-21 LAB — PROTEIN ELECTROPHORESIS, SERUM
A/G Ratio: 1 (ref 0.7–1.7)
Albumin ELP: 3.1 g/dL (ref 2.9–4.4)
Alpha-1-Globulin: 0.3 g/dL (ref 0.0–0.4)
Alpha-2-Globulin: 0.9 g/dL (ref 0.4–1.0)
Beta Globulin: 1 g/dL (ref 0.7–1.3)
Gamma Globulin: 0.9 g/dL (ref 0.4–1.8)
Globulin, Total: 3.1 g/dL (ref 2.2–3.9)
Total Protein ELP: 6.2 g/dL (ref 6.0–8.5)

## 2020-12-21 LAB — CBC
HCT: 46.5 % — ABNORMAL HIGH (ref 36.0–46.0)
Hemoglobin: 15.9 g/dL — ABNORMAL HIGH (ref 12.0–15.0)
MCH: 31.2 pg (ref 26.0–34.0)
MCHC: 34.2 g/dL (ref 30.0–36.0)
MCV: 91.4 fL (ref 80.0–100.0)
Platelets: 179 10*3/uL (ref 150–400)
RBC: 5.09 MIL/uL (ref 3.87–5.11)
RDW: 15.5 % (ref 11.5–15.5)
WBC: 16 10*3/uL — ABNORMAL HIGH (ref 4.0–10.5)
nRBC: 0 % (ref 0.0–0.2)

## 2020-12-21 MED ORDER — IPRATROPIUM-ALBUTEROL 0.5-2.5 (3) MG/3ML IN SOLN
3.0000 mL | Freq: Two times a day (BID) | RESPIRATORY_TRACT | Status: DC
  Administered 2020-12-21 – 2020-12-26 (×10): 3 mL via RESPIRATORY_TRACT
  Filled 2020-12-21 (×9): qty 3

## 2020-12-21 MED ORDER — IPRATROPIUM-ALBUTEROL 0.5-2.5 (3) MG/3ML IN SOLN: 3.0000 mL | Freq: Two times a day (BID) | RESPIRATORY_TRACT | Status: DC

## 2020-12-21 MED ORDER — PANTOPRAZOLE SODIUM 40 MG PO TBEC
40.0000 mg | DELAYED_RELEASE_TABLET | Freq: Two times a day (BID) | ORAL | Status: DC
  Administered 2020-12-21 – 2020-12-27 (×12): 40 mg via ORAL
  Filled 2020-12-21 (×12): qty 1

## 2020-12-21 MED ORDER — DOCUSATE SODIUM 100 MG PO CAPS
100.0000 mg | ORAL_CAPSULE | Freq: Two times a day (BID) | ORAL | Status: DC
  Administered 2020-12-21 – 2020-12-25 (×7): 100 mg via ORAL
  Filled 2020-12-21 (×9): qty 1

## 2020-12-21 MED ORDER — PANTOPRAZOLE SODIUM 40 MG IV SOLR
40.0000 mg | Freq: Two times a day (BID) | INTRAVENOUS | Status: DC
  Administered 2020-12-21: 40 mg via INTRAVENOUS
  Filled 2020-12-21: qty 40

## 2020-12-21 MED ORDER — IPRATROPIUM-ALBUTEROL 0.5-2.5 (3) MG/3ML IN SOLN: 3.0000 mL | Freq: Two times a day (BID) | RESPIRATORY_TRACT | Status: DC | PRN

## 2020-12-21 NOTE — Care Management Important Message (Signed)
Important Message  Patient Details  Name: [name] MRN: [id number] Date of Birth: [birth year]   Medicare Important Message Given:  Yes     [name] 12/21/2020, 3:34 PM

## 2020-12-21 NOTE — Progress Notes (Signed)
Progress Note  Patient Name: [name] Date of Encounter: 12/21/2020  [facility] Cardiologist: Donato Heinz, MD   Subjective   Generally not feeling well, no specific concerns. Not much appetite. Isn't sure how much she has been drinking. Breathing unchanged. No chest pain.  Inpatient Medications    Scheduled Meds: . aspirin EC  81 mg Oral QHS  . diclofenac Sodium  2 g Topical QID  . enoxaparin (LOVENOX) injection  30 mg Subcutaneous Q24H  . guaiFENesin  1,200 mg Oral BID  . hydrALAZINE  10 mg Oral Q8H  . isosorbide mononitrate  30 mg Oral Daily  . losartan  100 mg Oral Daily  . pantoprazole  40 mg Oral BID  . pravastatin  40 mg Oral q1800  . sodium chloride flush  3 mL Intravenous Q12H  . spironolactone  25 mg Oral Daily  . venlafaxine XR  75 mg Oral QPC breakfast  . vitamin B-12  100 mcg Oral Daily  . Vitamin D (Ergocalciferol)  50,000 Units Oral Q7 days   Continuous Infusions: . sodium chloride     PRN Meds: sodium chloride, acetaminophen, ALPRAZolam, benzonatate, docusate sodium, ipratropium-albuterol, ondansetron (ZOFRAN) IV, sodium chloride flush   Vital Signs    Vitals:   12/21/20 0300 12/21/20 0619 12/21/20 0830 12/21/20 1111  BP: (!) 154/87 113/76 133/79 140/73  Pulse: 91  80 87  Resp: 18  20 20   Temp: 98.4 F (36.9 C)  98.6 F (37 C) 98.4 F (36.9 C)  TempSrc: Oral  Oral Oral  SpO2: 95%  95%   Weight: 50.6 kg     Height:        Intake/Output Summary (Last 24 hours) at 12/21/2020 1228 Last data filed at 12/21/2020 0830 Gross per 24 hour  Intake 360 ml  Output --  Net 360 ml   Last 3 Weights 12/21/2020 12/20/2020 12/19/2020  Weight (lbs) 111 lb 8.8 oz 111 lb 12.4 oz 108 lb 7.5 oz  Weight (kg) 50.6 kg 50.7 kg 49.2 kg      Telemetry    Sinus rhythm with intermittent PVCs, rates 60s-90s - Personally Reviewed  ECG    No new since 2/23- Personally Reviewed  Physical Exam   GEN: Well nourished, well developed in no acute  distress NECK: No JVD CARDIAC: regular rhythm, normal S1 and S2, no rubs or gallops. No murmur. VASCULAR: Radial pulses 2+ bilaterally.  RESPIRATORY:  Somewhat coarse/distance diffusely, no crackles appreciated. ABDOMEN: Soft, non-tender, non-distended MUSCULOSKELETAL:  Moves all 4 limbs independently SKIN: Warm and dry, no edema NEUROLOGIC:  No focal neuro deficits noted. PSYCHIATRIC:  Normal affect   Labs    High Sensitivity Troponin:   Recent Labs  Lab 12/15/20 1514  TROPONINIHS 58*      Chemistry Recent Labs  Lab 12/19/20 0057 12/20/20 0035 12/21/20 0049  NA 138 135 136  K 3.7 3.5 3.9  CL 98 95* 93*  CO2 30 31 29   GLUCOSE 97 120* 124*  BUN 24* 28* 33*  CREATININE 0.88 1.21* 1.31*  CALCIUM 9.5 9.9 10.2  GFRNONAA >60 44* 40*  ANIONGAP 10 9 14      Hematology Recent Labs  Lab 12/15/20 1514 12/17/20 1452 12/17/20 1455 12/17/20 1820  WBC 9.7  --   --  10.1  RBC 4.88  --   --  4.59  HGB 15.0 14.6 14.3 14.3  HCT 48.0* 43.0 42.0 43.9  MCV 98.4  --   --  95.6  MCH 30.7  --   --  31.2  MCHC 31.3  --   --  32.6  RDW 15.8*  --   --  16.0*  PLT 171  --   --  193    BNP Recent Labs  Lab 12/15/20 1514  BNP 3,235.0*     DDimer No results for input(s): DDIMER in the last 168 hours.   Radiology    DG Abd 1 View  Result Date: 12/21/2020 CLINICAL DATA:  Nausea and vomiting EXAM: ABDOMEN - 1 VIEW COMPARISON:  None. FINDINGS: Scattered large and small bowel gas is noted. No obstructive changes are seen. Changes of prior cholecystectomy and bilateral hip replacements are noted. Degenerative changes of lumbar spine are seen with a scoliosis concave to the right. Aortic calcifications are noted. No free air is seen. IMPRESSION: No acute abnormality noted. Electronically Signed   By: Inez Catalina M.D.   On: 12/21/2020 12:10    Cardiac Studies   Echo 12/16/20 1. Left ventricular ejection fraction, by estimation, is 25 to 30%. Left  ventricular ejection fraction by  3D volume is 31 %. The left ventricle has  severely decreased function. The left ventricle demonstrates global  hypokinesis. There is mild left  ventricular hypertrophy. Left ventricular diastolic parameters are  consistent with Grade I diastolic dysfunction (impaired relaxation).  2. Right ventricular systolic function is normal. The right ventricular  size is normal. There is mildly elevated pulmonary artery systolic  pressure.  3. Left atrial size was severely dilated.  4. The mitral valve is degenerative. Moderate mitral valve regurgitation.  No evidence of mitral stenosis. Moderate mitral annular calcification.  5. The aortic valve is calcified. Aortic valve regurgitation is trivial.  Mild aortic valve sclerosis is present, with no evidence of aortic valve  stenosis.  6. Aortic dilatation noted. There is mild dilatation of the ascending  aorta, measuring 39 mm.  7. The inferior vena cava is normal in size with greater than 50%  respiratory variability, suggesting right atrial pressure of 3 mmHg.  R/LHC 12/17/20  Prox LAD to Mid LAD lesion is 20% stenosed.  Prox RCA lesion is 25% stenosed.  LV end diastolic pressure is mildly elevated.  Hemodynamic findings consistent with mild pulmonary hypertension.   1. Minimal nonobstructive CAD 2. Mildly elevated LVEDP 3. Mild pulmonary HTN. Mean PAP 22 mm Hg 4. Reduced cardiac output 2.6 L/min with index 1.72  Plan: medical management.   Patient Profile     87 y.o. female with PMH hypertension, hyperlipidemia, COPD, hyponatremia 2/2 HCTZ who is being followed in consultation at the request of Dr. Tyrell Antonio for new diagnosis of acute systolic and diastolic heart failure  Assessment & Plan    Acute systolic and diastolic heart failure, EF 25-30% Nonischemic cardiomyopathy -echo, cath this admission as above -BNP on admission 3235 -on losartan, spironolactone, hydralazine, isosorbide -had intermittently low BP, but appears  to be improving. Will add 12.5 mg metoprolol succinate today, monitor heart rates, but these are also improved from prior -recommend SGLT2i, but with rise in Cr will not start today -Admission weight not charted. Weight on 2/25 53.6 kg, weight this morning 50.6 kg. I/O inaccurate. -Cr up today from 0.88 to 1.21 yesterday to 1.31 today. Holding lasix. BUN up, Cl down as well. Would encourage oral hydration. -K 3.9 today -NICM workup: TSH unremarkable, iron studies unremarable, cortisol unremarkable, A1c 5.8. SPEP pending -strict I/O, daily weights, fluid restriction -may only need PRN lasix at discharge based on weight, especially if able to start SGLT2i  Nonobstructive  CAD Hyperlipidemia -aspirin 81 mg started this admission -on lovastatin as an outpatient, pravastatin as an inpatient -LDL goal <70  COPD -per primary team  Mild thoracic aortic dilation -follow up as outpatient  For questions or updates, please contact [facility] Please consult [email or web address] for contact info under     Signed, Buford Dresser, MD  12/21/2020, 12:28 PM

## 2020-12-21 NOTE — Progress Notes (Signed)
Heart Failure Stewardship Pharmacist Progress Note   PCP: Hulan Fess, MD PCP-Cardiologist: Donato Heinz, MD    HPI:  87 yo F with PMH of HTN, HLD, COPD, squamous cell skin cancer, anxiety/depression, and hypokalemia. She presented to the ED on 12/15/20 with shortness of breath and fluid overload. Admitted for acute combine systolic and diastolic HF. An ECHO was done on 12/16/20 and LVEF is 25-30%. R/LHC done on 12/17/20 and found to have minimal nonobstructive CAD and mildly elevated filling pressures.   Current HF Medications: Losartan 100 mg daily Spironolactone 25 mg daily Hydralazine 10 mg q8h Imdur 30 mg daily  Prior to admission HF Medications: Losartan 50 mg daily  Pertinent Lab Values: . Serum creatinine 1.31, BUN 33, Potassium 3.9, Sodium 136, BNP 3235  Vital Signs: . Weight: 111 lbs (admission weight: 118 lbs) . Blood pressure: 110-150/70s  . Heart rate: 70s   Medication Assistance / Insurance Benefits Check: Does the patient have prescription insurance?  Yes Type of insurance plan: UHC Medicare  Does the patient qualify for medication assistance through manufacturers or grants?   Pending . Eligible grants and/or patient assistance programs: pending . Medication assistance applications in progress: none  . Medication assistance applications approved: none  Approved medication assistance renewals will be completed by: pending  Outpatient Pharmacy:  Prior to admission outpatient pharmacy: [facility] Is the patient willing to use [facility] at discharge? Yes  Assessment: 1. Acute on chronic systolic CHF (EF 18-84%), due to NICM. NYHA class III symptoms. - Consider starting carvedilol 3.125 mg BID or metoprolol XL 12.5 mg daily now off IV diuretics  - On losartan 100 mg daily - consider optimizing to Entresto 24/26 mg BID pending BP trends. - Continue spironolactone 25 mg daily - Consider starting Farxiga 10 mg daily prior to discharge.  Minimal to no impact on BP.  - Continue hydralazine 10 mg q8h  - Continue Imdur 30 mg daily   Plan: 1) Medication changes recommended at this time: - Start low dose carvedilol or metoprolol XL today  2) Patient assistance application(s): - Entresto copay $47 per month - Farxiga copay $47 per month - Can help complete patient assistance applications to lower copay to $0 per month for both prescriptions  3)  Education  - To be completed prior to discharge  Kerby Nora, PharmD, BCPS Heart Failure Cytogeneticist Phone [phone]

## 2020-12-21 NOTE — Progress Notes (Addendum)
PROGRESS NOTE    [name]  VOJ:[id number] DOB: 1934-01-05 DOA: 12/15/2020 PCP: Hulan Fess, MD   Brief Narrative: 87 year old with past medical history significant for hypertension, hyponatremia secondary to hydrochlorothiazide, COPD, anxiety depression who presented with new onset of shortness of breath.  Patient reports her symptoms started 4 to 5 days prior to admission, she initially started with exertional dyspnea, since the day of admission her shortness of breath is constant.  Presents with significantly elevated blood pressure, fluid overload, chest x-ray with bilateral pleural effusion, potassium 2.7. she was treated with IV lasix. Cardiology consulted. Underwent Heart cath: with no significant CAD.  Patient was found to have new reduce Ef 25 %.   Patient creatinine increase to 1.3. lasix on hold. Encourage oral intake. Repeat labs  in am.   Assessment & Plan:   Principal Problem:   Acute combined systolic and diastolic CHF, NYHA class 4 (HCC) Active Problems:   Hypertension   Cardiomyopathy ([place]), type unknown   COPD suggested by initial evaluation ([place])  1-Acute systolic and Diastolic Heart Failure Exacerbation: -Patient presents with Dyspnea. Chest x-ray: With increase opacity could reflect pulmonary edema. BNP; 3235. -Patient received IV lasix during this admission. Cr has been increasing, today at 1.3. plan to continue to hold lasix.   -Weight: 118---115---108---111--111 -ECHO: Ef 25 %, diastolic dysfunction grade one, Mitral Valve regurgitation.  -Cardiology consulted, recommend ischemic evaluation during this admission.  -Cath; No significant CAD>  -Started on Cozaar, Imdur, spironolactone.  -Appreciate cardiology assistance.   Hypokalemia: Started on spironolactone.  Resolved.   Hyperetension: Continue with Cozaar and lasix.  On low dose hydralazine, Imdur.   COPD: Schedule nebulizer.  Increased  Mucinex dose.  Flutter valve.   HLD: Continue  with Pravachol.   Elevated Blood sugar: pre diabetes range. Diet modification.  HbA1c; 5.8  Memory problems; worse over last 6 months.  -B 12 low normal. started supplement.  -needs out patient evaluation by neurology.   Vitamin D deficiency; started supplement.   AKI; likey related to lasix. Hold lasix.  She received a dose of IV lasix 2/28. Continue to hold lasix.  Encourage oral intake. Discuss with nurse.   Vomiting; She had episode while I was in the room. Happen after coughing spells.  IV Protonix. Zofran PRN.  KUB negative for obstruction.   Leukocytosis; suspect related to hemoconcentration.   Estimated body mass index is 21.08 kg/m as calculated from the following:   Height as of this encounter: 5\' 1"  (1.549 m).   Weight as of this encounter: 50.6 kg.   DVT prophylaxis: Lovenox Code Status: Full code Family Communication: update Daughter 3/1 Disposition Plan:  Status is: Inpatient  Remains inpatient appropriate because:IV treatments appropriate due to intensity of illness or inability to take PO   Dispo: The patient is from: Home              Anticipated d/c is to: to be determine Might benefit from SNF              Anticipated d/c date is: 2 days              Patient currently is not medically stable to d/c.   Difficult to place patient No        Consultants:   None  Procedures:   ECHO: Left Ventricle: Left ventricular ejection fraction, by estimation, is 25  to 30%. Left ventricular ejection fraction by 3D volume is 31 %. The left  ventricle has severely  decreased function. The left ventricle demonstrates  global hypokinesis. The left  ventricular internal cavity size was normal in size. There is mild left  ventricular hypertrophy. Left ventricular diastolic parameters are  consistent with Grade I diastolic dysfunction (impaired relaxation).   Antimicrobials:    Subjective: Patient had coughing spell, and the vomited. Dark fluid content.   Denies abdominal pain. unknown when was her last BM.   Objective: Vitals:   12/21/20 0300 12/21/20 0619 12/21/20 0830 12/21/20 1111  BP: (!) 154/87 113/76 133/79 140/73  Pulse: 91  80 87  Resp: 18  20 20   Temp: 98.4 F (36.9 C)  98.6 F (37 C) 98.4 F (36.9 C)  TempSrc: Oral  Oral Oral  SpO2: 95%  95%   Weight: 50.6 kg     Height:        Intake/Output Summary (Last 24 hours) at 12/21/2020 1326 Last data filed at 12/21/2020 0830 Gross per 24 hour  Intake 360 ml  Output --  Net 360 ml   Filed Weights   12/19/20 0500 12/20/20 0500 12/21/20 0300  Weight: 49.2 kg 50.7 kg 50.6 kg    Examination:  General exam: NAD Respiratory system: CTA Cardiovascular system: S 1, S 2 RRR Gastrointestinal system: BS present, soft, nt Central nervous system: Alert Extremities: no edema   Data Reviewed: I have personally reviewed following labs and imaging studies  CBC: Recent Labs  Lab 12/15/20 1514 12/17/20 1452 12/17/20 1455 12/17/20 1820 12/21/20 1143  WBC 9.7  --   --  10.1 16.0*  HGB 15.0 14.6 14.3 14.3 15.9*  HCT 48.0* 43.0 42.0 43.9 46.5*  MCV 98.4  --   --  95.6 91.4  PLT 171  --   --  193 062   Basic Metabolic Panel: Recent Labs  Lab 12/15/20 1514 12/16/20 0423 12/17/20 0009 12/17/20 1452 12/17/20 1455 12/17/20 1820 12/18/20 0055 12/19/20 0057 12/20/20 0035 12/21/20 0049  NA 146*   < > 144   < > 145  --  143 138 135 136  K 2.7*   < > 3.7   < > 4.1  --  3.9 3.7 3.5 3.9  CL 104   < > 104  --   --   --  102 98 95* 93*  CO2 30   < > 30  --   --   --  31 30 31 29   GLUCOSE 147*   < > 101*  --   --   --  89 97 120* 124*  BUN 10   < > 14  --   --   --  19 24* 28* 33*  CREATININE 0.91   < > 0.87  --   --  0.84 0.81 0.88 1.21* 1.31*  CALCIUM 9.2   < > 8.6*  --   --   --  8.9 9.5 9.9 10.2  MG 2.0  --   --   --   --   --   --   --   --   --    < > = values in this interval not displayed.   GFR: Estimated Creatinine Clearance: 24.1 mL/min (A) (by C-G formula based  on SCr of 1.31 mg/dL (H)). Liver Function Tests: No results for input(s): AST, ALT, ALKPHOS, BILITOT, PROT, ALBUMIN in the last 168 hours. No results for input(s): LIPASE, AMYLASE in the last 168 hours. No results for input(s): AMMONIA in the last 168 hours. Coagulation Profile: No results for input(s):  INR, PROTIME in the last 168 hours. Cardiac Enzymes: No results for input(s): CKTOTAL, CKMB, CKMBINDEX, TROPONINI in the last 168 hours. BNP (last 3 results) No results for input(s): PROBNP in the last 8760 hours. HbA1C: No results for input(s): HGBA1C in the last 72 hours. CBG: No results for input(s): GLUCAP in the last 168 hours. Lipid Profile: No results for input(s): CHOL, HDL, LDLCALC, TRIG, CHOLHDL, LDLDIRECT in the last 72 hours. Thyroid Function Tests: No results for input(s): TSH, T4TOTAL, FREET4, T3FREE, THYROIDAB in the last 72 hours. Anemia Panel: Recent Labs    12/19/20 0057  FERRITIN 57   Sepsis Labs: No results for input(s): PROCALCITON, LATICACIDVEN in the last 168 hours.  Recent Results (from the past 240 hour(s))  Resp Panel by RT-PCR (Flu A&B, Covid) Nasopharyngeal Swab     Status: None   Collection Time: 12/15/20  6:13 PM   Specimen: Nasopharyngeal Swab; Nasopharyngeal(NP) swabs in vial transport medium  Result Value Ref Range Status   SARS Coronavirus 2 by RT PCR NEGATIVE NEGATIVE Final    Comment: (NOTE) SARS-CoV-2 target nucleic acids are NOT DETECTED.  The SARS-CoV-2 RNA is generally detectable in upper respiratory specimens during the acute phase of infection. The lowest concentration of SARS-CoV-2 viral copies this assay can detect is 138 copies/mL. A negative result does not preclude SARS-Cov-2 infection and should not be used as the sole basis for treatment or other patient management decisions. A negative result may occur with  improper specimen collection/handling, submission of specimen other than nasopharyngeal swab, presence of viral  mutation(s) within the areas targeted by this assay, and inadequate number of viral copies(<138 copies/mL). A negative result must be combined with clinical observations, patient history, and epidemiological information. The expected result is Negative.  Fact Sheet for Patients:  EntrepreneurPulse.com.au  Fact Sheet for Healthcare Providers:  IncredibleEmployment.be  This test is no t yet approved or cleared by the [place] FDA and  has been authorized for detection and/or diagnosis of SARS-CoV-2 by FDA under an Emergency Use Authorization (EUA). This EUA will remain  in effect (meaning this test can be used) for the duration of the COVID-19 declaration under Section 564(b)(1) of the Act, 21 U.S.C.section 360bbb-3(b)(1), unless the authorization is terminated  or revoked sooner.       Influenza A by PCR NEGATIVE NEGATIVE Final   Influenza B by PCR NEGATIVE NEGATIVE Final    Comment: (NOTE) The Xpert Xpress SARS-CoV-2/FLU/RSV plus assay is intended as an aid in the diagnosis of influenza from Nasopharyngeal swab specimens and should not be used as a sole basis for treatment. Nasal washings and aspirates are unacceptable for Xpert Xpress SARS-CoV-2/FLU/RSV testing.  Fact Sheet for Patients: EntrepreneurPulse.com.au  Fact Sheet for Healthcare Providers: IncredibleEmployment.be  This test is not yet approved or cleared by the [place] FDA and has been authorized for detection and/or diagnosis of SARS-CoV-2 by FDA under an Emergency Use Authorization (EUA). This EUA will remain in effect (meaning this test can be used) for the duration of the COVID-19 declaration under Section 564(b)(1) of the Act, 21 U.S.C. section 360bbb-3(b)(1), unless the authorization is terminated or revoked.  Performed at [facility], [place] [address], [place], [place] [address]   MRSA PCR Screening     Status: None    Collection Time: 12/16/20  3:20 PM   Specimen: Nasal Mucosa; Nasopharyngeal  Result Value Ref Range Status   MRSA by PCR NEGATIVE NEGATIVE Final    Comment:  The GeneXpert MRSA Assay (FDA approved for NASAL specimens only), is one component of a comprehensive MRSA colonization surveillance program. It is not intended to diagnose MRSA infection nor to guide or monitor treatment for MRSA infections. Performed at [facility], [place] [address], [place], [place] [address]          Radiology Studies: DG Abd 1 View  Result Date: 12/21/2020 CLINICAL DATA:  Nausea and vomiting EXAM: ABDOMEN - 1 VIEW COMPARISON:  None. FINDINGS: Scattered large and small bowel gas is noted. No obstructive changes are seen. Changes of prior cholecystectomy and bilateral hip replacements are noted. Degenerative changes of lumbar spine are seen with a scoliosis concave to the right. Aortic calcifications are noted. No free air is seen. IMPRESSION: No acute abnormality noted. Electronically Signed   By: Inez Catalina M.D.   On: 12/21/2020 12:10        Scheduled Meds: . aspirin EC  81 mg Oral QHS  . diclofenac Sodium  2 g Topical QID  . enoxaparin (LOVENOX) injection  30 mg Subcutaneous Q24H  . guaiFENesin  1,200 mg Oral BID  . hydrALAZINE  10 mg Oral Q8H  . isosorbide mononitrate  30 mg Oral Daily  . losartan  100 mg Oral Daily  . pantoprazole  40 mg Oral BID  . pravastatin  40 mg Oral q1800  . sodium chloride flush  3 mL Intravenous Q12H  . spironolactone  25 mg Oral Daily  . venlafaxine XR  75 mg Oral QPC breakfast  . vitamin B-12  100 mcg Oral Daily  . Vitamin D (Ergocalciferol)  50,000 Units Oral Q7 days   Continuous Infusions: . sodium chloride       LOS: 6 days    Time spent: 35 minutes.     Elmarie Shiley, MD [facility]   If 7PM-7AM, please contact night-coverage [email or web address]  12/21/2020, 1:26 PM

## 2020-12-21 NOTE — Progress Notes (Signed)
PHARMACIST - PHYSICIAN COMMUNICATION   CONCERNING: IV to Oral Route Change Policy  RECOMMENDATION: This patient is receiving protonix by the intravenous route.  Based on criteria approved by the Pharmacy and Therapeutics Committee, the intravenous medication(s) is/are being converted to the equivalent oral dose form(s).   DESCRIPTION: These criteria include:  The patient is eating (either orally or via tube) and/or has been taking other orally administered medications for a least 24 hours  The patient has no evidence of active gastrointestinal bleeding or impaired GI absorption (gastrectomy, short bowel, patient on TNA or NPO).  If you have questions about this conversion, please contact the Pharmacy Department  []   [phone] )  [facility] []   [phone] )  [facility] [x]   [phone] )  [facility] []   [phone] )  [facility] []   [phone] )  [place], PharmD Clinical Pharmacist **Pharmacist phone directory can now be found on [email or web address] ([email or web address]).  Listed under [place].

## 2020-12-21 NOTE — Progress Notes (Signed)
Occupational Therapy Treatment Patient Details Name: [name] MRN: [id number] DOB: 1934-07-03 Today's Date: 12/21/2020    History of present illness Pt is an 87 y/o female admitted secondary to SOB and BLE swelling. Thought to be secondary to acute CHF. PMH includes HTN and COPD.   OT comments  Pt progressing towards OT goals this session. Pt able to ambulate to bathroom at min guard with vc for safe hand placement with RW. Pt with BM (RN notified) and able to perform peri care in standing with assist for the rear). Grooming performed in seated position due to decreased activity tolerance. Daughter present at beginning and end of session, she states that family can provide 24 hour assist - which will be essential for safety initially. At this time, [place] and 24 hour supervision is the preferred POC by therapy team AND family, should she develop increased weakness then the patient and family are open to short term rehab. Next session take energy conservation education.   Note: encouraged Pt to ask medical providers (OT included) to clarify when she does not understand what is going on or terminology. Daughter at bedside [name]) is a Immunologist for [employer].   Follow Up Recommendations  Home health OT;Supervision/Assistance - 24 hour (family can provide 24 hour supervision)    Equipment Recommendations  3 in 1 bedside commode    Recommendations for Other Services      Precautions / Restrictions Precautions Precautions: Fall Precaution Comments: watch SpO2       Mobility Bed Mobility Overal bed mobility: Needs Assistance Bed Mobility: Supine to Sit;Sit to Supine     Supine to sit: Supervision;HOB elevated Sit to supine: Min guard   General bed mobility comments: Supervision for safety.    Transfers Overall transfer level: Needs assistance Equipment used: Rolling walker (2 wheeled) Transfers: Sit to/from Stand Sit to Stand: Min guard         General transfer comment: vc for  safe hand placement with RW    Balance Overall balance assessment: Needs assistance Sitting-balance support: Feet supported;No upper extremity supported Sitting balance-Leahy Scale: Good     Standing balance support: Bilateral upper extremity supported;During functional activity Standing balance-Leahy Scale: Poor Standing balance comment: Reliant on BUE support                           ADL either performed or assessed with clinical judgement   ADL Overall ADL's : Needs assistance/impaired     Grooming: Set up;Sitting Grooming Details (indicate cue type and reason): Pt with decreased activity tolerance and not feeling as well today. Required seated grooming activities             Lower Body Dressing: Sit to/from stand;Min guard Lower Body Dressing Details (indicate cue type and reason): able to bend over and don socks Toilet Transfer: Min guard;Ambulation;RW Toilet Transfer Details (indicate cue type and reason): into bathroom, vc for safe hand placement with transfers [place] and Hygiene: Sit to/from stand;Moderate assistance Toileting - Clothing Manipulation Details (indicate cue type and reason): Pt able to perform peri care, but requested assist for rear peri carein standing leaning fwd on RW     Functional mobility during ADLs: Min guard;Rolling walker;Cueing for safety (vc for safe hand placement) General ADL Comments: decreased activity tolerance, desaturation with activity     Vision       Perception     Praxis      Cognition Arousal/Alertness:  Awake/alert Behavior During Therapy: WFL for tasks assessed/performed Overall Cognitive Status: Impaired/Different from baseline Area of Impairment: Safety/judgement;Memory                     Memory: Decreased short-term memory   Safety/Judgement: Decreased awareness of safety              Exercises     Shoulder Instructions       General Comments       Pertinent Vitals/ Pain       Pain Assessment: No/denies pain Pain Intervention(s): Monitored during session;Repositioned  Home Living                                          Prior Functioning/Environment              Frequency  Min 2X/week        Progress Toward Goals  OT Goals(current goals can now be found in the care plan section)  Progress towards OT goals: Progressing toward goals  Acute Rehab OT Goals Patient Stated Goal: to feel better OT Goal Formulation: With patient Time For Goal Achievement: 12/31/20 Potential to Achieve Goals: Good  Plan Discharge plan remains appropriate;Frequency remains appropriate    Co-evaluation                 AM-PAC OT "6 Clicks" Daily Activity     Outcome Measure   Help from another person eating meals?: A Little Help from another person taking care of personal grooming?: A Little Help from another person toileting, which includes using toliet, bedpan, or urinal?: A Lot Help from another person bathing (including washing, rinsing, drying)?: A Little Help from another person to put on and taking off regular upper body clothing?: A Little Help from another person to put on and taking off regular lower body clothing?: A Little 6 Click Score: 17    End of Session Equipment Utilized During Treatment: Gait belt;Rolling walker;Oxygen (2L)  OT Visit Diagnosis: Other abnormalities of gait and mobility (R26.89);Muscle weakness (generalized) (M62.81)   Activity Tolerance Patient tolerated treatment well   Patient Left in bed;with call bell/phone within reach;with bed alarm set;with family/visitor present   Nurse Communication Mobility status;Precautions        Time: 3295-1884 OT Time Calculation (min): 34 min  Charges: OT General Charges $OT Visit: 1 Visit OT Treatments $Self Care/Home Management : 23-37 mins  Jesse Sans OTR/L Acute Rehabilitation Services Pager: [phone] Office:  [place] 12/21/2020, 3:30 PM

## 2020-12-22 DIAGNOSIS — I429 Cardiomyopathy, unspecified: Secondary | ICD-10-CM | POA: Diagnosis not present

## 2020-12-22 DIAGNOSIS — I5041 Acute combined systolic (congestive) and diastolic (congestive) heart failure: Secondary | ICD-10-CM | POA: Diagnosis not present

## 2020-12-22 LAB — POCT I-STAT 7, (LYTES, BLD GAS, ICA,H+H)
Acid-Base Excess: 9 mmol/L — ABNORMAL HIGH (ref 0.0–2.0)
Bicarbonate: 34.7 mmol/L — ABNORMAL HIGH (ref 20.0–28.0)
Calcium, Ion: 1.15 mmol/L (ref 1.15–1.40)
HCT: 43 % (ref 36.0–46.0)
Hemoglobin: 14.6 g/dL (ref 12.0–15.0)
O2 Saturation: 60 %
Potassium: 4.2 mmol/L (ref 3.5–5.1)
Sodium: 145 mmol/L (ref 135–145)
TCO2: 36 mmol/L — ABNORMAL HIGH (ref 22–32)
pCO2 arterial: 51.1 mmHg — ABNORMAL HIGH (ref 32.0–48.0)
pH, Arterial: 7.44 (ref 7.350–7.450)
pO2, Arterial: 31 mmHg — CL (ref 83.0–108.0)

## 2020-12-22 LAB — CBC
HCT: 46.2 % — ABNORMAL HIGH (ref 36.0–46.0)
Hemoglobin: 15.3 g/dL — ABNORMAL HIGH (ref 12.0–15.0)
MCH: 30.8 pg (ref 26.0–34.0)
MCHC: 33.1 g/dL (ref 30.0–36.0)
MCV: 93 fL (ref 80.0–100.0)
Platelets: 166 10*3/uL (ref 150–400)
RBC: 4.97 MIL/uL (ref 3.87–5.11)
RDW: 15.5 % (ref 11.5–15.5)
WBC: 19 10*3/uL — ABNORMAL HIGH (ref 4.0–10.5)
nRBC: 0 % (ref 0.0–0.2)

## 2020-12-22 LAB — BASIC METABOLIC PANEL
Anion gap: 12 (ref 5–15)
BUN: 42 mg/dL — ABNORMAL HIGH (ref 8–23)
CO2: 28 mmol/L (ref 22–32)
Calcium: 10.3 mg/dL (ref 8.9–10.3)
Chloride: 94 mmol/L — ABNORMAL LOW (ref 98–111)
Creatinine, Ser: 1.27 mg/dL — ABNORMAL HIGH (ref 0.44–1.00)
GFR, Estimated: 42 mL/min — ABNORMAL LOW (ref >60)
Glucose, Bld: 166 mg/dL — ABNORMAL HIGH (ref 70–99)
Potassium: 3.4 mmol/L — ABNORMAL LOW (ref 3.5–5.1)
Sodium: 134 mmol/L — ABNORMAL LOW (ref 135–145)

## 2020-12-22 MED ORDER — IPRATROPIUM-ALBUTEROL 0.5-2.5 (3) MG/3ML IN SOLN
RESPIRATORY_TRACT | Status: AC
  Filled 2020-12-22: qty 3

## 2020-12-22 NOTE — Progress Notes (Signed)
Progress Note    [name]  ZOX:[id number] DOB: [birth year]  DOA: 12/15/2020 PCP: Hulan Fess, MD    Brief Narrative:    Medical records reviewed and are as summarized below:  [name] is an 87 y.o. female with past medical history significant for hypertension, hyponatremia secondary to hydrochlorothiazide, COPD, anxiety depression who presented with new onset of shortness of breath.   with significantly elevated blood pressure, fluid overload, chest x-ray with bilateral pleural effusion, potassium 2.7. she was treated with IV lasix. Cardiology consulted. Underwent Heart cath: with no significant CAD.   Patient was found to have new reduce Ef 25 %.  May need SNF placement.  Assessment/Plan:   Principal Problem:   Acute combined systolic and diastolic CHF, NYHA class 4 (HCC) Active Problems:   Hypertension   Cardiomyopathy ([place]), type unknown   COPD suggested by initial evaluation ([place])   Acute systolic and Diastolic Heart Failure Exacerbation: -Patient presents with Dyspnea. Chest x-ray: With increase opacity could reflect pulmonary edema. BNP; 3235. -ECHO: Ef 25 %, diastolic dysfunction grade one, Mitral Valve regurgitation.  -Cardiology consulted -Cath; No significant CAD>  -defer meds to cards   Hypokalemia: -replace  COPD: Schedule nebulizer.  Increased  Mucinex dose.  Flutter valve.   HLD: Continue with Pravachol.   Elevated Blood sugar: pre diabetes range. Diet modification.  HbA1c; 5.8  Memory problems; worse over last 6 months.  -B 12 low normal- replace -needs out patient evaluation by neurology.   Vitamin D deficiency;  -supplement.   AKI; likey related to lasix.   -defer to cardiology  Leukocytosis -unclear, will order diff for AM  -no fevers noted   Family Communication/Anticipated D/C date and plan/Code Status   DVT prophylaxis: Lovenox ordered. Code Status: Full Code.  Disposition Plan: Status is:  Inpatient  Remains inpatient appropriate because:Inpatient level of care appropriate due to severity of illness   Dispo: The patient is from: Home              Anticipated d/c is to: tbd              Patient currently is not medically stable to d/c.  Final med recs per cards, WBC stabilization, SNF?   Difficult to place patient No         Medical Consultants:    cards     Subjective:     Objective:    Vitals:   12/22/20 0447 12/22/20 0800 12/22/20 1100 12/22/20 1200  BP: 104/78 124/86 (!) 113/95   Pulse: 89 86 63   Resp: 18  19   Temp: 98.6 F (37 C) 97.9 F (36.6 C) 97.6 F (36.4 C)   TempSrc: Oral Oral Oral   SpO2: 93% 95% 90% 94%  Weight:      Height:        Intake/Output Summary (Last 24 hours) at 12/22/2020 1249 Last data filed at 12/22/2020 0800 Gross per 24 hour  Intake 480 ml  Output 450 ml  Net 30 ml   Filed Weights   12/19/20 0500 12/20/20 0500 12/21/20 0300  Weight: 49.2 kg 50.7 kg 50.6 kg    Exam:  General: Appearance:    Elderly frail female sitting in chair     Lungs:     Not on O2, respirations unlabored  Heart:    Normal heart rate. Normal rhythm. No murmurs, rubs, or gallops.      Neurologic:   Awake, alert, pleasant    Data  Reviewed:   I have personally reviewed following labs and imaging studies:  Labs: Labs show the following:   Basic Metabolic Panel: Recent Labs  Lab 12/15/20 1514 12/16/20 0423 12/18/20 0055 12/19/20 0057 12/20/20 0035 12/21/20 0049 12/22/20 0835  NA 146*   < > 143 138 135 136 134*  K 2.7*   < > 3.9 3.7 3.5 3.9 3.4*  CL 104   < > 102 98 95* 93* 94*  CO2 30   < > 31 30 31 29 28   GLUCOSE 147*   < > 89 97 120* 124* 166*  BUN 10   < > 19 24* 28* 33* 42*  CREATININE 0.91   < > 0.81 0.88 1.21* 1.31* 1.27*  CALCIUM 9.2   < > 8.9 9.5 9.9 10.2 10.3  MG 2.0  --   --   --   --   --   --    < > = values in this interval not displayed.   GFR Estimated Creatinine Clearance: 24.9 mL/min (A) (by C-G  formula based on SCr of 1.27 mg/dL (H)). Liver Function Tests: No results for input(s): AST, ALT, ALKPHOS, BILITOT, PROT, ALBUMIN in the last 168 hours. No results for input(s): LIPASE, AMYLASE in the last 168 hours. No results for input(s): AMMONIA in the last 168 hours. Coagulation profile No results for input(s): INR, PROTIME in the last 168 hours.  CBC: Recent Labs  Lab 12/15/20 1514 12/17/20 1452 12/17/20 1455 12/17/20 1820 12/21/20 1143 12/22/20 0835  WBC 9.7  --   --  10.1 16.0* 19.0*  HGB 15.0 14.6 14.3 14.3 15.9* 15.3*  HCT 48.0* 43.0 42.0 43.9 46.5* 46.2*  MCV 98.4  --   --  95.6 91.4 93.0  PLT 171  --   --  193 179 166   Cardiac Enzymes: No results for input(s): CKTOTAL, CKMB, CKMBINDEX, TROPONINI in the last 168 hours. BNP (last 3 results) No results for input(s): PROBNP in the last 8760 hours. CBG: No results for input(s): GLUCAP in the last 168 hours. D-Dimer: No results for input(s): DDIMER in the last 72 hours. Hgb A1c: No results for input(s): HGBA1C in the last 72 hours. Lipid Profile: No results for input(s): CHOL, HDL, LDLCALC, TRIG, CHOLHDL, LDLDIRECT in the last 72 hours. Thyroid function studies: No results for input(s): TSH, T4TOTAL, T3FREE, THYROIDAB in the last 72 hours.  Invalid input(s): FREET3 Anemia work up: No results for input(s): VITAMINB12, FOLATE, FERRITIN, TIBC, IRON, RETICCTPCT in the last 72 hours. Sepsis Labs: Recent Labs  Lab 12/15/20 1514 12/17/20 1820 12/21/20 1143 12/22/20 0835  WBC 9.7 10.1 16.0* 19.0*    Microbiology Recent Results (from the past 240 hour(s))  Resp Panel by RT-PCR (Flu A&B, Covid) Nasopharyngeal Swab     Status: None   Collection Time: 12/15/20  6:13 PM   Specimen: Nasopharyngeal Swab; Nasopharyngeal(NP) swabs in vial transport medium  Result Value Ref Range Status   SARS Coronavirus 2 by RT PCR NEGATIVE NEGATIVE Final    Comment: (NOTE) SARS-CoV-2 target nucleic acids are NOT DETECTED.  The  SARS-CoV-2 RNA is generally detectable in upper respiratory specimens during the acute phase of infection. The lowest concentration of SARS-CoV-2 viral copies this assay can detect is 138 copies/mL. A negative result does not preclude SARS-Cov-2 infection and should not be used as the sole basis for treatment or other patient management decisions. A negative result may occur with  improper specimen collection/handling, submission of specimen other than nasopharyngeal swab, presence  of viral mutation(s) within the areas targeted by this assay, and inadequate number of viral copies(<138 copies/mL). A negative result must be combined with clinical observations, patient history, and epidemiological information. The expected result is Negative.  Fact Sheet for Patients:  EntrepreneurPulse.com.au  Fact Sheet for Healthcare Providers:  IncredibleEmployment.be  This test is no t yet approved or cleared by the [place] FDA and  has been authorized for detection and/or diagnosis of SARS-CoV-2 by FDA under an Emergency Use Authorization (EUA). This EUA will remain  in effect (meaning this test can be used) for the duration of the COVID-19 declaration under Section 564(b)(1) of the Act, 21 U.S.C.section 360bbb-3(b)(1), unless the authorization is terminated  or revoked sooner.       Influenza A by PCR NEGATIVE NEGATIVE Final   Influenza B by PCR NEGATIVE NEGATIVE Final    Comment: (NOTE) The Xpert Xpress SARS-CoV-2/FLU/RSV plus assay is intended as an aid in the diagnosis of influenza from Nasopharyngeal swab specimens and should not be used as a sole basis for treatment. Nasal washings and aspirates are unacceptable for Xpert Xpress SARS-CoV-2/FLU/RSV testing.  Fact Sheet for Patients: EntrepreneurPulse.com.au  Fact Sheet for Healthcare Providers: IncredibleEmployment.be  This test is not yet approved or  cleared by the [place] FDA and has been authorized for detection and/or diagnosis of SARS-CoV-2 by FDA under an Emergency Use Authorization (EUA). This EUA will remain in effect (meaning this test can be used) for the duration of the COVID-19 declaration under Section 564(b)(1) of the Act, 21 U.S.C. section 360bbb-3(b)(1), unless the authorization is terminated or revoked.  Performed at [facility], [place] [address], [place], [place] [address]   MRSA PCR Screening     Status: None   Collection Time: 12/16/20  3:20 PM   Specimen: Nasal Mucosa; Nasopharyngeal  Result Value Ref Range Status   MRSA by PCR NEGATIVE NEGATIVE Final    Comment:        The GeneXpert MRSA Assay (FDA approved for NASAL specimens only), is one component of a comprehensive MRSA colonization surveillance program. It is not intended to diagnose MRSA infection nor to guide or monitor treatment for MRSA infections. Performed at [facility], [place] [address], [place], [place] [address]     Procedures and diagnostic studies:  DG Abd 1 View  Result Date: 12/21/2020 CLINICAL DATA:  Nausea and vomiting EXAM: ABDOMEN - 1 VIEW COMPARISON:  None. FINDINGS: Scattered large and small bowel gas is noted. No obstructive changes are seen. Changes of prior cholecystectomy and bilateral hip replacements are noted. Degenerative changes of lumbar spine are seen with a scoliosis concave to the right. Aortic calcifications are noted. No free air is seen. IMPRESSION: No acute abnormality noted. Electronically Signed   By: Inez Catalina M.D.   On: 12/21/2020 12:10    Medications:   . aspirin EC  81 mg Oral QHS  . diclofenac Sodium  2 g Topical QID  . docusate sodium  100 mg Oral BID  . enoxaparin (LOVENOX) injection  30 mg Subcutaneous Q24H  . guaiFENesin  1,200 mg Oral BID  . hydrALAZINE  10 mg Oral Q8H  . ipratropium-albuterol  3 mL Nebulization BID  . isosorbide mononitrate  30 mg Oral Daily  . losartan   100 mg Oral Daily  . pantoprazole  40 mg Oral BID  . pravastatin  40 mg Oral q1800  . sodium chloride flush  3 mL Intravenous Q12H  . spironolactone  25  mg Oral Daily  . venlafaxine XR  75 mg Oral QPC breakfast  . vitamin B-12  100 mcg Oral Daily  . Vitamin D (Ergocalciferol)  50,000 Units Oral Q7 days   Continuous Infusions: . sodium chloride       LOS: 7 days   Geradine Girt  [facility]   How to contact the [facility] Attending or Consulting provider [place] or covering provider during after hours [place], for this patient?  1. Check the care team in [facility] and look for a) attending/consulting TRH provider listed and b) the [facility] team listed 2. Log into [email or web address] and use [place]'s universal password to access. If you do not have the password, please contact the hospital operator. 3. Locate the [facility] provider you are looking for under [facility] and page to a number that you can be directly reached. 4. If you still have difficulty reaching the provider, please page the [facility] (Director on Call) for the Hospitalists listed on amion for assistance.  12/22/2020, 12:49 PM

## 2020-12-22 NOTE — Progress Notes (Signed)
FL2 completed and bed requests sent in hub.

## 2020-12-22 NOTE — Progress Notes (Signed)
Physical Therapy Re-Evaluation and Treatment Patient Details Name: [name] MRN: [id number] DOB: May 25, 1934 Today's Date: 12/22/2020    History of Present Illness Pt is an 87 y/o female admitted secondary to SOB and BLE swelling. Thought to be secondary to acute CHF. PMH includes HTN and COPD.    PT Comments    Pt limited in mobility today by incontinence of stool and unawareness of leaking brief tracking stool down hallway. Pt is min guard for bed mobility and transfers. Pt needs min A for steadying with her RW. Pt will need 24 hour assist at home as she is currently requiring min A for mobility but max A for safety with clean up. This is the second time working with her when her incontinence of stool and unawareness has created a mess in the hallway and down her legs despite having a brief. If she went to SNF and worked on strength and endurance for improved independence she would be able to get back home. PT will continue to follow acutely.   Follow Up Recommendations  SNF;Supervision/Assistance - 24 hour     Equipment Recommendations  None recommended by PT       Precautions / Restrictions Precautions Precautions: Fall Precaution Comments: watch SpO2 Restrictions Weight Bearing Restrictions: No    Mobility  Bed Mobility Overal bed mobility: Needs Assistance Bed Mobility: Supine to Sit;Sit to Supine     Supine to sit: Supervision;HOB elevated Sit to supine: Min guard   General bed mobility comments: Supervision for safety.    Transfers Overall transfer level: Needs assistance Equipment used: Rolling walker (2 wheeled) Transfers: Sit to/from Stand Sit to Stand: Min guard         General transfer comment: vc for safe hand placement with RW for power up to standing from bed and from toilet  Ambulation/Gait Ambulation/Gait assistance: Min assist Gait Distance (Feet): 15 Feet (15' x1, 10"x1) Assistive device: Rolling walker (2 wheeled) Gait Pattern/deviations:  Step-through pattern;Decreased stride length;Shuffle;Trunk flexed Gait velocity: Decreased Gait velocity interpretation: <1.31 ft/sec, indicative of household ambulator General Gait Details: min A for steadying, vc for proximity to RW, and upright posture, pt unaware of incontinence of stool leaving trail in hallway   Stairs             Wheelchair Mobility    Modified Rankin (Stroke Patients Only)       Balance Overall balance assessment: Needs assistance Sitting-balance support: Feet supported;No upper extremity supported Sitting balance-Leahy Scale: Good     Standing balance support: Bilateral upper extremity supported;During functional activity Standing balance-Leahy Scale: Poor Standing balance comment: Reliant on BUE support                            Cognition Arousal/Alertness: Awake/alert Behavior During Therapy: WFL for tasks assessed/performed Overall Cognitive Status: Impaired/Different from baseline Area of Impairment: Safety/judgement;Memory                     Memory: Decreased short-term memory   Safety/Judgement: Decreased awareness of safety     General Comments: pt is unaware when incontinent of stool      Exercises      General Comments General comments (skin integrity, edema, etc.): Able to ambulate on RA with SaO2 >90%O2      Pertinent Vitals/Pain Pain Assessment: No/denies pain           PT Goals (current goals can now be found in the care  plan section) Acute Rehab PT Goals Patient Stated Goal: to feel better PT Goal Formulation: With patient Time For Goal Achievement: 12/30/20 Potential to Achieve Goals: Good Progress towards PT goals: Not progressing toward goals - comment (limited by incontinece of stool)    Frequency    Min 2X/week      PT Plan Discharge plan needs to be updated;Frequency needs to be updated    M-PAC PT "6 Clicks" Mobility   Outcome Measure  Help needed turning from your  back to your side while in a flat bed without using bedrails?: None Help needed moving from lying on your back to sitting on the side of a flat bed without using bedrails?: None Help needed moving to and from a bed to a chair (including a wheelchair)?: A Little Help needed standing up from a chair using your arms (e.g., wheelchair or bedside chair)?: A Little Help needed to walk in hospital room?: A Little Help needed climbing 3-5 steps with a railing? : A Little 6 Click Score: 20    End of Session Equipment Utilized During Treatment: Gait belt Activity Tolerance: Patient tolerated treatment well Patient left: in chair;with call bell/phone within reach;with nursing/sitter in room Nurse Communication: Mobility status PT Visit Diagnosis: Other abnormalities of gait and mobility (R26.89)     Time: 0802-2336 PT Time Calculation (min) (ACUTE ONLY): 33 min  Charges:  $Gait Training: 8-22 mins $Therapeutic Activity: 8-22 mins                     Aamari West B. Migdalia Dk PT, DPT Acute Rehabilitation Services Pager [phone] Office [phone]    [place] 12/22/2020, 11:24 AM

## 2020-12-22 NOTE — Progress Notes (Signed)
Progress Note  Patient Name: [name] Date of Encounter: 12/22/2020  [facility] Cardiologist: Donato Heinz, MD   Subjective   Sitting in chair. Not much appetite. Breathing unchanged.  Inpatient Medications    Scheduled Meds: . aspirin EC  81 mg Oral QHS  . diclofenac Sodium  2 g Topical QID  . docusate sodium  100 mg Oral BID  . enoxaparin (LOVENOX) injection  30 mg Subcutaneous Q24H  . guaiFENesin  1,200 mg Oral BID  . hydrALAZINE  10 mg Oral Q8H  . ipratropium-albuterol  3 mL Nebulization BID  . isosorbide mononitrate  30 mg Oral Daily  . losartan  100 mg Oral Daily  . pantoprazole  40 mg Oral BID  . pravastatin  40 mg Oral q1800  . sodium chloride flush  3 mL Intravenous Q12H  . spironolactone  25 mg Oral Daily  . venlafaxine XR  75 mg Oral QPC breakfast  . vitamin B-12  100 mcg Oral Daily  . Vitamin D (Ergocalciferol)  50,000 Units Oral Q7 days   Continuous Infusions: . sodium chloride     PRN Meds: sodium chloride, acetaminophen, ALPRAZolam, benzonatate, ondansetron (ZOFRAN) IV, sodium chloride flush   Vital Signs    Vitals:   12/21/20 2316 12/22/20 0300 12/22/20 0447 12/22/20 0800  BP: 133/73  104/78 124/86  Pulse: 90  89 86  Resp: 20 20 18    Temp: 98.6 F (37 C)  98.6 F (37 C) 97.9 F (36.6 C)  TempSrc: Oral  Oral Oral  SpO2: 95% 93% 93% 95%  Weight:      Height:        Intake/Output Summary (Last 24 hours) at 12/22/2020 1036 Last data filed at 12/22/2020 0800 Gross per 24 hour  Intake 480 ml  Output 450 ml  Net 30 ml   Last 3 Weights 12/21/2020 12/20/2020 12/19/2020  Weight (lbs) 111 lb 8.8 oz 111 lb 12.4 oz 108 lb 7.5 oz  Weight (kg) 50.6 kg 50.7 kg 49.2 kg      Telemetry    Sinus rhythm with intermittent PVCs, rates 60s-90s - Personally Reviewed  ECG    No new since 2/23- Personally Reviewed  Physical Exam   GEN: Well nourished, well developed in no acute distress NECK: No JVD CARDIAC: regular rhythm, normal S1  and S2, no rubs or gallops. No murmur. VASCULAR: Radial pulses 2+ bilaterally.  RESPIRATORY:  Diminished but no crackles appreciated ABDOMEN: Soft, non-tender, non-distended MUSCULOSKELETAL:  Moves all 4 limbs independently SKIN: Warm and dry, no edema NEUROLOGIC:  No focal neuro deficits noted. PSYCHIATRIC:  Normal affect   Labs    High Sensitivity Troponin:   Recent Labs  Lab 12/15/20 1514  TROPONINIHS 58*      Chemistry Recent Labs  Lab 12/20/20 0035 12/21/20 0049 12/22/20 0835  NA 135 136 134*  K 3.5 3.9 3.4*  CL 95* 93* 94*  CO2 31 29 28   GLUCOSE 120* 124* 166*  BUN 28* 33* 42*  CREATININE 1.21* 1.31* 1.27*  CALCIUM 9.9 10.2 10.3  GFRNONAA 44* 40* 42*  ANIONGAP 9 14 12      Hematology Recent Labs  Lab 12/17/20 1820 12/21/20 1143 12/22/20 0835  WBC 10.1 16.0* 19.0*  RBC 4.59 5.09 4.97  HGB 14.3 15.9* 15.3*  HCT 43.9 46.5* 46.2*  MCV 95.6 91.4 93.0  MCH 31.2 31.2 30.8  MCHC 32.6 34.2 33.1  RDW 16.0* 15.5 15.5  PLT 193 179 166    BNP Recent  Labs  Lab 12/15/20 1514  BNP 3,235.0*     DDimer No results for input(s): DDIMER in the last 168 hours.   Radiology    DG Abd 1 View  Result Date: 12/21/2020 CLINICAL DATA:  Nausea and vomiting EXAM: ABDOMEN - 1 VIEW COMPARISON:  None. FINDINGS: Scattered large and small bowel gas is noted. No obstructive changes are seen. Changes of prior cholecystectomy and bilateral hip replacements are noted. Degenerative changes of lumbar spine are seen with a scoliosis concave to the right. Aortic calcifications are noted. No free air is seen. IMPRESSION: No acute abnormality noted. Electronically Signed   By: Inez Catalina M.D.   On: 12/21/2020 12:10    Cardiac Studies   Echo 12/16/20 1. Left ventricular ejection fraction, by estimation, is 25 to 30%. Left  ventricular ejection fraction by 3D volume is 31 %. The left ventricle has  severely decreased function. The left ventricle demonstrates global  hypokinesis.  There is mild left  ventricular hypertrophy. Left ventricular diastolic parameters are  consistent with Grade I diastolic dysfunction (impaired relaxation).  2. Right ventricular systolic function is normal. The right ventricular  size is normal. There is mildly elevated pulmonary artery systolic  pressure.  3. Left atrial size was severely dilated.  4. The mitral valve is degenerative. Moderate mitral valve regurgitation.  No evidence of mitral stenosis. Moderate mitral annular calcification.  5. The aortic valve is calcified. Aortic valve regurgitation is trivial.  Mild aortic valve sclerosis is present, with no evidence of aortic valve  stenosis.  6. Aortic dilatation noted. There is mild dilatation of the ascending  aorta, measuring 39 mm.  7. The inferior vena cava is normal in size with greater than 50%  respiratory variability, suggesting right atrial pressure of 3 mmHg.  R/LHC 12/17/20  Prox LAD to Mid LAD lesion is 20% stenosed.  Prox RCA lesion is 25% stenosed.  LV end diastolic pressure is mildly elevated.  Hemodynamic findings consistent with mild pulmonary hypertension.   1. Minimal nonobstructive CAD 2. Mildly elevated LVEDP 3. Mild pulmonary HTN. Mean PAP 22 mm Hg 4. Reduced cardiac output 2.6 L/min with index 1.72  Plan: medical management.   Patient Profile     87 y.o. female with PMH hypertension, hyperlipidemia, COPD, hyponatremia 2/2 HCTZ who is being followed in consultation at the request of Dr. Tyrell Antonio for new diagnosis of acute systolic and diastolic heart failure  Assessment & Plan    Acute systolic and diastolic heart failure, EF 25-30% Nonischemic cardiomyopathy -echo, cath this admission as above -BNP on admission 3235 -on losartan, spironolactone, hydralazine, isosorbide -tolerating low dose metoprolol succinate -recommend SGLT2i -Admission weight not charted. Weight on 2/25 53.6 kg, weight this morning 50.6 kg. I/O inaccurate. -Cr  now starting to downtrend, 1.27 today. BUN, Cl remain abnormal. Would encourage oral hydration. -K 3.4 today -NICM workup: TSH unremarkable, iron studies unremarable, cortisol unremarkable, A1c 5.8. SPEP unremarkable -strict I/O, daily weights, fluid restriction -may only need PRN lasix at discharge based on weight, especially if able to start SGLT2i  Nonobstructive CAD Hyperlipidemia -aspirin 81 mg started this admission -on lovastatin as an outpatient, pravastatin as an inpatient -LDL goal <70  COPD -per primary team  Mild thoracic aortic dilation -follow up as outpatient  For questions or updates, please contact [facility] Please consult [email or web address] for contact info under     Signed, Buford Dresser, MD  12/22/2020, 10:36 AM

## 2020-12-22 NOTE — TOC Progression Note (Addendum)
Transition of Care [facility]) - Progression Note    Patient Details  Name: [name] MRN: [id number] Date of Birth: [birth year]  Transition of [facility]) CM/SW Contact  Zenon Mayo, RN Phone Number: 12/22/2020, 12:04 PM  Clinical Narrative:    NCM spoke with patient, asked her about going to SNF, she states, she would like for me to speak with her daughter [name] first.  NCM called lvm for [name] to return call.  NCM received call from [facility], she had questions about [facility] services with private duty help.  NCM informed her that she could still get [facility] services which will only be there for 30 to 40 minutes at the most while she gets private duty which the patient or daughter will be responsible for paying for.  Daughter also then asked about SNF short term, NCM informed her that the first 20 days is 0 copay .  She seemed to be interested in SNF because she says she needs 24 hr care right now.  She states she gives [place] permission to fax information out to [facility] to see what bed offers we will get.  NCM informed Cyrus , [place].        Expected Discharge Plan and Services                                                 Social Determinants of Health (SDOH) Interventions Food Insecurity Interventions: Intervention Not Indicated Financial Strain Interventions: Intervention Not Indicated Housing Interventions: Intervention Not Indicated Physical Activity Interventions: Intervention Not Indicated Transportation Interventions: Intervention Not Indicated Alcohol Brief Interventions/Follow-up: AUDIT Score <7 follow-up not indicated  Readmission Risk Interventions No flowsheet data found.

## 2020-12-22 NOTE — NC FL2 (Signed)
[place] LEVEL OF CARE SCREENING TOOL     IDENTIFICATION  Patient Name: [name] Birthdate: July 01, 1934 Sex: female Admission Date (Current Location): 12/15/2020  [facility] [place] Number:      Facility and Address:  [facility], [place] [address], [place], [place] [address]      Provider Number:    Attending Physician Name and Address:  Geradine Girt, DO  Relative Name and Phone Number:  Aiden, Rao (Daughter)   [phone] [facility] Phone)    Current Level of Care: Hospital Recommended Level of Care: [place] Prior Approval Number:    Date Approved/Denied:   PASRR Number: [id number]  Discharge Plan: SNF    Current Diagnoses: Patient Active Problem List   Diagnosis Date Noted  . Hypertension 12/16/2020  . Cardiomyopathy ([place]), type unknown 12/16/2020  . COPD suggested by initial evaluation ([place]) 12/16/2020  . Acute combined systolic and diastolic CHF, NYHA class 4 ([place])   . Acute CHF (congestive heart failure) ([place]) 12/15/2020  . CHF (congestive heart failure) ([place]) 12/15/2020  . Hypokalemia   . Chronic bronchitis with productive mucopurulent cough ([place]) 04/16/2018  . Bradycardia 04/16/2018  . Solitary pulmonary nodule on lung CT 04/16/2018  . Cigarette smoker 04/16/2018  . Osteoporosis, unspecified 04/02/2014  . Squamous cell carcinoma of anal margin 03/23/2014  . Squamous cell carcinoma of skin 03/04/2014  . Perianal mass 01/28/2014  . Osteoarthritis of right knee 12/13/2011    Orientation RESPIRATION BLADDER Height & Weight     Self,Time,Place  O2 ([place] 1L) Incontinent,External catheter Weight: 111 lb 8.8 oz (50.6 kg) Height:  5\' 1"  (154.9 cm)  BEHAVIORAL SYMPTOMS/MOOD NEUROLOGICAL BOWEL NUTRITION STATUS      Continent Diet (see d/c summary)  AMBULATORY STATUS COMMUNICATION OF NEEDS Skin   Extensive Assist Verbally Normal                       Personal Care Assistance Level of  Assistance  Bathing,Feeding,Dressing Bathing Assistance: Limited assistance Feeding assistance: Independent Dressing Assistance: Limited assistance     Functional Limitations Info  Sight,Hearing,Speech Sight Info: Impaired Hearing Info: Adequate Speech Info: Adequate    SPECIAL CARE FACTORS FREQUENCY  PT (By licensed PT),OT (By licensed OT)     PT Frequency: 5x/week OT Frequency: 5x/week            Contractures Contractures Info: Not present    Additional Factors Info  Code Status,Allergies Code Status Info: FULL Allergies Info: Prolia (denosumab)           Current Medications (12/22/2020):  This is the current hospital active medication list Current Facility-Administered Medications  Medication Dose Route Frequency Provider Last Rate Last Admin  . 0.9 %  sodium chloride infusion  250 mL Intravenous PRN [place], Peter M, MD      . acetaminophen (TYLENOL) tablet 650 mg  650 mg Oral Q4H PRN [place], Peter M, MD   650 mg at 12/21/20 0019  . ALPRAZolam Duanne Moron) tablet 0.25 mg  0.25 mg Oral BID PRN [place], Peter M, MD   0.25 mg at 12/18/20 2142  . aspirin EC tablet 81 mg  81 mg Oral QHS [place]ique, Peter M, MD   81 mg at 12/21/20 2120  . benzonatate (TESSALON) capsule 100 mg  100 mg Oral BID PRN Regalado, Belkys A, MD      . diclofenac Sodium (VOLTAREN) 1 % topical gel 2 g  2 g Topical QID Regalado, Belkys A,  MD   2 g at 12/22/20 1401  . docusate sodium (COLACE) capsule 100 mg  100 mg Oral BID Regalado, Belkys A, MD   100 mg at 12/22/20 0935  . enoxaparin (LOVENOX) injection 30 mg  30 mg Subcutaneous Q24H Kris Mouton, RPH   30 mg at 12/22/20 2248  . guaiFENesin (MUCINEX) 12 hr tablet 1,200 mg  1,200 mg Oral BID Regalado, Belkys A, MD   1,200 mg at 12/22/20 0935  . hydrALAZINE (APRESOLINE) tablet 10 mg  10 mg Oral Q8H [place]ique, Peter M, MD   10 mg at 12/22/20 1402  . ipratropium-albuterol (DUONEB) 0.5-2.5 (3) MG/3ML nebulizer solution 3 mL  3 mL Nebulization BID Regalado, Belkys A,  MD   3 mL at 12/22/20 0729  . isosorbide mononitrate (IMDUR) 24 hr tablet 30 mg  30 mg Oral Daily [place], Peter M, MD   30 mg at 12/22/20 0935  . losartan (COZAAR) tablet 100 mg  100 mg Oral Daily [place]ique, Peter M, MD   100 mg at 12/22/20 0935  . ondansetron (ZOFRAN) injection 4 mg  4 mg Intravenous Q6H PRN [place], Peter M, MD   4 mg at 12/21/20 1026  . pantoprazole (PROTONIX) EC tablet 40 mg  40 mg Oral BID Kris Mouton, RPH   40 mg at 12/22/20 0935  . pravastatin (PRAVACHOL) tablet 40 mg  40 mg Oral q1800 [place], Peter M, MD   40 mg at 12/20/20 1844  . sodium chloride flush (NS) 0.9 % injection 3 mL  3 mL Intravenous Q12H [place], Peter M, MD   3 mL at 12/22/20 0934  . sodium chloride flush (NS) 0.9 % injection 3 mL  3 mL Intravenous PRN [place], Peter M, MD      . spironolactone (ALDACTONE) tablet 25 mg  25 mg Oral Daily [place], Peter M, MD   25 mg at 12/22/20 0935  . venlafaxine XR (EFFEXOR-XR) 24 hr capsule 75 mg  75 mg Oral QPC breakfast [place]ique, Peter M, MD   75 mg at 12/22/20 2500  . vitamin B-12 (CYANOCOBALAMIN) tablet 100 mcg  100 mcg Oral Daily Regalado, Belkys A, MD   100 mcg at 12/22/20 0934  . Vitamin D (Ergocalciferol) (DRISDOL) capsule 50,000 Units  50,000 Units Oral Q7 days Niel Hummer A, MD   50,000 Units at 12/18/20 3704     Discharge Medications: Please see discharge summary for a list of discharge medications.  Relevant Imaging Results:  Relevant Lab Results:   Additional Information SSN [id number] Pt is vaccinated x2  [place], LCSW

## 2020-12-23 DIAGNOSIS — I959 Hypotension, unspecified: Secondary | ICD-10-CM

## 2020-12-23 DIAGNOSIS — I429 Cardiomyopathy, unspecified: Secondary | ICD-10-CM | POA: Diagnosis not present

## 2020-12-23 DIAGNOSIS — N179 Acute kidney failure, unspecified: Secondary | ICD-10-CM | POA: Diagnosis not present

## 2020-12-23 DIAGNOSIS — E876 Hypokalemia: Secondary | ICD-10-CM | POA: Diagnosis not present

## 2020-12-23 DIAGNOSIS — I5041 Acute combined systolic (congestive) and diastolic (congestive) heart failure: Secondary | ICD-10-CM | POA: Diagnosis not present

## 2020-12-23 LAB — CBC WITH DIFFERENTIAL/PLATELET
Abs Immature Granulocytes: 0 10*3/uL (ref 0.00–0.07)
Basophils Absolute: 0 10*3/uL (ref 0.0–0.1)
Basophils Relative: 0 %
Eosinophils Absolute: 0 10*3/uL (ref 0.0–0.5)
Eosinophils Relative: 0 %
HCT: 42.2 % (ref 36.0–46.0)
Hemoglobin: 14.5 g/dL (ref 12.0–15.0)
Lymphocytes Relative: 4 %
Lymphs Abs: 0.5 10*3/uL — ABNORMAL LOW (ref 0.7–4.0)
MCH: 31.5 pg (ref 26.0–34.0)
MCHC: 34.4 g/dL (ref 30.0–36.0)
MCV: 91.5 fL (ref 80.0–100.0)
Monocytes Absolute: 0.8 10*3/uL (ref 0.1–1.0)
Monocytes Relative: 6 %
Neutro Abs: 11.8 10*3/uL — ABNORMAL HIGH (ref 1.7–7.7)
Neutrophils Relative %: 90 %
Platelets: 153 10*3/uL (ref 150–400)
RBC: 4.61 MIL/uL (ref 3.87–5.11)
RDW: 15.8 % — ABNORMAL HIGH (ref 11.5–15.5)
WBC: 13.1 10*3/uL — ABNORMAL HIGH (ref 4.0–10.5)
nRBC: 0 % (ref 0.0–0.2)
nRBC: 1 /100 WBC — ABNORMAL HIGH

## 2020-12-23 LAB — BASIC METABOLIC PANEL
Anion gap: 13 (ref 5–15)
BUN: 48 mg/dL — ABNORMAL HIGH (ref 8–23)
CO2: 26 mmol/L (ref 22–32)
Calcium: 10.2 mg/dL (ref 8.9–10.3)
Chloride: 93 mmol/L — ABNORMAL LOW (ref 98–111)
Creatinine, Ser: 1.79 mg/dL — ABNORMAL HIGH (ref 0.44–1.00)
GFR, Estimated: 28 mL/min — ABNORMAL LOW (ref >60)
Glucose, Bld: 115 mg/dL — ABNORMAL HIGH (ref 70–99)
Potassium: 3.4 mmol/L — ABNORMAL LOW (ref 3.5–5.1)
Sodium: 132 mmol/L — ABNORMAL LOW (ref 135–145)

## 2020-12-23 LAB — ALDOSTERONE + RENIN ACTIVITY W/ RATIO
ALDO / PRA Ratio: 3.8 (ref 0.0–30.0)
Aldosterone: 1.8 ng/dL (ref 0.0–30.0)
PRA LC/MS/MS: 0.469 ng/mL/hr (ref 0.167–5.380)

## 2020-12-23 MED ORDER — SODIUM CHLORIDE 0.9 % IV SOLN
INTRAVENOUS | Status: AC
  Filled 2020-12-23: qty 1000

## 2020-12-23 NOTE — Progress Notes (Signed)
PROGRESS NOTE    [name]  LNL:[id number] DOB: 1934/07/13 DOA: 12/15/2020 PCP: Hulan Fess, MD   Chief Complaint  Patient presents with  . Shortness of Breath   Brief Narrative: 87 year old female with hypertension hyponatremia from HCTZ, COPD, anxiety/depression presents with new onset of shortness of breath with significantly elevated blood pressure found to have fluid overload chest x-ray with bilateral pleural effusion, hypokalemia, cardiologist consulted management IV Lasix underwent cardiac cath with no significant CAD but an EF of 25%. Patient is debilitated decondition PT OT working and will need a skilled nursing facility. Patient has been having uptrending renal failure.  Subjective: Seen and examined this morning. Has no new complaints.  Blood pressure has been soft Overnight creatinine bumped up blood pressure has been in 90s this morning  Assessment & Plan:  Acute combined systolic and diastolic CHF, NYHA class 4, LVEF 25 to 30% Nonischemic cardiomyopathy-status post cardiac cath this admission Extensive cardiac work-up with cath echo this admission.  BNP 3234 on admission has been optimized.  Losartan Aldactone hydralazine Imdur and metoprolol succinate.  Given uptrending creatinine and soft blood pressure, may need to optimize modify her medical therapy further-defer to cards.  Asked the nursing staff to hold losarta for blood pressure less than 100 Net -8 L below Net IO Since Admission: -8,030 mL [12/23/20 0823]  Nonobstructive CAD/hyperlipidemia: Continue ASA 81 mg started this admission, resume lovastatin as outpatient LDL goal less than 70  Mild thoracic aortic dilation, currently follow-up as outpatient  AKI in the setting of CHF needing IV Lasix also with blood pressures soft:Since creatinine uptrending will likely need to hold Lasix and cardiology input.  Patient does not appear dry-is euvolemic Recent Labs  Lab 12/19/20 0057 12/20/20 0035  12/21/20 0049 12/22/20 0835 12/23/20 0214  BUN 24* 28* 33* 42* 48*  CREATININE 0.88 1.21* 1.31* 1.27* 1.79*   Hypokalemia monitor and replete Recent Labs  Lab 12/19/20 0057 12/20/20 0035 12/21/20 0049 12/22/20 0835 12/23/20 0214  K 3.7 3.5 3.9 3.4* 3.4*   Hypertension: Blood pressure stable soft this morning.  On multiple cardiac meds and will need to adjust  COPD continue nebulizer Mucinex flutter valve   Hyperglycemia/prediabetes l A1c 5.8, diet modification advised  Vitamin D deficiency continue supplementation  Leukocytosis unclear etiology, it is downtrending now.  Monitor Recent Labs  Lab 12/17/20 1820 12/21/20 1143 12/22/20 0835 12/23/20 0214  WBC 10.1 16.0* 19.0* 13.1*   Memory problems over last 6 months worse, B12 low normal continue to replete outpatient neurology follow-up will be needed.  Deconditioning/debility will continue to work with PT OT and looking into skilled [facility] service appropriate? Yes; Fluid consistency: Thin  Diet effective now                Patient's Body mass index is 20.29 kg/m.  DVT prophylaxis: enoxaparin (LOVENOX) injection 30 mg Start: 12/21/20 0800 Code Status:   Code Status: Full Code Family Communication: plan of care discussed with patient at bedside. Discussed with nursing staff. Status is: Inpatient Remains inpatient appropriate because:Inpatient level of care appropriate due to severity of illness   Dispo: The patient is from: Home              Anticipated d/c is to: SNF once creatinine and BP stabilizes in the next 1 to 2 days  Patient currently is not medically stable to d/c.   Difficult to place patient No    Unresulted Labs (From admission, onward)          Start     Ordered   12/24/20 0500  Creatinine, serum  (enoxaparin (LOVENOX)    CrCl >/= 30 ml/min)  Weekly,   R     Comments: while on enoxaparin therapy   Question:  Specimen collection  method  Answer:  Lab=Lab collect   12/17/20 1528   12/16/20 1657  Aldosterone + renin activity w/ ratio  Once,   R        12/16/20 1656          Medications reviewed:  Scheduled Meds: . aspirin EC  81 mg Oral QHS  . diclofenac Sodium  2 g Topical QID  . docusate sodium  100 mg Oral BID  . enoxaparin (LOVENOX) injection  30 mg Subcutaneous Q24H  . guaiFENesin  1,200 mg Oral BID  . hydrALAZINE  10 mg Oral Q8H  . ipratropium-albuterol  3 mL Nebulization BID  . isosorbide mononitrate  30 mg Oral Daily  . losartan  100 mg Oral Daily  . pantoprazole  40 mg Oral BID  . pravastatin  40 mg Oral q1800  . sodium chloride flush  3 mL Intravenous Q12H  . spironolactone  25 mg Oral Daily  . venlafaxine XR  75 mg Oral QPC breakfast  . vitamin B-12  100 mcg Oral Daily  . Vitamin D (Ergocalciferol)  50,000 Units Oral Q7 days   Continuous Infusions: . sodium chloride      Consultants:see note  Procedures:see note  Antimicrobials: Anti-infectives (From admission, onward)   None     Culture/Microbiology    Component Value Date/Time   SDES ABSCESS 03/05/2013 2100   SDES ABSCESS 03/05/2013 2100   [place] LEFT SEPTIC OLECRANONAAAAA 03/05/2013 2100   [place] LEFT SEPTIC OLECRANON 03/05/2013 2100   CULT  03/05/2013 2100    ABUNDANT STAPHYLOCOCCUS AUREUS Note: RIFAMPIN AND GENTAMICIN SHOULD NOT BE USED AS SINGLE DRUGS FOR TREATMENT OF STAPH INFECTIONS.   CULT NO ANAEROBES ISOLATED 03/05/2013 2100   REPTSTATUS 03/08/2013 FINAL 03/05/2013 2100   REPTSTATUS 03/10/2013 FINAL 03/05/2013 2100    Other culture-see note  Objective: Vitals: Today's Vitals   12/22/20 1611 12/22/20 2100 12/23/20 0000 12/23/20 0617  BP: (!) 115/58 122/66 (!) 99/53 (!) 94/54  Pulse: 61   69  Resp: 20 20 18 17   Temp: 97.8 F (36.6 C) 98.9 F (37.2 C) 98.6 F (37 C) 98.1 F (36.7 C)  TempSrc: Oral Oral Oral Oral  SpO2:    96%  Weight:    48.7 kg  Height:      PainSc: 0-No pain 0-No pain 0-No  pain     Intake/Output Summary (Last 24 hours) at 12/23/2020 0819 Last data filed at 12/22/2020 1200 Gross per 24 hour  Intake 240 ml  Output --  Net 240 ml   Filed Weights   12/20/20 0500 12/21/20 0300 12/23/20 0617  Weight: 50.7 kg 50.6 kg 48.7 kg   Weight change:   Intake/Output from previous day: 03/02 0701 - 03/03 0700 In: 480 [P.O.:480] Out: -  Intake/Output this shift: No intake/output data recorded. Filed Weights   12/20/20 0500 12/21/20 0300 12/23/20 0617  Weight: 50.7 kg 50.6 kg 48.7 kg    Examination: General exam: AAO, elderly frail,NAD, weak appearing. HEENT:Oral mucosa moist, Ear/Nose WNL grossly,dentition normal. Respiratory system: bilaterally diminished,no use of accessory muscle,  non tender. Cardiovascular system: S1 & S2 +, regular, No JVD. Gastrointestinal system: Abdomen soft, NT,ND, BS+. Nervous System:Alert, awake, moving extremities and grossly nonfocal Extremities: No edema, distal peripheral pulses palpable.  Skin: No rashes,no icterus. MSK: Normal muscle bulk,tone, power  Data Reviewed: I have personally reviewed following labs and imaging studies CBC: Recent Labs  Lab 12/17/20 1455 12/17/20 1820 12/21/20 1143 12/22/20 0835 12/23/20 0214  WBC  --  10.1 16.0* 19.0* 13.1*  NEUTROABS  --   --   --   --  11.8*  HGB 14.3 14.3 15.9* 15.3* 14.5  HCT 42.0 43.9 46.5* 46.2* 42.2  MCV  --  95.6 91.4 93.0 91.5  PLT  --  193 179 166 408   Basic Metabolic Panel: Recent Labs  Lab 12/19/20 0057 12/20/20 0035 12/21/20 0049 12/22/20 0835 12/23/20 0214  NA 138 135 136 134* 132*  K 3.7 3.5 3.9 3.4* 3.4*  CL 98 95* 93* 94* 93*  CO2 30 31 29 28 26   GLUCOSE 97 120* 124* 166* 115*  BUN 24* 28* 33* 42* 48*  CREATININE 0.88 1.21* 1.31* 1.27* 1.79*  CALCIUM 9.5 9.9 10.2 10.3 10.2   GFR: Estimated Creatinine Clearance: 17.7 mL/min (A) (by C-G formula based on SCr of 1.79 mg/dL (H)). Liver Function Tests: No results for input(s): AST, ALT, ALKPHOS,  BILITOT, PROT, ALBUMIN in the last 168 hours. No results for input(s): LIPASE, AMYLASE in the last 168 hours. No results for input(s): AMMONIA in the last 168 hours. Coagulation Profile: No results for input(s): INR, PROTIME in the last 168 hours. Cardiac Enzymes: No results for input(s): CKTOTAL, CKMB, CKMBINDEX, TROPONINI in the last 168 hours. BNP (last 3 results) No results for input(s): PROBNP in the last 8760 hours. HbA1C: No results for input(s): HGBA1C in the last 72 hours. CBG: No results for input(s): GLUCAP in the last 168 hours. Lipid Profile: No results for input(s): CHOL, HDL, LDLCALC, TRIG, CHOLHDL, LDLDIRECT in the last 72 hours. Thyroid Function Tests: No results for input(s): TSH, T4TOTAL, FREET4, T3FREE, THYROIDAB in the last 72 hours. Anemia Panel: No results for input(s): VITAMINB12, FOLATE, FERRITIN, TIBC, IRON, RETICCTPCT in the last 72 hours. Sepsis Labs: No results for input(s): PROCALCITON, LATICACIDVEN in the last 168 hours.  Recent Results (from the past 240 hour(s))  Resp Panel by RT-PCR (Flu A&B, Covid) Nasopharyngeal Swab     Status: None   Collection Time: 12/15/20  6:13 PM   Specimen: Nasopharyngeal Swab; Nasopharyngeal(NP) swabs in vial transport medium  Result Value Ref Range Status   SARS Coronavirus 2 by RT PCR NEGATIVE NEGATIVE Final    Comment: (NOTE) SARS-CoV-2 target nucleic acids are NOT DETECTED.  The SARS-CoV-2 RNA is generally detectable in upper respiratory specimens during the acute phase of infection. The lowest concentration of SARS-CoV-2 viral copies this assay can detect is 138 copies/mL. A negative result does not preclude SARS-Cov-2 infection and should not be used as the sole basis for treatment or other patient management decisions. A negative result may occur with  improper specimen collection/handling, submission of specimen other than nasopharyngeal swab, presence of viral mutation(s) within the areas targeted by this  assay, and inadequate number of viral copies(<138 copies/mL). A negative result must be combined with clinical observations, patient history, and epidemiological information. The expected result is Negative.  Fact Sheet for Patients:  EntrepreneurPulse.com.au  Fact Sheet for Healthcare Providers:  IncredibleEmployment.be  This test is no t yet approved or cleared by the [place] and  has been authorized for detection and/or diagnosis of SARS-CoV-2 by FDA under an Emergency Use Authorization (EUA). This EUA will remain  in effect (meaning this test can be used) for the duration of the COVID-19 declaration under Section 564(b)(1) of the Act, 21 U.S.C.section 360bbb-3(b)(1), unless the authorization is terminated  or revoked sooner.       Influenza A by PCR NEGATIVE NEGATIVE Final   Influenza B by PCR NEGATIVE NEGATIVE Final    Comment: (NOTE) The Xpert Xpress SARS-CoV-2/FLU/RSV plus assay is intended as an aid in the diagnosis of influenza from Nasopharyngeal swab specimens and should not be used as a sole basis for treatment. Nasal washings and aspirates are unacceptable for Xpert Xpress SARS-CoV-2/FLU/RSV testing.  Fact Sheet for Patients: EntrepreneurPulse.com.au  Fact Sheet for Healthcare Providers: IncredibleEmployment.be  This test is not yet approved or cleared by the [place] FDA and has been authorized for detection and/or diagnosis of SARS-CoV-2 by FDA under an Emergency Use Authorization (EUA). This EUA will remain in effect (meaning this test can be used) for the duration of the COVID-19 declaration under Section 564(b)(1) of the Act, 21 U.S.C. section 360bbb-3(b)(1), unless the authorization is terminated or revoked.  Performed at [facility], [place] [address] [place], [place] [address]   MRSA PCR Screening     Status: None   Collection Time: 12/16/20  3:20 PM    Specimen: Nasal Mucosa; Nasopharyngeal  Result Value Ref Range Status   MRSA by PCR NEGATIVE NEGATIVE Final    Comment:        The GeneXpert MRSA Assay (FDA approved for NASAL specimens only), is one component of a comprehensive MRSA colonization surveillance program. It is not intended to diagnose MRSA infection nor to guide or monitor treatment for MRSA infections. Performed at [facility], [place] [address] [place], [place] [address]      Radiology Studies: DG Abd 1 View  Result Date: 12/21/2020 CLINICAL DATA:  Nausea and vomiting EXAM: ABDOMEN - 1 VIEW COMPARISON:  None. FINDINGS: Scattered large and small bowel gas is noted. No obstructive changes are seen. Changes of prior cholecystectomy and bilateral hip replacements are noted. Degenerative changes of lumbar spine are seen with a scoliosis concave to the right. Aortic calcifications are noted. No free air is seen. IMPRESSION: No acute abnormality noted. Electronically Signed   By: Inez Catalina M.D.   On: 12/21/2020 12:10     LOS: 8 days   Antonieta Pert, MD [facility]  12/23/2020, 8:19 AM

## 2020-12-23 NOTE — Progress Notes (Signed)
Heart Failure Stewardship Pharmacist Progress Note   PCP: Hulan Fess, MD PCP-Cardiologist: Donato Heinz, MD    HPI:  87 yo F with PMH of HTN, HLD, COPD, squamous cell skin cancer, anxiety/depression, and hypokalemia. She presented to the ED on 12/15/20 with shortness of breath and fluid overload. Admitted for acute combine systolic and diastolic HF. An ECHO was done on 12/16/20 and LVEF is 25-30%. R/LHC done on 12/17/20 and found to have minimal nonobstructive CAD and mildly elevated filling pressures.   Current HF Medications: Hydralazine 10 mg q8h Imdur 30 mg daily  Prior to admission HF Medications: Losartan 50 mg daily  Pertinent Lab Values: . Serum creatinine 1.27>1.79, BUN 48, Potassium 3.4, Sodium 132, BNP 3235  Vital Signs: . Weight: 107 lbs (admission weight: 118 lbs) . Blood pressure: 80-90/50s . Heart rate: 60s   Medication Assistance / Insurance Benefits Check: Does the patient have prescription insurance?  Yes Type of insurance plan: UHC Medicare  Does the patient qualify for medication assistance through manufacturers or grants?   Pending . Eligible grants and/or patient assistance programs: pending . Medication assistance applications in progress: none  . Medication assistance applications approved: none  Approved medication assistance renewals will be completed by: pending  Outpatient Pharmacy:  Prior to admission outpatient pharmacy: [facility] Is the patient willing to use [facility] at discharge? Yes  Assessment: 1. Acute on chronic systolic CHF (EF 32-20%), due to NICM. NYHA class III symptoms. - Off metoprolol XL, losartan, and spironolactone with bump in SCr and low BP - Consider starting Farxiga 10 mg daily prior to discharge if SCr allows. Minimal to no impact on BP.  - Continue hydralazine 10 mg q8h  - Continue Imdur 30 mg daily   Plan: 1) Medication changes recommended at this time: - Continue current regimen  2)  Patient assistance application(s): - Entresto copay $47 per month - Farxiga copay $47 per month - Can help complete patient assistance applications to lower copay to $0 per month for both prescriptions  3)  Education  - To be completed prior to discharge  Kerby Nora, PharmD, BCPS Heart Failure Stewardship Pharmacist Phone [phone]

## 2020-12-23 NOTE — Progress Notes (Signed)
Physical Therapy Treatment Patient Details Name: [name] MRN: [id number] DOB: 11/25/1933 Today's Date: 12/23/2020    History of Present Illness Pt is an 87 y/o female admitted secondary to SOB and BLE swelling. Thought to be secondary to acute CHF. PMH includes HTN and COPD.    PT Comments    Pt was limited during session by discomfort. Pt attempted exercises once refusing OOB activity. Pt unable to tolerate exercises. Pt states she feels like she needs to cough. Pt continues to be limited due to weakness, balance, endurance and safety and will benefit from skilled PT to address to maximize independence    Follow Up Recommendations  SNF;Supervision/Assistance - 24 hour     Equipment Recommendations  None recommended by PT    Recommendations for Other Services       Precautions / Restrictions Precautions Precautions: Fall Precaution Comments: watch SpO2 Restrictions Weight Bearing Restrictions: No    Mobility  Bed Mobility               General bed mobility comments: pt able to assist with scooting up in the bed with therapist assisting wtih pad on bed    Transfers                    Ambulation/Gait                 Stairs             Wheelchair Mobility    Modified Rankin (Stroke Patients Only)       Balance                                            Cognition                                              Exercises General Exercises - Lower Extremity Heel Slides: AROM;Both;10 reps;Supine    General Comments General comments (skin integrity, edema, etc.): pt was very lethargic and uncomfortable. Pt stating she felt like she had Phlem in her throat and needed to cough it out.      Pertinent Vitals/Pain      Home Living                      Prior Function            PT Goals (current goals can now be found in the care plan section) Acute Rehab PT Goals Patient Stated  Goal: to feel better PT Goal Formulation: With patient Time For Goal Achievement: 12/30/20 Potential to Achieve Goals: Good Progress towards PT goals: Not progressing toward goals - comment (pt with increased discomfort limiting participation in session)    Frequency    Min 3X/week      PT Plan Current plan remains appropriate    Co-evaluation              AM-PAC PT "6 Clicks" Mobility   Outcome Measure  Help needed turning from your back to your side while in a flat bed without using bedrails?: None Help needed moving from lying on your back to sitting on the side of a flat bed without using bedrails?: None Help needed moving to and from a  bed to a chair (including a wheelchair)?: A Little Help needed standing up from a chair using your arms (e.g., wheelchair or bedside chair)?: A Little Help needed to walk in hospital room?: A Little Help needed climbing 3-5 steps with a railing? : A Little 6 Click Score: 20    End of Session Equipment Utilized During Treatment: Oxygen Activity Tolerance: Patient limited by lethargy Patient left: in chair;with bed alarm set;with call bell/phone within reach Nurse Communication: Mobility status PT Visit Diagnosis: Other abnormalities of gait and mobility (R26.89);Muscle weakness (generalized) (M62.81)     Time: 1828-8337 PT Time Calculation (min) (ACUTE ONLY): 14 min  Charges:  $Therapeutic Activity: 8-22 mins                     Lyanne Co, DPT Acute Rehabilitation Services [phone]   [place] 12/23/2020, 2:11 PM

## 2020-12-23 NOTE — Progress Notes (Signed)
Progress Note  Patient Name: [name] Date of Encounter: 12/23/2020  [facility] Cardiologist: Donato Heinz, MD   Subjective   No energy, no appetite. Hasn't been eating or drinking much at all. No chest pain or shortness of breath.  Inpatient Medications    Scheduled Meds: . aspirin EC  81 mg Oral QHS  . diclofenac Sodium  2 g Topical QID  . docusate sodium  100 mg Oral BID  . enoxaparin (LOVENOX) injection  30 mg Subcutaneous Q24H  . guaiFENesin  1,200 mg Oral BID  . hydrALAZINE  10 mg Oral Q8H  . ipratropium-albuterol  3 mL Nebulization BID  . isosorbide mononitrate  30 mg Oral Daily  . pantoprazole  40 mg Oral BID  . pravastatin  40 mg Oral q1800  . sodium chloride flush  3 mL Intravenous Q12H  . venlafaxine XR  75 mg Oral QPC breakfast  . vitamin B-12  100 mcg Oral Daily  . Vitamin D (Ergocalciferol)  50,000 Units Oral Q7 days   Continuous Infusions: . sodium chloride     PRN Meds: sodium chloride, acetaminophen, ALPRAZolam, benzonatate, ondansetron (ZOFRAN) IV, sodium chloride flush   Vital Signs    Vitals:   12/22/20 2100 12/23/20 0000 12/23/20 0617 12/23/20 0720  BP: 122/66 (!) 99/53 (!) 94/54 (!) 91/53  Pulse:   69 84  Resp: 20 18 17 20   Temp: 98.9 F (37.2 C) 98.6 F (37 C) 98.1 F (36.7 C)   TempSrc: Oral Oral Oral   SpO2:   96% 98%  Weight:   48.7 kg   Height:        Intake/Output Summary (Last 24 hours) at 12/23/2020 1114 Last data filed at 12/23/2020 0906 Gross per 24 hour  Intake 243 ml  Output --  Net 243 ml   Last 3 Weights 12/23/2020 12/21/2020 12/20/2020  Weight (lbs) 107 lb 5.8 oz 111 lb 8.8 oz 111 lb 12.4 oz  Weight (kg) 48.7 kg 50.6 kg 50.7 kg      Telemetry    Sinus rhythm with intermittent PVCs, rates 60s-90s - Personally Reviewed  ECG    No new since 2/23- Personally Reviewed  Physical Exam  GEN: Well nourished, well developed in no acute distress NECK: No JVD CARDIAC: regular rhythm, normal S1 and S2, no  rubs or gallops. No murmur. VASCULAR: Radial pulses 2+ bilaterally.  RESPIRATORY:  Clear to auscultation without wheezing or rhonchi. Velcro crackles at bases ABDOMEN: Soft, non-tender, non-distended MUSCULOSKELETAL:  Moves all 4 limbs independently SKIN: Warm and dry, no edema NEUROLOGIC:  No focal neuro deficits noted. PSYCHIATRIC:  Normal affect   Labs    High Sensitivity Troponin:   Recent Labs  Lab 12/15/20 1514  TROPONINIHS 58*      Chemistry Recent Labs  Lab 12/21/20 0049 12/22/20 0835 12/23/20 0214  NA 136 134* 132*  K 3.9 3.4* 3.4*  CL 93* 94* 93*  CO2 29 28 26   GLUCOSE 124* 166* 115*  BUN 33* 42* 48*  CREATININE 1.31* 1.27* 1.79*  CALCIUM 10.2 10.3 10.2  GFRNONAA 40* 42* 28*  ANIONGAP 14 12 13      Hematology Recent Labs  Lab 12/21/20 1143 12/22/20 0835 12/23/20 0214  WBC 16.0* 19.0* 13.1*  RBC 5.09 4.97 4.61  HGB 15.9* 15.3* 14.5  HCT 46.5* 46.2* 42.2  MCV 91.4 93.0 91.5  MCH 31.2 30.8 31.5  MCHC 34.2 33.1 34.4  RDW 15.5 15.5 15.8*  PLT 179 166 153  BNP No results for input(s): BNP, PROBNP in the last 168 hours.   DDimer No results for input(s): DDIMER in the last 168 hours.   Radiology    No results found.  Cardiac Studies   Echo 12/16/20 1. Left ventricular ejection fraction, by estimation, is 25 to 30%. Left  ventricular ejection fraction by 3D volume is 31 %. The left ventricle has  severely decreased function. The left ventricle demonstrates global  hypokinesis. There is mild left  ventricular hypertrophy. Left ventricular diastolic parameters are  consistent with Grade I diastolic dysfunction (impaired relaxation).  2. Right ventricular systolic function is normal. The right ventricular  size is normal. There is mildly elevated pulmonary artery systolic  pressure.  3. Left atrial size was severely dilated.  4. The mitral valve is degenerative. Moderate mitral valve regurgitation.  No evidence of mitral stenosis. Moderate  mitral annular calcification.  5. The aortic valve is calcified. Aortic valve regurgitation is trivial.  Mild aortic valve sclerosis is present, with no evidence of aortic valve  stenosis.  6. Aortic dilatation noted. There is mild dilatation of the ascending  aorta, measuring 39 mm.  7. The inferior vena cava is normal in size with greater than 50%  respiratory variability, suggesting right atrial pressure of 3 mmHg.  R/LHC 12/17/20  Prox LAD to Mid LAD lesion is 20% stenosed.  Prox RCA lesion is 25% stenosed.  LV end diastolic pressure is mildly elevated.  Hemodynamic findings consistent with mild pulmonary hypertension.   1. Minimal nonobstructive CAD 2. Mildly elevated LVEDP 3. Mild pulmonary HTN. Mean PAP 22 mm Hg 4. Reduced cardiac output 2.6 L/min with index 1.72  Plan: medical management.   Patient Profile     87 y.o. female with PMH hypertension, hyperlipidemia, COPD, hyponatremia 2/2 HCTZ who is being followed in consultation at the request of Dr. Tyrell Antonio for new diagnosis of acute systolic and diastolic heart failure  Assessment & Plan    Acute systolic and diastolic heart failure, EF 25-30% Nonischemic cardiomyopathy -echo, cath this admission as above -BNP on admission 3235 -on losartan 100 mg daily, spironolactone 25 mg daily, hydralazine, isosorbide. Holding losartan and spironolactone due to AKI, will also put hold parameters on hydralazine and isosorbide -not currently on metoprolol succinate, would use 12.5 mg daily once BP improves -recommend SGLT2i once kidneys stabilize -Admission weight not charted. Weight on 2/25 53.6 kg, weight this morning 48.7 kg. I/O inaccurate. Has continued to decrease weight despite holding diuretics -Cr now elevating again, up to 1.79. Was 0.8 range on admission. With decreasing weight, poor appetite, will give IV fluids today. She has continued to lose weight despite holding diuretics. -K 3.4 today. Will add K to IV fluids  for supplementation as she has not been eating well. -NICM workup: TSH unremarkable, iron studies unremarable, cortisol unremarkable, A1c 5.8. SPEP unremarkable -strict I/O, daily weights, fluid restriction  Nonobstructive CAD Hyperlipidemia -aspirin 81 mg started this admission -on lovastatin as an outpatient, pravastatin as an inpatient -LDL goal <70  COPD -per primary team  Mild thoracic aortic dilation -follow up as outpatient  For questions or updates, please contact [facility] Please consult [email or web address] for contact info under     Signed, Buford Dresser, MD  12/23/2020, 5:48 PM

## 2020-12-23 NOTE — Progress Notes (Signed)
Mobility Specialist - Progress Note   12/23/20 1448  Mobility  Activity Transferred to/from [facility]  Level of Assistance Minimal assist, patient does 75% or more  Assistive Device None (handheld assist)  Mobility Response Tolerated fair  Mobility performed by Mobility specialist;Nurse  $Mobility charge 1 Mobility   Pt's BP 77/55 prior to standing. She stood and pivoted to [facility] w/ handheld assist from RN and myself. RN stated he will get pt back into bed on his own.   Pricilla Handler Mobility Specialist Mobility Specialist Phone: [phone]

## 2020-12-24 DIAGNOSIS — I429 Cardiomyopathy, unspecified: Secondary | ICD-10-CM | POA: Diagnosis not present

## 2020-12-24 DIAGNOSIS — I5041 Acute combined systolic (congestive) and diastolic (congestive) heart failure: Secondary | ICD-10-CM | POA: Diagnosis not present

## 2020-12-24 DIAGNOSIS — I959 Hypotension, unspecified: Secondary | ICD-10-CM | POA: Diagnosis not present

## 2020-12-24 DIAGNOSIS — N179 Acute kidney failure, unspecified: Secondary | ICD-10-CM | POA: Diagnosis not present

## 2020-12-24 LAB — BASIC METABOLIC PANEL
Anion gap: 9 (ref 5–15)
BUN: 53 mg/dL — ABNORMAL HIGH (ref 8–23)
CO2: 23 mmol/L (ref 22–32)
Calcium: 9.3 mg/dL (ref 8.9–10.3)
Chloride: 99 mmol/L (ref 98–111)
Creatinine, Ser: 1.72 mg/dL — ABNORMAL HIGH (ref 0.44–1.00)
GFR, Estimated: 29 mL/min — ABNORMAL LOW (ref >60)
Glucose, Bld: 81 mg/dL (ref 70–99)
Potassium: 4.3 mmol/L (ref 3.5–5.1)
Sodium: 131 mmol/L — ABNORMAL LOW (ref 135–145)

## 2020-12-24 LAB — CBC
HCT: 38.1 % (ref 36.0–46.0)
Hemoglobin: 12.9 g/dL (ref 12.0–15.0)
MCH: 31.2 pg (ref 26.0–34.0)
MCHC: 33.9 g/dL (ref 30.0–36.0)
MCV: 92.3 fL (ref 80.0–100.0)
Platelets: 148 10*3/uL — ABNORMAL LOW (ref 150–400)
RBC: 4.13 MIL/uL (ref 3.87–5.11)
RDW: 15.7 % — ABNORMAL HIGH (ref 11.5–15.5)
WBC: 10.5 10*3/uL (ref 4.0–10.5)
nRBC: 0 % (ref 0.0–0.2)

## 2020-12-24 NOTE — Progress Notes (Signed)
Mobility Specialist - Progress Note   12/24/20 1556  Mobility  Activity Ambulated to bathroom  Level of Assistance Minimal assist, patient does 75% or more  Assistive Device Front wheel walker  Distance Ambulated (ft) 40 ft (20 ft x 2)  Mobility Response Tolerated well  Mobility performed by Mobility specialist  $Mobility charge 1 Mobility   Pre-mobility: 60 HR, 109/73 BP During mobility: 96 HR Post-mobility: 64 HR, 110/71 BP  Pt min assist to stand from bed and toilet. She required frequent verbal cues for hand placement on RW and for proper gait pattern. Pt ambulated to toilet to urinate, then back to bed. Asx throughout.   Pricilla Handler Mobility Specialist Mobility Specialist Phone: [phone]

## 2020-12-24 NOTE — Progress Notes (Signed)
Patient has had 1 episodes of Acc. Junct. rhythm while sleeping tonight at 20:00 and has had a short run S.B. 45 otherwise in  S.R. 60-80 while sleeping.

## 2020-12-24 NOTE — TOC Transition Note (Signed)
Transition of Care [facility]) - CM/SW Discharge Note   Patient Details  Name: [name] MRN: [id number] Date of Birth: [birth year]  Transition of Care [facility]) CM/SW Contact:  Vinie Sill, [place] Phone Number: 12/24/2020, 2:02 PM   Clinical Narrative:     CSW provided patient's son with SNF bed offers. Family states they will review and give CSW a call with their top two choices.   Thurmond Butts, MSW, LCSW Clinical Social Worker   Final next level of care: Skilled Nursing Facility Barriers to Discharge: Insurance Authorization,Continued Medical Work up   Patient Goals and CMS Choice        Discharge Engineer, manufacturing In-house Referral: Clinical Social Work                                   Social Determinants of Health (SDOH) Interventions Food Insecurity Interventions: Intervention Not Indicated Financial Strain Interventions: Intervention Not Indicated Housing Interventions: Intervention Not Indicated Physical Activity Interventions: Intervention Not Indicated Transportation Interventions: Intervention Not Indicated Alcohol Brief Interventions/Follow-up: AUDIT Score <7 follow-up not indicated   Readmission Risk Interventions No flowsheet data found.

## 2020-12-24 NOTE — Care Management Important Message (Signed)
Important Message  Patient Details  Name: [name] MRN: [id number] Date of Birth: [birth year]   Medicare Important Message Given:  Yes     Shelda Altes 12/24/2020, 11:06 AM

## 2020-12-24 NOTE — Progress Notes (Signed)
Heart Failure Navigator Progress Note  Assessed for Heart & Vascular TOC clinic readiness. Spoke with daughter, [name] today regarding pt DC to SNF per therapy recommendations. Upon further chart review pt appears very frail and weak; medication optimization/titration minimal with current BP ans renal function (although improving, but still holding lasix).  Unfortunately, at this time, the patient does not meet criteria.  Navigator available for reassessment of patient.   Pricilla Holm, RN, BSN Heart Failure Nurse Navigator [phone]

## 2020-12-24 NOTE — Progress Notes (Signed)
Occupational Therapy Treatment Patient Details Name: [name] MRN: [id number] DOB: [birth year] Today's Date: 12/24/2020    History of present illness Pt is an 87 y/o female admitted secondary to SOB and BLE swelling. Thought to be secondary to acute CHF. PMH includes HTN and COPD.   OT comments  Pt making little progress over the last few sessions. Pt appears weaker than on evaluation but did walk and get OOB which is an improvement from last session. Pt incontinent of bowel on the floor on way to the bathroom but did not have any awareness of it.  Finished toileting on [facility] with mod assist and then returned to recliner with min assist to guide walker.  Pt thought it was July 2004 and was mildly confused during the session with orientation.  Feel she may benefit from SNF rehab at this point before returning home with her family.   Follow Up Recommendations  SNF;Supervision/Assistance - 24 hour    Equipment Recommendations  3 in 1 bedside commode    Recommendations for Other Services      Precautions / Restrictions Precautions Precautions: Fall Precaution Comments: watch SpO2 Restrictions Weight Bearing Restrictions: No       Mobility Bed Mobility Overal bed mobility: Needs Assistance Bed Mobility: Supine to Sit     Supine to sit: Mod assist     General bed mobility comments: Pt required assist getting to EOB and feet on floor    Transfers Overall transfer level: Needs assistance Equipment used: Rolling walker (2 wheeled) Transfers: Sit to/from [facility] Sit to Stand: Min assist Stand pivot transfers: Min assist       General transfer comment: vc for safe hand placement with RW for power up to standing from bed and from toilet    Balance Overall balance assessment: Needs assistance Sitting-balance support: Feet supported;No upper extremity supported Sitting balance-Leahy Scale: Good     Standing balance support: Bilateral upper extremity  supported;During functional activity Standing balance-Leahy Scale: Poor Standing balance comment: Reliant on BUE support                           ADL either performed or assessed with clinical judgement   ADL Overall ADL's : Needs assistance/impaired Eating/Feeding: Set up;Sitting Eating/Feeding Details (indicate cue type and reason): some assist opening contaniers Grooming: Set up;Sitting Grooming Details (indicate cue type and reason): groomed in sitting. Attempted to walk to sink when pt had bowel movement on the floor.  Pt finished moving bowels on BSC and then too tired to go to sink to groom.                 Toilet Transfer: Minimal assistance;BSC;Ambulation Armed forces technical officer Details (indicate cue type and reason): Pt attempted to walk to bathroom but had bowel movement on floor.  Min assist to ambulate very slowly and min assist on and off commode. Toileting- Water quality scientist and Hygiene: Sit to/from stand;Moderate assistance Toileting - Clothing Manipulation Details (indicate cue type and reason): Pt required assist cleaning self after bowel movement.  Pt too tired to stand and clean self at same time.     Functional mobility during ADLs: Minimal assistance;Rolling walker;Cueing for safety General ADL Comments: Pt fatigued quickly today not making it to bathroom and then requesting to go back to recliner after bowel movement.     Vision   Vision Assessment?: No apparent visual deficits   Perception     Praxis  Cognition Arousal/Alertness: Awake/alert Behavior During Therapy: WFL for tasks assessed/performed Overall Cognitive Status: Impaired/Different from baseline Area of Impairment: Orientation;Problem solving;Safety/judgement                 Orientation Level: Disoriented to;Time   Memory: Decreased short-term memory   Safety/Judgement: Decreased awareness of safety   Problem Solving: Slow processing General Comments: pt is  unaware when incontinent of stool        Exercises     Shoulder Instructions       General Comments Pt appears weaker and tired today.  Pt stating she just has not energy and required encouragement to get out of bed.    Pertinent Vitals/ Pain       Pain Assessment: No/denies pain  Home Living                                          Prior Functioning/Environment              Frequency  Min 2X/week        Progress Toward Goals  OT Goals(current goals can now be found in the care plan section)  Progress towards OT goals: Not progressing toward goals - comment (weaker and not feeling well)  Acute Rehab OT Goals Patient Stated Goal: to feel better OT Goal Formulation: With patient Time For Goal Achievement: 12/31/20 Potential to Achieve Goals: Good ADL Goals Pt Will Perform Grooming: with supervision;standing Pt Will Perform Upper Body Dressing: with modified independence;sitting Pt Will Perform Lower Body Dressing: with supervision;sit to/from stand Pt Will Transfer to Toilet: with supervision;ambulating Pt Will Perform Toileting - Clothing Manipulation and hygiene: with modified independence;sit to/from stand Additional ADL Goal #1: Pt will recall 3 ways of conserving energy during ADL with no cues  Plan Discharge plan needs to be updated    Co-evaluation                 AM-PAC OT "6 Clicks" Daily Activity     Outcome Measure   Help from another person eating meals?: A Little Help from another person taking care of personal grooming?: A Little Help from another person toileting, which includes using toliet, bedpan, or urinal?: A Lot Help from another person bathing (including washing, rinsing, drying)?: A Little Help from another person to put on and taking off regular upper body clothing?: A Little Help from another person to put on and taking off regular lower body clothing?: A Lot 6 Click Score: 16    End of Session Equipment  Utilized During Treatment: Gait belt;Rolling walker  OT Visit Diagnosis: Other abnormalities of gait and mobility (R26.89);Muscle weakness (generalized) (M62.81)   Activity Tolerance Patient limited by fatigue   Patient Left in chair;with call bell/phone within reach   Nurse Communication Mobility status;Precautions        Time: 4709-6283 OT Time Calculation (min): 31 min  Charges: OT General Charges $OT Visit: 1 Visit OT Treatments $Self Care/Home Management : 23-37 mins   Glenford Peers 12/24/2020, 10:29 AM

## 2020-12-24 NOTE — Progress Notes (Signed)
Heart Failure Stewardship Pharmacist Progress Note   PCP: Hulan Fess, MD PCP-Cardiologist: Donato Heinz, MD    HPI:  87 yo F with PMH of HTN, HLD, COPD, squamous cell skin cancer, anxiety/depression, and hypokalemia. She presented to the ED on 12/15/20 with shortness of breath and fluid overload. Admitted for acute combine systolic and diastolic HF. An ECHO was done on 12/16/20 and LVEF is 25-30%. R/LHC done on 12/17/20 and found to have minimal nonobstructive CAD and mildly elevated filling pressures.   Current HF Medications: Hydralazine 10 mg q8h Imdur 30 mg daily  Prior to admission HF Medications: Losartan 50 mg daily  Pertinent Lab Values: . Serum creatinine 1.72, BUN 53, Potassium 4.3, Sodium 131, BNP 3235  Vital Signs: . Weight: 107 lbs (admission weight: 118 lbs) . Blood pressure: 100-110/60s . Heart rate: 60s   Medication Assistance / Insurance Benefits Check: Does the patient have prescription insurance?  Yes Type of insurance plan: UHC Medicare  Does the patient qualify for medication assistance through manufacturers or grants?   Pending . Eligible grants and/or patient assistance programs: pending . Medication assistance applications in progress: none  . Medication assistance applications approved: none  Approved medication assistance renewals will be completed by: pending  Outpatient Pharmacy:  Prior to admission outpatient pharmacy: [facility] Is the patient willing to use [place] at discharge? Yes  Assessment: 1. Acute on chronic systolic CHF (EF 17-61%), due to NICM. NYHA class III symptoms. - Off metoprolol XL with baseline bradycardia and occasional junctional rhythm - Off losartan, and spironolactone with bump in SCr and low BP (although BP slowly starting to improve) - Consider starting Farxiga 10 mg daily prior to discharge if SCr allows. Minimal to no impact on BP.  - Continue hydralazine 10 mg q8h  - Continue Imdur 30 mg  daily   Plan: 1) Medication changes recommended at this time: - Continue current regimen  2) Patient assistance application(s): - Entresto copay $47 per month - Farxiga copay $47 per month - Can help complete patient assistance applications to lower copay to $0 per month for both prescriptions  3)  Education  - To be completed prior to discharge  Kerby Nora, PharmD, BCPS Heart Failure Stewardship Pharmacist Phone [phone]

## 2020-12-24 NOTE — Progress Notes (Signed)
PROGRESS NOTE    [name]  NKN:[id number] DOB: April 18, 1934 DOA: 12/15/2020 PCP: Hulan Fess, MD   Chief Complaint  Patient presents with  . Shortness of Breath   Brief Narrative: 87 year old female with hypertension hyponatremia from HCTZ, COPD, anxiety/depression presents with new onset of shortness of breath with significantly elevated blood pressure found to have fluid overload chest x-ray with bilateral pleural effusion, hypokalemia, cardiologist consulted management IV Lasix underwent cardiac cath with no significant CAD but an EF of 25%. Patient is debilitated decondition PT OT working and will need a skilled nursing facility. Patient has been having uptrending creatinine.    Subjective: Seen this morning.  Daughter at the bedside.  Patient is sleepy this morning but able to wake up and interact no new complaints. Feeling tired.  Assessment & Plan:  Acute combined systolic and diastolic CHF, NYHA class 4, LVEF 25 to 30% Nonischemic cardiomyopathy-status post cardiac cath this admission S/p cardiac work-up including cardiac cath echo this admission.  BNP 3234 on admission. Patient with uptrending creatinine despite holding diuretics.  Received IV fluids creatinine appears somewhat better.Losartan also held.  Metoprolol held due to occasional junctional rhythm.  Continue hydralazine, Imdur monitor function.  Continue plan as per cardiology.  Net IO Since Admission: -6,927 mL [12/24/20 1320]  Nonobstructive CAD/hyperlipidemia: Continue ASA 81 mg ( new), pravastatin.    Mild thoracic aortic dilation, currently follow-up as outpatient  AKI in the setting of CHF needing IV Lasix also with blood pressures soft: Blood pressure stable, creatinine appears to be creatinine 1.7 continue to hold Lasix losartan and Aldactone.  With fluids creatinine slightly better.Encourage oral hydration. Recent Labs  Lab 12/20/20 0035 12/21/20 0049 12/22/20 0835 12/23/20 0214 12/24/20 0600   BUN 28* 33* 42* 48* 53*  CREATININE 1.21* 1.31* 1.27* 1.79* 1.72*   Hypokalemia improved Recent Labs  Lab 12/20/20 0035 12/21/20 0049 12/22/20 0835 12/23/20 0214 12/24/20 0600  K 3.5 3.9 3.4* 3.4* 4.3   Hypertension: Well-controlled today.  Was hypotensive soft in the 80s 12/22/20.  Continue losartan and Bactrim.  Continue Imdur hydralazine.  Plan per cardiology.  COPD compensated, cont nebulizer Mucinex flutter valve   Hyperglycemia/prediabetes l A1c 5.8, diet modification advised  Vitamin D deficiency continue supplementation  Hyponatremia monitor closely  Leukocytosis unclear etiology, resolved Recent Labs  Lab 12/17/20 1820 12/21/20 1143 12/22/20 0835 12/23/20 0214 12/24/20 0600  WBC 10.1 16.0* 19.0* 13.1* 10.5   Memory problems over last 6 months worse, B12 low normal continue to replete outpatient neurology follow-up will be needed.  Deconditioning/debility will continue to work with PT OT will need a skilled nursing facility once okay with cardiology  Diet Order            Diet Heart Room service appropriate? Yes; Fluid consistency: Thin  Diet effective now                Patient's Body mass index is 21.29 kg/m.  DVT prophylaxis: enoxaparin (LOVENOX) injection 30 mg Start: 12/21/20 0800 Code Status:   Code Status: Full Code Family Communication: plan of care discussed with patient at bedside. Discussed with nursing staff. Status is: Inpatient Remains inpatient appropriate because:Inpatient level of care appropriate due to severity of illness   Dispo: The patient is from: Home              Anticipated d/c is to: SNF once creatinine and BP stabilizes in the next 1 day or so  Patient currently is not medically stable to d/c.   Difficult to place patient No    Unresulted Labs (From admission, onward)          Start     Ordered   12/24/20 0500  Creatinine, serum  (enoxaparin (LOVENOX)    CrCl >/= 30 ml/min)  Weekly,   R     Comments:  while on enoxaparin therapy   Question:  Specimen collection method  Answer:  Lab=Lab collect   12/17/20 1528   12/24/20 0109  Basic metabolic panel  Daily,   R     Question:  Specimen collection method  Answer:  Lab=Lab collect   12/23/20 1410   12/24/20 0500  CBC  Daily,   R     Question:  Specimen collection method  Answer:  Lab=Lab collect   12/23/20 1410          Medications reviewed:  Scheduled Meds: . aspirin EC  81 mg Oral QHS  . diclofenac Sodium  2 g Topical QID  . docusate sodium  100 mg Oral BID  . enoxaparin (LOVENOX) injection  30 mg Subcutaneous Q24H  . guaiFENesin  1,200 mg Oral BID  . hydrALAZINE  10 mg Oral Q8H  . ipratropium-albuterol  3 mL Nebulization BID  . isosorbide mononitrate  30 mg Oral Daily  . pantoprazole  40 mg Oral BID  . pravastatin  40 mg Oral q1800  . sodium chloride flush  3 mL Intravenous Q12H  . venlafaxine XR  75 mg Oral QPC breakfast  . vitamin B-12  100 mcg Oral Daily  . Vitamin D (Ergocalciferol)  50,000 Units Oral Q7 days   Continuous Infusions: . sodium chloride      Consultants:see note  Procedures:see note  Antimicrobials: Anti-infectives (From admission, onward)   None     Culture/Microbiology    Component Value Date/Time   SDES ABSCESS 03/05/2013 2100   SDES ABSCESS 03/05/2013 2100   [place] LEFT SEPTIC OLECRANONAAAAA 03/05/2013 2100   [place] LEFT SEPTIC OLECRANON 03/05/2013 2100   CULT  03/05/2013 2100    ABUNDANT STAPHYLOCOCCUS AUREUS Note: RIFAMPIN AND GENTAMICIN SHOULD NOT BE USED AS SINGLE DRUGS FOR TREATMENT OF STAPH INFECTIONS.   CULT NO ANAEROBES ISOLATED 03/05/2013 2100   REPTSTATUS 03/08/2013 FINAL 03/05/2013 2100   REPTSTATUS 03/10/2013 FINAL 03/05/2013 2100    Other culture-see note  Objective: Vitals: Today's Vitals   12/24/20 0630 12/24/20 0802 12/24/20 0851 12/24/20 1115  BP: 112/68 (!) 110/59    Pulse: 62 61 (!) 58   Resp: 20 18 18    Temp:  (!) 97.3 F (36.3 C)    TempSrc:   Oral    SpO2: 97% 97% 100%   Weight:      Height:      PainSc: 0-No pain   0-No pain    Intake/Output Summary (Last 24 hours) at 12/24/2020 1320 Last data filed at 12/24/2020 0639 Gross per 24 hour  Intake 1300 ml  Output 200 ml  Net 1100 ml   Filed Weights   12/21/20 0300 12/23/20 0617 12/24/20 0406  Weight: 50.6 kg 48.7 kg 51.1 kg   Weight change: 2.4 kg  Intake/Output from previous day: 03/03 0701 - 03/04 0700 In: 3235 [P.O.:300; I.V.:1003] Out: 200 [Urine:200] Intake/Output this shift: No intake/output data recorded. Filed Weights   12/21/20 0300 12/23/20 0617 12/24/20 0406  Weight: 50.6 kg 48.7 kg 51.1 kg    Examination: General exam: AAO, tired appearing, on room air. HEENT:Oral mucosa moist,  Ear/Nose WNL grossly, dentition normal. Respiratory system: bilaterally diminished,no wheezing or crackles,no use of accessory muscle Cardiovascular system: S1 & S2 +, No JVD,. Gastrointestinal system: Abdomen soft, NT,ND, BS+ Nervous System:Alert, awake, moving extremities and grossly nonfocal Extremities: No edema, distal peripheral pulses palpable.  Skin: No rashes,no icterus. MSK: Normal muscle bulk,tone, power  Data Reviewed: I have personally reviewed following labs and imaging studies CBC: Recent Labs  Lab 12/17/20 1820 12/21/20 1143 12/22/20 0835 12/23/20 0214 12/24/20 0600  WBC 10.1 16.0* 19.0* 13.1* 10.5  NEUTROABS  --   --   --  11.8*  --   HGB 14.3 15.9* 15.3* 14.5 12.9  HCT 43.9 46.5* 46.2* 42.2 38.1  MCV 95.6 91.4 93.0 91.5 92.3  PLT 193 179 166 153 213*   Basic Metabolic Panel: Recent Labs  Lab 12/20/20 0035 12/21/20 0049 12/22/20 0835 12/23/20 0214 12/24/20 0600  NA 135 136 134* 132* 131*  K 3.5 3.9 3.4* 3.4* 4.3  CL 95* 93* 94* 93* 99  CO2 31 29 28 26 23   GLUCOSE 120* 124* 166* 115* 81  BUN 28* 33* 42* 48* 53*  CREATININE 1.21* 1.31* 1.27* 1.79* 1.72*  CALCIUM 9.9 10.2 10.3 10.2 9.3   GFR: Estimated Creatinine Clearance: 18.4 mL/min  (A) (by C-G formula based on SCr of 1.72 mg/dL (H)). Liver Function Tests: No results for input(s): AST, ALT, ALKPHOS, BILITOT, PROT, ALBUMIN in the last 168 hours. No results for input(s): LIPASE, AMYLASE in the last 168 hours. No results for input(s): AMMONIA in the last 168 hours. Coagulation Profile: No results for input(s): INR, PROTIME in the last 168 hours. Cardiac Enzymes: No results for input(s): CKTOTAL, CKMB, CKMBINDEX, TROPONINI in the last 168 hours. BNP (last 3 results) No results for input(s): PROBNP in the last 8760 hours. HbA1C: No results for input(s): HGBA1C in the last 72 hours. CBG: No results for input(s): GLUCAP in the last 168 hours. Lipid Profile: No results for input(s): CHOL, HDL, LDLCALC, TRIG, CHOLHDL, LDLDIRECT in the last 72 hours. Thyroid Function Tests: No results for input(s): TSH, T4TOTAL, FREET4, T3FREE, THYROIDAB in the last 72 hours. Anemia Panel: No results for input(s): VITAMINB12, FOLATE, FERRITIN, TIBC, IRON, RETICCTPCT in the last 72 hours. Sepsis Labs: No results for input(s): PROCALCITON, LATICACIDVEN in the last 168 hours.  Recent Results (from the past 240 hour(s))  Resp Panel by RT-PCR (Flu A&B, Covid) Nasopharyngeal Swab     Status: None   Collection Time: 12/15/20  6:13 PM   Specimen: Nasopharyngeal Swab; Nasopharyngeal(NP) swabs in vial transport medium  Result Value Ref Range Status   SARS Coronavirus 2 by RT PCR NEGATIVE NEGATIVE Final    Comment: (NOTE) SARS-CoV-2 target nucleic acids are NOT DETECTED.  The SARS-CoV-2 RNA is generally detectable in upper respiratory specimens during the acute phase of infection. The lowest concentration of SARS-CoV-2 viral copies this assay can detect is 138 copies/mL. A negative result does not preclude SARS-Cov-2 infection and should not be used as the sole basis for treatment or other patient management decisions. A negative result may occur with  improper specimen collection/handling,  submission of specimen other than nasopharyngeal swab, presence of viral mutation(s) within the areas targeted by this assay, and inadequate number of viral copies(<138 copies/mL). A negative result must be combined with clinical observations, patient history, and epidemiological information. The expected result is Negative.  Fact Sheet for Patients:  EntrepreneurPulse.com.au  Fact Sheet for Healthcare Providers:  IncredibleEmployment.be  This test is no t yet approved or  cleared by the [place] and  has been authorized for detection and/or diagnosis of SARS-CoV-2 by FDA under an Emergency Use Authorization (EUA). This EUA will remain  in effect (meaning this test can be used) for the duration of the COVID-19 declaration under Section 564(b)(1) of the Act, 21 U.S.C.section 360bbb-3(b)(1), unless the authorization is terminated  or revoked sooner.       Influenza A by PCR NEGATIVE NEGATIVE Final   Influenza B by PCR NEGATIVE NEGATIVE Final    Comment: (NOTE) The Xpert Xpress SARS-CoV-2/FLU/RSV plus assay is intended as an aid in the diagnosis of influenza from Nasopharyngeal swab specimens and should not be used as a sole basis for treatment. Nasal washings and aspirates are unacceptable for Xpert Xpress SARS-CoV-2/FLU/RSV testing.  Fact Sheet for Patients: EntrepreneurPulse.com.au  Fact Sheet for Healthcare Providers: IncredibleEmployment.be  This test is not yet approved or cleared by the [place] FDA and has been authorized for detection and/or diagnosis of SARS-CoV-2 by FDA under an Emergency Use Authorization (EUA). This EUA will remain in effect (meaning this test can be used) for the duration of the COVID-19 declaration under Section 564(b)(1) of the Act, 21 U.S.C. section 360bbb-3(b)(1), unless the authorization is terminated or revoked.  Performed at [facility], [place] [address], [place], [place] [address]   MRSA PCR Screening     Status: None   Collection Time: 12/16/20  3:20 PM   Specimen: Nasal Mucosa; Nasopharyngeal  Result Value Ref Range Status   MRSA by PCR NEGATIVE NEGATIVE Final    Comment:        The GeneXpert MRSA Assay (FDA approved for NASAL specimens only), is one component of a comprehensive MRSA colonization surveillance program. It is not intended to diagnose MRSA infection nor to guide or monitor treatment for MRSA infections. Performed at [facility], [place] [address], [place], [place] [address]      Radiology Studies: No results found.   LOS: 9 days   Antonieta Pert, MD [facility]  12/24/2020, 1:20 PM

## 2020-12-24 NOTE — Progress Notes (Signed)
Progress Note  Patient Name: [name] Date of Encounter: 12/24/2020  [facility] Cardiologist: Donato Heinz, MD   Subjective   Continues to feel weak, tired. No appetite. No chest pain, breathing is stable.  Inpatient Medications    Scheduled Meds: . aspirin EC  81 mg Oral QHS  . diclofenac Sodium  2 g Topical QID  . docusate sodium  100 mg Oral BID  . enoxaparin (LOVENOX) injection  30 mg Subcutaneous Q24H  . guaiFENesin  1,200 mg Oral BID  . hydrALAZINE  10 mg Oral Q8H  . ipratropium-albuterol  3 mL Nebulization BID  . isosorbide mononitrate  30 mg Oral Daily  . pantoprazole  40 mg Oral BID  . pravastatin  40 mg Oral q1800  . sodium chloride flush  3 mL Intravenous Q12H  . venlafaxine XR  75 mg Oral QPC breakfast  . vitamin B-12  100 mcg Oral Daily  . Vitamin D (Ergocalciferol)  50,000 Units Oral Q7 days   Continuous Infusions: . sodium chloride     PRN Meds: sodium chloride, acetaminophen, ALPRAZolam, benzonatate, ondansetron (ZOFRAN) IV, sodium chloride flush   Vital Signs    Vitals:   12/23/20 2353 12/24/20 0406 12/24/20 0630 12/24/20 0802  BP: (!) 108/54 115/64 112/68 (!) 110/59  Pulse: 62 68 62 61  Resp: 20 20 20 18   Temp: 98.6 F (37 C) 98 F (36.7 C)  (!) 97.3 F (36.3 C)  TempSrc: Oral Oral  Oral  SpO2: 96% 93% 97% 97%  Weight:  51.1 kg    Height:        Intake/Output Summary (Last 24 hours) at 12/24/2020 1024 Last data filed at 12/24/2020 2993 Gross per 24 hour  Intake 1300 ml  Output 200 ml  Net 1100 ml   Last 3 Weights 12/24/2020 12/23/2020 12/21/2020  Weight (lbs) 112 lb 10.5 oz 107 lb 5.8 oz 111 lb 8.8 oz  Weight (kg) 51.1 kg 48.7 kg 50.6 kg      Telemetry    Sinus rhythm, brief junctional rhythm overnight, rates upper 40s-60s - Personally Reviewed  ECG    No new since 2/23- Personally Reviewed  Physical Exam  GEN: Well nourished, well developed in no acute distress NECK: No JVD CARDIAC: regular rhythm, normal S1 and  S2, no rubs or gallops. No murmur. VASCULAR: Radial pulses 2+ bilaterally.  RESPIRATORY:  Clear to auscultation without wheezing or rhonchi. Bibasilar crackles similar to yesterday ABDOMEN: Soft, non-tender, non-distended MUSCULOSKELETAL:  Moves all 4 limbs independently SKIN: Warm and dry, no edema NEUROLOGIC:  No focal neuro deficits noted. PSYCHIATRIC:  Normal affect   Labs    High Sensitivity Troponin:   Recent Labs  Lab 12/15/20 1514  TROPONINIHS 58*      Chemistry Recent Labs  Lab 12/22/20 0835 12/23/20 0214 12/24/20 0600  NA 134* 132* 131*  K 3.4* 3.4* 4.3  CL 94* 93* 99  CO2 28 26 23   GLUCOSE 166* 115* 81  BUN 42* 48* 53*  CREATININE 1.27* 1.79* 1.72*  CALCIUM 10.3 10.2 9.3  GFRNONAA 42* 28* 29*  ANIONGAP 12 13 9      Hematology Recent Labs  Lab 12/22/20 0835 12/23/20 0214 12/24/20 0600  WBC 19.0* 13.1* 10.5  RBC 4.97 4.61 4.13  HGB 15.3* 14.5 12.9  HCT 46.2* 42.2 38.1  MCV 93.0 91.5 92.3  MCH 30.8 31.5 31.2  MCHC 33.1 34.4 33.9  RDW 15.5 15.8* 15.7*  PLT 166 153 148*    BNP  No results for input(s): BNP, PROBNP in the last 168 hours.   DDimer No results for input(s): DDIMER in the last 168 hours.   Radiology    No results found.  Cardiac Studies   Echo 12/16/20 1. Left ventricular ejection fraction, by estimation, is 25 to 30%. Left  ventricular ejection fraction by 3D volume is 31 %. The left ventricle has  severely decreased function. The left ventricle demonstrates global  hypokinesis. There is mild left  ventricular hypertrophy. Left ventricular diastolic parameters are  consistent with Grade I diastolic dysfunction (impaired relaxation).  2. Right ventricular systolic function is normal. The right ventricular  size is normal. There is mildly elevated pulmonary artery systolic  pressure.  3. Left atrial size was severely dilated.  4. The mitral valve is degenerative. Moderate mitral valve regurgitation.  No evidence of mitral  stenosis. Moderate mitral annular calcification.  5. The aortic valve is calcified. Aortic valve regurgitation is trivial.  Mild aortic valve sclerosis is present, with no evidence of aortic valve  stenosis.  6. Aortic dilatation noted. There is mild dilatation of the ascending  aorta, measuring 39 mm.  7. The inferior vena cava is normal in size with greater than 50%  respiratory variability, suggesting right atrial pressure of 3 mmHg.  R/LHC 12/17/20  Prox LAD to Mid LAD lesion is 20% stenosed.  Prox RCA lesion is 25% stenosed.  LV end diastolic pressure is mildly elevated.  Hemodynamic findings consistent with mild pulmonary hypertension.   1. Minimal nonobstructive CAD 2. Mildly elevated LVEDP 3. Mild pulmonary HTN. Mean PAP 22 mm Hg 4. Reduced cardiac output 2.6 L/min with index 1.72  Plan: medical management.   Patient Profile     87 y.o. female with PMH hypertension, hyperlipidemia, COPD, hyponatremia 2/2 HCTZ who is being followed in consultation at the request of Dr. Tyrell Antonio for new diagnosis of acute systolic and diastolic heart failure  Assessment & Plan    Acute systolic and diastolic heart failure, EF 25-30% Nonischemic cardiomyopathy Acute kidney injury, suspect 2/2 dehydration -echo, cath this admission as above -BNP on admission 3235. Diuresed well but then renal function worsened despite not being on diuretics, improved with IV fluids. -was on losartan 100 mg daily, spironolactone 25 mg daily, hydralazine, isosorbide. Holding losartan and spironolactone due to AKI, put hold parameters on hydralazine and isosorbide -not currently on metoprolol succinate, had planned to use if BP improved but now having baseline bradycardia and occasional junctional rhythm. Will hold on this. -recommend SGLT2i (have run cost of Farxiga--can do patient assistance to get this for free for her) once kidneys stabilize. As she has been running more dehydrated than volume up  recently, will need to monitor renal function once this starts. Do not suspect she will need a standing lasix dose, would use only PRN lasix for weight gain. -Admission weight not charted. Weight on 2/25 53.6 kg, weight this morning 51.1 kg. -Cr peaked at 1.79. Was 0.8 range on admission. Now downtrending after IV fluids. Blood pressure improved as well.  -NICM workup: TSH unremarkable, iron studies unremarable, cortisol unremarkable, A1c 5.8. SPEP unremarkable -she has the same basilar crackles today that she had yesterday, does not sound worse post IV fluids. Remains on room air. Encourage PO hydration, no diuresis today.  No change today: Nonobstructive CAD Hyperlipidemia -aspirin 81 mg started this admission -on lovastatin as an outpatient, pravastatin as an inpatient -LDL goal <70  COPD -per primary team  Mild thoracic aortic dilation -follow up  as outpatient  For questions or updates, please contact [place] Please consult [email or web address] for contact info under     Signed, Buford Dresser, MD  12/24/2020, 10:24 AM

## 2020-12-25 DIAGNOSIS — I5041 Acute combined systolic (congestive) and diastolic (congestive) heart failure: Secondary | ICD-10-CM | POA: Diagnosis not present

## 2020-12-25 DIAGNOSIS — L8990 Pressure ulcer of unspecified site, unspecified stage: Secondary | ICD-10-CM | POA: Insufficient documentation

## 2020-12-25 LAB — CBC
HCT: 38.8 % (ref 36.0–46.0)
Hemoglobin: 13 g/dL (ref 12.0–15.0)
MCH: 31 pg (ref 26.0–34.0)
MCHC: 33.5 g/dL (ref 30.0–36.0)
MCV: 92.6 fL (ref 80.0–100.0)
Platelets: 170 10*3/uL (ref 150–400)
RBC: 4.19 MIL/uL (ref 3.87–5.11)
RDW: 15.7 % — ABNORMAL HIGH (ref 11.5–15.5)
WBC: 8.4 10*3/uL (ref 4.0–10.5)
nRBC: 0 % (ref 0.0–0.2)

## 2020-12-25 LAB — BASIC METABOLIC PANEL
Anion gap: 9 (ref 5–15)
BUN: 39 mg/dL — ABNORMAL HIGH (ref 8–23)
CO2: 22 mmol/L (ref 22–32)
Calcium: 9.4 mg/dL (ref 8.9–10.3)
Chloride: 101 mmol/L (ref 98–111)
Creatinine, Ser: 1.1 mg/dL — ABNORMAL HIGH (ref 0.44–1.00)
GFR, Estimated: 50 mL/min — ABNORMAL LOW (ref >60)
Glucose, Bld: 86 mg/dL (ref 70–99)
Potassium: 3.8 mmol/L (ref 3.5–5.1)
Sodium: 132 mmol/L — ABNORMAL LOW (ref 135–145)

## 2020-12-25 NOTE — Progress Notes (Signed)
PROGRESS NOTE    [name]  SAY:[id number] DOB: [birth year] DOA: 12/15/2020 PCP: Hulan Fess, MD   Chief Complaint  Patient presents with  . Shortness of Breath   Brief Narrative: 87 year old female with hypertension hyponatremia from HCTZ, COPD, anxiety/depression presents with new onset of shortness of breath with significantly elevated blood pressure found to have fluid overload chest x-ray with bilateral pleural effusion, hypokalemia, cardiologist consulted management IV Lasix underwent cardiac cath with no significant CAD but an EF of 25%. Patient is debilitated decondition PT OT working and will need a skilled nursing facility. Patient has been having uptrending creatinine.  Medication adjusted given IV fluids blood pressure at this time is stabilized. 3/5-creatinine improved 1.1  Subjective:  Blood pressure has remained stable and creatinine improved 1.1 She is resting comfortably.  Alert awake oriented.  She was having her breakfast this morning.  Assessment & Plan:  Acute combined systolic and diastolic CHF, NYHA class 4, LVEF 25 to 30% Nonischemic cardiomyopathy-status post cardiac cath this admission S/p cardiac work-up including cardiac cath echo this admission.  BNP 3234 on admission. Patient with uptrending creatinine despite holding diuretics.  Received IV fluids losartan Lasix was discontinued.  Renal function has improved.Metoprolol held due to occasional junctional rhythm.  Continue hydralazine, Imdur.  Await for cardiology to adjust medication further prior to discharge to skilled nursing facility.  Net IO Since Admission: -6,657 mL [12/25/20 0801]  Nonobstructive CAD/hyperlipidemia: No chest pain.  Continue ASA 81 mg ( new), pravastatin.    Mild thoracic aortic dilation, currently follow-up as outpatient  AKI in the setting of CHF needing IV Lasix also with blood pressures soft: Blood pressure stable, cardiology has held Lasix losartan and Aldactone also  received IV fluids.  Creatinine now improved to 1.1.  Resume meds as per cards. Recent Labs  Lab 12/21/20 0049 12/22/20 0835 12/23/20 0214 12/24/20 0600 12/25/20 0235  BUN 33* 42* 48* 53* 39*  CREATININE 1.31* 1.27* 1.79* 1.72* 1.10*   Hypokalemia improved Recent Labs  Lab 12/21/20 0049 12/22/20 0835 12/23/20 0214 12/24/20 0600 12/25/20 0235  K 3.9 3.4* 3.4* 4.3 3.8   Hypertension: Blood pressure stable now after stopping losartan Lasix.Hypotensive in the 80s 12/22/20 needing ivf. Plan per cardiology.  COPD compensated, cont nebulizer Mucinex flutter valve   Hyperglycemia/prediabetes l A1c 5.8, diet modification advised  Vitamin D deficiency continue supplementation  Hyponatremia monitor closely  Leukocytosis unclear etiology, resolved Recent Labs  Lab 12/21/20 1143 12/22/20 0835 12/23/20 0214 12/24/20 0600 12/25/20 0235  WBC 16.0* 19.0* 13.1* 10.5 8.4   Memory problems over last 6 months worse, B12 low normal continue to replete outpatient neurology follow-up will be needed.  Deconditioning/debility will continue to work with PT OT will need a skilled nursing facility once okay with cardiology  Diet Order            Diet Heart Room service appropriate? Yes; Fluid consistency: Thin  Diet effective now                Patient's Body mass index is 21.08 kg/m.  DVT prophylaxis: enoxaparin (LOVENOX) injection 30 mg Start: 12/21/20 0800 Code Status:   Code Status: Full Code Family Communication: plan of care discussed with patient at bedside. Discussed with nursing staff.  I had discussed with patient's daughter at the bedside and aware abt plan for SNF once okay with cardio, they are yet to accept offers  Status is: Inpatient Remains inpatient appropriate because:Inpatient level of care appropriate due to severity of  illness   Dispo: The patient is from: Home              Anticipated d/c is to: SNF once bed available and okay with cardiology hopefully next  day or so.              Patient currently is not medically stable to d/c.   Difficult to place patient No    Unresulted Labs (From admission, onward)          Start     Ordered   12/24/20 0500  Creatinine, serum  (enoxaparin (LOVENOX)    CrCl >/= 30 ml/min)  Weekly,   R     Comments: while on enoxaparin therapy   Question:  Specimen collection method  Answer:  Lab=Lab collect   12/17/20 1528   12/24/20 6967  Basic metabolic panel  Daily,   R     Question:  Specimen collection method  Answer:  Lab=Lab collect   12/23/20 1410   12/24/20 0500  CBC  Daily,   R     Question:  Specimen collection method  Answer:  Lab=Lab collect   12/23/20 1410          Medications reviewed:  Scheduled Meds: . aspirin EC  81 mg Oral QHS  . diclofenac Sodium  2 g Topical QID  . docusate sodium  100 mg Oral BID  . enoxaparin (LOVENOX) injection  30 mg Subcutaneous Q24H  . guaiFENesin  1,200 mg Oral BID  . hydrALAZINE  10 mg Oral Q8H  . ipratropium-albuterol  3 mL Nebulization BID  . isosorbide mononitrate  30 mg Oral Daily  . pantoprazole  40 mg Oral BID  . pravastatin  40 mg Oral q1800  . sodium chloride flush  3 mL Intravenous Q12H  . venlafaxine XR  75 mg Oral QPC breakfast  . vitamin B-12  100 mcg Oral Daily  . Vitamin D (Ergocalciferol)  50,000 Units Oral Q7 days   Continuous Infusions: . sodium chloride      Consultants:see note  Procedures:see note  Antimicrobials: Anti-infectives (From admission, onward)   None     Culture/Microbiology    Component Value Date/Time   SDES ABSCESS 03/05/2013 2100   SDES ABSCESS 03/05/2013 2100   [place] LEFT SEPTIC OLECRANONAAAAA 03/05/2013 2100   [place] LEFT SEPTIC OLECRANON 03/05/2013 2100   CULT  03/05/2013 2100    ABUNDANT STAPHYLOCOCCUS AUREUS Note: RIFAMPIN AND GENTAMICIN SHOULD NOT BE USED AS SINGLE DRUGS FOR TREATMENT OF STAPH INFECTIONS.   CULT NO ANAEROBES ISOLATED 03/05/2013 2100   REPTSTATUS 03/08/2013 FINAL  03/05/2013 2100   REPTSTATUS 03/10/2013 FINAL 03/05/2013 2100    Other culture-see note  Objective: Vitals: Today's Vitals   12/24/20 2013 12/24/20 2326 12/25/20 0400 12/25/20 0408  BP:  (!) 108/44 132/77   Pulse: 61 (!) 58 60   Resp: 18 20 20    Temp:  97.6 F (36.4 C) 98.5 F (36.9 C)   TempSrc:  Oral Oral   SpO2: 100% 96% 95%   Weight:    50.6 kg  Height:      PainSc:  0-No pain 0-No pain     Intake/Output Summary (Last 24 hours) at 12/25/2020 0801 Last data filed at 12/25/2020 0527 Gross per 24 hour  Intake 420 ml  Output 150 ml  Net 270 ml   Filed Weights   12/23/20 0617 12/24/20 0406 12/25/20 0408  Weight: 48.7 kg 51.1 kg 50.6 kg   Weight change: -0.5 kg  Intake/Output  from previous day: 03/04 0701 - 03/05 0700 In: 420 [P.O.:420] Out: 150 [Urine:150] Intake/Output this shift: No intake/output data recorded. Filed Weights   12/23/20 0617 12/24/20 0406 12/25/20 0408  Weight: 48.7 kg 51.1 kg 50.6 kg    Examination: General exam: AA, elderly, not in acute distress, on room air.   HEENT:Oral mucosa moist, Ear/Nose WNL grossly, dentition normal. Respiratory system: bilaterally diminishedd,no wheezing or crackles,no use of accessory muscle Cardiovascular system: S1 & S2 +, No JVD,. Gastrointestinal system: Abdomen soft, NT,ND, BS+ Nervous System:Alert, awake, moving extremities and grossly nonfocal Extremities: No edema, distal peripheral pulses palpable.  Skin: No rashes,no icterus. MSK: Normal muscle bulk,tone, power    Data Reviewed: I have personally reviewed following labs and imaging studies CBC: Recent Labs  Lab 12/21/20 1143 12/22/20 0835 12/23/20 0214 12/24/20 0600 12/25/20 0235  WBC 16.0* 19.0* 13.1* 10.5 8.4  NEUTROABS  --   --  11.8*  --   --   HGB 15.9* 15.3* 14.5 12.9 13.0  HCT 46.5* 46.2* 42.2 38.1 38.8  MCV 91.4 93.0 91.5 92.3 92.6  PLT 179 166 153 148* 062   Basic Metabolic Panel: Recent Labs  Lab 12/21/20 0049 12/22/20 0835  12/23/20 0214 12/24/20 0600 12/25/20 0235  NA 136 134* 132* 131* 132*  K 3.9 3.4* 3.4* 4.3 3.8  CL 93* 94* 93* 99 101  CO2 29 28 26 23 22   GLUCOSE 124* 166* 115* 81 86  BUN 33* 42* 48* 53* 39*  CREATININE 1.31* 1.27* 1.79* 1.72* 1.10*  CALCIUM 10.2 10.3 10.2 9.3 9.4   GFR: Estimated Creatinine Clearance: 28.7 mL/min (A) (by C-G formula based on SCr of 1.1 mg/dL (H)). Liver Function Tests: No results for input(s): AST, ALT, ALKPHOS, BILITOT, PROT, ALBUMIN in the last 168 hours. No results for input(s): LIPASE, AMYLASE in the last 168 hours. No results for input(s): AMMONIA in the last 168 hours. Coagulation Profile: No results for input(s): INR, PROTIME in the last 168 hours. Cardiac Enzymes: No results for input(s): CKTOTAL, CKMB, CKMBINDEX, TROPONINI in the last 168 hours. BNP (last 3 results) No results for input(s): PROBNP in the last 8760 hours. HbA1C: No results for input(s): HGBA1C in the last 72 hours. CBG: No results for input(s): GLUCAP in the last 168 hours. Lipid Profile: No results for input(s): CHOL, HDL, LDLCALC, TRIG, CHOLHDL, LDLDIRECT in the last 72 hours. Thyroid Function Tests: No results for input(s): TSH, T4TOTAL, FREET4, T3FREE, THYROIDAB in the last 72 hours. Anemia Panel: No results for input(s): VITAMINB12, FOLATE, FERRITIN, TIBC, IRON, RETICCTPCT in the last 72 hours. Sepsis Labs: No results for input(s): PROCALCITON, LATICACIDVEN in the last 168 hours.  Recent Results (from the past 240 hour(s))  Resp Panel by RT-PCR (Flu A&B, Covid) Nasopharyngeal Swab     Status: None   Collection Time: 12/15/20  6:13 PM   Specimen: Nasopharyngeal Swab; Nasopharyngeal(NP) swabs in vial transport medium  Result Value Ref Range Status   SARS Coronavirus 2 by RT PCR NEGATIVE NEGATIVE Final    Comment: (NOTE) SARS-CoV-2 target nucleic acids are NOT DETECTED.  The SARS-CoV-2 RNA is generally detectable in upper respiratory specimens during the acute phase of  infection. The lowest concentration of SARS-CoV-2 viral copies this assay can detect is 138 copies/mL. A negative result does not preclude SARS-Cov-2 infection and should not be used as the sole basis for treatment or other patient management decisions. A negative result may occur with  improper specimen collection/handling, submission of specimen other than nasopharyngeal swab,  presence of viral mutation(s) within the areas targeted by this assay, and inadequate number of viral copies(<138 copies/mL). A negative result must be combined with clinical observations, patient history, and epidemiological information. The expected result is Negative.  Fact Sheet for Patients:  EntrepreneurPulse.com.au  Fact Sheet for Healthcare Providers:  IncredibleEmployment.be  This test is no t yet approved or cleared by the [place] FDA and  has been authorized for detection and/or diagnosis of SARS-CoV-2 by FDA under an Emergency Use Authorization (EUA). This EUA will remain  in effect (meaning this test can be used) for the duration of the COVID-19 declaration under Section 564(b)(1) of the Act, 21 U.S.C.section 360bbb-3(b)(1), unless the authorization is terminated  or revoked sooner.       Influenza A by PCR NEGATIVE NEGATIVE Final   Influenza B by PCR NEGATIVE NEGATIVE Final    Comment: (NOTE) The Xpert Xpress SARS-CoV-2/FLU/RSV plus assay is intended as an aid in the diagnosis of influenza from Nasopharyngeal swab specimens and should not be used as a sole basis for treatment. Nasal washings and aspirates are unacceptable for Xpert Xpress SARS-CoV-2/FLU/RSV testing.  Fact Sheet for Patients: EntrepreneurPulse.com.au  Fact Sheet for Healthcare Providers: IncredibleEmployment.be  This test is not yet approved or cleared by the [place] FDA and has been authorized for detection and/or diagnosis of SARS-CoV-2  by FDA under an Emergency Use Authorization (EUA). This EUA will remain in effect (meaning this test can be used) for the duration of the COVID-19 declaration under Section 564(b)(1) of the Act, 21 U.S.C. section 360bbb-3(b)(1), unless the authorization is terminated or revoked.  Performed at [facility], [place] [address] [place], [place] [address]   MRSA PCR Screening     Status: None   Collection Time: 12/16/20  3:20 PM   Specimen: Nasal Mucosa; Nasopharyngeal  Result Value Ref Range Status   MRSA by PCR NEGATIVE NEGATIVE Final    Comment:        The GeneXpert MRSA Assay (FDA approved for NASAL specimens only), is one component of a comprehensive MRSA colonization surveillance program. It is not intended to diagnose MRSA infection nor to guide or monitor treatment for MRSA infections. Performed at [facility], [place] [address], [place], [place] [address]      Radiology Studies: No results found.   LOS: 10 days   Antonieta Pert, MD [facility]  12/25/2020, 8:01 AM

## 2020-12-25 NOTE — Progress Notes (Signed)
Mobility Specialist: Progress Note   12/25/20 1534  Mobility  Activity Transferred to/from [facility];Ambulated in room  Level of Assistance Minimal assist, patient does 75% or more  Assistive Device Front wheel walker  Distance Ambulated (ft) 40 ft  Mobility Response Tolerated well  Mobility performed by Mobility specialist  Bed Position Chair  $Mobility charge 1 Mobility   Pre-Mobility: 69 HR, 123/80 BP, 97% SpO2 Post-Mobility: 72 HR, 100%  SpO2  Pt had a BM upon standing. Pt was transferred to [facility] to be assisted with pericare. After pericare pt agreeable to short walk in the room, asx. Pt to chair after walk with RN present in room.   [facility] Loeta Herst Mobility Specialist Mobility Specialist Phone: [phone]

## 2020-12-25 NOTE — TOC Progression Note (Addendum)
Transition of Care [facility]) - Progression Note    Patient Details  Name: [name] MRN: [id number] Date of Birth: Oct 19, 1934  Transition of Care [facility]) CM/SW [place], [place] Phone Number: 12/25/2020, 4:02 PM  Clinical Narrative:    CSW received a consult to contact pt's daughter [name] @ [phone] concerning possible SNF placement.  CSW attempted to contact pt's daughter.  CSW left a vm to return phone call.  TOC will continue to assist with disposition planning.  Update:  CSW spoke with pt's daughter [name] concerning disposition planning.  Pt's daughter chose [facility] for SNF choice and it has been updated in hub.  Pt's daughter would like to transport pt by car to SNF facility.  CSW will send a secure chat to physician relating to pt's daughter's question.  TOC will continue to assist with disposition planning.     Barriers to Discharge: Insurance Authorization,Continued Medical Work up  [employer] and Services   In-house Referral: Clinical Social Work     Living arrangements for the past 2 months: Single Family Home                                       Social Determinants of Health (SDOH) Interventions Food Insecurity Interventions: Intervention Not Indicated Financial Strain Interventions: Intervention Not Indicated Housing Interventions: Intervention Not Indicated Physical Activity Interventions: Intervention Not Indicated Transportation Interventions: Intervention Not Indicated Alcohol Brief Interventions/Follow-up: AUDIT Score <7 follow-up not indicated  Readmission Risk Interventions No flowsheet data found.

## 2020-12-25 NOTE — Progress Notes (Signed)
Social worker contacted and made aware of family member Truman Hayward Ann's) request to speak with Social Work regarding patient's discharge placement.

## 2020-12-25 NOTE — Progress Notes (Signed)
Progress Note  Patient Name: [name] Date of Encounter: 12/25/2020  [facility] Cardiologist: Donato Heinz, MD   Subjective   Fairly deconditioned, weak, tired.  Eating breakfast with assistance.  Inpatient Medications    Scheduled Meds: . aspirin EC  81 mg Oral QHS  . diclofenac Sodium  2 g Topical QID  . docusate sodium  100 mg Oral BID  . enoxaparin (LOVENOX) injection  30 mg Subcutaneous Q24H  . guaiFENesin  1,200 mg Oral BID  . hydrALAZINE  10 mg Oral Q8H  . ipratropium-albuterol  3 mL Nebulization BID  . isosorbide mononitrate  30 mg Oral Daily  . pantoprazole  40 mg Oral BID  . pravastatin  40 mg Oral q1800  . sodium chloride flush  3 mL Intravenous Q12H  . venlafaxine XR  75 mg Oral QPC breakfast  . vitamin B-12  100 mcg Oral Daily  . Vitamin D (Ergocalciferol)  50,000 Units Oral Q7 days   Continuous Infusions: . sodium chloride     PRN Meds: sodium chloride, acetaminophen, ALPRAZolam, benzonatate, ondansetron (ZOFRAN) IV, sodium chloride flush   Vital Signs    Vitals:   12/25/20 0400 12/25/20 0408 12/25/20 0805 12/25/20 0831  BP: 132/77   133/67  Pulse: 60   (!) 57  Resp: 20   16  Temp: 98.5 F (36.9 C)   97.8 F (36.6 C)  TempSrc: Oral   Oral  SpO2: 95%  100% 98%  Weight:  50.6 kg    Height:        Intake/Output Summary (Last 24 hours) at 12/25/2020 1039 Last data filed at 12/25/2020 0527 Gross per 24 hour  Intake 420 ml  Output 150 ml  Net 270 ml   Last 3 Weights 12/25/2020 12/24/2020 12/23/2020  Weight (lbs) 111 lb 8.8 oz 112 lb 10.5 oz 107 lb 5.8 oz  Weight (kg) 50.6 kg 51.1 kg 48.7 kg      Telemetry    Has demonstrated brief junctional rhythms occasionally heart rates in the 40s and 60s.- Personally Reviewed    Physical Exam   GEN: No acute distress.   Neck: No JVD Cardiac: RRR, no murmurs, rubs, or gallops.  Respiratory:  Mild crackles at bases bilaterally. GI: Soft, nontender, non-distended  MS: No edema; No  deformity. Neuro:  Nonfocal  Psych: Normal affect   Labs    High Sensitivity Troponin:   Recent Labs  Lab 12/15/20 1514  TROPONINIHS 58*      Chemistry Recent Labs  Lab 12/23/20 0214 12/24/20 0600 12/25/20 0235  NA 132* 131* 132*  K 3.4* 4.3 3.8  CL 93* 99 101  CO2 26 23 22   GLUCOSE 115* 81 86  BUN 48* 53* 39*  CREATININE 1.79* 1.72* 1.10*  CALCIUM 10.2 9.3 9.4  GFRNONAA 28* 29* 50*  ANIONGAP 13 9 9      Hematology Recent Labs  Lab 12/23/20 0214 12/24/20 0600 12/25/20 0235  WBC 13.1* 10.5 8.4  RBC 4.61 4.13 4.19  HGB 14.5 12.9 13.0  HCT 42.2 38.1 38.8  MCV 91.5 92.3 92.6  MCH 31.5 31.2 31.0  MCHC 34.4 33.9 33.5  RDW 15.8* 15.7* 15.7*  PLT 153 148* 170    BNPNo results for input(s): BNP, PROBNP in the last 168 hours.   DDimer No results for input(s): DDIMER in the last 168 hours.   Radiology    No results found.  Cardiac Studies    Echo 12/16/20 1. Left ventricular ejection fraction, by  estimation, is 25 to 30%. Left  ventricular ejection fraction by 3D volume is 31 %. The left ventricle has  severely decreased function. The left ventricle demonstrates global  hypokinesis. There is mild left  ventricular hypertrophy. Left ventricular diastolic parameters are  consistent with Grade I diastolic dysfunction (impaired relaxation).  2. Right ventricular systolic function is normal. The right ventricular  size is normal. There is mildly elevated pulmonary artery systolic  pressure.  3. Left atrial size was severely dilated.  4. The mitral valve is degenerative. Moderate mitral valve regurgitation.  No evidence of mitral stenosis. Moderate mitral annular calcification.  5. The aortic valve is calcified. Aortic valve regurgitation is trivial.  Mild aortic valve sclerosis is present, with no evidence of aortic valve  stenosis.  6. Aortic dilatation noted. There is mild dilatation of the ascending  aorta, measuring 39 mm.  7. The inferior vena  cava is normal in size with greater than 50%  respiratory variability, suggesting right atrial pressure of 3 mmHg.  R/LHC 12/17/20  Prox LAD to Mid LAD lesion is 20% stenosed.  Prox RCA lesion is 25% stenosed.  LV end diastolic pressure is mildly elevated.  Hemodynamic findings consistent with mild pulmonary hypertension.  1. Minimal nonobstructive CAD 2. Mildly elevated LVEDP 3. Mild pulmonary HTN. Mean PAP 22 mm Hg 4. Reduced cardiac output 2.6 L/min with index 1.72  Plan: medical management.  Patient Profile     87 y.o. female here with COPD, acute systolic diastolic heart failure hyponatremia acute kidney injury  Assessment & Plan    Acute systolic and diastolic heart failure -EF 25 to 30% secondary to nonischemic cardiomyopathy.  Acute kidney injury noted suspected secondary to intravascular volume depletion from diuresis. -BNP on admission was 3235.  Was on losartan 100 mg a day spironolactone 25 mg a day hydralazine isosorbide.  Currently we are holding both the losartan and spironolactone secondary to AKI. -Not currently on metoprolol succinate.  Possibly be able to use if blood pressure improves.  There was some issues with baseline bradycardia and occasional junctional rhythm.  Holding off. -Consider [place] Glade once kidney stabilize.  Would need to monitor renal function once this was started.  Probably will not need to standing Lasix.  Will likely use as needed for weight gain. -Creatinine was 0.8 on admission, peaked at 1.8.  Was given back some IV fluids.  Blood pressure improved as well. -Nonischemic cardiomyopathy work-up-TSH is unremarkable iron studies normal cortisol normal A1c 5.8 SPEP normal. -Does have basilar crackles similar to what she had yesterday.  Perhaps some scarring.  Remains on room air. -No further diuresis. -Continue to manage COPD per primary team. Mild thoracic aortic dilatation follow-up as outpatient.      For questions or updates, please  contact [place] Please consult [email or web address] for contact info under        Signed, Candee Furbish, MD  12/25/2020, 10:39 AM

## 2020-12-26 ENCOUNTER — Other Ambulatory Visit: Payer: Self-pay

## 2020-12-26 DIAGNOSIS — I5041 Acute combined systolic (congestive) and diastolic (congestive) heart failure: Secondary | ICD-10-CM | POA: Diagnosis not present

## 2020-12-26 LAB — CBC
HCT: 40.3 % (ref 36.0–46.0)
Hemoglobin: 13.5 g/dL (ref 12.0–15.0)
MCH: 31.3 pg (ref 26.0–34.0)
MCHC: 33.5 g/dL (ref 30.0–36.0)
MCV: 93.3 fL (ref 80.0–100.0)
Platelets: 192 10*3/uL (ref 150–400)
RBC: 4.32 MIL/uL (ref 3.87–5.11)
RDW: 15.3 % (ref 11.5–15.5)
WBC: 7.1 10*3/uL (ref 4.0–10.5)
nRBC: 0 % (ref 0.0–0.2)

## 2020-12-26 LAB — BASIC METABOLIC PANEL
Anion gap: 10 (ref 5–15)
BUN: 29 mg/dL — ABNORMAL HIGH (ref 8–23)
CO2: 26 mmol/L (ref 22–32)
Calcium: 9.7 mg/dL (ref 8.9–10.3)
Chloride: 100 mmol/L (ref 98–111)
Creatinine, Ser: 0.91 mg/dL (ref 0.44–1.00)
GFR, Estimated: >60 mL/min (ref >60)
Glucose, Bld: 123 mg/dL — ABNORMAL HIGH (ref 70–99)
Potassium: 3.7 mmol/L (ref 3.5–5.1)
Sodium: 136 mmol/L (ref 135–145)

## 2020-12-26 LAB — SARS CORONAVIRUS 2 (TAT 6-24 HRS): SARS Coronavirus 2: NEGATIVE

## 2020-12-26 MED ORDER — POTASSIUM CHLORIDE 20 MEQ PO PACK
40.0000 meq | PACK | Freq: Once | ORAL | Status: AC
  Administered 2020-12-26: 40 meq via ORAL
  Filled 2020-12-26: qty 2

## 2020-12-26 MED ORDER — LOSARTAN POTASSIUM 25 MG PO TABS
25.0000 mg | ORAL_TABLET | Freq: Every day | ORAL | Status: DC
  Administered 2020-12-26 – 2020-12-27 (×2): 25 mg via ORAL
  Filled 2020-12-26 (×2): qty 1

## 2020-12-26 MED ORDER — SPIRONOLACTONE 12.5 MG HALF TABLET
12.5000 mg | ORAL_TABLET | Freq: Every day | ORAL | Status: DC
  Administered 2020-12-26 – 2020-12-27 (×2): 12.5 mg via ORAL
  Filled 2020-12-26 (×2): qty 1

## 2020-12-26 MED ORDER — IPRATROPIUM-ALBUTEROL 0.5-2.5 (3) MG/3ML IN SOLN: 3.0000 mL | RESPIRATORY_TRACT | Status: DC | PRN

## 2020-12-26 NOTE — Progress Notes (Signed)
PROGRESS NOTE    [name]  JME:[id number] DOB: 06/12/1934 DOA: 12/15/2020 PCP: Hulan Fess, MD   Brief Narrative:  87 year old female with hypertension hyponatremia from HCTZ, COPD, anxiety/depression presents with new onset of shortness of breath with significantly elevated blood pressure found to have fluid overload chest x-ray with bilateral pleural effusion, hypokalemia, cardiologist consulted management IV Lasix underwent cardiac cath with no significant CAD but an EF of 25%. Patient is debilitated decondition PT OT working and will need a skilled nursing facility. Patient has been having uptrending creatinine.  Medication adjusted given IV fluids blood pressure at this time is stabilized. 3/5-creatinine improved 1.1  Assessment & Plan:  Acute combined systolic and diastolic CHF, NYHA class 4, LVEF 25 to 30% Nonischemic cardiomyopathy -status post cardiac cath this admission -BNP 3234 on admission. -Patient's renal function has improved after holding diuretics and IV fluids.   -Continue hydralazine, Imdur.  -Not on beta-blocker due to occasional junctional rhythm. -Strict INO's and daily weight. -Appreciate cardiology's recommendation  Nonobstructive CAD/hyperlipidemia: -Patient denies chest pain.   -Continue aspirin and statin  Mild thoracic aortic dilation: -follow-up as outpatient  AKI:  -In the setting of IV diuretics.  Kidney function has improved.  -cardiology has held Lasix losartan and Aldactone also received IV fluids.   -Creatinine: 0.19 this morning.  Monitor renal function closely.  Hypokalemia: Potassium 3.7. -Acid.  Repeat BMP tomorrow a.m.  Hypertension: Blood pressure stable now after stopping losartan and Lasix. -Medication management as per cardiology  COPD: Stable.  No wheezing noted on exam.   -Cont nebulizer, Mucinex, flutter valve   Hyperglycemia/prediabetes: A1c 5.8, diet modification advised  Vitamin D deficiency: continue  supplementation  Hyponatremia: Resolved  Leukocytosis: unclear etiology, resolved  Memory problems over last 6 months worse, B12 low normal continue to replete outpatient neurology follow-up will be needed.  Deconditioning/debility will continue to work with PT OT will need a skilled nursing facility once okay with cardiology  DVT prophylaxis: Lovenox Code Status: Full code Family Communication:  None present at bedside.  Plan of care discussed with patient in length and she verbalized understanding and agreed with it. Disposition Plan: SNF  Consultants:  Cardiology Procedures:   Cardiac cath  Antimicrobials:   None  Status is: Inpatient   Dispo: The patient is from: Home              Anticipated d/c is to: SNF              Patient currently is not medically stable to d/c.   Difficult to place patient No         Subjective: Patient seen and examined.  Sitting comfortably in the bed.  Tells me that her breathing has improved.  Denies chest pain, nausea, vomiting, abdominal pain, urinary or bowel changes.  Objective: Vitals:   12/26/20 0420 12/26/20 0724 12/26/20 0749 12/26/20 0810  BP:   140/69   Pulse:   66   Resp: 20 18 18 20   Temp:   98.7 F (37.1 C)   TempSrc:   Oral   SpO2:   96%   Weight:      Height:        Intake/Output Summary (Last 24 hours) at 12/26/2020 1135 Last data filed at 12/26/2020 0600 Gross per 24 hour  Intake --  Output 450 ml  Net -450 ml   Filed Weights   12/24/20 0406 12/25/20 0408 12/26/20 0417  Weight: 51.1 kg 50.6 kg 48.7 kg  Examination:  General exam: Appears calm and comfortable, on room air, elderly looking Caucasian female Respiratory system: Clear to auscultation. Respiratory effort normal. Cardiovascular system: S1 & S2 heard, RRR. No JVD, murmurs, rubs, gallops or clicks. No pedal edema. Gastrointestinal system: Abdomen is nondistended, soft and nontender. No organomegaly or masses felt. Normal bowel sounds  heard. Central nervous system: Alert and oriented. No focal neurological deficits. Extremities: Symmetric 5 x 5 power. Skin: No rashes, lesions or ulcers Psychiatry: Judgement and insight appear normal. Mood & affect appropriate.    Data Reviewed: I have personally reviewed following labs and imaging studies  CBC: Recent Labs  Lab 12/22/20 0835 12/23/20 0214 12/24/20 0600 12/25/20 0235 12/26/20 0311  WBC 19.0* 13.1* 10.5 8.4 7.1  NEUTROABS  --  11.8*  --   --   --   HGB 15.3* 14.5 12.9 13.0 13.5  HCT 46.2* 42.2 38.1 38.8 40.3  MCV 93.0 91.5 92.3 92.6 93.3  PLT 166 153 148* 170 026   Basic Metabolic Panel: Recent Labs  Lab 12/22/20 0835 12/23/20 0214 12/24/20 0600 12/25/20 0235 12/26/20 0311  NA 134* 132* 131* 132* 136  K 3.4* 3.4* 4.3 3.8 3.7  CL 94* 93* 99 101 100  CO2 28 26 23 22 26   GLUCOSE 166* 115* 81 86 123*  BUN 42* 48* 53* 39* 29*  CREATININE 1.27* 1.79* 1.72* 1.10* 0.91  CALCIUM 10.3 10.2 9.3 9.4 9.7   GFR: Estimated Creatinine Clearance: 34.7 mL/min (by C-G formula based on SCr of 0.91 mg/dL). Liver Function Tests: No results for input(s): AST, ALT, ALKPHOS, BILITOT, PROT, ALBUMIN in the last 168 hours. No results for input(s): LIPASE, AMYLASE in the last 168 hours. No results for input(s): AMMONIA in the last 168 hours. Coagulation Profile: No results for input(s): INR, PROTIME in the last 168 hours. Cardiac Enzymes: No results for input(s): CKTOTAL, CKMB, CKMBINDEX, TROPONINI in the last 168 hours. BNP (last 3 results) No results for input(s): PROBNP in the last 8760 hours. HbA1C: No results for input(s): HGBA1C in the last 72 hours. CBG: No results for input(s): GLUCAP in the last 168 hours. Lipid Profile: No results for input(s): CHOL, HDL, LDLCALC, TRIG, CHOLHDL, LDLDIRECT in the last 72 hours. Thyroid Function Tests: No results for input(s): TSH, T4TOTAL, FREET4, T3FREE, THYROIDAB in the last 72 hours. Anemia Panel: No results for  input(s): VITAMINB12, FOLATE, FERRITIN, TIBC, IRON, RETICCTPCT in the last 72 hours. Sepsis Labs: No results for input(s): PROCALCITON, LATICACIDVEN in the last 168 hours.  Recent Results (from the past 240 hour(s))  MRSA PCR Screening     Status: None   Collection Time: 12/16/20  3:20 PM   Specimen: Nasal Mucosa; Nasopharyngeal  Result Value Ref Range Status   MRSA by PCR NEGATIVE NEGATIVE Final    Comment:        The GeneXpert MRSA Assay (FDA approved for NASAL specimens only), is one component of a comprehensive MRSA colonization surveillance program. It is not intended to diagnose MRSA infection nor to guide or monitor treatment for MRSA infections. Performed at [facility], [place] [address] [place], [place] [address]       Radiology Studies: No results found.  Scheduled Meds: . aspirin EC  81 mg Oral QHS  . diclofenac Sodium  2 g Topical QID  . docusate sodium  100 mg Oral BID  . enoxaparin (LOVENOX) injection  30 mg Subcutaneous Q24H  . guaiFENesin  1,200 mg Oral BID  . hydrALAZINE  10  mg Oral Q8H  . ipratropium-albuterol  3 mL Nebulization BID  . isosorbide mononitrate  30 mg Oral Daily  . pantoprazole  40 mg Oral BID  . pravastatin  40 mg Oral q1800  . sodium chloride flush  3 mL Intravenous Q12H  . venlafaxine XR  75 mg Oral QPC breakfast  . vitamin B-12  100 mcg Oral Daily  . Vitamin D (Ergocalciferol)  50,000 Units Oral Q7 days   Continuous Infusions: . sodium chloride       LOS: 11 days   Time spent: 35 minutes  Gokul Waybright Loann Quill, MD [facility]  If 7PM-7AM, please contact night-coverage [email or web address] 12/26/2020, 11:35 AM

## 2020-12-26 NOTE — TOC Progression Note (Signed)
Transition of Care [facility]) - Progression Note    Patient Details  Name: [name] MRN: [id number] Date of Birth: Aug 06, 1934  Transition of Care [facility]) CM/SW [place], [place] Phone Number: [phone] 12/26/2020, 12:02 PM  Clinical Narrative:     CSW started patient's authorizaton with a start date of 12/27/2020. Clinicals faxed and auth reference # [id number].  CSW spoke with Juliann Pulse form [facility] and she confirmed that daughter could transport patient if she would like and that new COVID was needed. CSW alerted MD a new COVID request.  TOC team will continue to assist with discharge planning needs.    Barriers to Discharge: Insurance Authorization,Continued Medical Work up  [employer] and Services   In-house Referral: Clinical Social Work     Living arrangements for the past 2 months: Single Family Home                                       Social Determinants of Health (SDOH) Interventions Food Insecurity Interventions: Intervention Not Indicated Financial Strain Interventions: Intervention Not Indicated Housing Interventions: Intervention Not Indicated Physical Activity Interventions: Intervention Not Indicated Transportation Interventions: Intervention Not Indicated Alcohol Brief Interventions/Follow-up: AUDIT Score <7 follow-up not indicated  Readmission Risk Interventions No flowsheet data found.

## 2020-12-26 NOTE — Progress Notes (Signed)
Mobility Specialist: Progress Note   12/26/20 1548  Mobility  Activity Ambulated in room;Transferred to/from BSC  Level of Assistance Minimal assist, patient does 75% or more  Assistive Device Front wheel walker  Distance Ambulated (ft) 20 ft (10'x2)  Mobility Response Tolerated fair  Mobility performed by Mobility specialist  Bed Position Chair  $Mobility charge 1 Mobility   Post-Mobility: 71 HR, 100% SpO2  Pt requested to go to BR before walking. Pt willing to walk to BR instead of use BSC. Pt was unable to make it to BR due to BM so BSC was placed for pt to use. Pt assisted with pericare and is sitting in the chair, RN notified.   [facility] Marckus Hanover Mobility Specialist Mobility Specialist Phone: [phone]

## 2020-12-26 NOTE — Progress Notes (Signed)
Progress Note  Patient Name: [name] Date of Encounter: 12/26/2020  [facility] Cardiologist: Donato Heinz, MD   Subjective   Still quite weak.  Needing assistance.  Inpatient Medications    Scheduled Meds: . aspirin EC  81 mg Oral QHS  . diclofenac Sodium  2 g Topical QID  . docusate sodium  100 mg Oral BID  . enoxaparin (LOVENOX) injection  30 mg Subcutaneous Q24H  . guaiFENesin  1,200 mg Oral BID  . hydrALAZINE  10 mg Oral Q8H  . ipratropium-albuterol  3 mL Nebulization BID  . isosorbide mononitrate  30 mg Oral Daily  . pantoprazole  40 mg Oral BID  . potassium chloride  40 mEq Oral Once  . pravastatin  40 mg Oral q1800  . sodium chloride flush  3 mL Intravenous Q12H  . venlafaxine XR  75 mg Oral QPC breakfast  . vitamin B-12  100 mcg Oral Daily  . Vitamin D (Ergocalciferol)  50,000 Units Oral Q7 days   Continuous Infusions: . sodium chloride     PRN Meds: sodium chloride, acetaminophen, ALPRAZolam, benzonatate, ondansetron (ZOFRAN) IV, sodium chloride flush   Vital Signs    Vitals:   12/26/20 0420 12/26/20 0724 12/26/20 0749 12/26/20 0810  BP:   140/69   Pulse:   66   Resp: 20 18 18 20   Temp:   98.7 F (37.1 C)   TempSrc:   Oral   SpO2:   96%   Weight:      Height:        Intake/Output Summary (Last 24 hours) at 12/26/2020 1206 Last data filed at 12/26/2020 0600 Gross per 24 hour  Intake --  Output 450 ml  Net -450 ml   Last 3 Weights 12/26/2020 12/25/2020 12/24/2020  Weight (lbs) 107 lb 5.8 oz 111 lb 8.8 oz 112 lb 10.5 oz  Weight (kg) 48.7 kg 50.6 kg 51.1 kg      Telemetry    Prior junctional rhythm noted with heart rates occasionally in the 40s and 60s- Personally Reviewed    Physical Exam   GEN: Well nourished, well developed, in no acute distress  HEENT: normal  Neck: no JVD, carotid bruits, or masses Cardiac: RRR; no murmurs, rubs, or gallops,no edema  Respiratory: Mild crackles at base bilaterally GI: soft, nontender,  nondistended, + BS MS: no deformity or atrophy  Skin: warm and dry, no rash Neuro:  Alert and Oriented x 3, Strength and sensation are intact Psych: euthymic mood, full affect   Labs    High Sensitivity Troponin:   Recent Labs  Lab 12/15/20 1514  TROPONINIHS 58*      Chemistry Recent Labs  Lab 12/24/20 0600 12/25/20 0235 12/26/20 0311  NA 131* 132* 136  K 4.3 3.8 3.7  CL 99 101 100  CO2 23 22 26   GLUCOSE 81 86 123*  BUN 53* 39* 29*  CREATININE 1.72* 1.10* 0.91  CALCIUM 9.3 9.4 9.7  GFRNONAA 29* 50* >60  ANIONGAP 9 9 10      Hematology Recent Labs  Lab 12/24/20 0600 12/25/20 0235 12/26/20 0311  WBC 10.5 8.4 7.1  RBC 4.13 4.19 4.32  HGB 12.9 13.0 13.5  HCT 38.1 38.8 40.3  MCV 92.3 92.6 93.3  MCH 31.2 31.0 31.3  MCHC 33.9 33.5 33.5  RDW 15.7* 15.7* 15.3  PLT 148* 170 192    BNPNo results for input(s): BNP, PROBNP in the last 168 hours.   DDimer No results for input(s):  DDIMER in the last 168 hours.   Radiology    No results found.  Cardiac Studies    Echo 12/16/20 1. Left ventricular ejection fraction, by estimation, is 25 to 30%. Left  ventricular ejection fraction by 3D volume is 31 %. The left ventricle has  severely decreased function. The left ventricle demonstrates global  hypokinesis. There is mild left  ventricular hypertrophy. Left ventricular diastolic parameters are  consistent with Grade I diastolic dysfunction (impaired relaxation).  2. Right ventricular systolic function is normal. The right ventricular  size is normal. There is mildly elevated pulmonary artery systolic  pressure.  3. Left atrial size was severely dilated.  4. The mitral valve is degenerative. Moderate mitral valve regurgitation.  No evidence of mitral stenosis. Moderate mitral annular calcification.  5. The aortic valve is calcified. Aortic valve regurgitation is trivial.  Mild aortic valve sclerosis is present, with no evidence of aortic valve  stenosis.   6. Aortic dilatation noted. There is mild dilatation of the ascending  aorta, measuring 39 mm.  7. The inferior vena cava is normal in size with greater than 50%  respiratory variability, suggesting right atrial pressure of 3 mmHg.  R/LHC 12/17/20  Prox LAD to Mid LAD lesion is 20% stenosed.  Prox RCA lesion is 25% stenosed.  LV end diastolic pressure is mildly elevated.  Hemodynamic findings consistent with mild pulmonary hypertension.  1. Minimal nonobstructive CAD 2. Mildly elevated LVEDP 3. Mild pulmonary HTN. Mean PAP 22 mm Hg 4. Reduced cardiac output 2.6 L/min with index 1.72  Plan: medical management.  Patient Profile     87 y.o. female here with COPD, acute systolic diastolic heart failure hyponatremia acute kidney injury  Assessment & Plan    Acute systolic and diastolic heart failure -EF 25 to 30% secondary to nonischemic cardiomyopathy.  Acute kidney injury noted suspected secondary to intravascular volume depletion from diuresis. -BNP on admission was 3235.  Was on losartan 100 mg a day spironolactone 25 mg a day hydralazine isosorbide.  Currently we are holding both the losartan and spironolactone secondary to AKI. -I think would be reasonable now to add back losartan 25 mg as well as spironolactone 12.5 mg a day given her marked improvement. -Kidney function has improved significantly.  Continue to monitor. -Not currently on metoprolol succinate.  Possibly be able to use if blood pressure improves.  There was some issues with baseline bradycardia and occasional junctional rhythm.  Holding off. -Consider Wilder Glade.   Would need to monitor renal function once this was started.  Probably will not need to standing Lasix.  Will likely use as needed for weight gain. -Creatinine was 0.8 on admission, peaked at 1.8.  Was given back some IV fluids.  Blood pressure improved as well. -Nonischemic cardiomyopathy work-up-TSH is unremarkable iron studies normal cortisol  normal A1c 5.8 SPEP normal. -Does have basilar crackles similar to what she had yesterday.  Perhaps some scarring.  Remains on room air. -No further diuresis. -Continue to manage COPD per primary team. Mild thoracic aortic dilatation follow-up as outpatient.  Appears stable for discharge from a cardiac perspective.  Spoke with her son.  For questions or updates, please contact [place] Please consult [email or web address] for contact info under        Signed, Candee Furbish, MD  12/26/2020, 12:06 PM

## 2020-12-26 NOTE — TOC Transition Note (Deleted)
Transition of Care [facility]) - CM/SW Discharge Note   Patient Details  Name: [name] MRN: [id number] Date of Birth: [birth year]  Transition of Care [facility]) CM/SW Contact:  Bary Castilla, LCSW Phone Number: [phone] 12/26/2020, 11:47 AM   Clinical Narrative:     Patient will DC to:?[facility] Anticipated DC date:?12/26/2020 Family notified:?[place] Transport by: Corey Harold   Per MD patient ready for DC to [facility]. RN, patient, patient's family, and facility notified of DC. Discharge Summary sent to facility. RN given number for report [phone] room 122b. DC packet on chart and facility asked DC summary to be sent by patient as well. Ambulance transport requested for patient. CSW called [facility] at [phone] about patient's discharge today.   CSW signing off.   [place], [place] [phone]   Final next level of care: Skilled Nursing Facility Barriers to Discharge: Insurance Authorization,Continued Medical Work up   Patient Goals and CMS Choice        Discharge Placement                       Discharge Plan and Services In-house Referral: Clinical Social Work                                   Social Determinants of Health (SDOH) Interventions Food Insecurity Interventions: Intervention Not Indicated Financial Strain Interventions: Intervention Not Indicated Housing Interventions: Intervention Not Indicated Physical Activity Interventions: Intervention Not Indicated Transportation Interventions: Intervention Not Indicated Alcohol Brief Interventions/Follow-up: AUDIT Score <7 follow-up not indicated   Readmission Risk Interventions No flowsheet data found.

## 2020-12-27 ENCOUNTER — Encounter (HOSPITAL_COMMUNITY): Payer: Self-pay

## 2020-12-27 DIAGNOSIS — R7303 Prediabetes: Secondary | ICD-10-CM | POA: Diagnosis not present

## 2020-12-27 DIAGNOSIS — I5041 Acute combined systolic (congestive) and diastolic (congestive) heart failure: Secondary | ICD-10-CM | POA: Diagnosis not present

## 2020-12-27 DIAGNOSIS — J449 Chronic obstructive pulmonary disease, unspecified: Secondary | ICD-10-CM | POA: Diagnosis not present

## 2020-12-27 DIAGNOSIS — N179 Acute kidney failure, unspecified: Secondary | ICD-10-CM | POA: Diagnosis not present

## 2020-12-27 DIAGNOSIS — E559 Vitamin D deficiency, unspecified: Secondary | ICD-10-CM | POA: Diagnosis not present

## 2020-12-27 DIAGNOSIS — E785 Hyperlipidemia, unspecified: Secondary | ICD-10-CM | POA: Diagnosis not present

## 2020-12-27 DIAGNOSIS — D649 Anemia, unspecified: Secondary | ICD-10-CM | POA: Diagnosis not present

## 2020-12-27 DIAGNOSIS — I429 Cardiomyopathy, unspecified: Secondary | ICD-10-CM | POA: Diagnosis not present

## 2020-12-27 DIAGNOSIS — I5042 Chronic combined systolic (congestive) and diastolic (congestive) heart failure: Secondary | ICD-10-CM | POA: Diagnosis not present

## 2020-12-27 DIAGNOSIS — M6281 Muscle weakness (generalized): Secondary | ICD-10-CM | POA: Diagnosis not present

## 2020-12-27 DIAGNOSIS — I2510 Atherosclerotic heart disease of native coronary artery without angina pectoris: Secondary | ICD-10-CM | POA: Diagnosis not present

## 2020-12-27 DIAGNOSIS — M1711 Unilateral primary osteoarthritis, right knee: Secondary | ICD-10-CM | POA: Diagnosis not present

## 2020-12-27 DIAGNOSIS — R739 Hyperglycemia, unspecified: Secondary | ICD-10-CM | POA: Diagnosis not present

## 2020-12-27 DIAGNOSIS — I10 Essential (primary) hypertension: Secondary | ICD-10-CM | POA: Diagnosis not present

## 2020-12-27 DIAGNOSIS — M810 Age-related osteoporosis without current pathological fracture: Secondary | ICD-10-CM | POA: Diagnosis not present

## 2020-12-27 LAB — BASIC METABOLIC PANEL
Anion gap: 12 (ref 5–15)
BUN: 27 mg/dL — ABNORMAL HIGH (ref 8–23)
CO2: 23 mmol/L (ref 22–32)
Calcium: 10.4 mg/dL — ABNORMAL HIGH (ref 8.9–10.3)
Chloride: 98 mmol/L (ref 98–111)
Creatinine, Ser: 0.82 mg/dL (ref 0.44–1.00)
GFR, Estimated: >60 mL/min (ref >60)
Glucose, Bld: 96 mg/dL (ref 70–99)
Potassium: 4.6 mmol/L (ref 3.5–5.1)
Sodium: 133 mmol/L — ABNORMAL LOW (ref 135–145)

## 2020-12-27 LAB — MAGNESIUM: Magnesium: 1.8 mg/dL (ref 1.7–2.4)

## 2020-12-27 MED ORDER — ASPIRIN 81 MG PO TBEC: 81.0000 mg | DELAYED_RELEASE_TABLET | Freq: Every day | ORAL | 11 refills | Status: DC

## 2020-12-27 MED ORDER — ISOSORBIDE MONONITRATE ER 30 MG PO TB24: 30.0000 mg | ORAL_TABLET | Freq: Every day | ORAL | 0 refills | Status: DC

## 2020-12-27 MED ORDER — GUAIFENESIN ER 600 MG PO TB12: 1200.0000 mg | ORAL_TABLET | Freq: Two times a day (BID) | ORAL | 0 refills | Status: DC

## 2020-12-27 MED ORDER — HYDRALAZINE HCL 10 MG PO TABS: 10.0000 mg | ORAL_TABLET | Freq: Three times a day (TID) | ORAL | 0 refills | Status: DC

## 2020-12-27 MED ORDER — LOSARTAN POTASSIUM 25 MG PO TABS: 25.0000 mg | ORAL_TABLET | Freq: Every day | ORAL | 0 refills | Status: DC

## 2020-12-27 MED ORDER — CYANOCOBALAMIN 100 MCG PO TABS: 100.0000 ug | ORAL_TABLET | Freq: Every day | ORAL | 0 refills | Status: DC

## 2020-12-27 MED ORDER — SPIRONOLACTONE 25 MG PO TABS: 12.5000 mg | ORAL_TABLET | Freq: Every day | ORAL | 0 refills | Status: DC

## 2020-12-27 MED ORDER — MAGNESIUM SULFATE 2 GM/50ML IV SOLN
2.0000 g | Freq: Once | INTRAVENOUS | Status: AC
  Administered 2020-12-27: 2 g via INTRAVENOUS
  Filled 2020-12-27: qty 50

## 2020-12-27 MED ORDER — DAPAGLIFLOZIN PROPANEDIOL 10 MG PO TABS: 10.0000 mg | ORAL_TABLET | Freq: Every day | ORAL | 0 refills | Status: DC

## 2020-12-27 NOTE — Care Management Important Message (Signed)
Important Message  Patient Details  Name: LILE MCCURLEY MRN: [id number] Date of Birth: [birth year]   Medicare Important Message Given:  Yes     Shelda Altes 12/27/2020, 10:06 AM

## 2020-12-27 NOTE — TOC Progression Note (Signed)
Transition of Care [facility]) - Progression Note    Patient Details  Name: [name] MRN: [id number] Date of Birth: [birth year]  Transition of Care [facility]) CM/SW [place], [place] Phone Number: 12/27/2020, 12:41 PM  Clinical Narrative:     Insurance remains pending- CSW verbally provided updated PT information-CSW waiting on decision.   Thurmond Butts, MSW, LCSW Clinical Social Worker     Barriers to Discharge: Insurance Authorization,Continued Medical Work up  [employer] and Services   In-house Referral: Clinical Social Work     Living arrangements for the past 2 months: Single Family Home                                       Social Determinants of Health (SDOH) Interventions Food Insecurity Interventions: Intervention Not Indicated Financial Strain Interventions: Intervention Not Indicated Housing Interventions: Intervention Not Indicated Physical Activity Interventions: Intervention Not Indicated Transportation Interventions: Intervention Not Indicated Alcohol Brief Interventions/Follow-up: AUDIT Score <7 follow-up not indicated  Readmission Risk Interventions No flowsheet data found.

## 2020-12-27 NOTE — Progress Notes (Signed)
Heart Failure Stewardship Pharmacist Progress Note   PCP: Hulan Fess, MD PCP-Cardiologist: Donato Heinz, MD    HPI:  87 yo F with PMH of HTN, HLD, COPD, squamous cell skin cancer, anxiety/depression, and hypokalemia. She presented to the ED on 12/15/20 with shortness of breath and fluid overload. Admitted for acute combined systolic and diastolic HF. An ECHO was done on 12/16/20 and LVEF is 25-30%. R/LHC done on 12/17/20 and found to have minimal nonobstructive CAD and mildly elevated filling pressures.    Current HF Medications: Losartan 25 mg daily Hydralazine 10 mg q8h Imdur 30 mg daily Spironolactone 12.5 mg daily  Prior to admission HF Medications: Losartan 50 mg daily  Pertinent Lab Values: . Serum creatinine 0.82, BUN 27, Potassium 4.6, Sodium 133, BNP 3235, Magnesium 1.8  Vital Signs: . Weight: 106 lbs (admission weight: 118 lbs) . Blood pressure: 110-140/60s . Heart rate: 60s   Medication Assistance / Insurance Benefits Check: Does the patient have prescription insurance?  Yes Type of insurance plan: UHC Medicare  Does the patient qualify for medication assistance through manufacturers or grants?   Pending . Eligible grants and/or patient assistance programs: pending . Medication assistance applications in progress: none  . Medication assistance applications approved: none  Approved medication assistance renewals will be completed by: pending  Outpatient Pharmacy:  Prior to admission outpatient pharmacy: [facility] Is the patient willing to use [facility] at discharge? Yes  Assessment: 1. Acute on chronic systolic CHF (EF 22-48%), due to NICM. NYHA class III symptoms. - Off metoprolol XL with baseline bradycardia and occasional junctional rhythm - Continue losartan 25 mg daily - Continue spironolactone 12.5 mg daily - Consider starting Farxiga 10 mg daily prior to discharge if SCr allows. Minimal to no impact on BP.  - Continue hydralazine  10 mg q8h  - Continue Imdur 30 mg daily   Plan: 1) Medication changes recommended at this time: - Continue current regimen; may be able to start [place] tomorrow if renal function is stable  2) Patient assistance application(s): - Entresto copay $47 per month - Farxiga copay $47 per month - Can help complete patient assistance applications to lower copay to $0 per month for both prescriptions  3)  Education  - To be completed prior to discharge  Kerby Nora, PharmD, BCPS Heart Failure Stewardship Pharmacist Phone [phone]

## 2020-12-27 NOTE — Progress Notes (Signed)
Physical Therapy Treatment Patient Details Name: [name] MRN: [id number] DOB: 04-19-1934 Today's Date: 12/27/2020    History of Present Illness Pt is an 87 y/o female admitted secondary to SOB and BLE swelling. Thought to be secondary to acute CHF. PMH includes HTN and COPD.    PT Comments    Pt received up on [facility] with nursing tech assisting her to dress, pt agreeable to therapy session and with good participation and tolerance for mobility. Pt able to progress gait distance to 67ft then 72ft with standing break between bouts at sink, pt using RW and mostly min guard assist, up to minA needed at times. Pt pulling up to 1100 on IS and with improved activity tolerance on RA this date with VSS. Pt remained up in chair at end of session with chair alarm activated and briefs donned. Will plan to initiate stair training next session if appropriate. Pt continues to benefit from PT services to progress toward functional mobility goals. Continue to recommend SNF.  Follow Up Recommendations  SNF;Supervision/Assistance - 24 hour     Equipment Recommendations  None recommended by PT    Recommendations for Other Services       Precautions / Restrictions Precautions Precautions: Fall Precaution Comments: watch SpO2 Restrictions Weight Bearing Restrictions: No    Mobility  Bed Mobility Overal bed mobility: Needs Assistance             General bed mobility comments: pt received up on BSC with NT present    Transfers Overall transfer level: Needs assistance Equipment used: Rolling walker (2 wheeled) Transfers: Sit to/from Stand Sit to Stand: Min assist Stand pivot transfers: Min assist       General transfer comment: vc for safe hand placement with RW for power up to standing from bed and from [facility] heights; decreased eccentric control to sit  Ambulation/Gait Ambulation/Gait assistance: Min assist;Min guard Gait Distance (Feet): 120 Feet (including 2 standing breaks at  sink/RW) Assistive device: Rolling walker (2 wheeled) Gait Pattern/deviations: Step-to pattern;Step-through pattern;Decreased stride length;Trunk flexed (decreased B foot clearance) Gait velocity: grossly <0.2 m/s   General Gait Details: pt SpO2 WNL on RA, HR 78-94 bpm during mobility, mostly min guard but up to minA when navigating narrow spaces in room; no LOB   Stairs      Wheelchair Mobility    Modified Rankin (Stroke Patients Only)       Balance Overall balance assessment: Needs assistance Sitting-balance support: Feet supported;No upper extremity supported Sitting balance-Leahy Scale: Good     Standing balance support: Bilateral upper extremity supported;During functional activity Standing balance-Leahy Scale: Poor Standing balance comment: Reliant on BUE support, able to stand at sink to wash hands with supervision but using hips to steady at sink         Cognition Arousal/Alertness: Awake/alert Behavior During Therapy: WFL for tasks assessed/performed Overall Cognitive Status: Impaired/Different from baseline Area of Impairment: Orientation;Problem solving;Safety/judgement        Orientation Level: Disoriented to;Place   Memory: Decreased short-term memory   Safety/Judgement: Decreased awareness of safety   Problem Solving: Slow processing General Comments: pt able to state name/month/day of week and year, able to state hospital but says "[place]", pt reoriented to . cooperative but slow to process some cues      Exercises Other Exercises Other Exercises: BLE AROM: quad sets, ankle pumps 1x5 reps, cued to perform hourly; IS x5 reps varying [phone]    General Comments General comments (skin integrity, edema, etc.):  SpO2 WNL on RA, HR 70's-90's bpm, not dyspneic; BLE elevated in recliner at end of session with pillow under calves/heels floated      Pertinent Vitals/Pain Pain Assessment: No/denies pain Pain Intervention(s): Monitored  during session;Repositioned    Home Living   Prior Function    PT Goals (current goals can now be found in the care plan section) Acute Rehab PT Goals Patient Stated Goal: to feel better PT Goal Formulation: With patient Time For Goal Achievement: 12/30/20 Potential to Achieve Goals: Good Progress towards PT goals: Progressing toward goals    Frequency    Min 3X/week      PT Plan Current plan remains appropriate    Co-evaluation              AM-PAC PT "6 Clicks" Mobility   Outcome Measure  Help needed turning from your back to your side while in a flat bed without using bedrails?: None Help needed moving from lying on your back to sitting on the side of a flat bed without using bedrails?: A Little Help needed moving to and from a bed to a chair (including a wheelchair)?: A Little Help needed standing up from a chair using your arms (e.g., wheelchair or bedside chair)?: A Little Help needed to walk in hospital room?: A Little Help needed climbing 3-5 steps with a railing? : A Little 6 Click Score: 19    End of Session Equipment Utilized During Treatment: Gait belt Activity Tolerance: Patient tolerated treatment well Patient left: in chair;with chair alarm set;with call bell/phone within reach (briefs remained donned in chair due to hx incontinence) Nurse Communication: Mobility status PT Visit Diagnosis: Other abnormalities of gait and mobility (R26.89);Muscle weakness (generalized) (M62.81)     Time: 4540-9811 PT Time Calculation (min) (ACUTE ONLY): 22 min  Charges:  $Gait Training: 8-22 mins                     Ketih Goodie P., PTA Acute Rehabilitation Services Pager: [phone] Office: [place] 12/27/2020, 11:17 AM

## 2020-12-27 NOTE — Progress Notes (Signed)
Mobility Specialist: Progress Note   12/27/20 1541  Mobility  Activity Ambulated in hall  Level of Assistance Minimal assist, patient does 75% or more  Assistive Device Front wheel walker  Distance Ambulated (ft) 112 ft  Mobility Response Tolerated well  Mobility performed by Mobility specialist  $Mobility charge 1 Mobility   Post-Mobility: 79 HR  Pt asx during ambulation. Pt to BR after walk, RN present in room.   [facility] Mobility Specialist Mobility Specialist Phone: [phone]

## 2020-12-27 NOTE — Progress Notes (Signed)
Pt discharged to [facility]. IVs removed. Telemetry box removed. Pt left with all of her belongings. Pt transported to facility by pt's son.

## 2020-12-27 NOTE — Progress Notes (Signed)
Attempted report to [facility].

## 2020-12-27 NOTE — TOC Progression Note (Signed)
Transition of Care [facility]) - Progression Note    Patient Details  Name: [name] MRN: [id number] Date of Birth: 12-Jan-1934  Transition of Care [facility] CM/SW [place], [place] Phone Number: 12/27/2020, 1:50 PM  Clinical Narrative:     CSW received insurance authorization reference # [id number]  Thurmond Butts, MSW, LCSW Clinical Social Worker     Barriers to Discharge: Insurance Authorization,Continued Medical Work up  [employer] and Services   In-house Referral: Clinical Social Work     Living arrangements for the past 2 months: Single Family Home                                       Social Determinants of Health (SDOH) Interventions Food Insecurity Interventions: Intervention Not Indicated Financial Strain Interventions: Intervention Not Indicated Housing Interventions: Intervention Not Indicated Physical Activity Interventions: Intervention Not Indicated Transportation Interventions: Intervention Not Indicated Alcohol Brief Interventions/Follow-up: AUDIT Score <7 follow-up not indicated  Readmission Risk Interventions No flowsheet data found.

## 2020-12-27 NOTE — TOC Transition Note (Signed)
Transition of Care [facility]) - CM/SW Discharge Note   Patient Details  Name: [name] MRN: [id number] Date of Birth: June 24, 1934  [facility] Care [facility]) CM/SW Contact:  Vinie Sill, [place] Phone Number: 12/27/2020, 2:04 PM   Clinical Narrative:     Patient will Discharge to: [facility]  Discharge Date: 03/07/222 Family Notified: Benny(son) & [name](daughter) Transport By: private car- family will transport   Per MD patient is ready for discharge. RN, patient, and facility notified of DC. Discharge Summary sent to facility. RN given number for [phone],Room 120.  Clinical Social Worker signing off. Family will provide transportation.  Thurmond Butts, MSW, LCSW Clinical Social Worker    Final next level of care: Skilled Nursing Facility Barriers to Discharge: Barriers Resolved   Patient Goals and CMS Choice        Discharge Placement PASRR number recieved: 12/22/20            Patient chooses bed at: [facility] Patient to be transferred to facility by: family will transport Name of family member notified: [place] Reek and [place] Patient and family notified of of transfer: 12/27/20  Discharge Plan and Services In-house Referral: Clinical Social Work                                   Social Determinants of Health (SDOH) Interventions Food Insecurity Interventions: Intervention Not Indicated Financial Strain Interventions: Intervention Not Indicated Housing Interventions: Intervention Not Indicated Physical Activity Interventions: Intervention Not Indicated Transportation Interventions: Intervention Not Indicated Alcohol Brief Interventions/Follow-up: AUDIT Score <7 follow-up not indicated   Readmission Risk Interventions No flowsheet data found.

## 2020-12-27 NOTE — Discharge Summary (Addendum)
Physician Discharge Summary  [name] WUJ:[id number] DOB: [birth year] DOA: 12/15/2020  PCP: Hulan Fess, MD  Admit date: 12/15/2020 Discharge date: 12/27/2020  Admitted From: Home Disposition:  SNF  Recommendations for Outpatient Follow-up:  Follow-up with PCP in 1 to 2 weeks Please repeat CBC, BMP and magnesium level on follow-up visit Take medications as prescribed Follow-up with cardiology as an outpatient  Home Health: none Equipment/Devices:none  Discharge Condition:stable  CODE STATUS:full code  Diet recommendation:   Brief/Interim Summary: 87 year old female with hypertension hyponatremia from HCTZ, COPD, anxiety/depression presents with new onset of shortness of breath with significantly elevated blood pressure found to have fluid overload chest x-ray with bilateral pleural effusion, hypokalemia, cardiologist consulted management IV Lasix underwent cardiac cath with no significant CAD but an EF of 25%. Patient is debilitated decondition PT OT recommended SNF.  Acute combined systolic and diastolic CHF, NYHA class 4, LVEF 25 to 30% Nonischemic cardiomyopathy -status post cardiac cath this admission -BNP 3234 on admission. -Not on beta-blocker due to occasional junctional rhythm. -Appreciate cardiology's recommendation -we will discharge patient on hydralazine 10 mg every 8 hours, losartan 25 mg daily, Aldactone 12.5 mg daily, Imdur 30 mg daily Discussed with Dr. Marlou Porch via secure chat-no Lasix or  Farxiga at this time -Follow-up with cardiology outpatient  Nonobstructive CAD/hyperlipidemia: -Patient remained chest pain-free   -Continued aspirin and statin  Mild thoracic aortic dilation: -follow-up as outpatient recommended.  AKI:  -In the setting of IV diuretics.   -cardiology has held Lasix losartan and Aldactone also received IV fluids.   -Renal function improved-cardiology resumed losartan and Aldactone and her kidney function remained  stable.  Hypokalemia: Resolved  Hypertension:  Blood pressure remained stable on hydralazine, Imdur, losartan and Aldactone.  COPD: Stable.  No wheezing noted on exam.   -Contd nebulizer, Mucinex, flutter valve   Hyperglycemia/prediabetes: A1c 5.8, diet modification advised  Vitamin D deficiency: continued supplementation  Hyponatremia: Resolved  Leukocytosis: unclear etiology, resolved  Hypomagnesemia: Magnesium 1.8.  Replenished.  Repeat magnesium level on follow-up visit with PCP/cardiology  Memory problems over last 6 months worse, B12 low normal continue to replete outpatient neurology follow-up will be needed.  Deconditioning/debility: PT/OT recommended SNF  Patient stable for the discharge to nursing home today.  I called patient's daughter and discussed plan of care and she verbalized understanding.  Discharge Diagnoses:  Acute combined systolic and diastolic CHF with ejection fraction of 25 to 30% Nonischemic cardiomyopathy Nonobstructive CAD/hyperlipidemia Mild thoracic aortic dilation AKI Hypokalemia Hypertension COPD Hyperglycemia/prediabetes Vitamin D deficiency Hyponatremia Leukocytosis Memory problems Deconditioning/debility Hypomagnesemia   Discharge Instructions  Discharge Instructions    Diet - low sodium heart healthy   Complete by: As directed    Discharge instructions   Complete by: As directed    Follow-up with PCP in 1 to 2 weeks Please repeat CBC, BMP and magnesium level on follow-up visit Take medications as prescribed Follow-up with cardiology as an outpatient   Increase activity slowly   Complete by: As directed    No wound care   Complete by: As directed      Allergies as of 12/27/2020      Reactions   Prolia [denosumab] Other (See Comments)   Severe pain, could not fuction      Medication List    TAKE these medications   alendronate 70 MG tablet Commonly known as: FOSAMAX Take 70 mg by mouth once a week. Notes  to patient: You did not receive this medication during your hospital stay. You  may resume it after discharge.   ALPRAZolam 0.25 MG tablet Commonly known as: XANAX Take 0.25 mg by mouth 2 (two) times daily as needed for anxiety. For anxiety   aspirin 81 MG EC tablet Take 1 tablet (81 mg total) by mouth at bedtime. Swallow whole. What changed: additional instructions   cyanocobalamin 100 MCG tablet Take 1 tablet (100 mcg total) by mouth daily. Start taking on: December 28, 2020   docusate sodium 100 MG capsule Commonly known as: COLACE Take 100 mg by mouth daily as needed.   guaiFENesin 600 MG 12 hr tablet Commonly known as: MUCINEX Take 2 tablets (1,200 mg total) by mouth 2 (two) times daily.   hydrALAZINE 10 MG tablet Commonly known as: APRESOLINE Take 1 tablet (10 mg total) by mouth every 8 (eight) hours.   isosorbide mononitrate 30 MG 24 hr tablet Commonly known as: IMDUR Take 1 tablet (30 mg total) by mouth daily. Start taking on: December 28, 2020   losartan 25 MG tablet Commonly known as: COZAAR Take 1 tablet (25 mg total) by mouth daily. Start taking on: December 28, 2020 What changed:   medication strength  how much to take   lovastatin 40 MG tablet Commonly known as: MEVACOR Take 40 mg by mouth every evening.   OCUVITE PRESERVISION PO Take by mouth 2 (two) times daily.   spironolactone 25 MG tablet Commonly known as: ALDACTONE Take 0.5 tablets (12.5 mg total) by mouth daily. Start taking on: December 28, 2020   venlafaxine XR 75 MG 24 hr capsule Commonly known as: EFFEXOR-XR Take 75 mg by mouth daily after breakfast.            Durable Medical Equipment  (From admission, onward)         Start     Ordered   12/20/20 0928  For home use only DME oxygen  Once       Question Answer Comment  Length of Need 6 Months   Mode or (Route) Nasal cannula   Liters per Minute 2   Frequency Continuous (stationary and portable oxygen unit needed)   Oxygen delivery  system Gas      12/20/20 0927          Contact information for follow-up providers    Hulan Fess, MD Follow up in 2 week(s).   Specialty: Family Medicine Contact information: [place] [address] [phone]        Donato Heinz, MD Follow up in 2 week(s).   Specialties: Cardiology, Radiology Contact information: [address] [place] [address] [phone]            Contact information for after-discharge care    [facility] .   Service: Skilled Nursing Contact information: [address] [address] [phone]                 Allergies  Allergen Reactions  . Prolia [Denosumab] Other (See Comments)    Severe pain, could not fuction    Consultations:  Cardiology   Procedures/Studies: DG Chest 2 View  Result Date: 12/15/2020 CLINICAL DATA:  87 year old female with shortness of breath. EXAM: CHEST - 2 VIEW COMPARISON:  Chest radiograph dated 03/05/2013 FINDINGS: Small bilateral pleural effusions, left greater right with associated bibasilar compressive atelectasis. Pneumonia is not excluded clinical correlation is recommended. There is diffuse interstitial and vascular prominence consistent with edema. No pneumothorax. There is cardiomegaly. Atherosclerotic  calcification of the aorta. Osteopenia with scoliosis and degenerative changes of the spine. A 16 mm ovoid radiopaque focus over the spine on the lateral view is not identified on the frontal projection. No acute osseous pathology. IMPRESSION: CHF with small bilateral pleural effusions and bibasilar atelectasis. Pneumonia is not excluded. Electronically Signed   By: Anner Crete M.D.   On: 12/15/2020 15:56   DG Abd 1 View  Result Date: 12/21/2020 CLINICAL DATA:  Nausea and vomiting EXAM: ABDOMEN - 1 VIEW COMPARISON:  None. FINDINGS: Scattered large and small bowel gas is noted. No  obstructive changes are seen. Changes of prior cholecystectomy and bilateral hip replacements are noted. Degenerative changes of lumbar spine are seen with a scoliosis concave to the right. Aortic calcifications are noted. No free air is seen. IMPRESSION: No acute abnormality noted. Electronically Signed   By: Inez Catalina M.D.   On: 12/21/2020 12:10   CARDIAC CATHETERIZATION  Result Date: 12/17/2020  Prox LAD to Mid LAD lesion is 20% stenosed.  Prox RCA lesion is 25% stenosed.  LV end diastolic pressure is mildly elevated.  Hemodynamic findings consistent with mild pulmonary hypertension.  1. Minimal nonobstructive CAD 2. Mildly elevated LVEDP 3. Mild pulmonary HTN. Mean PAP 22 mm Hg 4. Reduced cardiac output 2.6 L/min with index 1.72 Plan: medical management.   DG Chest Port 1 View  Result Date: 12/16/2020 CLINICAL DATA:  CHF EXAM: PORTABLE CHEST 1 VIEW COMPARISON:  Radiograph 12/15/2020, CT 04/30/2018 FINDINGS: Some increasing hazy interstitial opacities throughout the lungs on a background of more chronic interstitial and bronchitic change with basilar scarring. Some more dense opacity is noted in gradient fashion towards the lung bases could reflect developing alveolar edema or layering pleural effusion. No pneumothorax. Cardiomegaly is similar to priors with a calcified aorta. No acute osseous or soft tissue abnormality. Degenerative changes are present in the imaged spine and shoulders. Dextrocurvature of the thoracic levels is similar to prior studies. Telemetry leads overlie the chest. IMPRESSION: 1. Increasing hazy interstitial opacities throughout the lungs likely reflecting edema on a background of more chronic interstitial and bronchitic change with basilar scarring. 2. More dense opacity in gradient towards the lung bases could reflect developing alveolar edema or layering pleural effusion. Infection less favored in the absence of clinical symptoms. Electronically Signed   By: Lovena Le  M.D.   On: 12/16/2020 05:06   ECHOCARDIOGRAM COMPLETE  Result Date: 12/16/2020    ECHOCARDIOGRAM REPORT   Patient Name:   [name] Date of Exam: 12/16/2020 Medical Rec #:  [id number]      Height:       59.0 in Accession #:    [id number]     Weight:       132.2 lb Date of Birth:  20-May-1934      BSA:          1.547 m Patient Age:    44 years       BP:           153/102 mmHg Patient Gender: F              HR:           79 bpm. Exam Location:  Inpatient Procedure: 2D Echo, Color Doppler and Cardiac Doppler Indications:    CHF-Acute Diastolic  History:        Patient has prior history of Echocardiogram examinations. COPD,  Signs/Symptoms:Shortness of Breath; Risk Factors:Hypertension.  Sonographer:    Clayton Lefort RDCS (AE) Referring Phys: [id number] [place]  1. Left ventricular ejection fraction, by estimation, is 25 to 30%. Left ventricular ejection fraction by 3D volume is 31 %. The left ventricle has severely decreased function. The left ventricle demonstrates global hypokinesis. There is mild left ventricular hypertrophy. Left ventricular diastolic parameters are consistent with Grade I diastolic dysfunction (impaired relaxation).  2. Right ventricular systolic function is normal. The right ventricular size is normal. There is mildly elevated pulmonary artery systolic pressure.  3. Left atrial size was severely dilated.  4. The mitral valve is degenerative. Moderate mitral valve regurgitation. No evidence of mitral stenosis. Moderate mitral annular calcification.  5. The aortic valve is calcified. Aortic valve regurgitation is trivial. Mild aortic valve sclerosis is present, with no evidence of aortic valve stenosis.  6. Aortic dilatation noted. There is mild dilatation of the ascending aorta, measuring 39 mm.  7. The inferior vena cava is normal in size with greater than 50% respiratory variability, suggesting right atrial pressure of 3 mmHg. FINDINGS  Left Ventricle: Left  ventricular ejection fraction, by estimation, is 25 to 30%. Left ventricular ejection fraction by 3D volume is 31 %. The left ventricle has severely decreased function. The left ventricle demonstrates global hypokinesis. The left ventricular internal cavity size was normal in size. There is mild left ventricular hypertrophy. Left ventricular diastolic parameters are consistent with Grade I diastolic dysfunction (impaired relaxation). Right Ventricle: The right ventricular size is normal. No increase in right ventricular wall thickness. Right ventricular systolic function is normal. There is mildly elevated pulmonary artery systolic pressure. The tricuspid regurgitant velocity is 2.66  m/s, and with an assumed right atrial pressure of 8 mmHg, the estimated right ventricular systolic pressure is 87.5 mmHg. Left Atrium: Left atrial size was severely dilated. Right Atrium: Right atrial size was normal in size. Pericardium: There is no evidence of pericardial effusion. Mitral Valve: The mitral valve is degenerative in appearance. There is moderate thickening of the mitral valve leaflet(s). There is moderate calcification of the mitral valve leaflet(s). Moderate mitral annular calcification. Moderate mitral valve regurgitation. No evidence of mitral valve stenosis. MV peak gradient, 4.2 mmHg. The mean mitral valve gradient is 1.0 mmHg. Tricuspid Valve: The tricuspid valve is normal in structure. Tricuspid valve regurgitation is trivial. No evidence of tricuspid stenosis. Aortic Valve: The aortic valve is calcified. Aortic valve regurgitation is trivial. Aortic regurgitation PHT measures 465 msec. Mild aortic valve sclerosis is present, with no evidence of aortic valve stenosis. Aortic valve mean gradient measures 4.0 mmHg. Aortic valve peak gradient measures 7.7 mmHg. Aortic valve area, by VTI measures 1.93 cm. Pulmonic Valve: The pulmonic valve was normal in structure. Pulmonic valve regurgitation is not visualized. No  evidence of pulmonic stenosis. Aorta: The aortic root is normal in size and structure and aortic dilatation noted. There is mild dilatation of the ascending aorta, measuring 39 mm. Venous: The inferior vena cava is normal in size with greater than 50% respiratory variability, suggesting right atrial pressure of 3 mmHg. IAS/Shunts: No atrial level shunt detected by color flow Doppler.  LEFT VENTRICLE PLAX 2D LVIDd:         5.20 cm         Diastology LVIDs:         4.80 cm         LV e' medial:    4.24 cm/s LV PW:  1.60 cm         LV E/e' medial:  17.7 LV IVS:        1.30 cm         LV e' lateral:   4.79 cm/s LVOT diam:     2.20 cm         LV E/e' lateral: 15.7 LV SV:         44 LV SV Index:   28 LVOT Area:     3.80 cm        3D Volume EF                                LV 3D EF:    Left                                             ventricular                                             ejection                                             fraction by                                             3D volume                                             is 31 %.                                 3D Volume EF:                                3D EF:        31 %                                LV EDV:       183 ml                                LV ESV:       126 ml                                LV SV:        57 ml RIGHT VENTRICLE            IVC RV Basal diam:  3.30 cm    IVC diam: 1.30 cm RV S prime:     9.57 cm/s  TAPSE (M-mode): 2.3 cm LEFT ATRIUM             Index       RIGHT ATRIUM           Index LA diam:        3.10 cm 2.00 cm/m  RA Area:     14.30 cm LA Vol (A2C):   90.0 ml 58.18 ml/m RA Volume:   30.70 ml  19.85 ml/m LA Vol (A4C):   55.4 ml 35.82 ml/m LA Biplane Vol: 71.1 ml 45.97 ml/m  AORTIC VALVE AV Area (Vmax):    1.94 cm AV Area (Vmean):   2.03 cm AV Area (VTI):     1.93 cm AV Vmax:           139.00 cm/s AV Vmean:          93.200 cm/s AV VTI:            0.226 m AV Peak Grad:      7.7 mmHg AV Mean Grad:       4.0 mmHg LVOT Vmax:         71.00 cm/s LVOT Vmean:        49.800 cm/s LVOT VTI:          0.115 m LVOT/AV VTI ratio: 0.51 AI PHT:            465 msec  AORTA Ao Root diam: 3.10 cm Ao Asc diam:  3.90 cm MITRAL VALVE                 TRICUSPID VALVE MV Area (PHT): 3.91 cm      TR Peak grad:   28.3 mmHg MV Area VTI:   1.62 cm      TR Vmax:        266.00 cm/s MV Peak grad:  4.2 mmHg MV Mean grad:  1.0 mmHg      SHUNTS MV Vmax:       1.02 m/s      Systemic VTI:  0.12 m MV Vmean:      49.3 cm/s     Systemic Diam: 2.20 cm MV Decel Time: 194 msec MR Peak grad:    119.7 mmHg MR Mean grad:    65.0 mmHg MR Vmax:         547.00 cm/s MR Vmean:        369.0 cm/s MR PISA:         3.08 cm MR PISA Eff ROA: 22 mm MR PISA Radius:  0.70 cm MV E velocity: 75.00 cm/s MV A velocity: 81.80 cm/s MV E/A ratio:  0.92 Candee Furbish MD Electronically signed by Candee Furbish MD Signature Date/Time: 12/16/2020/2:19:56 PM    Final    VAS [place] LOWER EXTREMITY VENOUS (DVT) (ONLY [facility] & [facility])  Result Date: 12/16/2020  Lower Venous DVT Study Indications: Swelling.  Risk Factors: None identified. Comparison Study: No prior studies. Performing Technologist: Oliver Hum RVT  Examination Guidelines: A complete evaluation includes B-mode imaging, spectral Doppler, color Doppler, and power Doppler as needed of all accessible portions of each vessel. Bilateral testing is considered an integral part of a complete examination. Limited examinations for reoccurring indications may be performed as noted. The reflux portion of the exam is performed with the patient in reverse Trendelenburg.  +-----+---------------+---------+-----------+----------+--------------+ RIGHTCompressibilityPhasicitySpontaneityPropertiesThrombus Aging +-----+---------------+---------+-----------+----------+--------------+ CFV  Full           Yes      Yes                                 +-----+---------------+---------+-----------+----------+--------------+    +---------+---------------+---------+-----------+----------+--------------+  LEFT     CompressibilityPhasicitySpontaneityPropertiesThrombus Aging +---------+---------------+---------+-----------+----------+--------------+ CFV      Full           Yes      Yes                                 +---------+---------------+---------+-----------+----------+--------------+ SFJ      Full                                                        +---------+---------------+---------+-----------+----------+--------------+ FV Prox  Full                                                        +---------+---------------+---------+-----------+----------+--------------+ FV Mid   Full                                                        +---------+---------------+---------+-----------+----------+--------------+ FV DistalFull                                                        +---------+---------------+---------+-----------+----------+--------------+ PFV      Full                                                        +---------+---------------+---------+-----------+----------+--------------+ POP      Full           Yes      Yes                                 +---------+---------------+---------+-----------+----------+--------------+ PTV      Full                                                        +---------+---------------+---------+-----------+----------+--------------+ PERO     Full                                                        +---------+---------------+---------+-----------+----------+--------------+     Summary: RIGHT: - No evidence of common femoral vein obstruction.  LEFT: - There is no evidence of deep vein thrombosis in the lower extremity.  - No cystic structure found in the popliteal fossa.  *See table(s) above for measurements and observations. Electronically  signed by Jamelle Haring on 12/16/2020 at 8:10:31 PM.    Final        Subjective: Patient seen and examined.  Sitting comfortably on the bed.  Denies any new complaints.  Reports improvement in breathing.  Denies chest pain, wheezing, leg swelling, orthopnea, PND, fever, chills, nausea or vomiting.  Discharge Exam: Vitals:   12/27/20 0748 12/27/20 1100  BP: 114/71 125/64  Pulse: 69 67  Resp: (!) 24 20  Temp: 98 F (36.7 C) 97.8 F (36.6 C)  SpO2: 92% 94%   Vitals:   12/26/20 2341 12/27/20 0342 12/27/20 0748 12/27/20 1100  BP: 108/69 (!) 148/64 114/71 125/64  Pulse: 68 65 69 67  Resp: 18 20 (!) 24 20  Temp: 98.3 F (36.8 C) 98.3 F (36.8 C) 98 F (36.7 C) 97.8 F (36.6 C)  TempSrc: Oral Oral Oral Oral  SpO2: 99% 98% 92% 94%  Weight:  48.4 kg    Height:        General: Pt is alert, awake, not in acute distress, on room air, communicating well Cardiovascular: RRR, S1/S2 +, no rubs, no gallops Respiratory: CTA bilaterally, no wheezing, no rhonchi Abdominal: Soft, NT, ND, bowel sounds + Extremities: no edema, no cyanosis    The results of significant diagnostics from this hospitalization (including imaging, microbiology, ancillary and laboratory) are listed below for reference.     Microbiology: Recent Results (from the past 240 hour(s))  SARS CORONAVIRUS 2 (TAT 6-24 HRS) Nasopharyngeal Nasopharyngeal Swab     Status: None   Collection Time: 12/26/20  1:22 PM   Specimen: Nasopharyngeal Swab  Result Value Ref Range Status   SARS Coronavirus 2 NEGATIVE NEGATIVE Final    Comment: (NOTE) SARS-CoV-2 target nucleic acids are NOT DETECTED.  The SARS-CoV-2 RNA is generally detectable in upper and lower respiratory specimens during the acute phase of infection. Negative results do not preclude SARS-CoV-2 infection, do not rule out co-infections with other pathogens, and should not be used as the sole basis for treatment or other patient management decisions. Negative results must be combined with clinical observations, patient  history, and epidemiological information. The expected result is Negative.  Fact Sheet for Patients: SugarRoll.be  Fact Sheet for Healthcare Providers: https://www.woods-mathews.com/  This test is not yet approved or cleared by the [place] FDA and  has been authorized for detection and/or diagnosis of SARS-CoV-2 by FDA under an Emergency Use Authorization (EUA). This EUA will remain  in effect (meaning this test can be used) for the duration of the COVID-19 declaration under Se ction 564(b)(1) of the Act, 21 U.S.C. section 360bbb-3(b)(1), unless the authorization is terminated or revoked sooner.  Performed at [facility], [place] [address], [place], [place] [address]      Labs: BNP (last 3 results) Recent Labs    12/15/20 1514  BNP 1,025.8*   Basic Metabolic Panel: Recent Labs  Lab 12/23/20 0214 12/24/20 0600 12/25/20 0235 12/26/20 0311 12/27/20 0145  NA 132* 131* 132* 136 133*  K 3.4* 4.3 3.8 3.7 4.6  CL 93* 99 101 100 98  CO2 26 23 22 26 23   GLUCOSE 115* 81 86 123* 96  BUN 48* 53* 39* 29* 27*  CREATININE 1.79* 1.72* 1.10* 0.91 0.82  CALCIUM 10.2 9.3 9.4 9.7 10.4*  MG  --   --   --   --  1.8   Liver Function Tests: No results for input(s): AST, ALT, ALKPHOS, BILITOT, PROT, ALBUMIN in the last 168 hours. No  results for input(s): LIPASE, AMYLASE in the last 168 hours. No results for input(s): AMMONIA in the last 168 hours. CBC: Recent Labs  Lab 12/22/20 0835 12/23/20 0214 12/24/20 0600 12/25/20 0235 12/26/20 0311  WBC 19.0* 13.1* 10.5 8.4 7.1  NEUTROABS  --  11.8*  --   --   --   HGB 15.3* 14.5 12.9 13.0 13.5  HCT 46.2* 42.2 38.1 38.8 40.3  MCV 93.0 91.5 92.3 92.6 93.3  PLT 166 153 148* 170 192   Cardiac Enzymes: No results for input(s): CKTOTAL, CKMB, CKMBINDEX, TROPONINI in the last 168 hours. BNP: Invalid input(s): POCBNP CBG: No results for input(s): GLUCAP in the last 168 hours. D-Dimer No  results for input(s): DDIMER in the last 72 hours. Hgb A1c No results for input(s): HGBA1C in the last 72 hours. Lipid Profile No results for input(s): CHOL, HDL, LDLCALC, TRIG, CHOLHDL, LDLDIRECT in the last 72 hours. Thyroid function studies No results for input(s): TSH, T4TOTAL, T3FREE, THYROIDAB in the last 72 hours.  Invalid input(s): FREET3 Anemia work up No results for input(s): VITAMINB12, FOLATE, FERRITIN, TIBC, IRON, RETICCTPCT in the last 72 hours. Urinalysis    Component Value Date/Time   COLORURINE YELLOW 12/16/2020 0445   APPEARANCEUR CLEAR 12/16/2020 0445   LABSPEC 1.008 12/16/2020 0445   PHURINE 7.0 12/16/2020 0445   GLUCOSEU NEGATIVE 12/16/2020 0445   HGBUR NEGATIVE 12/16/2020 0445   BILIRUBINUR NEGATIVE 12/16/2020 0445   KETONESUR NEGATIVE 12/16/2020 0445   PROTEINUR NEGATIVE 12/16/2020 0445   UROBILINOGEN 0.2 12/06/2011 1000   NITRITE NEGATIVE 12/16/2020 0445   LEUKOCYTESUR NEGATIVE 12/16/2020 0445   Sepsis Labs Invalid input(s): PROCALCITONIN,  WBC,  LACTICIDVEN Microbiology Recent Results (from the past 240 hour(s))  SARS CORONAVIRUS 2 (TAT 6-24 HRS) Nasopharyngeal Nasopharyngeal Swab     Status: None   Collection Time: 12/26/20  1:22 PM   Specimen: Nasopharyngeal Swab  Result Value Ref Range Status   SARS Coronavirus 2 NEGATIVE NEGATIVE Final    Comment: (NOTE) SARS-CoV-2 target nucleic acids are NOT DETECTED.  The SARS-CoV-2 RNA is generally detectable in upper and lower respiratory specimens during the acute phase of infection. Negative results do not preclude SARS-CoV-2 infection, do not rule out co-infections with other pathogens, and should not be used as the sole basis for treatment or other patient management decisions. Negative results must be combined with clinical observations, patient history, and epidemiological information. The expected result is Negative.  Fact Sheet for Patients: SugarRoll.be  Fact  Sheet for Healthcare Providers: https://www.woods-mathews.com/  This test is not yet approved or cleared by the [place] FDA and  has been authorized for detection and/or diagnosis of SARS-CoV-2 by FDA under an Emergency Use Authorization (EUA). This EUA will remain  in effect (meaning this test can be used) for the duration of the COVID-19 declaration under Se ction 564(b)(1) of the Act, 21 U.S.C. section 360bbb-3(b)(1), unless the authorization is terminated or revoked sooner.  Performed at [facility], [place] [address], [place], [place] [address]      Time coordinating discharge: Over 30 minutes  SIGNED:   Mckinley Jewel, MD  [facility] 12/27/2020, 3:00 PM Pager   If 7PM-7AM, please contact night-coverage [email or web address]

## 2021-01-07 DIAGNOSIS — I2510 Atherosclerotic heart disease of native coronary artery without angina pectoris: Secondary | ICD-10-CM | POA: Diagnosis not present

## 2021-01-07 DIAGNOSIS — I10 Essential (primary) hypertension: Secondary | ICD-10-CM | POA: Diagnosis not present

## 2021-01-07 DIAGNOSIS — M6281 Muscle weakness (generalized): Secondary | ICD-10-CM | POA: Diagnosis not present

## 2021-01-07 DIAGNOSIS — I5042 Chronic combined systolic (congestive) and diastolic (congestive) heart failure: Secondary | ICD-10-CM | POA: Diagnosis not present

## 2021-01-07 DIAGNOSIS — I429 Cardiomyopathy, unspecified: Secondary | ICD-10-CM | POA: Diagnosis not present

## 2021-01-07 DIAGNOSIS — J449 Chronic obstructive pulmonary disease, unspecified: Secondary | ICD-10-CM | POA: Diagnosis not present

## 2021-01-10 DIAGNOSIS — J449 Chronic obstructive pulmonary disease, unspecified: Secondary | ICD-10-CM | POA: Diagnosis not present

## 2021-01-10 DIAGNOSIS — E559 Vitamin D deficiency, unspecified: Secondary | ICD-10-CM | POA: Diagnosis not present

## 2021-01-10 DIAGNOSIS — I428 Other cardiomyopathies: Secondary | ICD-10-CM | POA: Diagnosis not present

## 2021-01-10 DIAGNOSIS — I110 Hypertensive heart disease with heart failure: Secondary | ICD-10-CM | POA: Diagnosis not present

## 2021-01-10 DIAGNOSIS — H538 Other visual disturbances: Secondary | ICD-10-CM | POA: Diagnosis not present

## 2021-01-10 DIAGNOSIS — I77819 Aortic ectasia, unspecified site: Secondary | ICD-10-CM | POA: Diagnosis not present

## 2021-01-10 DIAGNOSIS — I5041 Acute combined systolic (congestive) and diastolic (congestive) heart failure: Secondary | ICD-10-CM | POA: Diagnosis not present

## 2021-01-10 DIAGNOSIS — E785 Hyperlipidemia, unspecified: Secondary | ICD-10-CM | POA: Diagnosis not present

## 2021-01-10 DIAGNOSIS — R7303 Prediabetes: Secondary | ICD-10-CM | POA: Diagnosis not present

## 2021-01-10 DIAGNOSIS — I2510 Atherosclerotic heart disease of native coronary artery without angina pectoris: Secondary | ICD-10-CM | POA: Diagnosis not present

## 2021-01-10 DIAGNOSIS — F32A Depression, unspecified: Secondary | ICD-10-CM | POA: Diagnosis not present

## 2021-01-10 DIAGNOSIS — Z7982 Long term (current) use of aspirin: Secondary | ICD-10-CM | POA: Diagnosis not present

## 2021-01-11 DIAGNOSIS — I10 Essential (primary) hypertension: Secondary | ICD-10-CM | POA: Diagnosis not present

## 2021-01-11 DIAGNOSIS — J449 Chronic obstructive pulmonary disease, unspecified: Secondary | ICD-10-CM | POA: Diagnosis not present

## 2021-01-11 DIAGNOSIS — D696 Thrombocytopenia, unspecified: Secondary | ICD-10-CM | POA: Diagnosis not present

## 2021-01-11 DIAGNOSIS — I5042 Chronic combined systolic (congestive) and diastolic (congestive) heart failure: Secondary | ICD-10-CM | POA: Diagnosis not present

## 2021-01-11 DIAGNOSIS — F17200 Nicotine dependence, unspecified, uncomplicated: Secondary | ICD-10-CM | POA: Diagnosis not present

## 2021-01-13 DIAGNOSIS — F32A Depression, unspecified: Secondary | ICD-10-CM | POA: Diagnosis not present

## 2021-01-13 DIAGNOSIS — R7303 Prediabetes: Secondary | ICD-10-CM | POA: Diagnosis not present

## 2021-01-13 DIAGNOSIS — Z7982 Long term (current) use of aspirin: Secondary | ICD-10-CM | POA: Diagnosis not present

## 2021-01-13 DIAGNOSIS — I5041 Acute combined systolic (congestive) and diastolic (congestive) heart failure: Secondary | ICD-10-CM | POA: Diagnosis not present

## 2021-01-13 DIAGNOSIS — H538 Other visual disturbances: Secondary | ICD-10-CM | POA: Diagnosis not present

## 2021-01-13 DIAGNOSIS — I77819 Aortic ectasia, unspecified site: Secondary | ICD-10-CM | POA: Diagnosis not present

## 2021-01-13 DIAGNOSIS — E785 Hyperlipidemia, unspecified: Secondary | ICD-10-CM | POA: Diagnosis not present

## 2021-01-13 DIAGNOSIS — I428 Other cardiomyopathies: Secondary | ICD-10-CM | POA: Diagnosis not present

## 2021-01-13 DIAGNOSIS — I110 Hypertensive heart disease with heart failure: Secondary | ICD-10-CM | POA: Diagnosis not present

## 2021-01-13 DIAGNOSIS — J449 Chronic obstructive pulmonary disease, unspecified: Secondary | ICD-10-CM | POA: Diagnosis not present

## 2021-01-13 DIAGNOSIS — E559 Vitamin D deficiency, unspecified: Secondary | ICD-10-CM | POA: Diagnosis not present

## 2021-01-13 DIAGNOSIS — I2510 Atherosclerotic heart disease of native coronary artery without angina pectoris: Secondary | ICD-10-CM | POA: Diagnosis not present

## 2021-01-14 ENCOUNTER — Ambulatory Visit (INDEPENDENT_AMBULATORY_CARE_PROVIDER_SITE_OTHER): Payer: Medicare Other

## 2021-01-14 ENCOUNTER — Other Ambulatory Visit: Payer: Self-pay

## 2021-01-14 ENCOUNTER — Encounter: Payer: Self-pay | Admitting: Cardiology

## 2021-01-14 ENCOUNTER — Ambulatory Visit: Payer: Medicare Other | Admitting: Cardiology

## 2021-01-14 VITALS — BP 126/73 | HR 72 | Ht 60.0 in | Wt 112.0 lb

## 2021-01-14 DIAGNOSIS — I5042 Chronic combined systolic (congestive) and diastolic (congestive) heart failure: Secondary | ICD-10-CM

## 2021-01-14 DIAGNOSIS — I429 Cardiomyopathy, unspecified: Secondary | ICD-10-CM

## 2021-01-14 DIAGNOSIS — R001 Bradycardia, unspecified: Secondary | ICD-10-CM | POA: Diagnosis not present

## 2021-01-14 DIAGNOSIS — R413 Other amnesia: Secondary | ICD-10-CM

## 2021-01-14 DIAGNOSIS — I10 Essential (primary) hypertension: Secondary | ICD-10-CM

## 2021-01-14 MED ORDER — METOPROLOL SUCCINATE ER 25 MG PO TB24: 25.0000 mg | ORAL_TABLET | Freq: Every day | ORAL | 3 refills | Status: DC

## 2021-01-14 NOTE — Progress Notes (Signed)
Cardiology Office Note:    Date:  01/16/2021   ID:  [name], DOB October 06, 1934, MRN [id number]  PCP:  Hulan Fess, MD  Cardiologist:  Donato Heinz, MD  Electrophysiologist:  None   Referring MD: Hulan Fess, MD   Chief Complaint  Patient presents with  . Congestive Heart Failure    History of Present Illness:    [name] is a 87 y.o. female with a hx of chronic combined systolic and diastolic heart failure hypertension, COPD who presents for hospital follow-up.  She was admitted to [facility] from 2/23 through 12/27/2020.  Had presented with shortness of breath with significantly elevated BP, and found to be significantly volume overloaded.  Echocardiogram on 12/16/20 showed EF 25 to 32%, grade 1 diastolic dysfunction, normal RV function, severe left atrial dilatation, moderate MR. RHC/LHC on 12/17/2020 showed minimal nonobstructive CAD, RA 4, RV 22/3, PA 32/11/22, PW 16, LVEDP 24.  Discharged on hydralazine 10 mg 3 times daily, Imdur 30 mg daily, losartan 25 mg daily, Aldactone 12.5 mg daily.  She developed AKI due to IV diuresis, was not discharged on diuresis.  Since discharge from the hospital, she reports that she is feeling improved.  She denies any chest pain or dyspnea.  Denies any syncope, lightheadedness, lower extremity edema, or palpitations.  Reports weight has been stable.  Daughters report she has been having cognitive issues/memory loss.   Wt Readings from Last 3 Encounters:  01/14/21 112 lb (50.8 kg)  12/27/20 106 lb 11.2 oz (48.4 kg)  04/16/18 132 lb 3.2 oz (60 kg)     Past Medical History:  Diagnosis Date  . Acute combined systolic and diastolic CHF, NYHA class 4 ([place])   . Anxiety   . Arthritis    knees, hands   . Cancer (HCC)    SQUAMOuS CELL CARCINOMA OF SKIN- PERIANAL   . COPD (chronic obstructive pulmonary disease) ([place])    smoker  . Depression   . Elbow fracture, right 2013  . Hyperlipidemia   . Hypertension   . Osteoporosis   .  Pneumonia    hx of   . S/P radiation therapy 04/08/2014-05/14/2014   Right perianal skin / 45 Gy in 25 fractions  . Wears glasses     Past Surgical History:  Procedure Laterality Date  . ABDOMINAL HYSTERECTOMY     TOTAL VAGINAL HYSTERECTOMY  . APPENDECTOMY    . CHOLECYSTECTOMY    . ELBOW FRACTURE SURGERY  2013   rt  . EVALUATION UNDER ANESTHESIA WITH HEMORRHOIDECTOMY N/A 05/29/2016   Procedure: EXAM UNDER ANESTHESIA WITH INTERNAL AND EXTERNAL HEMORRHOIDECTOMY;  Surgeon: Georganna Skeans, MD;  Location: [facility];  Service: General;  Laterality: N/A;  . EYE SURGERY     left eye cataract surgery   . HEMORRHOID SURGERY N/A 02/06/2014   Procedure: EXCISION PERIANAL MASS;  Surgeon: Zenovia Jarred, MD;  Location: [facility];  Service: General;  Laterality: N/A;  . HERNIA REPAIR     left inguinal hernia repair   . I & D EXTREMITY Left 03/05/2013   Procedure: Irrigation and Debridement Olecranon Bursa/Left Elbow;  Surgeon: Linna Hoff, MD;  Location: [facility];  Service: Orthopedics;  Laterality: Left;  . JOINT REPLACEMENT     right and left hip replacement   . MASS EXCISION N/A 05/24/2015   Procedure: EXCISION ANAL MASS X 2;  Surgeon: Georganna Skeans, MD;  Location: [facility];  Service: General;  Laterality: N/A;  .  OTHER SURGICAL HISTORY     arthroscopic right knee   . OTHER SURGICAL HISTORY     right carpal tunnel  . OTHER SURGICAL HISTORY     right elbow surgery   . OTHER SURGICAL HISTORY     oophorectomy 1958  . RECTAL BIOPSY N/A 05/29/2016   Procedure: ANAL BIOPSY;  Surgeon: Georganna Skeans, MD;  Location: [facility];  Service: General;  Laterality: N/A;  . RIGHT/LEFT HEART CATH AND CORONARY ANGIOGRAPHY N/A 12/17/2020   Procedure: RIGHT/LEFT HEART CATH AND CORONARY ANGIOGRAPHY;  Surgeon: Martinique, Peter M, MD;  Location: [facility];  Service: Cardiovascular;  Laterality: N/A;  . TOTAL KNEE ARTHROPLASTY  12/13/2011    Procedure: TOTAL KNEE ARTHROPLASTY;  Surgeon: Tobi Bastos, MD;  Location: [facility];  Service: Orthopedics;  Laterality: Right;    Current Medications: Current Meds  Medication Sig  . alendronate (FOSAMAX) 70 MG tablet Take 70 mg by mouth once a week.  . ALPRAZolam (XANAX) 0.25 MG tablet Take 0.25 mg by mouth 2 (two) times daily as needed for anxiety. For anxiety  . aspirin EC 81 MG EC tablet Take 1 tablet (81 mg total) by mouth at bedtime. Swallow whole.  . docusate sodium (COLACE) 100 MG capsule Take 100 mg by mouth daily as needed.  Marland Kitchen guaiFENesin (MUCINEX) 600 MG 12 hr tablet Take 2 tablets (1,200 mg total) by mouth 2 (two) times daily.  . hydrALAZINE (APRESOLINE) 10 MG tablet Take 1 tablet (10 mg total) by mouth every 8 (eight) hours.  . isosorbide mononitrate (IMDUR) 30 MG 24 hr tablet Take 1 tablet (30 mg total) by mouth daily.  Marland Kitchen losartan (COZAAR) 25 MG tablet Take 1 tablet (25 mg total) by mouth daily.  Marland Kitchen lovastatin (MEVACOR) 20 MG tablet Take 20 mg by mouth at bedtime.  . metoprolol succinate (TOPROL XL) 25 MG 24 hr tablet Take 1 tablet (25 mg total) by mouth daily.  . Multiple Vitamins-Minerals (OCUVITE PRESERVISION PO) Take by mouth 2 (two) times daily.  Marland Kitchen spironolactone (ALDACTONE) 25 MG tablet Take 25 mg by mouth daily.  Marland Kitchen venlafaxine (EFFEXOR-XR) 75 MG 24 hr capsule Take 75 mg by mouth daily after breakfast.  . vitamin B-12 100 MCG tablet Take 1 tablet (100 mcg total) by mouth daily.     Allergies:   Prolia [denosumab]   Social History   Socioeconomic History  . Marital status: Widowed    Spouse name: Not on file  . Number of children: Not on file  . Years of education: Not on file  . Highest education level: Not on file  Occupational History  . Not on file  Tobacco Use  . Smoking status: Former Smoker    Packs/day: 0.50    Years: 50.00    Pack years: 25.00    Types: Cigarettes    Quit date: 12/15/2020    Years since quitting: 0.0  . Smokeless tobacco: Never  Used  Vaping Use  . Vaping Use: Never used  Substance and Sexual Activity  . Alcohol use: No  . Drug use: No  . Sexual activity: Not Currently  Other Topics Concern  . Not on file  Social History Narrative  . Not on file   Social Determinants of Health   Financial Resource Strain: Low Risk   . Difficulty of Paying Living Expenses: Not very hard  Food Insecurity: No Food Insecurity  . Worried About Charity fundraiser in the Last Year: Never true  .  Ran Out of Food in the Last Year: Never true  Transportation Needs: No Transportation Needs  . Lack of Transportation (Medical): No  . Lack of Transportation (Non-Medical): No  Physical Activity: Inactive  . Days of Exercise per Week: 0 days  . Minutes of Exercise per Session: 0 min  Stress: Not on file  Social Connections: Not on file     Family History: The patient's family history includes Arthritis in her father; Cancer in her father; Cancer (age of onset: 32) in her child; Cancer (age of onset: 72) in her sister; Hypertension in her mother.  ROS:   Please see the history of present illness.    All other systems reviewed and are negative.  EKGs/Labs/Other Studies Reviewed:    The following studies were reviewed today:   EKG:  EKG is not ordered today.    Recent Labs: 12/15/2020: B Natriuretic Peptide 3,235.0; TSH 3.422 12/26/2020: Hemoglobin 13.5; Platelets 192 12/27/2020: BUN 27; Creatinine, Ser 0.82; Magnesium 1.8; Potassium 4.6; Sodium 133  Recent Lipid Panel No results found for: CHOL, TRIG, HDL, CHOLHDL, VLDL, LDLCALC, LDLDIRECT  Physical Exam:    VS:  BP 126/73   Pulse 72   Ht 5' (1.524 m)   Wt 112 lb (50.8 kg)   SpO2 99%   BMI 21.87 kg/m     Wt Readings from Last 3 Encounters:  01/14/21 112 lb (50.8 kg)  12/27/20 106 lb 11.2 oz (48.4 kg)  04/16/18 132 lb 3.2 oz (60 kg)     GEN: Well nourished, well developed in no acute distress HEENT: Normal NECK: No JVD; No carotid bruits LYMPHATICS: No  lymphadenopathy CARDIAC: RRR, no murmurs, rubs, gallops RESPIRATORY:  Clear to auscultation without rales, wheezing or rhonchi  ABDOMEN: Soft, non-tender, non-distended MUSCULOSKELETAL:  No edema; No deformity  SKIN: Warm and dry NEUROLOGIC:  Alert and oriented x 3 PSYCHIATRIC:  Normal affect   ASSESSMENT:    1. Chronic combined systolic (congestive) and diastolic (congestive) heart failure ([place])   2. Essential hypertension   3. Bradycardia   4. Memory loss    PLAN:    Chronic combined systolic and diastolic heart failure: Echocardiogram on 12/16/20 showed EF 25 to 54%, grade 1 diastolic dysfunction, normal RV function, severe left atrial dilatation, moderate MR. RHC/LHC on 12/17/2020 showed minimal nonobstructive CAD, RA 4, RV 22/3, PA 32/11/22, PW 16, LVEDP 24.  Appears euvolemic. -Cardiac MRI to work-up the etiology of nonischemic cardiomyopathy -Continue losartan 25 mg daily -Continue Imdur 30 mg daily/hydralazine 10 mg 3 times daily -Continue spironolactone 25 mg daily -Start Toprol-XL 25 mg daily.  This was not started during her hospitalization due to reported intermittent bradycardia.  Will check Zio patch x3 days -She had labs drawn 2 days ago at her PCP, will obtain records -Schedule follow-up in pharmacy heart failure clinic in 2 weeks to continue to titrate medications  Hypertension: Continue losartan, Imdur/hydralazine, spironolactone, add Toprol-XL as above.  Appears controlled  Memory loss: Family requests referral to neurology, will refer  RTC in 2 months  Medication Adjustments/Labs and Tests Ordered: Current medicines are reviewed at length with the patient today.  Concerns regarding medicines are outlined above.  Orders Placed This Encounter  Procedures  . MR CARDIAC MORPHOLOGY W WO CONTRAST  . Ambulatory referral to Neurology  . LONG TERM MONITOR (3-14 DAYS)   Meds ordered this encounter  Medications  . metoprolol succinate (TOPROL XL) 25 MG 24 hr tablet     Sig: Take 1 tablet (25  mg total) by mouth daily.    Dispense:  90 tablet    Refill:  3    Patient Instructions  Medication Instructions:  START metoprolol succinate (Toprol XL) 25 mg once a day  *If you need a refill on your cardiac medications before your next appointment, please call your pharmacy*  Testing/Procedures: Your physician has requested that you have a cardiac MRI. Cardiac MRI uses a computer to create images of your heart as its beating, producing both still and moving pictures of your heart and major blood vessels. For further information please visit HugeFiesta.[email or web address]. Please follow the instruction sheet given to you today for more information.   ZIO XT- Long Term Monitor Instructions   Your physician has requested you wear your ZIO patch monitor___3__days.   This is a single patch monitor.  Irhythm supplies one patch monitor per enrollment.  Additional stickers are not available.   Please do not apply patch if you will be having a Nuclear Stress Test, Echocardiogram, Cardiac CT, MRI, or Chest Xray during the time frame you would be wearing the monitor. The patch cannot be worn during these tests.  You cannot remove and re-apply the ZIO XT patch monitor.   Your ZIO patch monitor will be sent USPS Priority mail from [facility] directly to your home address. The monitor may also be mailed to a PO BOX if home delivery is not available.   It may take 3-5 days to receive your monitor after you have been enrolled.   Once you have received you monitor, please review enclosed instructions.  Your monitor has already been registered assigning a specific monitor serial # to you.   Applying the monitor   Shave hair from upper left chest.   Hold abrader disc by orange tab.  Rub abrader in 40 strokes over left upper chest as indicated in your monitor instructions.   Clean area with 4 enclosed alcohol pads .  Use all pads to assure are is cleaned thoroughly.  Let  dry.   Apply patch as indicated in monitor instructions.  Patch will be place under collarbone on left side of chest with arrow pointing upward.   Rub patch adhesive wings for 2 minutes.Remove white label marked "1".  Remove white label marked "2".  Rub patch adhesive wings for 2 additional minutes.   While looking in a mirror, press and release button in center of patch.  A small green light will flash 3-4 times .  This will be your only indicator the monitor has been turned on.     Do not shower for the first 24 hours.  You may shower after the first 24 hours.   Press button if you feel a symptom. You will hear a small click.  Record Date, Time and Symptom in the Patient Log Book.   When you are ready to remove patch, follow instructions on last 2 pages of Patient Log Book.  Stick patch monitor onto last page of Patient Log Book.   Place Patient Log Book in [place] box.  Use locking tab on box and tape box closed securely.  The [employer] has [employer] on it.  Please place in mailbox as soon as possible.  Your physician should have your test results approximately 7 days after the monitor has been mailed back to [facility].   Call [place] at [phone] if you have questions regarding your ZIO XT patch monitor.  Call them immediately if you see  an orange light blinking on your monitor.   If your monitor falls off in less than 4 days contact our Monitor department at [phone].  If your monitor becomes loose or falls off after 4 days call [employer] at [phone] for suggestions on securing your monitor.   Follow-Up: At [facility], you and your health needs are our priority.  As part of our continuing mission to provide you with exceptional heart care, we have created designated Provider Care Teams.  These Care Teams include your primary Cardiologist (physician) and Advanced Practice Providers (APPs -  Physician Assistants and Nurse Practitioners)  who all work together to provide you with the care you need, when you need it.  We recommend signing up for the patient portal called "MyChart".  Sign up information is provided on this After Visit Summary.  MyChart is used to connect with patients for Virtual Visits (Telemedicine).  Patients are able to view lab/test results, encounter notes, upcoming appointments, etc.  Non-urgent messages can be sent to your provider as well.   To learn more about what you can do with MyChart, go to [email or web address].    Your next appointment:   2 weeks with pharmacist (HF med titration) 2 months with Dr. Gardiner Rhyme   Other Instructions You have been referred to: Neurology      Signed, Donato Heinz, MD  01/16/2021 3:15 PM    [place]

## 2021-01-14 NOTE — Patient Instructions (Signed)
Medication Instructions:  START metoprolol succinate (Toprol XL) 25 mg once a day  *If you need a refill on your cardiac medications before your next appointment, please call your pharmacy*  Testing/Procedures: Your physician has requested that you have a cardiac MRI. Cardiac MRI uses a computer to create images of your heart as its beating, producing both still and moving pictures of your heart and major blood vessels. For further information please visit HugeFiesta.tn. Please follow the instruction sheet given to you today for more information.   ZIO XT- Long Term Monitor Instructions   Your physician has requested you wear your ZIO patch monitor___3__days.   This is a single patch monitor.  Ir[employer] supplies one patch monitor per enrollment.  Additional stickers are not available.   Please do not apply patch if you will be having a Nuclear Stress Test, Echocardiogram, Cardiac CT, MRI, or Chest Xray during the time frame you would be wearing the monitor. The patch cannot be worn during these tests.  You cannot remove and re-apply the ZIO XT patch monitor.   Your ZIO patch monitor will be sent USPS Priority mail from [facility] directly to your home address. The monitor may also be mailed to a PO BOX if home delivery is not available.   It may take 3-5 days to receive your monitor after you have been enrolled.   Once you have received you monitor, please review enclosed instructions.  Your monitor has already been registered assigning a specific monitor serial # to you.   Applying the monitor   Shave hair from upper left chest.   Hold abrader disc by orange tab.  Rub abrader in 40 strokes over left upper chest as indicated in your monitor instructions.   Clean area with 4 enclosed alcohol pads .  Use all pads to assure are is cleaned thoroughly.  Let dry.   Apply patch as indicated in monitor instructions.  Patch will be place under collarbone on left side of chest with  arrow pointing upward.   Rub patch adhesive wings for 2 minutes.Remove white label marked "1".  Remove white label marked "2".  Rub patch adhesive wings for 2 additional minutes.   While looking in a mirror, press and release button in center of patch.  A small green light will flash 3-4 times .  This will be your only indicator the monitor has been turned on.     Do not shower for the first 24 hours.  You may shower after the first 24 hours.   Press button if you feel a symptom. You will hear a small click.  Record Date, Time and Symptom in the Patient Log Book.   When you are ready to remove patch, follow instructions on last 2 pages of Patient Log Book.  Stick patch monitor onto last page of Patient Log Book.   Place Patient Log Book in [employer] box.  Use locking tab on box and tape box closed securely.  The [employer] has [employer] on it.  Please place in mailbox as soon as possible.  Your physician should have your test results approximately 7 days after the monitor has been mailed back to [facility].   Call [place] at [phone] if you have questions regarding your ZIO XT patch monitor.  Call them immediately if you see an orange light blinking on your monitor.   If your monitor falls off in less than 4 days contact our Monitor department at  [phone].  If your monitor becomes loose or falls off after 4 days call [employer] at [phone] for suggestions on securing your monitor.   Follow-Up: At [facility], you and your health needs are our priority.  As part of our continuing mission to provide you with exceptional heart care, we have created designated Provider Care Teams.  These Care Teams include your primary Cardiologist (physician) and Advanced Practice Providers (APPs -  Physician Assistants and Nurse Practitioners) who all work together to provide you with the care you need, when you need it.  We recommend signing up for the patient  portal called "MyChart".  Sign up information is provided on this After Visit Summary.  MyChart is used to connect with patients for Virtual Visits (Telemedicine).  Patients are able to view lab/test results, encounter notes, upcoming appointments, etc.  Non-urgent messages can be sent to your provider as well.   To learn more about what you can do with MyChart, go to [email or web address].    Your next appointment:   2 weeks with pharmacist (HF med titration) 2 months with Dr. Gardiner Rhyme   Other Instructions You have been referred to: Neurology

## 2021-01-17 ENCOUNTER — Telehealth: Payer: Self-pay | Admitting: Cardiology

## 2021-01-17 NOTE — Telephone Encounter (Signed)
Spoke with [name] (pt's daughter on [place]) regarding preferred weekdays and times for scheduling the ordered Cardiac MRI.  WIll contact patient with appointment once we have received the prior authorization from the insurance company.  [name] voiced her understanding.

## 2021-01-18 DIAGNOSIS — R001 Bradycardia, unspecified: Secondary | ICD-10-CM

## 2021-01-19 ENCOUNTER — Encounter: Payer: Self-pay | Admitting: Cardiology

## 2021-01-19 ENCOUNTER — Telehealth: Payer: Self-pay | Admitting: Cardiology

## 2021-01-19 DIAGNOSIS — E782 Mixed hyperlipidemia: Secondary | ICD-10-CM | POA: Diagnosis not present

## 2021-01-19 DIAGNOSIS — I77819 Aortic ectasia, unspecified site: Secondary | ICD-10-CM | POA: Diagnosis not present

## 2021-01-19 DIAGNOSIS — M810 Age-related osteoporosis without current pathological fracture: Secondary | ICD-10-CM | POA: Diagnosis not present

## 2021-01-19 DIAGNOSIS — R7303 Prediabetes: Secondary | ICD-10-CM | POA: Diagnosis not present

## 2021-01-19 DIAGNOSIS — Z7982 Long term (current) use of aspirin: Secondary | ICD-10-CM | POA: Diagnosis not present

## 2021-01-19 DIAGNOSIS — J449 Chronic obstructive pulmonary disease, unspecified: Secondary | ICD-10-CM | POA: Diagnosis not present

## 2021-01-19 DIAGNOSIS — I110 Hypertensive heart disease with heart failure: Secondary | ICD-10-CM | POA: Diagnosis not present

## 2021-01-19 DIAGNOSIS — E559 Vitamin D deficiency, unspecified: Secondary | ICD-10-CM | POA: Diagnosis not present

## 2021-01-19 DIAGNOSIS — I5041 Acute combined systolic (congestive) and diastolic (congestive) heart failure: Secondary | ICD-10-CM | POA: Diagnosis not present

## 2021-01-19 DIAGNOSIS — I428 Other cardiomyopathies: Secondary | ICD-10-CM | POA: Diagnosis not present

## 2021-01-19 DIAGNOSIS — J42 Unspecified chronic bronchitis: Secondary | ICD-10-CM | POA: Diagnosis not present

## 2021-01-19 DIAGNOSIS — I5042 Chronic combined systolic (congestive) and diastolic (congestive) heart failure: Secondary | ICD-10-CM | POA: Diagnosis not present

## 2021-01-19 DIAGNOSIS — H538 Other visual disturbances: Secondary | ICD-10-CM | POA: Diagnosis not present

## 2021-01-19 DIAGNOSIS — I10 Essential (primary) hypertension: Secondary | ICD-10-CM | POA: Diagnosis not present

## 2021-01-19 DIAGNOSIS — I2510 Atherosclerotic heart disease of native coronary artery without angina pectoris: Secondary | ICD-10-CM | POA: Diagnosis not present

## 2021-01-19 DIAGNOSIS — F32A Depression, unspecified: Secondary | ICD-10-CM | POA: Diagnosis not present

## 2021-01-19 DIAGNOSIS — E785 Hyperlipidemia, unspecified: Secondary | ICD-10-CM | POA: Diagnosis not present

## 2021-01-19 NOTE — Telephone Encounter (Signed)
*  STAT* If patient is at the pharmacy, call can be transferred to refill team.   1. Which medications need to be refilled? (please list name of each medication and dose if known) Losartan, Isosorbide, Hydralazine and Spironolactone   2. Which pharmacy/location (including street and city if local pharmacy) is medication to be sent to? [facility]  3. Do they need a 30 day or 90 day supply? *90 days and refills

## 2021-01-19 NOTE — Telephone Encounter (Signed)
Spoke with patient regarding the Monday 02/28/21 11:00 am Cardiac MRI appointment at [facility]---arrival time is 10:30 am--1st floor admissions office for check in.  Will mail information to patient and it is available in My Chart.  Patient voiced her understanding.

## 2021-01-20 ENCOUNTER — Telehealth: Payer: Self-pay | Admitting: Cardiology

## 2021-01-20 DIAGNOSIS — F32A Depression, unspecified: Secondary | ICD-10-CM | POA: Diagnosis not present

## 2021-01-20 DIAGNOSIS — J449 Chronic obstructive pulmonary disease, unspecified: Secondary | ICD-10-CM | POA: Diagnosis not present

## 2021-01-20 DIAGNOSIS — E785 Hyperlipidemia, unspecified: Secondary | ICD-10-CM | POA: Diagnosis not present

## 2021-01-20 DIAGNOSIS — I2510 Atherosclerotic heart disease of native coronary artery without angina pectoris: Secondary | ICD-10-CM | POA: Diagnosis not present

## 2021-01-20 DIAGNOSIS — I428 Other cardiomyopathies: Secondary | ICD-10-CM | POA: Diagnosis not present

## 2021-01-20 DIAGNOSIS — I77819 Aortic ectasia, unspecified site: Secondary | ICD-10-CM | POA: Diagnosis not present

## 2021-01-20 DIAGNOSIS — I5041 Acute combined systolic (congestive) and diastolic (congestive) heart failure: Secondary | ICD-10-CM | POA: Diagnosis not present

## 2021-01-20 DIAGNOSIS — E559 Vitamin D deficiency, unspecified: Secondary | ICD-10-CM | POA: Diagnosis not present

## 2021-01-20 DIAGNOSIS — I110 Hypertensive heart disease with heart failure: Secondary | ICD-10-CM | POA: Diagnosis not present

## 2021-01-20 DIAGNOSIS — Z7982 Long term (current) use of aspirin: Secondary | ICD-10-CM | POA: Diagnosis not present

## 2021-01-20 DIAGNOSIS — H538 Other visual disturbances: Secondary | ICD-10-CM | POA: Diagnosis not present

## 2021-01-20 DIAGNOSIS — R7303 Prediabetes: Secondary | ICD-10-CM | POA: Diagnosis not present

## 2021-01-20 NOTE — Telephone Encounter (Signed)
Follow up:     Patient calling to check the status of her refill. Please advise.

## 2021-01-21 ENCOUNTER — Encounter: Payer: Self-pay | Admitting: Neurology

## 2021-01-31 ENCOUNTER — Ambulatory Visit (INDEPENDENT_AMBULATORY_CARE_PROVIDER_SITE_OTHER): Payer: Medicare Other

## 2021-01-31 ENCOUNTER — Other Ambulatory Visit: Payer: Self-pay

## 2021-01-31 VITALS — BP 120/72 | HR 72

## 2021-01-31 DIAGNOSIS — I5042 Chronic combined systolic (congestive) and diastolic (congestive) heart failure: Secondary | ICD-10-CM

## 2021-01-31 DIAGNOSIS — I5041 Acute combined systolic (congestive) and diastolic (congestive) heart failure: Secondary | ICD-10-CM

## 2021-01-31 LAB — BASIC METABOLIC PANEL
BUN/Creatinine Ratio: 14 (ref 12–28)
BUN: 10 mg/dL (ref 8–27)
CO2: 24 mmol/L (ref 20–29)
Calcium: 9.3 mg/dL (ref 8.7–10.3)
Chloride: 103 mmol/L (ref 96–106)
Creatinine, Ser: 0.71 mg/dL (ref 0.57–1.00)
Glucose: 93 mg/dL (ref 65–99)
Potassium: 3.8 mmol/L (ref 3.5–5.2)
Sodium: 141 mmol/L (ref 134–144)
eGFR: 84 mL/min/{1.73_m2} (ref >59)

## 2021-01-31 NOTE — Patient Instructions (Addendum)
Thank you for visiting [place] in clinic today!  We will draw blood for labs today and give you a call with the results tomorrow.   Based on your lab results, we may increase your losartan to 50 mg daily. We will let you know during that follow-up call.  Medications: -Losartan 25 mg daily -Imdur 30 mg daily -Hydralazine 10 mg TID -Spironolactone 25 mg daily -Metoprolol succinate 25 mg daily  If you have any questions or concerns, please give [place] a call at [phone]

## 2021-01-31 NOTE — Progress Notes (Signed)
Patient ID: [name]                 DOB: 07-28-1934                      MRN: [id number]     HPI: [name] is a 87 y.o. female referred by Dr. Gardiner Rhyme to pharmacy clinic for HF medication management. PMH is significant for chronic combined systolic and diastolic HF (EF 07-37% on ECHO 12/16/20), HTN, COPD. Most recent LVEF 25-30% on 12/16/20.  Today she returns to pharmacy clinic with her son for further medication titration. At last visit with MD on 3/25 she was started on metoprolol succinate 25 mg daily and continued on her previous HF medications. Symptomatically, she is feeling well, and denies dizziness, lightheadedness, SOB, or fatigue. No chest pain or palpitations. Able to complete all ADLs. Activity level fair; she is able to ambulate up and down the hall with a cane. She frequently checks her weight at home (normal range ~110 lbs, today 111.6 lbs) with the help of the [employer]. Denies LEE, PND, or orthopnea. Appetite has been good and without change since hospital admission. She mostly adheres to a low-salt diet, and does not add additional salt to her food. Her in-clinic BP was 120/82, HR 72.   Current CHF meds: losartan 25 mg daily, Imdur 30 mg daily, hydralazine 10 mg TID, spironolactone 25 mg daily, metoprolol succinate 25 mg daily  BP goal: <130/80 mmHg  Diet: Does not add additional salt to food.   Exercise: Limited due to mobility, but able to ambulate up and down hall with aid of cane/   Home BP readings: Checked multiples per day by [employer], has been running well according to patient.  In clinic readings: HR 76, BP 120/82   Wt Readings from Last 3 Encounters:  01/14/21 112 lb (50.8 kg)  12/27/20 106 lb 11.2 oz (48.4 kg)  04/16/18 132 lb 3.2 oz (60 kg)   BP Readings from Last 3 Encounters:  01/14/21 126/73  12/27/20 125/64  04/16/18 130/70   Pulse Readings from Last 3 Encounters:  01/14/21 72  12/27/20 67  04/16/18 (!) 59     Renal function: CrCl cannot be calculated (Patient's most recent lab result is older than the maximum 21 days allowed.).  Past Medical History:  Diagnosis Date  . Acute combined systolic and diastolic CHF, NYHA class 4 ([place])   . Anxiety   . Arthritis    knees, hands   . Cancer (HCC)    SQUAMOuS CELL CARCINOMA OF SKIN- PERIANAL   . COPD (chronic obstructive pulmonary disease) ([place])    smoker  . Depression   . Elbow fracture, right 2013  . Hyperlipidemia   . Hypertension   . Osteoporosis   . Pneumonia    hx of   . S/P radiation therapy 04/08/2014-05/14/2014   Right perianal skin / 45 Gy in 25 fractions  . Wears glasses     Current Outpatient Medications on File Prior to Visit  Medication Sig Dispense Refill  . alendronate (FOSAMAX) 70 MG tablet Take 70 mg by mouth once a week.    . ALPRAZolam (XANAX) 0.25 MG tablet Take 0.25 mg by mouth 2 (two) times daily as needed for anxiety. For anxiety    . aspirin EC 81 MG EC tablet Take 1 tablet (81 mg total) by mouth at bedtime. Swallow whole. 30 tablet 11  . docusate sodium (COLACE)  100 MG capsule Take 100 mg by mouth daily as needed.    Marland Kitchen guaiFENesin (MUCINEX) 600 MG 12 hr tablet Take 2 tablets (1,200 mg total) by mouth 2 (two) times daily. 30 tablet 0  . hydrALAZINE (APRESOLINE) 10 MG tablet Take 1 tablet (10 mg total) by mouth every 8 (eight) hours. 90 tablet 0  . isosorbide mononitrate (IMDUR) 30 MG 24 hr tablet Take 1 tablet (30 mg total) by mouth daily. 30 tablet 0  . losartan (COZAAR) 25 MG tablet Take 1 tablet (25 mg total) by mouth daily. 30 tablet 0  . lovastatin (MEVACOR) 20 MG tablet Take 20 mg by mouth at bedtime.    . metoprolol succinate (TOPROL XL) 25 MG 24 hr tablet Take 1 tablet (25 mg total) by mouth daily. 90 tablet 3  . Multiple Vitamins-Minerals (OCUVITE PRESERVISION PO) Take by mouth 2 (two) times daily.    Marland Kitchen spironolactone (ALDACTONE) 25 MG tablet Take 25 mg by mouth daily.    Marland Kitchen venlafaxine (EFFEXOR-XR) 75 MG  24 hr capsule Take 75 mg by mouth daily after breakfast.    . vitamin B-12 100 MCG tablet Take 1 tablet (100 mcg total) by mouth daily. 30 tablet 0   No current facility-administered medications on file prior to visit.    Allergies  Allergen Reactions  . Prolia [Denosumab] Other (See Comments)    Severe pain, could not fuction    Assessment/Plan:  1. CHF - Patient continues to tolerate current HF medication regimen with room to optimize therapy for improved cardiovascular outcomes. Patient encouraged to continue with work on diet and exercise as she is able. Will obtain a BMET today and call patient tomorrow with results. Patient asked we call her son, Meliana Canner [phone]), with the results. Discussed potentially increasing her losartan to 50 mg daily if her lab results are normal. Eventually will likely want to consider Entresto or an SGLT2 inhibitor. Per HF stewardship pharmacist during recent admission, Entresto or Wilder Glade would be $47 a month for either.   Continue HF medications: -Losartan 25 mg daily -Imdur 30 mg daily -Hydralazine 10 mg TID -Spironolactone 25 mg daily -Metoprolol succinate 25 mg daily  Claudina Lick, PharmD PGY1 Acute Care Pharmacy Resident

## 2021-01-31 NOTE — Telephone Encounter (Signed)
Unsure why none of these refills have been sent in, separate phone note from the following day where pt called asking on status of refills. No documentation or follow up in either encounter.

## 2021-02-01 ENCOUNTER — Telehealth: Payer: Self-pay | Admitting: Pharmacist

## 2021-02-01 MED ORDER — LOSARTAN POTASSIUM 50 MG PO TABS: 50.0000 mg | ORAL_TABLET | Freq: Every day | ORAL | 5 refills | Status: DC

## 2021-02-01 NOTE — Telephone Encounter (Signed)
BMET stable, pt is aware. Reports vitals were just checked by home RN, BP was 150/85, HR 59. Will increase losartan to 50mg  daily to further optimize CHF meds. Will follow up in clinic in 2-3 weeks to recheck vitals/labs and continue optimizing CHF meds.

## 2021-02-07 ENCOUNTER — Telehealth: Payer: Self-pay | Admitting: Cardiology

## 2021-02-07 NOTE — Telephone Encounter (Signed)
I called patient daughter- she states that her mother has gained 10 lbs since Saturday. I advised of how she was doing any swelling, patient daughter did not know, unknown is SOB or chest pains, she is going to go over and check on her tonight and she states she will call back tomorrow on how she was doing with the answers to these questions.

## 2021-02-07 NOTE — Telephone Encounter (Signed)
Pt c/o swelling: STAT is pt has developed SOB within 24 hours  1) How much weight have you gained and in what time span? 10 pounds since Saturday  2) If swelling, where is the swelling located? Daughter states the patient doesn't look swollen anywhere   3) Are you currently taking a fluid pill? yes  4) Are you currently SOB? Not to the Daughter's knowledge. Patient is at home with a sitter and that information has not been reported to her   5) Do you have a log of your daily weights (if so, list)? Information was not provided   6) Have you gained 3 pounds in a day or 5 pounds in a week? 10 lbs since Saturday  7) Have you traveled recently? no

## 2021-02-08 ENCOUNTER — Telehealth: Payer: Self-pay | Admitting: Cardiology

## 2021-02-08 MED ORDER — SPIRONOLACTONE 25 MG PO TABS: 25.0000 mg | ORAL_TABLET | Freq: Every day | ORAL | 11 refills | Status: DC

## 2021-02-08 MED ORDER — ISOSORBIDE MONONITRATE ER 30 MG PO TB24: 30.0000 mg | ORAL_TABLET | Freq: Every day | ORAL | 11 refills | Status: DC

## 2021-02-08 MED ORDER — HYDRALAZINE HCL 10 MG PO TABS: 10.0000 mg | ORAL_TABLET | Freq: Three times a day (TID) | ORAL | 11 refills | Status: DC

## 2021-02-08 NOTE — Telephone Encounter (Signed)
*  STAT* If patient is at the pharmacy, call can be transferred to refill team.   1. Which medications need to be refilled? (please list name of each medication and dose if known)  isosorbide mononitrate (IMDUR) 30 MG 24 hr tablet hydrALAZINE (APRESOLINE) 10 MG tablet spironolactone (ALDACTONE) 25 MG tablet  2. Which pharmacy/location (including street and city if local pharmacy) is medication to be sent to? [facility] - [place], [place] - [place] [address]  3. Do they need a 30 day or 90 day supply? 30 day   Patient has been out of medication for the past 2 days

## 2021-02-21 NOTE — Progress Notes (Unsigned)
Patient ID: MARCILE FUQUAY                 DOB: 14-May-1934                      MRN: [id number]     HPI: [name] is a 87 y.o. female referred by Dr. Gardiner Rhyme to pharmacy clinic for HF medication management. PMH is significant for chronic combined systolic and diastolic HF (EF 08-65% on ECHO 12/16/20), HTN, COPD. Most recent LVEF 25-30% on 12/16/20. Pt last seen by pharmacy on 02/01/20 and BP was 120/72. On 4/12, pt reported home BP was 150/85 and HR 59. Losartan was increased to 50 mg daily to further optimize HF regimen. Additionally, pt reported Delene Loll is cost prohibitive. On 4/18, pt reported SOB and gained 10 lbs within 2 days, however pt was out of spironolactone, hydralazine, and Imdur or 2 days as well. Refills were provided.   BMET  Today   Patient presents today in good spirits. Reports ***  Weight today =  Clinic BP? Compliance? Took meds this today? When do you take your meds? Dizziness, headaches, SOB, blurred vision? History of swelling? Weight changes? -clinic weight = -checking weight at home?? (normal range ~110 lbs, today 111.6 lbs) -salt, fluid restrictions (no more than 2L a day) Home BP logs?  Go over BP goals (<130/80) Talk about additional BP/CHF therapy if needed  . Increase losartan to 100 mg daily pending labs . Increase Toprol XL to 50 mg daily . Cost prohibitive - Add Entresto 49/51 mg BID (discontinue losartan) - $47 pending labs . Cost prohibitive - Add Farxiga - $47 pending labs . Increase spiro - pending labs Diet Exercise??    Today she returns to pharmacy clinic for further medication titration. At last visit with MD ***. Symptomatically, she is feeling ***, *** dizziness, lightheadedness, and fatigue. *** chest pain or palpitations. Feels SOB when ***. Able to complete all ADLs. Activity level ***. She *** checks her weight at home (normal range *** - *** lbs). *** LEE, PND, or orthopnea. Appetite has been ***. She *** adheres to a low-salt  diet.  Current CHF meds: losartan 50 mg daily, Imdur 30 mg daily, hydralazine 10 mg TID, spironolactone 25 mg daily, metoprolol succinate 25 mg daily  BP goal: <130/80 mmHg  Diet: Does not add additional salt to food.   Exercise: Limited due to mobility, but able to ambulate up and down hall with aid of cane/   Family History:   Social History: Former Smoker (Quit: 12/15/2020 - 0.5 ppd x50 years)  Home BP readings:   Labs: 01/31/21: Scr 0.71, eGFR 84, Na 141, K 3.8  Wt Readings from Last 3 Encounters:  01/14/21 112 lb (50.8 kg)  12/27/20 106 lb 11.2 oz (48.4 kg)  04/16/18 132 lb 3.2 oz (60 kg)   BP Readings from Last 3 Encounters:  02/01/21 120/72  01/14/21 126/73  12/27/20 125/64   Pulse Readings from Last 3 Encounters:  02/01/21 72  01/14/21 72  12/27/20 67    Renal function: CrCl cannot be calculated (Patient's most recent lab result is older than the maximum 21 days allowed.).  Past Medical History:  Diagnosis Date  . Acute combined systolic and diastolic CHF, NYHA class 4 ([place])   . Anxiety   . Arthritis    knees, hands   . Cancer (HCC)    SQUAMOuS CELL CARCINOMA OF SKIN- PERIANAL   . COPD (chronic obstructive  pulmonary disease) ([place])    smoker  . Depression   . Elbow fracture, right 2013  . Hyperlipidemia   . Hypertension   . Osteoporosis   . Pneumonia    hx of   . S/P radiation therapy 04/08/2014-05/14/2014   Right perianal skin / 45 Gy in 25 fractions  . Wears glasses     Current Outpatient Medications on File Prior to Visit  Medication Sig Dispense Refill  . alendronate (FOSAMAX) 70 MG tablet Take 70 mg by mouth once a week.    . ALPRAZolam (XANAX) 0.25 MG tablet Take 0.25 mg by mouth 2 (two) times daily as needed for anxiety. For anxiety    . aspirin EC 81 MG EC tablet Take 1 tablet (81 mg total) by mouth at bedtime. Swallow whole. 30 tablet 11  . docusate sodium (COLACE) 100 MG capsule Take 100 mg by mouth daily as needed.    Marland Kitchen guaiFENesin  (MUCINEX) 600 MG 12 hr tablet Take 2 tablets (1,200 mg total) by mouth 2 (two) times daily. 30 tablet 0  . hydrALAZINE (APRESOLINE) 10 MG tablet Take 1 tablet (10 mg total) by mouth every 8 (eight) hours. 90 tablet 11  . isosorbide mononitrate (IMDUR) 30 MG 24 hr tablet Take 1 tablet (30 mg total) by mouth daily. 30 tablet 11  . losartan (COZAAR) 50 MG tablet Take 1 tablet (50 mg total) by mouth daily. 30 tablet 5  . lovastatin (MEVACOR) 20 MG tablet Take 20 mg by mouth at bedtime.    . metoprolol succinate (TOPROL XL) 25 MG 24 hr tablet Take 1 tablet (25 mg total) by mouth daily. 90 tablet 3  . Multiple Vitamins-Minerals (OCUVITE PRESERVISION PO) Take by mouth 2 (two) times daily.    Marland Kitchen spironolactone (ALDACTONE) 25 MG tablet Take 1 tablet (25 mg total) by mouth daily. 30 tablet 11  . venlafaxine (EFFEXOR-XR) 75 MG 24 hr capsule Take 75 mg by mouth daily after breakfast.    . vitamin B-12 100 MCG tablet Take 1 tablet (100 mcg total) by mouth daily. 30 tablet 0   No current facility-administered medications on file prior to visit.    Allergies  Allergen Reactions  . Prolia [Denosumab] Other (See Comments)    Severe pain, could not fuction     Assessment/Plan:  1. CHF -   Lorel Monaco, PharmD, BCPS PGY2 Ambulatory Care Resident [place]

## 2021-02-22 ENCOUNTER — Other Ambulatory Visit (HOSPITAL_COMMUNITY): Payer: Self-pay

## 2021-02-22 ENCOUNTER — Ambulatory Visit: Payer: Medicare Other

## 2021-02-22 NOTE — Progress Notes (Signed)
Patient ID: [name]                 DOB: 10/01/1934                      MRN: [id number]     HPI: [name] is a 87 y.o. female referred by Dr. Gardiner Rhyme to pharmacy clinic for HF medication management. PMH is significant for chronic combined systolic and diastolic HF (EF 16-60% on echo 12/16/20), HTN, and COPD. Pt last seen by pharmacy on 02/01/20 and BP was 120/72. On 4/12, pt reported home BP was 150/85 and HR 59. Losartan was increased to 50 mg daily to further optimize HF regimen. Additionally, pt reported [name] is cost prohibitive. On 4/18, pt reported SOB and gained 10 lbs within 2 days, however pt was out of spironolactone, hydralazine, and Imdur or 2 days as well. Refills were provided.   Pt presents today in good spirits with her son. Reports tolerating medications well. Denies dizziness, headaches, and swelling. Has an RN [name] who helps as her caretaker. Checks her BP daily which has ranged 140-160 and also fills her pill box. Splits her daily meds into AM and PM, isn't sure when she takes each of them though.  Current CHF meds:  Metoprolol succinate 25 mg daily Losartan 50 mg daily Spironolactone 25 mg daily Imdur 30 mg daily Hydralazine 10 mg TID - 8am, 4pm, 10pm  BP goal: <130/80 mmHg  Diet: Does not add additional salt to food.   Exercise: Limited due to mobility, but able to ambulate up and down hall with aid of cane  Family History:  Family History  Problem Relation Age of Onset  . Hypertension Mother   . Cancer Father        lung  . Arthritis Father   . Cancer Sister 41       colon   . Cancer Child 72       thyroid    Social History: Former Smoker (Quit: 12/15/2020 - 0.5 ppd x50 years)  Home BP readings:   Labs: 01/31/21: Scr 0.71, eGFR 84, Na 141, K 3.8  Wt Readings from Last 3 Encounters:  01/14/21 112 lb (50.8 kg)  12/27/20 106 lb 11.2 oz (48.4 kg)  04/16/18 132 lb 3.2 oz (60 kg)   BP Readings from Last 3 Encounters:  02/01/21 120/72   01/14/21 126/73  12/27/20 125/64   Pulse Readings from Last 3 Encounters:  02/01/21 72  01/14/21 72  12/27/20 67    Renal function: CrCl cannot be calculated (Patient's most recent lab result is older than the maximum 21 days allowed.).  Past Medical History:  Diagnosis Date  . Acute combined systolic and diastolic CHF, NYHA class 4 ([place])   . Anxiety   . Arthritis    knees, hands   . Cancer (HCC)    SQUAMOuS CELL CARCINOMA OF SKIN- PERIANAL   . COPD (chronic obstructive pulmonary disease) ([place])    smoker  . Depression   . Elbow fracture, right 2013  . Hyperlipidemia   . Hypertension   . Osteoporosis   . Pneumonia    hx of   . S/P radiation therapy 04/08/2014-05/14/2014   Right perianal skin / 45 Gy in 25 fractions  . Wears glasses     Current Outpatient Medications on File Prior to Visit  Medication Sig Dispense Refill  . alendronate (FOSAMAX) 70 MG tablet Take 70 mg by mouth once a week.    Marland Kitchen  ALPRAZolam (XANAX) 0.25 MG tablet Take 0.25 mg by mouth 2 (two) times daily as needed for anxiety. For anxiety    . aspirin EC 81 MG EC tablet Take 1 tablet (81 mg total) by mouth at bedtime. Swallow whole. 30 tablet 11  . docusate sodium (COLACE) 100 MG capsule Take 100 mg by mouth daily as needed.    Marland Kitchen guaiFENesin (MUCINEX) 600 MG 12 hr tablet Take 2 tablets (1,200 mg total) by mouth 2 (two) times daily. 30 tablet 0  . hydrALAZINE (APRESOLINE) 10 MG tablet Take 1 tablet (10 mg total) by mouth every 8 (eight) hours. 90 tablet 11  . isosorbide mononitrate (IMDUR) 30 MG 24 hr tablet Take 1 tablet (30 mg total) by mouth daily. 30 tablet 11  . losartan (COZAAR) 50 MG tablet Take 1 tablet (50 mg total) by mouth daily. 30 tablet 5  . lovastatin (MEVACOR) 20 MG tablet Take 20 mg by mouth at bedtime.    . metoprolol succinate (TOPROL XL) 25 MG 24 hr tablet Take 1 tablet (25 mg total) by mouth daily. 90 tablet 3  . Multiple Vitamins-Minerals (OCUVITE PRESERVISION PO) Take by mouth 2 (two)  times daily.    Marland Kitchen spironolactone (ALDACTONE) 25 MG tablet Take 1 tablet (25 mg total) by mouth daily. 30 tablet 11  . venlafaxine (EFFEXOR-XR) 75 MG 24 hr capsule Take 75 mg by mouth daily after breakfast.    . vitamin B-12 100 MCG tablet Take 1 tablet (100 mcg total) by mouth daily. 30 tablet 0   No current facility-administered medications on file prior to visit.    Allergies  Allergen Reactions  . Prolia [Denosumab] Other (See Comments)    Severe pain, could not fuction     Assessment/Plan:  1. CHF - BP well controlled in clinic at goal <130/20mHg, still have room to optimize CHF regimen. Will increase Toprol to 555mdaily to target HR closer to 60. Continue spironolactone 25 mg daily, Imdur 30 mg daily, and hydralazine 10 mg TID. [name] cost prohibitive for pt. Will continue losartan 5022maily for now, checking BMET today with recent dose increase, and can hopefully continue titrating at next MD visit scheduled in 3 weeks. Follow up with PharmD after if needed.  Denise Genest E. [name], PharmD, BCACP, [id number] Taylor AvenuereKentwoodC [id number]: [phone]: ([phone]/2022 12:14 PM

## 2021-02-24 ENCOUNTER — Ambulatory Visit (INDEPENDENT_AMBULATORY_CARE_PROVIDER_SITE_OTHER): Payer: Medicare Other | Admitting: Pharmacist

## 2021-02-24 ENCOUNTER — Other Ambulatory Visit: Payer: Self-pay

## 2021-02-24 VITALS — BP 126/70 | HR 70 | Wt 113.2 lb

## 2021-02-24 DIAGNOSIS — I5041 Acute combined systolic (congestive) and diastolic (congestive) heart failure: Secondary | ICD-10-CM

## 2021-02-24 DIAGNOSIS — I5042 Chronic combined systolic (congestive) and diastolic (congestive) heart failure: Secondary | ICD-10-CM | POA: Diagnosis not present

## 2021-02-24 LAB — BASIC METABOLIC PANEL
BUN/Creatinine Ratio: 16 (ref 12–28)
BUN: 10 mg/dL (ref 8–27)
CO2: 23 mmol/L (ref 20–29)
Calcium: 9.7 mg/dL (ref 8.7–10.3)
Chloride: 103 mmol/L (ref 96–106)
Creatinine, Ser: 0.62 mg/dL (ref 0.57–1.00)
Glucose: 84 mg/dL (ref 65–99)
Potassium: 3.6 mmol/L (ref 3.5–5.2)
Sodium: 141 mmol/L (ref 134–144)
eGFR: 88 mL/min/{1.73_m2} (ref >59)

## 2021-02-24 MED ORDER — METOPROLOL SUCCINATE ER 50 MG PO TB24: 50.0000 mg | ORAL_TABLET | Freq: Every day | ORAL | 3 refills | Status: DC

## 2021-02-24 NOTE — Patient Instructions (Addendum)
It was nice to see you today  Your blood pressure is excellent and at goal < 130/60mmHg   Increase your metoprolol from 25mg  to 50mg  once daily. You can double up and take 2 of your 25mg  tablets for now. When you run out, the pharmacy will refill the higher dose of 50mg  tablets so you can go back to taking 1 tablet once daily  Continue taking your other medications  Bring in a copy of your blood pressure readings and heart rate to your next visit with Dr Gardiner Rhyme. If we can, we'd like to keep increase the dose of either your metoprolol or losartan

## 2021-02-25 ENCOUNTER — Telehealth (HOSPITAL_COMMUNITY): Payer: Self-pay | Admitting: *Deleted

## 2021-02-25 NOTE — Telephone Encounter (Signed)
Attempted to call patient regarding upcoming cardiac MRI appointment. Left message on voicemail with name and callback number  Griselda Tosh RN Navigator Cardiac Imaging [facility] [phone] Office [phone] Cell  

## 2021-02-28 ENCOUNTER — Ambulatory Visit (HOSPITAL_COMMUNITY)
Admission: RE | Admit: 2021-02-28 | Discharge: 2021-02-28 | Disposition: A | Discharge status: Home / Self Care | Payer: Medicare Other | Source: Ambulatory Visit | Attending: Cardiology | Admitting: Cardiology

## 2021-02-28 ENCOUNTER — Other Ambulatory Visit: Payer: Self-pay

## 2021-02-28 DIAGNOSIS — I5042 Chronic combined systolic (congestive) and diastolic (congestive) heart failure: Secondary | ICD-10-CM

## 2021-02-28 MED ORDER — GADOBUTROL 1 MMOL/ML IV SOLN
5.0000 mL | Freq: Once | INTRAVENOUS | Status: AC | PRN
  Administered 2021-02-28: 5 mL via INTRAVENOUS

## 2021-03-21 NOTE — Progress Notes (Deleted)
Cardiology Office Note:    Date:  03/22/2021   ID:  [name], DOB 1934/05/01, MRN [id number]  PCP:  Marda Stalker, PA-C  Cardiologist:  Donato Heinz, MD  Electrophysiologist:  None   Referring MD: Hulan Fess, MD   No chief complaint on file.   History of Present Illness:    [name] is a 87 y.o. female with a hx of chronic combined systolic and diastolic heart failure hypertension, COPD who presents for hospital follow-up.  She was admitted to [facility] from 2/23 through 12/27/2020.  Had presented with shortness of breath with significantly elevated BP, and found to be significantly volume overloaded.  Echocardiogram on 12/16/20 showed EF 25 to 54%, grade 1 diastolic dysfunction, normal RV function, severe left atrial dilatation, moderate MR. RHC/LHC on 12/17/2020 showed minimal nonobstructive CAD, RA 4, RV 22/3, PA 32/11/22, PW 16, LVEDP 24.  Discharged on hydralazine 10 mg 3 times daily, Imdur 30 mg daily, losartan 25 mg daily, Aldactone 12.5 mg daily.  She developed AKI due to IV diuresis, was not discharged on diuresis.  Cardiac MRI on 02/28/2021 showed moderate LV dilatation with moderate systolic dysfunction (EF 62%), normal RV size and systolic function (EF 70%), RV insertion site LGE.  Zio patch x3 days on 01/26/2021 showed one 6 beat episode of NSVT, occasional PVCs (1.1%).  Since last clinic visit,  discharge from the hospital, she reports that she is feeling improved.  She denies any chest pain or dyspnea.  Denies any syncope, lightheadedness, lower extremity edema, or palpitations.  Reports weight has been stable.  Daughters report she has been having cognitive issues/memory loss.   Wt Readings from Last 3 Encounters:  03/22/21 114 lb 3.2 oz (51.8 kg)  02/24/21 113 lb 4 oz (51.4 kg)  01/14/21 112 lb (50.8 kg)     Past Medical History:  Diagnosis Date  . Acute combined systolic and diastolic CHF, NYHA class 4 ([place])   . Anxiety   . Arthritis    knees,  hands   . Cancer (HCC)    SQUAMOuS CELL CARCINOMA OF SKIN- PERIANAL   . COPD (chronic obstructive pulmonary disease) ([place])    smoker  . Depression   . Elbow fracture, right 2013  . Hyperlipidemia   . Hypertension   . Osteoporosis   . Pneumonia    hx of   . S/P radiation therapy 04/08/2014-05/14/2014   Right perianal skin / 45 Gy in 25 fractions  . Wears glasses     Past Surgical History:  Procedure Laterality Date  . ABDOMINAL HYSTERECTOMY     TOTAL VAGINAL HYSTERECTOMY  . APPENDECTOMY    . CHOLECYSTECTOMY    . ELBOW FRACTURE SURGERY  2013   rt  . EVALUATION UNDER ANESTHESIA WITH HEMORRHOIDECTOMY N/A 05/29/2016   Procedure: EXAM UNDER ANESTHESIA WITH INTERNAL AND EXTERNAL HEMORRHOIDECTOMY;  Surgeon: Georganna Skeans, MD;  Location: [place];  Service: General;  Laterality: N/A;  . EYE SURGERY     left eye cataract surgery   . HEMORRHOID SURGERY N/A 02/06/2014   Procedure: EXCISION PERIANAL MASS;  Surgeon: Zenovia Jarred, MD;  Location: [place];  Service: General;  Laterality: N/A;  . HERNIA REPAIR     left inguinal hernia repair   . I & D EXTREMITY Left 03/05/2013   Procedure: Irrigation and Debridement Olecranon Bursa/Left Elbow;  Surgeon: Linna Hoff, MD;  Location: [place];  Service: Orthopedics;  Laterality: Left;  . JOINT REPLACEMENT  right and left hip replacement   . MASS EXCISION N/A 05/24/2015   Procedure: EXCISION ANAL MASS X 2;  Surgeon: Georganna Skeans, MD;  Location: [place];  Service: General;  Laterality: N/A;  . OTHER SURGICAL HISTORY     arthroscopic right knee   . OTHER SURGICAL HISTORY     right carpal tunnel  . OTHER SURGICAL HISTORY     right elbow surgery   . OTHER SURGICAL HISTORY     oophorectomy 1958  . RECTAL BIOPSY N/A 05/29/2016   Procedure: ANAL BIOPSY;  Surgeon: Georganna Skeans, MD;  Location: [place];  Service: General;  Laterality: N/A;  . RIGHT/LEFT HEART CATH AND CORONARY  ANGIOGRAPHY N/A 12/17/2020   Procedure: RIGHT/LEFT HEART CATH AND CORONARY ANGIOGRAPHY;  Surgeon: Martinique, Peter M, MD;  Location: [place] [facility];  Service: Cardiovascular;  Laterality: N/A;  . TOTAL KNEE ARTHROPLASTY  12/13/2011   Procedure: TOTAL KNEE ARTHROPLASTY;  Surgeon: Tobi Bastos, MD;  [facility];  Service: Orthopedics;  Laterality: Right;    Current Medications: Current Meds  Medication Sig  . alendronate (FOSAMAX) 70 MG tablet Take 70 mg by mouth once a week.  . ALPRAZolam (XANAX) 0.25 MG tablet Take 0.25 mg by mouth 2 (two) times daily as needed for anxiety. For anxiety  . aspirin EC 81 MG EC tablet Take 1 tablet (81 mg total) by mouth at bedtime. Swallow whole.  . docusate sodium (COLACE) 100 MG capsule Take 100 mg by mouth daily as needed.  Marland Kitchen guaiFENesin (MUCINEX) 600 MG 12 hr tablet Take 2 tablets (1,200 mg total) by mouth 2 (two) times daily.  . hydrALAZINE (APRESOLINE) 10 MG tablet Take 1 tablet (10 mg total) by mouth every 8 (eight) hours.  . isosorbide mononitrate (IMDUR) 30 MG 24 hr tablet Take 1 tablet (30 mg total) by mouth daily.  Marland Kitchen losartan (COZAAR) 50 MG tablet Take 1 tablet (50 mg total) by mouth daily.  Marland Kitchen lovastatin (MEVACOR) 20 MG tablet Take 20 mg by mouth at bedtime.  . metoprolol succinate (TOPROL-XL) 50 MG 24 hr tablet Take 1 tablet (50 mg total) by mouth daily. Take with or immediately following a meal.  . Multiple Vitamins-Minerals (OCUVITE PRESERVISION PO) Take by mouth 2 (two) times daily.  Marland Kitchen spironolactone (ALDACTONE) 25 MG tablet Take 1 tablet (25 mg total) by mouth daily.  Marland Kitchen venlafaxine (EFFEXOR-XR) 75 MG 24 hr capsule Take 75 mg by mouth daily after breakfast.  . vitamin B-12 100 MCG tablet Take 1 tablet (100 mcg total) by mouth daily.     Allergies:   Prolia [denosumab]   Social History   Socioeconomic History  . Marital status: Widowed    Spouse name: Not on file  . Number of children: Not on file  . Years of education: Not on  file  . Highest education level: Not on file  Occupational History  . Not on file  Tobacco Use  . Smoking status: Former Smoker    Packs/day: 0.50    Years: 50.00    Pack years: 25.00    Types: Cigarettes    Quit date: 12/15/2020    Years since quitting: 0.2  . Smokeless tobacco: Never Used  Vaping Use  . Vaping Use: Never used  Substance and Sexual Activity  . Alcohol use: No  . Drug use: No  . Sexual activity: Not Currently  Other Topics Concern  . Not on file  Social History Narrative  . Not  on file   Social Determinants of Health   Financial Resource Strain: Low Risk   . Difficulty of Paying Living Expenses: Not very hard  Food Insecurity: No Food Insecurity  . Worried About Charity fundraiser in the Last Year: Never true  . Ran Out of Food in the Last Year: Never true  Transportation Needs: No Transportation Needs  . Lack of Transportation (Medical): No  . Lack of Transportation (Non-Medical): No  Physical Activity: Inactive  . Days of Exercise per Week: 0 days  . Minutes of Exercise per Session: 0 min  Stress: Not on file  Social Connections: Not on file     Family History: The patient's family history includes Arthritis in her father; Cancer in her father; Cancer (age of onset: 93) in her child; Cancer (age of onset: 70) in her sister; Hypertension in her mother.  ROS:   Please see the history of present illness.    All other systems reviewed and are negative.  EKGs/Labs/Other Studies Reviewed:    The following studies were reviewed today:   EKG:  EKG is not ordered today.    Recent Labs: 12/15/2020: B Natriuretic Peptide 3,235.0; TSH 3.422 12/26/2020: Hemoglobin 13.5; Platelets 192 12/27/2020: Magnesium 1.8 02/24/2021: BUN 10; Creatinine, Ser 0.62; Potassium 3.6; Sodium 141  Recent Lipid Panel No results found for: CHOL, TRIG, HDL, CHOLHDL, VLDL, LDLCALC, LDLDIRECT  Physical Exam:    VS:  BP 140/90   Pulse 72   Ht 4\' 11"  (1.499 m)   Wt 114 lb 3.2  oz (51.8 kg)   SpO2 96%   BMI 23.07 kg/m     Wt Readings from Last 3 Encounters:  03/22/21 114 lb 3.2 oz (51.8 kg)  02/24/21 113 lb 4 oz (51.4 kg)  01/14/21 112 lb (50.8 kg)     GEN: Well nourished, well developed in no acute distress HEENT: Normal NECK: No JVD; No carotid bruits LYMPHATICS: No lymphadenopathy CARDIAC: RRR, no murmurs, rubs, gallops RESPIRATORY:  Clear to auscultation without rales, wheezing or rhonchi  ABDOMEN: Soft, non-tender, non-distended MUSCULOSKELETAL:  No edema; No deformity  SKIN: Warm and dry NEUROLOGIC:  Alert and oriented x 3 PSYCHIATRIC:  Normal affect   ASSESSMENT:    No diagnosis found. PLAN:    Chronic combined systolic and diastolic heart failure: Echocardiogram on 12/16/20 showed EF 25 to 13%, grade 1 diastolic dysfunction, normal RV function, severe left atrial dilatation, moderate MR. RHC/LHC on 12/17/2020 showed minimal nonobstructive CAD, RA 4, RV 22/3, PA 32/11/22, PW 16, LVEDP 24.  Cardiac MRI on 02/28/2021 showed moderate LV dilatation with moderate systolic dysfunction (EF 24%), normal RV size and systolic function (EF 40%), RV insertion site LGE. Appears euvolemic. -Continue losartan 50 mg daily -Continue Imdur 30 mg daily/hydralazine 10 mg 3 times daily -Continue spironolactone 25 mg daily -Entresto and Farxiga are cost prohibitive -Continue Toprol-XL 50 mg daily.  This was not started during her hospitalization due to reported intermittent bradycardia.  Zio patch x3 days on 01/26/2021 showed one 6 beat episode of NSVT, occasional PVCs (1.1%), no issues with bradycardia, average heart rate 76 bpm.  Hypertension: Continue losartan, Imdur/hydralazine, spironolactone, Toprol-XL as above.    RTC in ***  Medication Adjustments/Labs and Tests Ordered: Current medicines are reviewed at length with the patient today.  Concerns regarding medicines are outlined above.  No orders of the defined types were placed in this encounter.  No orders of  the defined types were placed in this encounter.   There  are no Patient Instructions on file for this visit.   Signed, Donato Heinz, MD  03/22/2021 9:31 AM    [facility]

## 2021-03-22 ENCOUNTER — Ambulatory Visit: Payer: Medicare Other | Admitting: Cardiology

## 2021-03-22 ENCOUNTER — Encounter: Payer: Self-pay | Admitting: Cardiology

## 2021-03-22 ENCOUNTER — Other Ambulatory Visit: Payer: Self-pay

## 2021-03-22 VITALS — BP 140/90 | HR 72 | Ht 59.0 in | Wt 114.2 lb

## 2021-03-22 DIAGNOSIS — I5042 Chronic combined systolic (congestive) and diastolic (congestive) heart failure: Secondary | ICD-10-CM

## 2021-03-22 DIAGNOSIS — I10 Essential (primary) hypertension: Secondary | ICD-10-CM

## 2021-03-22 LAB — BASIC METABOLIC PANEL
BUN/Creatinine Ratio: 19 (ref 12–28)
BUN: 12 mg/dL (ref 8–27)
CO2: 24 mmol/L (ref 20–29)
Calcium: 9.7 mg/dL (ref 8.7–10.3)
Chloride: 105 mmol/L (ref 96–106)
Creatinine, Ser: 0.63 mg/dL (ref 0.57–1.00)
Glucose: 88 mg/dL (ref 65–99)
Potassium: 3.7 mmol/L (ref 3.5–5.2)
Sodium: 144 mmol/L (ref 134–144)
eGFR: 87 mL/min/{1.73_m2} (ref >59)

## 2021-03-22 MED ORDER — LOSARTAN POTASSIUM 100 MG PO TABS: 100.0000 mg | ORAL_TABLET | Freq: Every day | ORAL | 3 refills | Status: DC

## 2021-03-22 NOTE — Progress Notes (Signed)
Cardiology Office Note:    Date:  03/23/2021   ID:  [name], DOB 12/13/1933, MRN [id number]  PCP:  Marda Stalker, PA-C  Cardiologist:  Donato Heinz, MD  Electrophysiologist:  None   Referring MD: Hulan Fess, MD   Chief Complaint  Patient presents with  . Congestive Heart Failure    History of Present Illness:    [name] is a 87 y.o. female with a hx of chronic combined systolic and diastolic heart failure hypertension, COPD who presents for hospital follow-up.  She was admitted to [facility] from 2/23 through 12/27/2020.  Had presented with shortness of breath with significantly elevated BP, and found to be significantly volume overloaded.  Echocardiogram on 12/16/20 showed EF 25 to 27%, grade 1 diastolic dysfunction, normal RV function, severe left atrial dilatation, moderate MR. RHC/LHC on 12/17/2020 showed minimal nonobstructive CAD, RA 4, RV 22/3, PA 32/11/22, PW 16, LVEDP 24.  Discharged on hydralazine 10 mg 3 times daily, Imdur 30 mg daily, losartan 25 mg daily, Aldactone 12.5 mg daily.  She developed AKI due to IV diuresis, was not discharged on diuresis.  Cardiac MRI on 02/28/2021 showed moderate LV dilatation with moderate systolic dysfunction (EF 25%), normal RV size and systolic function (EF 36%), RV insertion site LGE.  Zio patch x3 days on 01/26/2021 showed one 6 beat episode of NSVT, occasional PVCs (1.1%).   Today, she is doing well. She states that she has been checking her blood pressure daily and it has been in its baseline range. She reports that she participates in short walks in her drive way every day and twice a day on occasion. When she is walking she experiences leg muscle cramps but denies any chest pain or SOB. She denies any chest pain, SOB, lightheadedness, syncope, or lower extremity edema.     Wt Readings from Last 3 Encounters:  03/22/21 114 lb 3.2 oz (51.8 kg)  02/24/21 113 lb 4 oz (51.4 kg)  01/14/21 112 lb (50.8 kg)     Past Medical  History:  Diagnosis Date  . Acute combined systolic and diastolic CHF, NYHA class 4 ([place])   . Anxiety   . Arthritis    knees, hands   . Cancer (HCC)    SQUAMOuS CELL CARCINOMA OF SKIN- PERIANAL   . COPD (chronic obstructive pulmonary disease) ([place])    smoker  . Depression   . Elbow fracture, right 2013  . Hyperlipidemia   . Hypertension   . Osteoporosis   . Pneumonia    hx of   . S/P radiation therapy 04/08/2014-05/14/2014   Right perianal skin / 45 Gy in 25 fractions  . Wears glasses     Past Surgical History:  Procedure Laterality Date  . ABDOMINAL HYSTERECTOMY     TOTAL VAGINAL HYSTERECTOMY  . APPENDECTOMY    . CHOLECYSTECTOMY    . ELBOW FRACTURE SURGERY  2013   rt  . EVALUATION UNDER ANESTHESIA WITH HEMORRHOIDECTOMY N/A 05/29/2016   Procedure: EXAM UNDER ANESTHESIA WITH INTERNAL AND EXTERNAL HEMORRHOIDECTOMY;  Surgeon: Georganna Skeans, MD;  Location: [place];  Service: General;  Laterality: N/A;  . EYE SURGERY     left eye cataract surgery   . HEMORRHOID SURGERY N/A 02/06/2014   Procedure: EXCISION PERIANAL MASS;  Surgeon: Zenovia Jarred, MD;  Location: [place];  Service: General;  Laterality: N/A;  . HERNIA REPAIR     left inguinal hernia repair   . I & D EXTREMITY  Left 03/05/2013   Procedure: Irrigation and Debridement Olecranon Bursa/Left Elbow;  Surgeon: Linna Hoff, MD;  Location: [place];  Service: Orthopedics;  Laterality: Left;  . JOINT REPLACEMENT     right and left hip replacement   . MASS EXCISION N/A 05/24/2015   Procedure: EXCISION ANAL MASS X 2;  Surgeon: Georganna Skeans, MD;  Location: [place];  Service: General;  Laterality: N/A;  . OTHER SURGICAL HISTORY     arthroscopic right knee   . OTHER SURGICAL HISTORY     right carpal tunnel  . OTHER SURGICAL HISTORY     right elbow surgery   . OTHER SURGICAL HISTORY     oophorectomy 1958  . RECTAL BIOPSY N/A 05/29/2016   Procedure: ANAL BIOPSY;  Surgeon:  Georganna Skeans, MD;  Location: [place];  Service: General;  Laterality: N/A;  . RIGHT/LEFT HEART CATH AND CORONARY ANGIOGRAPHY N/A 12/17/2020   Procedure: RIGHT/LEFT HEART CATH AND CORONARY ANGIOGRAPHY;  Surgeon: Martinique, Peter M, MD;  Location: [place] [facility];  Service: Cardiovascular;  Laterality: N/A;  . TOTAL KNEE ARTHROPLASTY  12/13/2011   Procedure: TOTAL KNEE ARTHROPLASTY;  Surgeon: Tobi Bastos, MD;  [facility];  Service: Orthopedics;  Laterality: Right;    Current Medications: Current Meds  Medication Sig  . alendronate (FOSAMAX) 70 MG tablet Take 70 mg by mouth once a week.  . ALPRAZolam (XANAX) 0.25 MG tablet Take 0.25 mg by mouth 2 (two) times daily as needed for anxiety. For anxiety  . aspirin EC 81 MG EC tablet Take 1 tablet (81 mg total) by mouth at bedtime. Swallow whole.  . docusate sodium (COLACE) 100 MG capsule Take 100 mg by mouth daily as needed.  Marland Kitchen guaiFENesin (MUCINEX) 600 MG 12 hr tablet Take 2 tablets (1,200 mg total) by mouth 2 (two) times daily.  . hydrALAZINE (APRESOLINE) 10 MG tablet Take 1 tablet (10 mg total) by mouth every 8 (eight) hours.  . isosorbide mononitrate (IMDUR) 30 MG 24 hr tablet Take 1 tablet (30 mg total) by mouth daily.  Marland Kitchen losartan (COZAAR) 100 MG tablet Take 1 tablet (100 mg total) by mouth daily.  Marland Kitchen lovastatin (MEVACOR) 20 MG tablet Take 20 mg by mouth at bedtime.  . metoprolol succinate (TOPROL-XL) 50 MG 24 hr tablet Take 1 tablet (50 mg total) by mouth daily. Take with or immediately following a meal.  . Multiple Vitamins-Minerals (OCUVITE PRESERVISION PO) Take by mouth 2 (two) times daily.  Marland Kitchen spironolactone (ALDACTONE) 25 MG tablet Take 1 tablet (25 mg total) by mouth daily.  Marland Kitchen venlafaxine (EFFEXOR-XR) 75 MG 24 hr capsule Take 75 mg by mouth daily after breakfast.  . vitamin B-12 100 MCG tablet Take 1 tablet (100 mcg total) by mouth daily.  . [DISCONTINUED] losartan (COZAAR) 50 MG tablet Take 1 tablet (50 mg  total) by mouth daily.     Allergies:   Prolia [denosumab]   Social History   Socioeconomic History  . Marital status: Widowed    Spouse name: Not on file  . Number of children: Not on file  . Years of education: Not on file  . Highest education level: Not on file  Occupational History  . Not on file  Tobacco Use  . Smoking status: Former Smoker    Packs/day: 0.50    Years: 50.00    Pack years: 25.00    Types: Cigarettes    Quit date: 12/15/2020    Years since quitting: 0.2  .  Smokeless tobacco: Never Used  Vaping Use  . Vaping Use: Never used  Substance and Sexual Activity  . Alcohol use: No  . Drug use: No  . Sexual activity: Not Currently  Other Topics Concern  . Not on file  Social History Narrative  . Not on file   Social Determinants of Health   Financial Resource Strain: Low Risk   . Difficulty of Paying Living Expenses: Not very hard  Food Insecurity: No Food Insecurity  . Worried About Charity fundraiser in the Last Year: Never true  . Ran Out of Food in the Last Year: Never true  Transportation Needs: No Transportation Needs  . Lack of Transportation (Medical): No  . Lack of Transportation (Non-Medical): No  Physical Activity: Inactive  . Days of Exercise per Week: 0 days  . Minutes of Exercise per Session: 0 min  Stress: Not on file  Social Connections: Not on file     Family History: The patient's family history includes Arthritis in her father; Cancer in her father; Cancer (age of onset: 81) in her child; Cancer (age of onset: 27) in her sister; Hypertension in her mother.  ROS:   Please see the history of present illness.    All other systems reviewed and are negative.  EKGs/Labs/Other Studies Reviewed:    The following studies were reviewed today:   EKG:   03/22/2021- EKG is not ordered today.    Recent Labs: 12/15/2020: B Natriuretic Peptide 3,235.0; TSH 3.422 12/26/2020: Hemoglobin 13.5; Platelets 192 12/27/2020: Magnesium  1.8 03/22/2021: BUN 12; Creatinine, Ser 0.63; Potassium 3.7; Sodium 144  Recent Lipid Panel No results found for: CHOL, TRIG, HDL, CHOLHDL, VLDL, LDLCALC, LDLDIRECT  Physical Exam:    VS:  BP 140/90   Pulse 72   Ht 4\' 11"  (1.499 m)   Wt 114 lb 3.2 oz (51.8 kg)   SpO2 96%   BMI 23.07 kg/m     Wt Readings from Last 3 Encounters:  03/22/21 114 lb 3.2 oz (51.8 kg)  02/24/21 113 lb 4 oz (51.4 kg)  01/14/21 112 lb (50.8 kg)     GEN: Well nourished, well developed in no acute distress HEENT: Normal NECK: No JVD; No carotid bruits LYMPHATICS: scattered crackles at left base CARDIAC: RRR, no murmurs, rubs, gallops RESPIRATORY:  Clear to auscultation without rales, wheezing or rhonchi  ABDOMEN: Soft, non-tender, non-distended MUSCULOSKELETAL:  No edema; No deformity  SKIN: Warm and dry NEUROLOGIC:  Alert and oriented x 3 PSYCHIATRIC:  Normal affect   ASSESSMENT:    1. Chronic combined systolic (congestive) and diastolic (congestive) heart failure ([place])   2. Essential hypertension    PLAN:    Chronic combined systolic and diastolic heart failure: Echocardiogram on 12/16/20 showed EF 25 to 62%, grade 1 diastolic dysfunction, normal RV function, severe left atrial dilatation, moderate MR. RHC/LHC on 12/17/2020 showed minimal nonobstructive CAD, RA 4, RV 22/3, PA 32/11/22, PW 16, LVEDP 24.  Cardiac MRI on 02/28/2021 showed moderate LV dilatation with moderate systolic dysfunction (EF 69%), normal RV size and systolic function (EF 48%), RV insertion site LGE. Appears euvolemic. -Continue losartan, will increase to 100 mg daily.  Check BMP -Continue Imdur 30 mg daily/hydralazine 10 mg 3 times daily -Continue spironolactone 25 mg daily -Entresto and Farxiga are cost prohibitive -Continue Toprol-XL 50 mg daily.  This was not started during her hospitalization due to reported intermittent bradycardia.  Zio patch x3 days on 01/26/2021 showed one 6 beat episode of NSVT, occasional  PVCs (1.1%), no  issues with bradycardia, average heart rate 76 bpm.  Hypertension: Continue losartan, Imdur/hydralazine, spironolactone, Toprol-XL as above.    RTC in 2 months  Medication Adjustments/Labs and Tests Ordered: Current medicines are reviewed at length with the patient today.  Concerns regarding medicines are outlined above.  Orders Placed This Encounter  Procedures  . Basic metabolic panel   Meds ordered this encounter  Medications  . losartan (COZAAR) 100 MG tablet    Sig: Take 1 tablet (100 mg total) by mouth daily.    Dispense:  90 tablet    Refill:  3    Dose increase    Patient Instructions  Medication Instructions:  INCREASE losartan to 100 mg once daily  *If you need a refill on your cardiac medications before your next appointment, please call your pharmacy*   Lab Work: BMET today AND please return for labs in 2 weeks (BMET)  Our in office lab hours are Monday-Friday 8:00-4:00, closed for lunch 12:45-1:45 pm.  No appointment needed.  Follow-Up: At [facility], you and your health needs are our priority.  As part of our continuing mission to provide you with exceptional heart care, we have created designated Provider Care Teams.  These Care Teams include your primary Cardiologist (physician) and Advanced Practice Providers (APPs -  Physician Assistants and Nurse Practitioners) who all work together to provide you with the care you need, when you need it.  We recommend signing up for the patient portal called "MyChart".  Sign up information is provided on this After Visit Summary.  MyChart is used to connect with patients for Virtual Visits (Telemedicine).  Patients are able to view lab/test results, encounter notes, upcoming appointments, etc.  Non-urgent messages can be sent to your provider as well.   To learn more about what you can do with MyChart, go to [email or web address].    Your next appointment:   2 month(s)  The format for your next appointment:   In  Person  Provider:   Oswaldo Milian, MD         Ella Bodo as a scribe for Donato Heinz, MD.,have documented all relevant documentation on the behalf of Donato Heinz, MD,as directed by  Donato Heinz, MD while in the presence of Donato Heinz, MD.  I, Donato Heinz, MD, have reviewed all documentation for this visit. The documentation on 03/23/21 for the exam, diagnosis, procedures, and orders are all accurate and complete.   Signed, Donato Heinz, MD  03/23/2021 11:40 PM    [place]

## 2021-03-22 NOTE — Patient Instructions (Signed)
Medication Instructions:  INCREASE losartan to 100 mg once daily  *If you need a refill on your cardiac medications before your next appointment, please call your pharmacy*   Lab Work: BMET today AND please return for labs in 2 weeks (BMET)  Our in office lab hours are Monday-Friday 8:00-4:00, closed for lunch 12:45-1:45 pm.  No appointment needed.  Follow-Up: At [facility], you and your health needs are our priority.  As part of our continuing mission to provide you with exceptional heart care, we have created designated Provider Care Teams.  These Care Teams include your primary Cardiologist (physician) and Advanced Practice Providers (APPs -  Physician Assistants and Nurse Practitioners) who all work together to provide you with the care you need, when you need it.  We recommend signing up for the patient portal called "MyChart".  Sign up information is provided on this After Visit Summary.  MyChart is used to connect with patients for Virtual Visits (Telemedicine).  Patients are able to view lab/test results, encounter notes, upcoming appointments, etc.  Non-urgent messages can be sent to your provider as well.   To learn more about what you can do with MyChart, go to [email or web address].    Your next appointment:   2 month(s)  The format for your next appointment:   In Person  Provider:   Oswaldo Milian, MD

## 2021-03-23 ENCOUNTER — Other Ambulatory Visit: Payer: Self-pay | Admitting: *Deleted

## 2021-03-23 DIAGNOSIS — I10 Essential (primary) hypertension: Secondary | ICD-10-CM

## 2021-03-23 DIAGNOSIS — I5042 Chronic combined systolic (congestive) and diastolic (congestive) heart failure: Secondary | ICD-10-CM

## 2021-03-23 DIAGNOSIS — Z79899 Other long term (current) drug therapy: Secondary | ICD-10-CM

## 2021-04-05 LAB — BASIC METABOLIC PANEL
BUN/Creatinine Ratio: 17 (ref 12–28)
BUN: 11 mg/dL (ref 8–27)
CO2: 24 mmol/L (ref 20–29)
Calcium: 9.4 mg/dL (ref 8.7–10.3)
Chloride: 103 mmol/L (ref 96–106)
Creatinine, Ser: 0.66 mg/dL (ref 0.57–1.00)
Glucose: 122 mg/dL — ABNORMAL HIGH (ref 65–99)
Potassium: 3.7 mmol/L (ref 3.5–5.2)
Sodium: 144 mmol/L (ref 134–144)
eGFR: 86 mL/min/{1.73_m2} (ref >59)

## 2021-04-19 ENCOUNTER — Encounter: Payer: Self-pay | Admitting: Neurology

## 2021-04-19 ENCOUNTER — Ambulatory Visit: Payer: Medicare Other | Admitting: Neurology

## 2021-04-19 ENCOUNTER — Other Ambulatory Visit (INDEPENDENT_AMBULATORY_CARE_PROVIDER_SITE_OTHER): Payer: Medicare Other

## 2021-04-19 ENCOUNTER — Other Ambulatory Visit: Payer: Self-pay

## 2021-04-19 VITALS — BP 149/89 | HR 107 | Ht 61.0 in | Wt 114.0 lb

## 2021-04-19 DIAGNOSIS — F0390 Unspecified dementia without behavioral disturbance: Secondary | ICD-10-CM

## 2021-04-19 DIAGNOSIS — F03A0 Unspecified dementia, mild, without behavioral disturbance, psychotic disturbance, mood disturbance, and anxiety: Secondary | ICD-10-CM

## 2021-04-19 LAB — VITAMIN B12: Vitamin B-12: 514 pg/mL (ref 211–911)

## 2021-04-19 NOTE — Patient Instructions (Signed)
Schedule MRI brain without contrast  2. Bloodwork for B12  3. Please let me know if you would like to start Namenda to help slow down worsening of memory loss, side effects can include drowsiness, dizziness  4. Continue close supervision  5. Follow-up in 6 months, call for any changes   FALL PRECAUTIONS: Be cautious when walking. Scan the area for obstacles that may increase the risk of trips and falls. When getting up in the mornings, sit up at the edge of the bed for a few minutes before getting out of bed. Consider elevating the bed at the head end to avoid drop of blood pressure when getting up. Walk always in a well-lit room (use night lights in the walls). Avoid area rugs or power cords from appliances in the middle of the walkways. Use a walker or a cane if necessary and consider physical therapy for balance exercise. Get your eyesight checked regularly.  HOME SAFETY: Consider the safety of the kitchen when operating appliances like stoves, microwave oven, and blender. Consider having supervision and share cooking responsibilities until no longer able to participate in those. Accidents with firearms and other hazards in the house should be identified and addressed as well.   ABILITY TO BE LEFT ALONE: If patient is unable to contact 911 operator, consider using LifeLine, or when the need is there, arrange for someone to stay with patients. Smoking is a fire hazard, consider supervision or cessation. Risk of wandering should be assessed by caregiver and if detected at any point, supervision and safe proof recommendations should be instituted.  MEDICATION SUPERVISION: Inability to self-administer medication needs to be constantly addressed. Implement a mechanism to ensure safe administration of the medications.  RECOMMENDATIONS FOR ALL PATIENTS WITH MEMORY PROBLEMS: 1. Continue to exercise (Recommend 30 minutes of walking everyday, or 3 hours every week) 2. Increase social interactions -  continue going to [place] and enjoy social gatherings with friends and family 3. Eat healthy, avoid fried foods and eat more fruits and vegetables 4. Maintain adequate blood pressure, blood sugar, and blood cholesterol level. Reducing the risk of stroke and cardiovascular disease also helps promoting better memory. 5. Avoid stressful situations. Live a simple life and avoid aggravations. Organize your time and prepare for the next day in anticipation. 6. Sleep well, avoid any interruptions of sleep and avoid any distractions in the bedroom that may interfere with adequate sleep quality 7. Avoid sugar, avoid sweets as there is a strong link between excessive sugar intake, diabetes, and cognitive impairment We discussed the Mediterranean diet, which has been shown to help patients reduce the risk of progressive memory disorders and reduces cardiovascular risk. This includes eating fish, eat fruits and green leafy vegetables, nuts like almonds and hazelnuts, walnuts, and also use olive oil. Avoid fast foods and fried foods as much as possible. Avoid sweets and sugar as sugar use has been linked to worsening of memory function.  There is always a concern of gradual progression of memory problems. If this is the case, then we may need to adjust level of care according to patient needs. Support, both to the patient and caregiver, should then be put into place.

## 2021-04-19 NOTE — Progress Notes (Signed)
NEUROLOGY CONSULTATION NOTE  REEVE MALLO MRN: [id number] DOB: 03-21-1934  Referring provider: Dr. Oswaldo Milian Primary care provider: Marda Stalker, PA-C  Reason for consult:  memory loss   Dear Dr. Gardiner Rhyme,  Thank you for your kind referral of [name] for consultation of the above symptoms. Although her history is well known to you, please allow me to reiterate it for the purpose of our medical record. The patient was accompanied to the clinic by her daughter [name] who also provides collateral information. Records and images were personally reviewed where available.   HISTORY OF PRESENT ILLNESS: This is an 87 year old right-handed woman with a history of hypertension, hyperlipidemia, squamous cell CA s/p radiation tx, COPD, CHF, presenting for evaluation of memory loss. She feels her memory "ain't too good." [name] was noticing slight changes but memory changes became much more noticeable after her hospitalization 4 months ago for CHF and significant hypertension. Since then, memory changes were "all the time, daily." She would repeat herself, asking the same questions repeatedly. She is slower to remember things. She would misplace her credit card and checkbook, saying she knows [name] took it. [name] lives next door and has been fixing her pillbox even before the hospitalization. She was previously taking her own medications, but since hospitalization has aides staying from 7am to 11pm to remind her to take them. [name] took over finances after hospitalization and noticed she had double paid quite a few bills in the past 9 months. She stopped driving 3-5 years ago due to vision loss in the right eye. Around the time she was hospitalized, she forgot she was cooking food on the stove. She was previously independent with dressing and bathing, now aides are on standby and make sure she takes a shower. She states she is able to prepare her own food using the microwave. No  paranoia or hallucinations, but [name] feels she is a little more depressed than before. She states mood is "just flat," she gets frustrated sometimes. Sleep is much better, no wandering behaviors.   She denies any headaches, dizziness, diplopia, dysarthria, dysphagia, neck/back pain, focal numbness/tingling/weakness, bowel/bladder dysfunction. No anosmia, tremors, no falls. She always ambulates with a cane for balance. No family history of dementia, no history of significant head injuries or alcohol use.   Laboratory Data: Lab Results  Component Value Date   TSH 3.422 12/15/2020   Lab Results  Component Value Date   [id number] 017 12/18/2020      PAST MEDICAL HISTORY: Past Medical History:  Diagnosis Date   Acute combined systolic and diastolic CHF, NYHA class 4 (HCC)    Anxiety    Arthritis    knees, hands    Cancer (HCC)    SQUAMOuS CELL CARCINOMA OF SKIN- PERIANAL    COPD (chronic obstructive pulmonary disease) ([place])    smoker   Depression    Elbow fracture, right 2013   Hyperlipidemia    Hypertension    Osteoporosis    Pneumonia    hx of    S/P radiation therapy 04/08/2014-05/14/2014   Right perianal skin / 45 Gy in 25 fractions   Wears glasses     PAST SURGICAL HISTORY: Past Surgical History:  Procedure Laterality Date   ABDOMINAL HYSTERECTOMY     TOTAL VAGINAL HYSTERECTOMY   APPENDECTOMY     CHOLECYSTECTOMY     ELBOW FRACTURE SURGERY  2013   rt   EVALUATION UNDER ANESTHESIA WITH HEMORRHOIDECTOMY N/A 05/29/2016   Procedure: Jasmine December  UNDER ANESTHESIA WITH INTERNAL AND EXTERNAL HEMORRHOIDECTOMY;  Surgeon: Georganna Skeans, MD;  Location: [facility];  Service: General;  Laterality: N/A;   EYE SURGERY     left eye cataract surgery    HEMORRHOID SURGERY N/A 02/06/2014   Procedure: EXCISION PERIANAL MASS;  Surgeon: Zenovia Jarred, MD;  Location: [facility];  Service: General;  Laterality: N/A;   HERNIA REPAIR     left inguinal hernia repair     I & D EXTREMITY Left 03/05/2013   Procedure: Irrigation and Debridement Olecranon Bursa/Left Elbow;  Surgeon: Linna Hoff, MD;  Location: [facility];  Service: Orthopedics;  Laterality: Left;   JOINT REPLACEMENT     right and left hip replacement    MASS EXCISION N/A 05/24/2015   Procedure: EXCISION ANAL MASS X 2;  Surgeon: Georganna Skeans, MD;  Location: [facility];  Service: General;  Laterality: N/A;   OTHER SURGICAL HISTORY     arthroscopic right knee    OTHER SURGICAL HISTORY     right carpal tunnel   OTHER SURGICAL HISTORY     right elbow surgery    OTHER SURGICAL HISTORY     oophorectomy 1958   RECTAL BIOPSY N/A 05/29/2016   Procedure: ANAL BIOPSY;  Surgeon: Georganna Skeans, MD;  Location: [facility];  Service: General;  Laterality: N/A;   RIGHT/LEFT HEART CATH AND CORONARY ANGIOGRAPHY N/A 12/17/2020   Procedure: RIGHT/LEFT HEART CATH AND CORONARY ANGIOGRAPHY;  Surgeon: Martinique, Peter M, MD;  Location: [facility];  Service: Cardiovascular;  Laterality: N/A;   TOTAL KNEE ARTHROPLASTY  12/13/2011   Procedure: TOTAL KNEE ARTHROPLASTY;  Surgeon: Tobi Bastos, MD;  Location: [facility];  Service: Orthopedics;  Laterality: Right;    MEDICATIONS: Current Outpatient Medications on File Prior to Visit  Medication Sig Dispense Refill   alendronate (FOSAMAX) 70 MG tablet Take 70 mg by mouth once a week.     ALPRAZolam (XANAX) 0.25 MG tablet Take 0.25 mg by mouth 2 (two) times daily as needed for anxiety. For anxiety     aspirin EC 81 MG EC tablet Take 1 tablet (81 mg total) by mouth at bedtime. Swallow whole. 30 tablet 11   docusate sodium (COLACE) 100 MG capsule Take 100 mg by mouth daily as needed.     guaiFENesin (MUCINEX) 600 MG 12 hr tablet Take 2 tablets (1,200 mg total) by mouth 2 (two) times daily. 30 tablet 0   hydrALAZINE (APRESOLINE) 10 MG tablet Take 1 tablet (10 mg total) by mouth every 8 (eight) hours. 90 tablet 11   isosorbide mononitrate  (IMDUR) 30 MG 24 hr tablet Take 1 tablet (30 mg total) by mouth daily. 30 tablet 11   losartan (COZAAR) 100 MG tablet Take 1 tablet (100 mg total) by mouth daily. 90 tablet 3   lovastatin (MEVACOR) 20 MG tablet Take 20 mg by mouth at bedtime.     metoprolol succinate (TOPROL-XL) 50 MG 24 hr tablet Take 1 tablet (50 mg total) by mouth daily. Take with or immediately following a meal. 90 tablet 3   Multiple Vitamins-Minerals (OCUVITE PRESERVISION PO) Take by mouth 2 (two) times daily.     spironolactone (ALDACTONE) 25 MG tablet Take 1 tablet (25 mg total) by mouth daily. 30 tablet 11   venlafaxine (EFFEXOR-XR) 75 MG 24 hr capsule Take 75 mg by mouth daily after breakfast.     vitamin B-12 100 MCG tablet Take 1 tablet (100 mcg  total) by mouth daily. 30 tablet 0   No current facility-administered medications on file prior to visit.    ALLERGIES: Allergies  Allergen Reactions   Prolia [Denosumab] Other (See Comments)    Severe pain, could not fuction    FAMILY HISTORY: Family History  Problem Relation Age of Onset   Hypertension Mother    Cancer Father        lung   Arthritis Father    Cancer Sister 62       colon    Cancer Child 68       thyroid    SOCIAL HISTORY: Social History   Socioeconomic History   Marital status: Widowed    Spouse name: Not on file   Number of children: Not on file   Years of education: Not on file   Highest education level: Not on file  Occupational History   Not on file  Tobacco Use   Smoking status: Former    Packs/day: 0.50    Years: 50.00    Pack years: 25.00    Types: Cigarettes    Quit date: 12/15/2020    Years since quitting: 0.3   Smokeless tobacco: Never  Vaping Use   Vaping Use: Never used  Substance and Sexual Activity   Alcohol use: No   Drug use: No   Sexual activity: Not Currently  Other Topics Concern   Not on file  Social History Narrative   Not on file   Social Determinants of Health   Financial Resource Strain: Low  Risk    Difficulty of Paying Living Expenses: Not very hard  Food Insecurity: No Food Insecurity   Worried About Running Out of Food in the Last Year: Never true   [place] in the Last Year: Never true  Transportation Needs: No Transportation Needs   Lack of Transportation (Medical): No   Lack of Transportation (Non-Medical): No  Physical Activity: Inactive   Days of Exercise per Week: 0 days   Minutes of Exercise per Session: 0 min  Stress: Not on file  Social Connections: Not on file  Intimate Partner Violence: Not on file     PHYSICAL EXAM: Vitals:   04/19/21 0905  BP: (!) 149/89  Pulse: (!) 107  SpO2: 96%   General: No acute distress Head:  Normocephalic/atraumatic Skin/Extremities: No rash, no edema Neurological Exam: Mental status: alert and oriented to person, place, and time, no dysarthria or aphasia, Fund of knowledge is appropriate.  Recent and remote memory are impaired.  Attention and concentration are reduced.  Able to name objects and repeat phrases. Difficulty with visuospatial/executive function tasks. [place] score 16/30. Montreal Cognitive Assessment  04/19/2021  Visuospatial/ Executive (0/5) 1  Naming (0/3) 3  Attention: Read list of digits (0/2) 2  Attention: Read list of letters (0/1) 0  Attention: Serial 7 subtraction starting at 100 (0/3) 2  Language: Repeat phrase (0/2) 2  Language : Fluency (0/1) 0  Abstraction (0/2) 0  Delayed Recall (0/5) 0  Orientation (0/6) 5  Total 15  Adjusted Score (based on education) 16    Cranial nerves: CN I: not tested CN II: pupils equal, round and reactive to light, visual fields intact CN III, IV, VI:  full range of motion, no nystagmus, no ptosis CN V: facial sensation intact CN VII: upper and lower face symmetric CN VIII: hearing intact to conversation CN XI: sternocleidomastoid and trapezius muscles intact CN XII: tongue midline Bulk & Tone: normal, no fasciculations. Motor:  5/5 throughout with no  pronator drift. Sensation: intact to light touch, cold, pin, vibration sense.  No extinction to double simultaneous stimulation. Deep Tendon Reflexes: +2 throughout, negative Hoffman sign Cerebellar: no incoordination on finger to nose testing Gait: slow and cautious with cane, no ataxia Tremor: none   IMPRESSION: This is an 87 year old right-handed woman with a history of hypertension, hyperlipidemia, squamous cell CA s/p radiation tx, COPD, CHF, presenting for evaluation of memory loss that became more apparent after hospitalization for significant hypertension and CHF 4 months ago. Her neurological exam is non-focal, MOCA score today 16/30. They report difficulties with complex tasks, we discussed history and exam today concerning for mild dementia, etiology possibly vascular. MRI brain without contrast will be ordered to assess for underlying structural abnormality and assess vascular load. Check B12 level, with neurological symptoms, would keep level above 400. Also discussed how mood can affect memory, they will monitor and discuss mood management with PCP. We discussed medications used in dementia, including expectations and side effects, they will discuss and call our office if she would like to start Memantine 5mg  qhs. We discussed the importance of home safety, fall safety. Continue control of vascular risk factors, monitoring medications, close supervision. Follow-up in 6 months, they know to call for any changes.    Thank you for allowing me to participate in the care of this patient. Please do not hesitate to call for any questions or concerns.   Ellouise Newer, M.D.  CC: Dr. Gardiner Rhyme, Marda Stalker, PA-C

## 2021-05-02 NOTE — Progress Notes (Signed)
Left message of the above

## 2021-05-07 ENCOUNTER — Ambulatory Visit
Admission: RE | Admit: 2021-05-07 | Discharge: 2021-05-07 | Disposition: A | Discharge status: Home / Self Care | Payer: Medicare Other | Source: Ambulatory Visit | Attending: Neurology | Admitting: Neurology

## 2021-05-07 ENCOUNTER — Other Ambulatory Visit: Payer: Self-pay

## 2021-05-07 DIAGNOSIS — F03A0 Unspecified dementia, mild, without behavioral disturbance, psychotic disturbance, mood disturbance, and anxiety: Secondary | ICD-10-CM

## 2021-05-07 DIAGNOSIS — F0390 Unspecified dementia without behavioral disturbance: Secondary | ICD-10-CM

## 2021-05-12 ENCOUNTER — Telehealth: Payer: Self-pay

## 2021-05-12 ENCOUNTER — Telehealth: Payer: Self-pay | Admitting: Neurology

## 2021-05-12 NOTE — Telephone Encounter (Signed)
Spoke with pt daughter and informed herMRI brain did not show any evidence of tumor, stroke, or bleed. It showed age-related changes and some hardening of the small blood vessels in the brain seen in patients with blood pressure, cholesterol issues. Continue with control of these conditions. Brain MRI does not diagnose dementia, with her testing and exam, she does have mild dementia, and may benefit from starting medication to help slow down memory loss, we had discussed Memantine, let us know if theywould like to start. Pt daughter [name] is going to talk to her brother and sisters and then call back to let us know

## 2021-05-12 NOTE — Telephone Encounter (Signed)
Pt daughter is returning heathers call. [phone]

## 2021-05-12 NOTE — Telephone Encounter (Signed)
See result notes. 

## 2021-05-12 NOTE — Telephone Encounter (Signed)
-----   Message from Cameron Sprang, MD sent at 05/11/2021 11:48 AM EDT ----- Pls let daughter know the MRI brain did not show any evidence of tumor, stroke, or bleed. It showed age-related changes and some hardening of the small blood vessels in the brain seen in patients with blood pressure, cholesterol issues. Continue with control of these conditions. Brain MRI does not diagnose dementia, with her testing and exam, she does have mild dementia, and may benefit from starting medication to help slow down memory loss, we had discussed Memantine, let us know if they would like to start. Thanks

## 2021-05-26 ENCOUNTER — Telehealth: Payer: Self-pay | Admitting: Neurology

## 2021-05-26 NOTE — Telephone Encounter (Signed)
Pt daughter called back no answer, when she calls back we need to know how many days out of the month how many days out of the month she needs off for [facility] paperwork

## 2021-05-26 NOTE — Telephone Encounter (Signed)
She said she needs 4 days out of the month.

## 2021-05-26 NOTE — Telephone Encounter (Signed)
Pt daughter is returning heathers call regarding FMLA paperwork

## 2021-06-03 NOTE — Telephone Encounter (Signed)
Pt is following up on FMLA paper work. They told her if she does not get it soon, they will cancel her request. Any questions please call her [phone]

## 2021-06-03 NOTE — Telephone Encounter (Signed)
Done, thanks

## 2021-06-06 DIAGNOSIS — Z0279 Encounter for issue of other medical certificate: Secondary | ICD-10-CM

## 2021-06-06 NOTE — Progress Notes (Signed)
Cardiology Office Note:    Date:  06/08/2021   ID:  [name], DOB November 26, 1933, MRN [id number]  PCP:  Marda Stalker, PA-C  Cardiologist:  Donato Heinz, MD  Electrophysiologist:  None   Referring MD: Marda Stalker, PA-C   Chief Complaint  Patient presents with   Congestive Heart Failure     History of Present Illness:    [name] is a 87 y.o. female with a hx of chronic combined systolic and diastolic heart failure hypertension, COPD who presents for hospital follow-up.  She was admitted to [facility] from 2/23 through 12/27/2020.  Had presented with shortness of breath with significantly elevated BP, and found to be significantly volume overloaded.  Echocardiogram on 12/16/20 showed EF 25 to A999333, grade 1 diastolic dysfunction, normal RV function, severe left atrial dilatation, moderate MR. RHC/LHC on 12/17/2020 showed minimal nonobstructive CAD, RA 4, RV 22/3, PA 32/11/22, PW 16, LVEDP 24.  Discharged on hydralazine 10 mg 3 times daily, Imdur 30 mg daily, losartan 25 mg daily, Aldactone 12.5 mg daily.  She developed AKI due to IV diuresis, was not discharged on diuresis.  Cardiac MRI on 02/28/2021 showed moderate LV dilatation with moderate systolic dysfunction (EF A999333), normal RV size and systolic function (EF [id number]), RV insertion site LGE.  Zio patch x3 days on 01/26/2021 showed one 6 beat episode of NSVT, occasional PVCs (1.1%).  Since last clinic visit, she reports that she is doing okay.  She started smoking again, currently 4 to 5 cigarettes/day. Denies any chest pain, dyspnea, lightheadedness, syncope, lower extremity edema, or palpitations.  Reports pain in left leg.     Wt Readings from Last 3 Encounters:  06/08/21 118 lb 12.8 oz (53.9 kg)  04/19/21 114 lb (51.7 kg)  03/22/21 114 lb 3.2 oz (51.8 kg)     Past Medical History:  Diagnosis Date   Acute combined systolic and diastolic CHF, NYHA class 4 (HCC)    Anxiety    Arthritis    knees, hands    Cancer  (HCC)    SQUAMOuS CELL CARCINOMA OF SKIN- PERIANAL    COPD (chronic obstructive pulmonary disease) ([place])    smoker   Depression    Elbow fracture, right 2013   Hyperlipidemia    Hypertension    Osteoporosis    Pneumonia    hx of    S/P radiation therapy 04/08/2014-05/14/2014   Right perianal skin / 45 Gy in 25 fractions   Wears glasses     Past Surgical History:  Procedure Laterality Date   ABDOMINAL HYSTERECTOMY     TOTAL VAGINAL HYSTERECTOMY   APPENDECTOMY     CHOLECYSTECTOMY     ELBOW FRACTURE SURGERY  2013   rt   EVALUATION UNDER ANESTHESIA WITH HEMORRHOIDECTOMY N/A 05/29/2016   Procedure: EXAM UNDER ANESTHESIA WITH INTERNAL AND EXTERNAL HEMORRHOIDECTOMY;  Surgeon: Georganna Skeans, MD;  Location: [place];  Service: General;  Laterality: N/A;   EYE SURGERY     left eye cataract surgery    HEMORRHOID SURGERY N/A 02/06/2014   Procedure: EXCISION PERIANAL MASS;  Surgeon: Zenovia Jarred, MD;  Location: [place];  Service: General;  Laterality: N/A;   HERNIA REPAIR     left inguinal hernia repair    I & D EXTREMITY Left 03/05/2013   Procedure: Irrigation and Debridement Olecranon Bursa/Left Elbow;  Surgeon: Linna Hoff, MD;  Location: [place];  Service: Orthopedics;  Laterality: Left;   JOINT REPLACEMENT  right and left hip replacement    MASS EXCISION N/A 05/24/2015   Procedure: EXCISION ANAL MASS X 2;  Surgeon: Georganna Skeans, MD;  Location: [place];  Service: General;  Laterality: N/A;   OTHER SURGICAL HISTORY     arthroscopic right knee    OTHER SURGICAL HISTORY     right carpal tunnel   OTHER SURGICAL HISTORY     right elbow surgery    OTHER SURGICAL HISTORY     oophorectomy 1958   RECTAL BIOPSY N/A 05/29/2016   Procedure: ANAL BIOPSY;  Surgeon: Georganna Skeans, MD;  Location: [place];  Service: General;  Laterality: N/A;   RIGHT/LEFT HEART CATH AND CORONARY ANGIOGRAPHY N/A 12/17/2020   Procedure:  RIGHT/LEFT HEART CATH AND CORONARY ANGIOGRAPHY;  Surgeon: Martinique, Peter M, MD;  Location: [place] [facility];  Service: Cardiovascular;  Laterality: N/A;   TOTAL KNEE ARTHROPLASTY  12/13/2011   Procedure: TOTAL KNEE ARTHROPLASTY;  Surgeon: Tobi Bastos, MD;  Location: [facility];  Service: Orthopedics;  Laterality: Right;    Current Medications: Current Meds  Medication Sig   alendronate (FOSAMAX) 70 MG tablet Take 70 mg by mouth once a week.   ALPRAZolam (XANAX) 0.25 MG tablet Take 0.25 mg by mouth 2 (two) times daily as needed for anxiety. For anxiety   aspirin EC 81 MG EC tablet Take 1 tablet (81 mg total) by mouth at bedtime. Swallow whole.   hydrALAZINE (APRESOLINE) 10 MG tablet Take 1 tablet (10 mg total) by mouth every 8 (eight) hours.   isosorbide mononitrate (IMDUR) 30 MG 24 hr tablet Take 1 tablet (30 mg total) by mouth daily.   losartan (COZAAR) 100 MG tablet Take 1 tablet (100 mg total) by mouth daily.   lovastatin (MEVACOR) 40 MG tablet Take 40 mg by mouth daily.   metoprolol succinate (TOPROL-XL) 50 MG 24 hr tablet Take 1 tablet (50 mg total) by mouth daily. Take with or immediately following a meal.   Multiple Vitamins-Minerals (PRESERVISION AREDS 2) CAPS Take 1 capsule by mouth 2 (two) times daily.   spironolactone (ALDACTONE) 25 MG tablet Take 1 tablet (25 mg total) by mouth daily.   venlafaxine (EFFEXOR-XR) 75 MG 24 hr capsule Take 75 mg by mouth daily after breakfast.   vitamin B-12 100 MCG tablet Take 1 tablet (100 mcg total) by mouth daily.   [DISCONTINUED] Multiple Vitamins-Minerals (OCUVITE PRESERVISION PO) Take by mouth 2 (two) times daily.     Allergies:   Prolia [denosumab]   Social History   Socioeconomic History   Marital status: Widowed    Spouse name: Not on file   Number of children: Not on file   Years of education: Not on file   Highest education level: Not on file  Occupational History   Not on file  Tobacco Use   Smoking status: Every Day     Packs/day: 0.25    Years: 50.00    Pack years: 12.50    Types: Cigarettes    Passive exposure: Never   Smokeless tobacco: Never  Vaping Use   Vaping Use: Never used  Substance and Sexual Activity   Alcohol use: No   Drug use: No   Sexual activity: Not Currently  Other Topics Concern   Not on file  Social History Narrative   Right Handed    Lives in a one story home    Drinks Caffeine   Social Determinants of Health   Financial Resource Strain: Low Risk  Difficulty of Paying Living Expenses: Not very hard  Food Insecurity: No Food Insecurity   Worried About Running Out of Food in the Last Year: Never true   Ran Out of Food in the Last Year: Never true  Transportation Needs: No Transportation Needs   Lack of Transportation (Medical): No   Lack of Transportation (Non-Medical): No  Physical Activity: Inactive   Days of Exercise per Week: 0 days   Minutes of Exercise per Session: 0 min  Stress: Not on file  Social Connections: Not on file     Family History: The patient's family history includes Arthritis in her father; Cancer in her father; Cancer (age of onset: 46) in her child; Cancer (age of onset: 16) in her sister; Hypertension in her mother.  ROS:   Please see the history of present illness.    All other systems reviewed and are negative.  EKGs/Labs/Other Studies Reviewed:    The following studies were reviewed today:   EKG:   03/22/2021- EKG is not ordered today.    Recent Labs: 12/15/2020: B Natriuretic Peptide 3,235.0; TSH 3.422 12/26/2020: Hemoglobin 13.5; Platelets 192 12/27/2020: Magnesium 1.8 04/05/2021: BUN 11; Creatinine, Ser 0.66; Potassium 3.7; Sodium 144  Recent Lipid Panel No results found for: CHOL, TRIG, HDL, CHOLHDL, VLDL, LDLCALC, LDLDIRECT  Physical Exam:    VS:  BP (!) 150/98 (BP Location: Left Arm, Patient Position: Sitting, Cuff Size: Small)   Pulse 75   Ht 5' (1.524 m)   Wt 118 lb 12.8 oz (53.9 kg)   SpO2 96%   BMI 23.20 kg/m      Wt Readings from Last 3 Encounters:  06/08/21 118 lb 12.8 oz (53.9 kg)  04/19/21 114 lb (51.7 kg)  03/22/21 114 lb 3.2 oz (51.8 kg)     GEN: Well nourished, well developed in no acute distress HEENT: Normal NECK: No JVD; No carotid bruits LYMPHATICS: scattered crackles at left base CARDIAC: RRR, no murmurs, rubs, gallops RESPIRATORY:  Clear to auscultation without rales, wheezing or rhonchi  ABDOMEN: Soft, non-tender, non-distended MUSCULOSKELETAL:  No edema; No deformity  SKIN: Warm and dry NEUROLOGIC:  Alert and oriented x 3 PSYCHIATRIC:  Normal affect   ASSESSMENT:    1. Chronic combined systolic (congestive) and diastolic (congestive) heart failure ([place])   2. Essential hypertension   3. Pain in both lower extremities     PLAN:    Chronic combined systolic and diastolic heart failure: Echocardiogram on 12/16/20 showed EF 25 to A999333, grade 1 diastolic dysfunction, normal RV function, severe left atrial dilatation, moderate MR. RHC/LHC on 12/17/2020 showed minimal nonobstructive CAD, RA 4, RV 22/3, PA 32/11/22, PW 16, LVEDP 24.  Cardiac MRI on 02/28/2021 showed moderate LV dilatation with moderate systolic dysfunction (EF A999333), normal RV size and systolic function (EF [id number]), RV insertion site LGE. Appears euvolemic. -Continue losartan 100 mg daily -Continue Imdur 30 mg daily/hydralazine 10 mg 3 times daily -Continue spironolactone 25 mg daily.  Will check BMP.  If stable renal function/potassium, plan to increase spironolactone.  If elevated creatinine or potassium, would plan to titrate up hydralazine -Delene Loll and Wilder Glade are cost prohibitive -Continue Toprol-XL 50 mg daily.  This was not started during her hospitalization due to reported intermittent bradycardia.  Zio patch x3 days on 01/26/2021 showed one 6 beat episode of NSVT, occasional PVCs (1.1%), no issues with bradycardia, average heart rate 76 bpm. -Repeat echocardiogram in 3 months  Hypertension: Continue losartan,  Imdur/hydralazine, spironolactone, Toprol-XL as above.  BP elevated, plan  to increase spironolactone as above if stable renal function/potassium  Tobacco use: Counseled on the risk of tobacco use and cessation strongly recommended  Leg pain: Check ABIs  RTC in 3 months  Medication Adjustments/Labs and Tests Ordered: Current medicines are reviewed at length with the patient today.  Concerns regarding medicines are outlined above.  Orders Placed This Encounter  Procedures   Basic metabolic panel   Brain natriuretic peptide   ECHOCARDIOGRAM COMPLETE   VAS [place] ABI WITH/WO TBI   VAS [place] LOWER EXTREMITY ARTERIAL DUPLEX    No orders of the defined types were placed in this encounter.   Patient Instructions  Medication Instructions:  Your physician recommends that you continue on your current medications as directed. Please refer to the Current Medication list given to you today.  *If you need a refill on your cardiac medications before your next appointment, please call your pharmacy*   Lab Work: BMET, BNP today  If you have labs (blood work) drawn today and your tests are completely normal, you will receive your results only by: [place] (if you have MyChart) OR A paper copy in the mail If you have any lab test that is abnormal or we need to change your treatment, we will call you to review the results.   Testing/Procedures: Your physician has requested that you have an ankle brachial index (ABI). During this test an ultrasound and blood pressure cuff are used to evaluate the arteries that supply the arms and legs with blood. Allow thirty minutes for this exam. There are no restrictions or special instructions.  Your physician has requested that you have an echocardiogram (in 3 MONTHS). Echocardiography is a painless test that uses sound waves to create images of your heart. It provides your doctor with information about the size and shape of your heart and how well your  heart's chambers and valves are working. This procedure takes approximately one hour. There are no restrictions for this procedure.  Follow-Up: At [facility], you and your health needs are our priority.  As part of our continuing mission to provide you with exceptional heart care, we have created designated Provider Care Teams.  These Care Teams include your primary Cardiologist (physician) and Advanced Practice Providers (APPs -  Physician Assistants and Nurse Practitioners) who all work together to provide you with the care you need, when you need it.  We recommend signing up for the patient portal called "MyChart".  Sign up information is provided on this After Visit Summary.  MyChart is used to connect with patients for Virtual Visits (Telemedicine).  Patients are able to view lab/test results, encounter notes, upcoming appointments, etc.  Non-urgent messages can be sent to your provider as well.   To learn more about what you can do with MyChart, go to [email or web address].    Your next appointment:   2-4 weeks with pharmacist 3 months with Dr. Gardiner Rhyme (echo prior to appointment)     Signed, Donato Heinz, MD  06/08/2021 12:30 PM    [place]

## 2021-06-08 ENCOUNTER — Ambulatory Visit: Payer: Medicare Other | Admitting: Cardiology

## 2021-06-08 ENCOUNTER — Other Ambulatory Visit: Payer: Self-pay

## 2021-06-08 ENCOUNTER — Encounter: Payer: Self-pay | Admitting: Cardiology

## 2021-06-08 VITALS — BP 150/98 | HR 75 | Ht 60.0 in | Wt 118.8 lb

## 2021-06-08 DIAGNOSIS — I5042 Chronic combined systolic (congestive) and diastolic (congestive) heart failure: Secondary | ICD-10-CM | POA: Diagnosis not present

## 2021-06-08 DIAGNOSIS — M79605 Pain in left leg: Secondary | ICD-10-CM | POA: Diagnosis not present

## 2021-06-08 DIAGNOSIS — M79604 Pain in right leg: Secondary | ICD-10-CM | POA: Diagnosis not present

## 2021-06-08 DIAGNOSIS — I10 Essential (primary) hypertension: Secondary | ICD-10-CM

## 2021-06-08 NOTE — Patient Instructions (Signed)
Medication Instructions:  Your physician recommends that you continue on your current medications as directed. Please refer to the Current Medication list given to you today.  *If you need a refill on your cardiac medications before your next appointment, please call your pharmacy*   Lab Work: BMET, BNP today  If you have labs (blood work) drawn today and your tests are completely normal, you will receive your results only by: [place] (if you have MyChart) OR A paper copy in the mail If you have any lab test that is abnormal or we need to change your treatment, we will call you to review the results.   Testing/Procedures: Your physician has requested that you have an ankle brachial index (ABI). During this test an ultrasound and blood pressure cuff are used to evaluate the arteries that supply the arms and legs with blood. Allow thirty minutes for this exam. There are no restrictions or special instructions.  Your physician has requested that you have an echocardiogram (in 3 MONTHS). Echocardiography is a painless test that uses sound waves to create images of your heart. It provides your doctor with information about the size and shape of your heart and how well your heart's chambers and valves are working. This procedure takes approximately one hour. There are no restrictions for this procedure.  Follow-Up: At [facility], you and your health needs are our priority.  As part of our continuing mission to provide you with exceptional heart care, we have created designated Provider Care Teams.  These Care Teams include your primary Cardiologist (physician) and Advanced Practice Providers (APPs -  Physician Assistants and Nurse Practitioners) who all work together to provide you with the care you need, when you need it.  We recommend signing up for the patient portal called "MyChart".  Sign up information is provided on this After Visit Summary.  MyChart is used to connect with  patients for Virtual Visits (Telemedicine).  Patients are able to view lab/test results, encounter notes, upcoming appointments, etc.  Non-urgent messages can be sent to your provider as well.   To learn more about what you can do with MyChart, go to [email or web address].    Your next appointment:   2-4 weeks with pharmacist 3 months with Dr. Gardiner Rhyme (echo prior to appointment)

## 2021-06-09 LAB — BASIC METABOLIC PANEL
BUN/Creatinine Ratio: 14 (ref 12–28)
BUN: 11 mg/dL (ref 8–27)
CO2: 25 mmol/L (ref 20–29)
Calcium: 9.8 mg/dL (ref 8.7–10.3)
Chloride: 105 mmol/L (ref 96–106)
Creatinine, Ser: 0.79 mg/dL (ref 0.57–1.00)
Glucose: 92 mg/dL (ref 65–99)
Potassium: 3.9 mmol/L (ref 3.5–5.2)
Sodium: 145 mmol/L — ABNORMAL HIGH (ref 134–144)
eGFR: 73 mL/min/{1.73_m2} (ref >59)

## 2021-06-09 LAB — BRAIN NATRIURETIC PEPTIDE: BNP: 606.8 pg/mL — ABNORMAL HIGH (ref 0.0–100.0)

## 2021-06-10 ENCOUNTER — Other Ambulatory Visit: Payer: Self-pay | Admitting: *Deleted

## 2021-06-10 MED ORDER — HYDRALAZINE HCL 25 MG PO TABS: 25.0000 mg | ORAL_TABLET | Freq: Three times a day (TID) | ORAL | 3 refills | Status: DC

## 2021-06-16 ENCOUNTER — Other Ambulatory Visit: Payer: Self-pay | Admitting: Cardiology

## 2021-06-16 DIAGNOSIS — I739 Peripheral vascular disease, unspecified: Secondary | ICD-10-CM

## 2021-06-16 DIAGNOSIS — M79604 Pain in right leg: Secondary | ICD-10-CM

## 2021-06-16 DIAGNOSIS — M79605 Pain in left leg: Secondary | ICD-10-CM

## 2021-06-20 ENCOUNTER — Ambulatory Visit (HOSPITAL_COMMUNITY)
Admission: RE | Admit: 2021-06-20 | Discharge: 2021-06-20 | Disposition: A | Discharge status: Home / Self Care | Payer: Medicare Other | Source: Ambulatory Visit | Attending: Cardiology | Admitting: Cardiology

## 2021-06-20 ENCOUNTER — Other Ambulatory Visit: Payer: Self-pay

## 2021-06-20 DIAGNOSIS — I739 Peripheral vascular disease, unspecified: Secondary | ICD-10-CM | POA: Insufficient documentation

## 2021-06-20 DIAGNOSIS — M79604 Pain in right leg: Secondary | ICD-10-CM | POA: Diagnosis present

## 2021-06-20 DIAGNOSIS — M79605 Pain in left leg: Secondary | ICD-10-CM | POA: Insufficient documentation

## 2021-06-22 ENCOUNTER — Ambulatory Visit (INDEPENDENT_AMBULATORY_CARE_PROVIDER_SITE_OTHER): Payer: Medicare Other | Admitting: Pharmacist Clinician (PhC)/ Clinical Pharmacy Specialist

## 2021-06-22 ENCOUNTER — Encounter: Payer: Self-pay | Admitting: Pharmacist Clinician (PhC)/ Clinical Pharmacy Specialist

## 2021-06-22 ENCOUNTER — Other Ambulatory Visit: Payer: Self-pay

## 2021-06-22 VITALS — BP 134/86 | HR 86 | Resp 14 | Ht 59.0 in | Wt 116.2 lb

## 2021-06-22 DIAGNOSIS — I5041 Acute combined systolic (congestive) and diastolic (congestive) heart failure: Secondary | ICD-10-CM

## 2021-06-22 DIAGNOSIS — I5042 Chronic combined systolic (congestive) and diastolic (congestive) heart failure: Secondary | ICD-10-CM

## 2021-06-22 MED ORDER — SPIRONOLACTONE 25 MG PO TABS
37.5000 mg | ORAL_TABLET | Freq: Every day | ORAL | 3 refills | Status: DC
Start: 2021-06-22 — End: 2021-07-11

## 2021-06-22 NOTE — Progress Notes (Signed)
06/22/2021 ZER GABOR 09/25/1934 [id number]   HPI:  [name] is a 87 y.o. female patient of Dr Gardiner Rhyme, who presents today for heart failure medication titration.  He saw the patient about 2 weeks ago at which time her blood pressure was elevated at 150/98.  She was hospitalized in February with acute combined systolic and diastolic heart failure.  At that time EF was noted to be 25-30%, she was hyponatremic and hypokalemic.  After hospitalization she was started on losartan, isosorbide, hydralazine, spironolactone and metoprolol.  Losartan was increased over several visits to maximum dose.  She cannot get Entresto or an SGLT-2 inhibitor due to costs.    She is here today with her son.  Unfortunately they don't have a current list of her medications and neither knows exactly what she takes.  She also has BP checked daily by [facility], but doesn't know what those readings are or have any with her.  She reports feeling well, no problems with any of her current medications and no issues with compliance.     Blood Pressure Goal:  130/80  Current Medications: losartan 100 mg qd, metoprolol succ 50 mg qd, isosorbide mono 30 mg qd, hydralazine 25 mg tid  Social Hx: 4 cigarettes per day; no alcohol, coffee in the am (decaf), coke and tea during day  Diet: doesn't cook much, family brings food in to heat - sausage biscuits for breakfast most days; macaroni, spaghetti, soups, some chips with lunch  Exercise: no regular exercise, walks down driveway once or twice daily,   Home BP readings: [facility] comes in daily, checks BP, but no data with them today  Intolerances: Prolia  Labs: 8/22: Na 145, K 3.9, Glu 92, BUN 11, SCr 0.79, GFR 73   Wt Readings from Last 3 Encounters:  06/22/21 116 lb 3.2 oz (52.7 kg)  06/08/21 118 lb 12.8 oz (53.9 kg)  04/19/21 114 lb (51.7 kg)   BP Readings from Last 3 Encounters:  06/22/21 134/86  06/08/21 (!) 150/98  04/19/21 (!) 149/89   Pulse  Readings from Last 3 Encounters:  06/22/21 86  06/08/21 75  04/19/21 (!) 107    Current Outpatient Medications  Medication Sig Dispense Refill   spironolactone (ALDACTONE) 25 MG tablet Take 1.5 tablets (37.5 mg total) by mouth daily. 135 tablet 3   alendronate (FOSAMAX) 70 MG tablet Take 70 mg by mouth once a week.     ALPRAZolam (XANAX) 0.25 MG tablet Take 0.25 mg by mouth 2 (two) times daily as needed for anxiety. For anxiety     aspirin EC 81 MG EC tablet Take 1 tablet (81 mg total) by mouth at bedtime. Swallow whole. 30 tablet 11   docusate sodium (COLACE) 100 MG capsule Take 100 mg by mouth daily as needed.     guaiFENesin (MUCINEX) 600 MG 12 hr tablet Take 2 tablets (1,200 mg total) by mouth 2 (two) times daily. 30 tablet 0   hydrALAZINE (APRESOLINE) 25 MG tablet Take 1 tablet (25 mg total) by mouth 3 (three) times daily. Or every 8 hours 270 tablet 3   isosorbide mononitrate (IMDUR) 30 MG 24 hr tablet Take 1 tablet (30 mg total) by mouth daily. 30 tablet 11   losartan (COZAAR) 100 MG tablet Take 1 tablet (100 mg total) by mouth daily. 90 tablet 3   lovastatin (MEVACOR) 20 MG tablet Take 20 mg by mouth at bedtime.     lovastatin (MEVACOR) 40 MG tablet Take 40  mg by mouth daily.     metoprolol succinate (TOPROL-XL) 50 MG 24 hr tablet Take 1 tablet (50 mg total) by mouth daily. Take with or immediately following a meal. 90 tablet 3   Multiple Vitamins-Minerals (PRESERVISION AREDS 2) CAPS Take 1 capsule by mouth 2 (two) times daily.     venlafaxine (EFFEXOR-XR) 75 MG 24 hr capsule Take 75 mg by mouth daily after breakfast.     venlafaxine XR (EFFEXOR-XR) 150 MG 24 hr capsule Take 150 mg by mouth daily.     vitamin B-12 100 MCG tablet Take 1 tablet (100 mcg total) by mouth daily. 30 tablet 0   No current facility-administered medications for this visit.    Allergies  Allergen Reactions   Prolia [Denosumab] Other (See Comments)    Severe pain, could not fuction    Past Medical  History:  Diagnosis Date   Acute combined systolic and diastolic CHF, NYHA class 4 (HCC)    Anxiety    Arthritis    knees, hands    Cancer (HCC)    SQUAMOuS CELL CARCINOMA OF SKIN- PERIANAL    COPD (chronic obstructive pulmonary disease) ([place])    smoker   Depression    Elbow fracture, right 2013   Hyperlipidemia    Hypertension    Osteoporosis    Pneumonia    hx of    S/P radiation therapy 04/08/2014-05/14/2014   Right perianal skin / 45 Gy in 25 fractions   Wears glasses     Blood pressure 134/86, pulse 86, resp. rate 14, height '4\' 11"'$  (1.499 m), weight 116 lb 3.2 oz (52.7 kg), SpO2 92 %.  Acute combined systolic and diastolic CHF, NYHA class 4 ([place]) Patient with HFrEF, currently at 25-30%.  Tolerating current medications without problem.  Will increase spironolactone to 37.5 mg once daily to hopefully avoid any issues with hypotension.  She was advised to contact the office should hypotension occur. Otherwise she will need to repeat metabolic panel in 2 weeks.  [name] and [name] are both cost prohibitive to patient.     Tommy Medal PharmD CPP [facility] [address] [place], [place] [address] [phone]

## 2021-06-22 NOTE — Patient Instructions (Signed)
  Go to the lab in 2 weeks to check kidney function and electrolytes  Check your blood pressure at home daily and keep record of the readings.  If you notice that your blood pressure gets too low (< [id number] systolic or you feel weak/dizzy), please call the office - Rosanne Wohlfarth/Chris at [phone]  Take your BP meds as follows:  Increase spironolactone to 1.5 tablets (37.5 mg) once daily   Continue with all other medications  Bring all of your meds, your BP cuff and your record of home blood pressures to your next appointment.  Exercise as you're able, try to walk approximately 30 minutes per day.  Keep salt intake to a minimum, especially watch canned and prepared boxed foods.  Eat more fresh fruits and vegetables and fewer canned items.  Avoid eating in fast food restaurants.    HOW TO TAKE YOUR BLOOD PRESSURE: Rest 5 minutes before taking your blood pressure.  Don't smoke or drink caffeinated beverages for at least 30 minutes before. Take your blood pressure before (not after) you eat. Sit comfortably with your back supported and both feet on the floor (don't cross your legs). Elevate your arm to heart level on a table or a desk. Use the proper sized cuff. It should fit smoothly and snugly around your bare upper arm. There should be enough room to slip a fingertip under the cuff. The bottom edge of the cuff should be 1 inch above the crease of the elbow. Ideally, take 3 measurements at one sitting and record the average.

## 2021-06-22 NOTE — Assessment & Plan Note (Signed)
Patient with HFrEF, currently at 25-30%.  Tolerating current medications without problem.  Will increase spironolactone to 37.5 mg once daily to hopefully avoid any issues with hypotension.  She was advised to contact the office should hypotension occur. Otherwise she will need to repeat metabolic panel in 2 weeks.  Delene Loll and Wilder Glade are both cost prohibitive to patient.

## 2021-06-28 ENCOUNTER — Other Ambulatory Visit: Payer: Self-pay | Admitting: *Deleted

## 2021-06-28 DIAGNOSIS — I739 Peripheral vascular disease, unspecified: Secondary | ICD-10-CM

## 2021-06-30 ENCOUNTER — Other Ambulatory Visit (HOSPITAL_COMMUNITY): Payer: Medicare Other

## 2021-07-07 ENCOUNTER — Other Ambulatory Visit: Payer: Self-pay | Admitting: Family Medicine

## 2021-07-07 DIAGNOSIS — M810 Age-related osteoporosis without current pathological fracture: Secondary | ICD-10-CM

## 2021-07-09 LAB — BASIC METABOLIC PANEL
BUN/Creatinine Ratio: 14 (ref 12–28)
BUN: 10 mg/dL (ref 8–27)
CO2: 26 mmol/L (ref 20–29)
Calcium: 9.9 mg/dL (ref 8.7–10.3)
Chloride: 105 mmol/L (ref 96–106)
Creatinine, Ser: 0.69 mg/dL (ref 0.57–1.00)
Glucose: 140 mg/dL — ABNORMAL HIGH (ref 65–99)
Potassium: 3.8 mmol/L (ref 3.5–5.2)
Sodium: 147 mmol/L — ABNORMAL HIGH (ref 134–144)
eGFR: 85 mL/min/{1.73_m2} (ref >59)

## 2021-07-11 ENCOUNTER — Other Ambulatory Visit: Payer: Self-pay | Admitting: *Deleted

## 2021-07-11 MED ORDER — SPIRONOLACTONE 25 MG PO TABS: 25.0000 mg | ORAL_TABLET | Freq: Every day | ORAL | 3 refills | Status: DC

## 2021-07-14 ENCOUNTER — Telehealth: Payer: Self-pay | Admitting: Neurology

## 2021-07-14 NOTE — Telephone Encounter (Signed)
Patient daughter called and states that they needs a letter for the insurance to states that she can not stay by herself, she states that the letter needs to have in it that she is note mentally or physically able to stay by herself  They would need someone there daily with her from 7 am to 11 pm.   Please call patient daughter

## 2021-07-18 ENCOUNTER — Other Ambulatory Visit: Payer: Self-pay | Admitting: *Deleted

## 2021-07-18 LAB — SPECIMEN STATUS REPORT

## 2021-07-18 LAB — LIPID PANEL
Chol/HDL Ratio: 2.3 ratio (ref 0.0–4.4)
Cholesterol, Total: 119 mg/dL (ref 100–199)
HDL: 52 mg/dL (ref >39)
LDL Chol Calc (NIH): 45 mg/dL (ref 0–99)
Triglycerides: 124 mg/dL (ref 0–149)
VLDL Cholesterol Cal: 22 mg/dL (ref 5–40)

## 2021-07-19 ENCOUNTER — Encounter: Payer: Self-pay | Admitting: Neurology

## 2021-07-19 NOTE — Telephone Encounter (Signed)
Pt daughter called no answer left a message  that letter is ready

## 2021-07-19 NOTE — Telephone Encounter (Signed)
Letter done, thanks.

## 2021-08-30 ENCOUNTER — Ambulatory Visit (HOSPITAL_COMMUNITY): Payer: Medicare Other | Attending: Cardiology

## 2021-08-30 ENCOUNTER — Other Ambulatory Visit: Payer: Self-pay

## 2021-08-30 ENCOUNTER — Telehealth: Payer: Self-pay | Admitting: Cardiology

## 2021-08-30 DIAGNOSIS — I5042 Chronic combined systolic (congestive) and diastolic (congestive) heart failure: Secondary | ICD-10-CM

## 2021-08-30 LAB — ECHOCARDIOGRAM COMPLETE
Area-P 1/2: 2.77 cm2
P 1/2 time: 691 msec
S' Lateral: 3.9 cm

## 2021-08-30 MED ORDER — SPIRONOLACTONE 25 MG PO TABS: 25.0000 mg | ORAL_TABLET | Freq: Every day | ORAL | 3 refills | Status: DC

## 2021-08-30 MED ORDER — PERFLUTREN LIPID MICROSPHERE
1.0000 mL | INTRAVENOUS | Status: AC | PRN
  Administered 2021-08-30: 1 mL via INTRAVENOUS

## 2021-08-30 NOTE — Telephone Encounter (Signed)
 *  STAT* If patient is at the pharmacy, call can be transferred to refill team.   1. Which medications need to be refilled? (please list name of each medication and dose if known) spironolactone (ALDACTONE) 25 MG tablet  2. Which pharmacy/location (including street and city if local pharmacy) is medication to be sent to? [facility] - [place], [place] - [place] [address]  3. Do they need a 30 day or 90 day supply? 90 days   [facility] they need a new prescription send today so they can mail the meds to the pt today

## 2021-09-19 NOTE — Progress Notes (Signed)
Cardiology Office Note:    Date:  09/20/2021   ID:  [name], DOB April 14, 1934, MRN [id number]  PCP:  Marda Stalker, PA-C  Cardiologist:  Donato Heinz, MD  Electrophysiologist:  None   Referring MD: Marda Stalker, PA-C   Chief Complaint  Patient presents with   Congestive Heart Failure      History of Present Illness:    [name] is a 87 y.o. female with a hx of chronic combined systolic and diastolic heart failure hypertension, COPD who presents for hospital follow-up.  She was admitted to [facility] from 2/23 through 12/27/2020.  Had presented with shortness of breath with significantly elevated BP, and found to be significantly volume overloaded.  Echocardiogram on 12/16/20 showed EF 25 to 48%, grade 1 diastolic dysfunction, normal RV function, severe left atrial dilatation, moderate MR. RHC/LHC on 12/17/2020 showed minimal nonobstructive CAD, RA 4, RV 22/3, PA 32/11/22, PW 16, LVEDP 24.  Discharged on hydralazine 10 mg 3 times daily, Imdur 30 mg daily, losartan 25 mg daily, Aldactone 12.5 mg daily.  She developed AKI due to IV diuresis, was not discharged on diuresis.  Cardiac MRI on 02/28/2021 showed moderate LV dilatation with moderate systolic dysfunction (EF 54%), normal RV size and systolic function (EF 62%), RV insertion site LGE.  Zio patch x3 days on 01/26/2021 showed one 6 beat episode of NSVT, occasional PVCs (1.1%).  Echocardiogram 08/30/2021 showed EF 35 to 40%, global hypokinesis, grade 1 diastolic dysfunction, normal RV function, mild AI.  Since last clinic visit, she reports she has been doing okay.  Reports having pain in her left leg, particularly in her calf.  States that it can occur at rest or with exertion.  Denies any chest pain, dyspnea, lightheadedness, syncope, lower extremity edema, or palpitations.  Has been smoking 3 cigarettes/day.  Most exertion she does is walking down the hallway at her facility.   Wt Readings from Last 3 Encounters:   09/20/21 119 lb 12.8 oz (54.3 kg)  06/22/21 116 lb 3.2 oz (52.7 kg)  06/08/21 118 lb 12.8 oz (53.9 kg)     Past Medical History:  Diagnosis Date   Acute combined systolic and diastolic CHF, NYHA class 4 (HCC)    Anxiety    Arthritis    knees, hands    Cancer (HCC)    SQUAMOuS CELL CARCINOMA OF SKIN- PERIANAL    COPD (chronic obstructive pulmonary disease) ([place])    smoker   Depression    Elbow fracture, right 2013   Hyperlipidemia    Hypertension    Osteoporosis    Pneumonia    hx of    S/P radiation therapy 04/08/2014-05/14/2014   Right perianal skin / 45 Gy in 25 fractions   Wears glasses     Past Surgical History:  Procedure Laterality Date   ABDOMINAL HYSTERECTOMY     TOTAL VAGINAL HYSTERECTOMY   APPENDECTOMY     CHOLECYSTECTOMY     ELBOW FRACTURE SURGERY  2013   rt   EVALUATION UNDER ANESTHESIA WITH HEMORRHOIDECTOMY N/A 05/29/2016   Procedure: EXAM UNDER ANESTHESIA WITH INTERNAL AND EXTERNAL HEMORRHOIDECTOMY;  Surgeon: Georganna Skeans, MD;  Location: [place];  Service: General;  Laterality: N/A;   EYE SURGERY     left eye cataract surgery    HEMORRHOID SURGERY N/A 02/06/2014   Procedure: EXCISION PERIANAL MASS;  Surgeon: Zenovia Jarred, MD;  Location: [place];  Service: General;  Laterality: N/A;   HERNIA REPAIR  left inguinal hernia repair    I & D EXTREMITY Left 03/05/2013   Procedure: Irrigation and Debridement Olecranon Bursa/Left Elbow;  Surgeon: Linna Hoff, MD;  Location: [place];  Service: Orthopedics;  Laterality: Left;   JOINT REPLACEMENT     right and left hip replacement    MASS EXCISION N/A 05/24/2015   Procedure: EXCISION ANAL MASS X 2;  Surgeon: Georganna Skeans, MD;  Location: [place];  Service: General;  Laterality: N/A;   OTHER SURGICAL HISTORY     arthroscopic right knee    OTHER SURGICAL HISTORY     right carpal tunnel   OTHER SURGICAL HISTORY     right elbow surgery    OTHER SURGICAL  HISTORY     oophorectomy 1958   RECTAL BIOPSY N/A 05/29/2016   Procedure: ANAL BIOPSY;  Surgeon: Georganna Skeans, MD;  Location: [place];  Service: General;  Laterality: N/A;   RIGHT/LEFT HEART CATH AND CORONARY ANGIOGRAPHY N/A 12/17/2020   Procedure: RIGHT/LEFT HEART CATH AND CORONARY ANGIOGRAPHY;  Surgeon: Martinique, Peter M, MD;  Location: [place] [facility];  Service: Cardiovascular;  Laterality: N/A;   TOTAL KNEE ARTHROPLASTY  12/13/2011   Procedure: TOTAL KNEE ARTHROPLASTY;  Surgeon: Tobi Bastos, MD;  Location: [facility];  Service: Orthopedics;  Laterality: Right;    Current Medications: Current Meds  Medication Sig   alendronate (FOSAMAX) 70 MG tablet Take 70 mg by mouth once a week.   aspirin EC 81 MG EC tablet Take 1 tablet (81 mg total) by mouth at bedtime. Swallow whole.   docusate sodium (COLACE) 100 MG capsule Take 100 mg by mouth daily as needed.   hydrALAZINE (APRESOLINE) 25 MG tablet Take 1 tablet (25 mg total) by mouth 3 (three) times daily. Or every 8 hours   isosorbide mononitrate (IMDUR) 30 MG 24 hr tablet Take 1 tablet (30 mg total) by mouth daily.   lovastatin (MEVACOR) 40 MG tablet Take 1 tablet by mouth daily.   metoprolol succinate (TOPROL-XL) 50 MG 24 hr tablet Take 1 tablet (50 mg total) by mouth daily. Take with or immediately following a meal.   Multiple Vitamins-Minerals (PRESERVISION AREDS 2) CAPS Take 1 capsule by mouth 2 (two) times daily.   spironolactone (ALDACTONE) 25 MG tablet Take 1 tablet (25 mg total) by mouth daily.   venlafaxine XR (EFFEXOR-XR) 150 MG 24 hr capsule Take 150 mg by mouth daily.   vitamin B-12 100 MCG tablet Take 1 tablet (100 mcg total) by mouth daily.     Allergies:   Prolia [denosumab]   Social History   Socioeconomic History   Marital status: Widowed    Spouse name: Not on file   Number of children: Not on file   Years of education: Not on file   Highest education level: Not on file  Occupational History    Not on file  Tobacco Use   Smoking status: Every Day    Packs/day: 0.25    Years: 50.00    Pack years: 12.50    Types: Cigarettes    Passive exposure: Never   Smokeless tobacco: Never  Vaping Use   Vaping Use: Never used  Substance and Sexual Activity   Alcohol use: No   Drug use: No   Sexual activity: Not Currently  Other Topics Concern   Not on file  Social History Narrative   Right Handed    Lives in a one story home    Drinks Caffeine  Social Determinants of Health   Financial Resource Strain: Low Risk    Difficulty of Paying Living Expenses: Not very hard  Food Insecurity: No Food Insecurity   Worried About Charity fundraiser in the Last Year: Never true   Ran Out of Food in the Last Year: Never true  Transportation Needs: No Transportation Needs   Lack of Transportation (Medical): No   Lack of Transportation (Non-Medical): No  Physical Activity: Inactive   Days of Exercise per Week: 0 days   Minutes of Exercise per Session: 0 min  Stress: Not on file  Social Connections: Not on file     Family History: The patient's family history includes Arthritis in her father; Cancer in her father; Cancer (age of onset: 65) in her child; Cancer (age of onset: 45) in her sister; Hypertension in her mother.  ROS:   Please see the history of present illness.    All other systems reviewed and are negative.  EKGs/Labs/Other Studies Reviewed:    The following studies were reviewed today:   EKG:   09/20/21: Normal sinus rhythm, rate 68, right atrial enlargement, right axis deviation, Q waves in V1/2, T wave inversions in leads III, aVF  Recent Labs: 12/15/2020: TSH 3.422 12/26/2020: Hemoglobin 13.5; Platelets 192 12/27/2020: Magnesium 1.8 06/08/2021: BNP 606.8 07/08/2021: BUN 10; Creatinine, Ser 0.69; Potassium 3.8; Sodium 147  Recent Lipid Panel    Component Value Date/Time   CHOL 119 07/08/2021 1408   TRIG 124 07/08/2021 1408   HDL 52 07/08/2021 1408   CHOLHDL 2.3  07/08/2021 1408   LDLCALC 45 07/08/2021 1408    Physical Exam:    VS:  BP 120/70   Pulse 68   Ht 4\' 11"  (1.499 m)   Wt 119 lb 12.8 oz (54.3 kg)   SpO2 94%   BMI 24.20 kg/m     Wt Readings from Last 3 Encounters:  09/20/21 119 lb 12.8 oz (54.3 kg)  06/22/21 116 lb 3.2 oz (52.7 kg)  06/08/21 118 lb 12.8 oz (53.9 kg)     GEN: Well nourished, well developed in no acute distress HEENT: Normal NECK: No JVD; No carotid bruits LYMPHATICS: scattered crackles at left base CARDIAC: RRR, no murmurs, rubs, gallops RESPIRATORY:  Clear to auscultation without rales, wheezing or rhonchi  ABDOMEN: Soft, non-tender, non-distended MUSCULOSKELETAL:  No edema; No deformity  SKIN: Warm and dry NEUROLOGIC:  Alert and oriented x 3 PSYCHIATRIC:  Normal affect   ASSESSMENT:    1. Chronic combined systolic (congestive) and diastolic (congestive) heart failure ([place])   2. Essential hypertension   3. Left leg pain   4. PAD (peripheral artery disease) ([place])   5. Tobacco use      PLAN:    Chronic combined systolic and diastolic heart failure: Echocardiogram on 12/16/20 showed EF 25 to 46%, grade 1 diastolic dysfunction, normal RV function, severe left atrial dilatation, moderate MR. RHC/LHC on 12/17/2020 showed minimal nonobstructive CAD, RA 4, RV 22/3, PA 32/11/22, PW 16, LVEDP 24.  Cardiac MRI on 02/28/2021 showed moderate LV dilatation with moderate systolic dysfunction (EF 96%), normal RV size and systolic function (EF 29%), RV insertion site LGE. Echocardiogram 08/30/2021 showed EF 35 to 40%, global hypokinesis, grade 1 diastolic dysfunction, normal RV function, mild AI. -Continue losartan 100 mg daily -Continue Imdur 30 mg daily/hydralazine 25 mg 3 times daily -Continue spironolactone 25 mg daily.  Will check BMP.   Delene Loll and [place] are cost prohibitive -Continue Toprol-XL 50 mg daily.  This  was not started during her hospitalization due to reported intermittent bradycardia.  Zio patch x3 days  on 01/26/2021 showed one 6 beat episode of NSVT, occasional PVCs (1.1%), no issues with bradycardia, average heart rate 76 bpm.  Hypertension: Continue losartan, Imdur/hydralazine, spironolactone, Toprol-XL as above  Tobacco use: Counseled on the risk of tobacco use and cessation strongly recommended  PAD: ABIs any 2922 showed moderate disease (0.75) on left, normal (1.10) on right.  Ultrasound showed 75 to 99% stenosis in the left TPT, along with 30 to 49% stenosis in the R proximal CFA, 50 to 74% stenosis in the R mid SFA. -Continue aspirin 81 mg daily -Continue lovastatin 40 mg daily, LDL 45 on 07/08/2021 -Smoking cessation strongly recommended -Regular walking program recommended  Leg pain: Reports pain in left calf in clinic today.  LLE appears well-perfused, warm with palpable distal pulses.  Check lower extremity duplex to rule out DVT.  May be having muscle cramps, will check electrolytes  RTC in 3 months  Medication Adjustments/Labs and Tests Ordered: Current medicines are reviewed at length with the patient today.  Concerns regarding medicines are outlined above.  Orders Placed This Encounter  Procedures   Basic metabolic panel   Magnesium   EKG 12-Lead   VAS [place] LOWER EXTREMITY VENOUS (DVT)     No orders of the defined types were placed in this encounter.   Patient Instructions  Medication Instructions:  Your physician recommends that you continue on your current medications as directed. Please refer to the Current Medication list given to you today.  *If you need a refill on your cardiac medications before your next appointment, please call your pharmacy*   Lab Work: BMET, [place] today  If you have labs (blood work) drawn today and your tests are completely normal, you will receive your results only by: [place] (if you have MyChart) OR A paper copy in the mail If you have any lab test that is abnormal or we need to change your treatment, we will call you to  review the results.   Testing/Procedures: Your physician has requested that you have a (left) lower extremity venous duplex (r/o DVT). This test is an ultrasound of the veins in the legs. It looks at venous blood flow that carries blood from the heart to the legs.   Follow-Up: At [facility], you and your health needs are our priority.  As part of our continuing mission to provide you with exceptional heart care, we have created designated Provider Care Teams.  These Care Teams include your primary Cardiologist (physician) and Advanced Practice Providers (APPs -  Physician Assistants and Nurse Practitioners) who all work together to provide you with the care you need, when you need it.  We recommend signing up for the patient portal called "MyChart".  Sign up information is provided on this After Visit Summary.  MyChart is used to connect with patients for Virtual Visits (Telemedicine).  Patients are able to view lab/test results, encounter notes, upcoming appointments, etc.  Non-urgent messages can be sent to your provider as well.   To learn more about what you can do with MyChart, go to [email or web address].    Your next appointment:   3 months with NP or PA 6 months with Dr. Gardiner Rhyme     Signed, Donato Heinz, MD  09/20/2021 5:36 PM    [place]

## 2021-09-20 ENCOUNTER — Encounter: Payer: Self-pay | Admitting: Cardiology

## 2021-09-20 ENCOUNTER — Other Ambulatory Visit: Payer: Self-pay

## 2021-09-20 ENCOUNTER — Ambulatory Visit: Payer: Medicare Other | Admitting: Cardiology

## 2021-09-20 VITALS — BP 120/70 | HR 68 | Ht 59.0 in | Wt 119.8 lb

## 2021-09-20 DIAGNOSIS — I5042 Chronic combined systolic (congestive) and diastolic (congestive) heart failure: Secondary | ICD-10-CM

## 2021-09-20 DIAGNOSIS — I739 Peripheral vascular disease, unspecified: Secondary | ICD-10-CM

## 2021-09-20 DIAGNOSIS — I10 Essential (primary) hypertension: Secondary | ICD-10-CM | POA: Diagnosis not present

## 2021-09-20 DIAGNOSIS — M79605 Pain in left leg: Secondary | ICD-10-CM

## 2021-09-20 DIAGNOSIS — Z720 Tobacco use: Secondary | ICD-10-CM

## 2021-09-20 NOTE — Patient Instructions (Signed)
Medication Instructions:  Your physician recommends that you continue on your current medications as directed. Please refer to the Current Medication list given to you today.  *If you need a refill on your cardiac medications before your next appointment, please call your pharmacy*   Lab Work: BMET, [place] today  If you have labs (blood work) drawn today and your tests are completely normal, you will receive your results only by: [place] (if you have MyChart) OR A paper copy in the mail If you have any lab test that is abnormal or we need to change your treatment, we will call you to review the results.   Testing/Procedures: Your physician has requested that you have a (left) lower extremity venous duplex (r/o DVT). This test is an ultrasound of the veins in the legs. It looks at venous blood flow that carries blood from the heart to the legs.   Follow-Up: At [facility], you and your health needs are our priority.  As part of our continuing mission to provide you with exceptional heart care, we have created designated Provider Care Teams.  These Care Teams include your primary Cardiologist (physician) and Advanced Practice Providers (APPs -  Physician Assistants and Nurse Practitioners) who all work together to provide you with the care you need, when you need it.  We recommend signing up for the patient portal called "MyChart".  Sign up information is provided on this After Visit Summary.  MyChart is used to connect with patients for Virtual Visits (Telemedicine).  Patients are able to view lab/test results, encounter notes, upcoming appointments, etc.  Non-urgent messages can be sent to your provider as well.   To learn more about what you can do with MyChart, go to [email or web address].    Your next appointment:   3 months with NP or PA 6 months with Dr. Gardiner Rhyme

## 2021-09-21 LAB — BASIC METABOLIC PANEL
BUN/Creatinine Ratio: 19 (ref 12–28)
BUN: 14 mg/dL (ref 8–27)
CO2: 25 mmol/L (ref 20–29)
Calcium: 9.5 mg/dL (ref 8.7–10.3)
Chloride: 103 mmol/L (ref 96–106)
Creatinine, Ser: 0.75 mg/dL (ref 0.57–1.00)
Glucose: 100 mg/dL — ABNORMAL HIGH (ref 70–99)
Potassium: 4 mmol/L (ref 3.5–5.2)
Sodium: 142 mmol/L (ref 134–144)
eGFR: 78 mL/min/{1.73_m2} (ref >59)

## 2021-09-21 LAB — MAGNESIUM: Magnesium: 2.2 mg/dL (ref 1.6–2.3)

## 2021-09-27 ENCOUNTER — Ambulatory Visit (HOSPITAL_COMMUNITY)
Admission: RE | Admit: 2021-09-27 | Discharge: 2021-09-27 | Disposition: A | Discharge status: Home / Self Care | Payer: Medicare Other | Source: Ambulatory Visit | Attending: Internal Medicine | Admitting: Internal Medicine

## 2021-09-27 ENCOUNTER — Other Ambulatory Visit: Payer: Self-pay

## 2021-09-27 DIAGNOSIS — M79605 Pain in left leg: Secondary | ICD-10-CM

## 2021-09-29 ENCOUNTER — Telehealth: Payer: Self-pay | Admitting: Cardiology

## 2021-09-29 NOTE — Telephone Encounter (Signed)
Informed daughter to the test was negative for DVT. Daughter verbalized understanding

## 2021-09-29 NOTE — Telephone Encounter (Signed)
Pt returning call for results... please advise  

## 2021-10-18 NOTE — Progress Notes (Signed)
Assessment/Plan:   Mild dementia without behavioral disturbance likely due to Alzheimer's disease    Recommendations:  Discussed safety both in and out of the home.  Discussed the importance of regular daily schedule with inclusion of crossword puzzles to maintain brain function.  Continue to monitor mood by PCP Stay active at least 30 minutes at least 3 times a week.  Naps should be scheduled and should be no longer than 60 minutes and should not occur after 2 PM.  Mediterranean diet is recommended  Start memantine 5 mg nightly, take half tab nightly for 1 week, then increase to 5 mg nightly, side effects discussed Follow up in  6 months.   Case discussed with Dr. Delice Lesch who agrees with the plan    Subjective:    ARIAUNA FARABEE is a very pleasant 87 y.o. RH female   with a history of hypertension, hyperlipidemia, squamous cell CA s/p radiation tx, COPD, CHF, presenting for evaluation of memory loss that became more apparent after hospitalization for significant hypertension and CHF more than 10 months ago.  This patient is accompanied in the office by her daughter who supplements the history.  Previous records as well as any outside records available were reviewed prior to todays visit.  She was last seen at our office on 04/19/2021, at which time MoCA score was 16/30.  MRI of the brain on 05/07/2021 showed chronic ischemic microangiopathy and generalized volume loss without a clear lobar predilection.  The patient was supposed to have started memantine 5 mg daily, but she never took it, afraid of the side effects.    In today's visit, the patient reports that her memory is about the same.  However, since moving to [place] assisted living 1 month ago, she feels more energetic, since she is able to participate in many activities and socializing.  She likes having 3 meals a day there with her new friends, as well as "enjoying smoking cigarettes outside but no more than 4 or 5 a day ".She  continues to repeat herself, and asked the same questions.  She is slower to remember things.  Medications are now given by the facility.  She no longer drives or cooks.  Her daughter is in charge of the finances.  She sleeps well at night, denies REM behavior, vivid dreams, or sleepwalking.  She denies hallucinations or paranoia.  She needs assistance to dress and bathe, now done by the staff.  She denies any headaches, dizziness, dysarthria, dysphagia, neck or back pain, focal numbness, tingling or weakness, bowel or bladder dysfunction.  No anosmia, tremors or falls.  She always ambulates with a cane for balance.  She has good vision in the left eye, the right eye has been deviated nasally since age 52.   Initial Evaluation 04/19/21 This is an 87 year old right-handed woman with a history of hypertension, hyperlipidemia, squamous cell CA s/p radiation tx, COPD, CHF, presenting for evaluation of memory loss. She feels her memory "ain't too good." [name] was noticing slight changes but memory changes became much more noticeable after her hospitalization 4 months ago for CHF and significant hypertension. Since then, memory changes were "all the time, daily." She would repeat herself, asking the same questions repeatedly. She is slower to remember things. She would misplace her credit card and checkbook, saying she knows [name] took it. [name] lives next door and has been fixing her pillbox even before the hospitalization. She was previously taking her own medications, but since hospitalization  has aides staying from [place] to 11pm to remind her to take them. [name] took over finances after hospitalization and noticed she had double paid quite a few bills in the past 9 months. She stopped driving 3-5 years ago due to vision loss in the right eye. Around the time she was hospitalized, she forgot she was cooking food on the stove. She was previously independent with dressing and bathing, now aides are on standby and make  sure she takes a shower. She states she is able to prepare her own food using the microwave. No paranoia or hallucinations, but [name] feels she is a little more depressed than before. She states mood is "just flat," she gets frustrated sometimes. Sleep is much better, no wandering behaviors.    She denies any headaches, dizziness, diplopia, dysarthria, dysphagia, neck/back pain, focal numbness/tingling/weakness, bowel/bladder dysfunction. No anosmia, tremors, no falls. She always ambulates with a cane for balance. No family history of dementia, no history of significant head injuries or alcohol use.     Laboratory Data: Recent Labs       Lab Results  Component Value Date    TSH 3.422 12/15/2020      Recent Labs       Lab Results  Component Value Date    VITAMINB12       MRI brain wo contrast 05/07/21 chronic ischemic microangiopathy and generalized volume loss without a clear lobar predilection.   PREVIOUS MEDICATIONS:   CURRENT MEDICATIONS:  Outpatient Encounter Medications as of 10/19/2021  Medication Sig   alendronate (FOSAMAX) 70 MG tablet Take 70 mg by mouth once a week.   aspirin EC 81 MG EC tablet Take 1 tablet (81 mg total) by mouth at bedtime. Swallow whole.   docusate sodium (COLACE) 100 MG capsule Take 100 mg by mouth daily as needed.   hydrALAZINE (APRESOLINE) 25 MG tablet Take 1 tablet (25 mg total) by mouth 3 (three) times daily. Or every 8 hours   isosorbide mononitrate (IMDUR) 30 MG 24 hr tablet Take 1 tablet (30 mg total) by mouth daily.   losartan (COZAAR) 100 MG tablet Take 1 tablet (100 mg total) by mouth daily.   lovastatin (MEVACOR) 40 MG tablet Take 1 tablet by mouth daily.   memantine (NAMENDA) 5 MG tablet Take half tablet nightly  for 1 week then increase to  1 tablet every night   metoprolol succinate (TOPROL-XL) 50 MG 24 hr tablet Take 1 tablet (50 mg total) by mouth daily. Take with or immediately following a meal.   Multiple Vitamins-Minerals  (PRESERVISION AREDS 2) CAPS Take 1 capsule by mouth 2 (two) times daily.   spironolactone (ALDACTONE) 25 MG tablet Take 1 tablet (25 mg total) by mouth daily.   venlafaxine XR (EFFEXOR-XR) 150 MG 24 hr capsule Take 150 mg by mouth daily.   vitamin B-12 100 MCG tablet Take 1 tablet (100 mcg total) by mouth daily.   No facility-administered encounter medications on file as of 10/19/2021.     Objective:     PHYSICAL EXAMINATION:    VITALS:   Vitals:   10/19/21 0944  Resp: 20  Height: 4\' 11"  (1.499 m)    GEN:  The patient appears stated age and is in NAD. HEENT:  Normocephalic, atraumatic.   Neurological examination:  General: NAD, well-groomed, appears stated age. Orientation: The patient is alert. Oriented to person, place and not to date Cranial nerves: There is good facial symmetry. Right inner deviation of the eye.   The  speech is fluent and clear. No aphasia or dysarthria. Fund of knowledge is appropriate. Recent and remote memory are impaired. Attention and concentration are reduced.  Able to name objects and repeat phrases.  Hearing is intact to conversational tone.    Sensation: Sensation is intact to light touch throughout Motor: Strength is at least antigravity x4. Tremors: none  DTR's 2/4 in [place] Cognitive Assessment  04/19/2021  Visuospatial/ Executive (0/5) 1  Naming (0/3) 3  Attention: Read list of digits (0/2) 2  Attention: Read list of letters (0/1) 0  Attention: Serial 7 subtraction starting at 100 (0/3) 2  Language: Repeat phrase (0/2) 2  Language : Fluency (0/1) 0  Abstraction (0/2) 0  Delayed Recall (0/5) 0  Orientation (0/6) 5  Total 15  Adjusted Score (based on education) 16   No flowsheet data found.  No flowsheet data found.     Movement examination: Tone: There is normal tone in the UE/LE Abnormal movements:  no tremor.  No myoclonus.  No asterixis.   Coordination:  There is no decremation with RAM's. Normal finger to nose   Gait and Station: The patient has some difficulty arising out of a deep-seated chair without the use of the hands, needs a cane on the right side to ambulate. The patient's stride length is good.  Gait is cautious and narrow.  Unable to do Romberg.       Total time spent on today's visit was 30 minutes, including both face-to-face time and nonface-to-face time. Time included that spent on review of records (prior notes available to me/labs/imaging if pertinent), discussing treatment and goals, answering patient's questions and coordinating care.  Cc:  Marda Stalker, PA-C Sharene Butters, PA-C

## 2021-10-19 ENCOUNTER — Other Ambulatory Visit: Payer: Self-pay

## 2021-10-19 ENCOUNTER — Ambulatory Visit: Payer: Medicare Other | Admitting: Physician Assistant

## 2021-10-19 VITALS — Resp 20 | Ht 59.0 in

## 2021-10-19 DIAGNOSIS — F02A0 Dementia in other diseases classified elsewhere, mild, without behavioral disturbance, psychotic disturbance, mood disturbance, and anxiety: Secondary | ICD-10-CM | POA: Diagnosis not present

## 2021-10-19 DIAGNOSIS — G309 Alzheimer's disease, unspecified: Secondary | ICD-10-CM

## 2021-10-19 MED ORDER — MEMANTINE HCL 5 MG PO TABS: ORAL_TABLET | ORAL | 11 refills | Status: DC

## 2021-10-19 NOTE — Patient Instructions (Signed)
Follow up in 6 months   Start Memantine 5  mg : take half pill nightly  for 1 week and then increase to 1 pill nightly      FALL PRECAUTIONS: Be cautious when walking. Scan the area for obstacles that may increase the risk of trips and falls. When getting up in the mornings, sit up at the edge of the bed for a few minutes before getting out of bed. Consider elevating the bed at the head end to avoid drop of blood pressure when getting up. Walk always in a well-lit room (use night lights in the walls). Avoid area rugs or power cords from appliances in the middle of the walkways. Use a walker or a cane if necessary and consider physical therapy for balance exercise. Get your eyesight checked regularly.  HOME SAFETY: Consider the safety of the kitchen when operating appliances like stoves, microwave oven, and blender. Consider having supervision and share cooking responsibilities until no longer able to participate in those. Accidents with firearms and other hazards in the house should be identified and addressed as well.   MEDICATION SUPERVISION: Inability to self-administer medication needs to be constantly addressed. Implement a mechanism to ensure safe administration of the medications.  RECOMMENDATIONS FOR ALL PATIENTS WITH MEMORY PROBLEMS: 1. Continue to exercise (Recommend 30 minutes of walking everyday, or 3 hours every week) 2. Increase social interactions - continue going to [place] and enjoy social gatherings with friends and family 3. Eat healthy, avoid fried foods and eat more fruits and vegetables 4. Maintain adequate blood pressure, blood sugar, and blood cholesterol level. Reducing the risk of stroke and cardiovascular disease also helps promoting better memory. 5. Avoid stressful situations. Live a simple life and avoid aggravations. Organize your time and prepare for the next day in anticipation. 6. Sleep well, avoid any interruptions of sleep and avoid any distractions in the  bedroom that may interfere with adequate sleep quality 7. Avoid sugar, avoid sweets as there is a strong link between excessive sugar intake, diabetes, and cognitive impairment We discussed the Mediterranean diet, which has been shown to help patients reduce the risk of progressive memory disorders and reduces cardiovascular risk. This includes eating fish, eat fruits and green leafy vegetables, nuts like almonds and hazelnuts, walnuts, and also use olive oil. Avoid fast foods and fried foods as much as possible. Avoid sweets and sugar as sugar use has been linked to worsening of memory function.  There is always a concern of gradual progression of memory problems. If this is the case, then we may need to adjust level of care according to patient needs. Support, both to the patient and caregiver, should then be put into place.

## 2021-11-03 ENCOUNTER — Other Ambulatory Visit: Payer: Self-pay

## 2021-11-03 ENCOUNTER — Encounter: Payer: Self-pay | Admitting: Cardiology

## 2021-11-03 ENCOUNTER — Telehealth: Payer: Self-pay | Admitting: Cardiology

## 2021-11-03 MED ORDER — LOSARTAN POTASSIUM 100 MG PO TABS: 100.0000 mg | ORAL_TABLET | Freq: Every day | ORAL | 3 refills | Status: AC

## 2021-11-03 NOTE — Telephone Encounter (Signed)
°*  STAT* If patient is at the pharmacy, call can be transferred to refill team.   1. Which medications need to be refilled? (please list name of each medication and dose if known) losartan (COZAAR) 100 MG tablet (Expired)  2. Which pharmacy/location (including street and city if local pharmacy) is medication to be sent to? [place], [place] [address]  3. Do they need a 30 day or 90 day supply? [place]

## 2021-11-03 NOTE — Telephone Encounter (Signed)
error 

## 2021-12-13 ENCOUNTER — Ambulatory Visit: Payer: Medicare Other | Admitting: Physician Assistant

## 2021-12-13 ENCOUNTER — Other Ambulatory Visit: Payer: Self-pay

## 2021-12-13 ENCOUNTER — Encounter: Payer: Self-pay | Admitting: Physician Assistant

## 2021-12-13 VITALS — BP 144/86 | HR 82 | Ht 59.0 in | Wt 123.6 lb

## 2021-12-13 DIAGNOSIS — I10 Essential (primary) hypertension: Secondary | ICD-10-CM

## 2021-12-13 DIAGNOSIS — E785 Hyperlipidemia, unspecified: Secondary | ICD-10-CM | POA: Diagnosis not present

## 2021-12-13 DIAGNOSIS — I5042 Chronic combined systolic (congestive) and diastolic (congestive) heart failure: Secondary | ICD-10-CM

## 2021-12-13 NOTE — Progress Notes (Signed)
Cardiology Office Note:    Date:  12/14/2021   ID:  [name], DOB Dec 30, 1933, MRN [id number]  PCP:  Marda Stalker, [facility] Cardiologist:  Donato Heinz, MD     Referring MD: Marda Stalker, PA-C   Chief Complaint  Patient presents with   Follow-up    Seen for Dr. Gardiner Rhyme    History of Present Illness:    [name] is a 88 y.o. female with a hx of chronic combined systolic and diastolic heart failure, COPD, hypertension, hyperlipidemia, and history of squamous cell carcinoma of the skin.  Patient was admitted to the hospital in February 2022 with shortness of breath and elevated blood pressure.  She was found to be significantly volume overloaded.  Echocardiogram obtained on 12/16/2020 showed EF 25 to 30%, grade 1 DD, normal RV function, severe LAE, moderate MR.  The left and right heart cath performed on 12/17/2020 showed minimal nonobstructive CAD, RV 22/3, PA pressure 32 over 11/22, LVEDP 24.  She was discharged on 10 mg 3 times a day of hydralazine, Imdur, losartan and Aldactone.  She had AKI due to IV diuresis therefore she was not discharged on diuretic.  Cardiac MRI in May 2022 showed moderate LV dilatation with moderate systolic dysfunction, EF 94%, normal RV size and the systolic function of 50%, RV insertion site LGE.  3-day Zio patch in April 2022 showed 6 beat episode of nonsustained VT, occasional PVCs 1.1%.  Echocardiogram obtained on 08/30/2021 showed EF 35 to 40%, grade 1 DD, global hypokinesis, normal RV function, mild AI.  Patient was last seen in November 2022 by Dr. Nechama Guard at which time she was doing well.  She did report pain in her legs particularly late in her calf.  She was also smoking 3 cigarettes/day as well.  No DVT was seen on the subsequent venous Doppler.  Patient presents today for follow-up accompanied by her son.  She denies any recent chest pain worsening dyspnea.  She appears to be euvolemic on physical exam.   She has no orthopnea or PND.  She continues to have left lower extremity calf pain, however recent venous Doppler was negative for DVT.  Overall, she is doing quite well on the current therapy and may follow-up with Dr. Gardiner Rhyme in 3 months.  If she continues to do well in the future, we can space out her visit to every 6 months.  We emphasized on salt restriction and fluid restriction.  Her recommended fluid intake should be between 32 to 48 ounces per day.  Past Medical History:  Diagnosis Date   Acute combined systolic and diastolic CHF, NYHA class 4 (HCC)    Anxiety    Arthritis    knees, hands    Cancer (HCC)    SQUAMOuS CELL CARCINOMA OF SKIN- PERIANAL    COPD (chronic obstructive pulmonary disease) (HCC)    smoker   Depression    Elbow fracture, right 2013   Hyperlipidemia    Hypertension    Osteoporosis    Pneumonia    hx of    S/P radiation therapy 04/08/2014-05/14/2014   Right perianal skin / 45 Gy in 25 fractions   Wears glasses     Past Surgical History:  Procedure Laterality Date   ABDOMINAL HYSTERECTOMY     TOTAL VAGINAL HYSTERECTOMY   APPENDECTOMY     CHOLECYSTECTOMY     ELBOW FRACTURE SURGERY  2013   rt   EVALUATION UNDER ANESTHESIA WITH HEMORRHOIDECTOMY  N/A 05/29/2016   Procedure: EXAM UNDER ANESTHESIA WITH INTERNAL AND EXTERNAL HEMORRHOIDECTOMY;  Surgeon: Georganna Skeans, MD;  Location: [place] [facility];  Service: General;  Laterality: N/A;   EYE SURGERY     left eye cataract surgery    HEMORRHOID SURGERY N/A 02/06/2014   Procedure: EXCISION PERIANAL MASS;  Surgeon: Zenovia Jarred, MD;  Location: [place];  Service: General;  Laterality: N/A;   HERNIA REPAIR     left inguinal hernia repair    I & D EXTREMITY Left 03/05/2013   Procedure: Irrigation and Debridement Olecranon Bursa/Left Elbow;  Surgeon: Linna Hoff, MD;  Location: [place];  Service: Orthopedics;  Laterality: Left;   JOINT REPLACEMENT     right and left hip replacement     MASS EXCISION N/A 05/24/2015   Procedure: EXCISION ANAL MASS X 2;  Surgeon: Georganna Skeans, MD;  Location: [place];  Service: General;  Laterality: N/A;   OTHER SURGICAL HISTORY     arthroscopic right knee    OTHER SURGICAL HISTORY     right carpal tunnel   OTHER SURGICAL HISTORY     right elbow surgery    OTHER SURGICAL HISTORY     oophorectomy 1958   RECTAL BIOPSY N/A 05/29/2016   Procedure: ANAL BIOPSY;  Surgeon: Georganna Skeans, MD;  Location: [place];  Service: General;  Laterality: N/A;   RIGHT/LEFT HEART CATH AND CORONARY ANGIOGRAPHY N/A 12/17/2020   Procedure: RIGHT/LEFT HEART CATH AND CORONARY ANGIOGRAPHY;  Surgeon: Martinique, Peter M, MD;  Location: [facility];  Service: Cardiovascular;  Laterality: N/A;   TOTAL KNEE ARTHROPLASTY  12/13/2011   Procedure: TOTAL KNEE ARTHROPLASTY;  Surgeon: Tobi Bastos, MD;  Location: [facility];  Service: Orthopedics;  Laterality: Right;    Current Medications: Current Meds  Medication Sig   alendronate (FOSAMAX) 70 MG tablet Take 70 mg by mouth once a week.   aspirin EC 81 MG EC tablet Take 1 tablet (81 mg total) by mouth at bedtime. Swallow whole.   docusate sodium (COLACE) 100 MG capsule Take 100 mg by mouth daily as needed.   hydrALAZINE (APRESOLINE) 25 MG tablet Take 1 tablet (25 mg total) by mouth 3 (three) times daily. Or every 8 hours   isosorbide mononitrate (IMDUR) 30 MG 24 hr tablet Take 1 tablet (30 mg total) by mouth daily.   losartan (COZAAR) 100 MG tablet Take 1 tablet (100 mg total) by mouth daily.   lovastatin (MEVACOR) 40 MG tablet Take 1 tablet by mouth daily.   memantine (NAMENDA) 5 MG tablet Take half tablet nightly  for 1 week then increase to  1 tablet every night   metoprolol succinate (TOPROL-XL) 50 MG 24 hr tablet Take 1 tablet (50 mg total) by mouth daily. Take with or immediately following a meal.   Multiple Vitamins-Minerals (PRESERVISION AREDS 2) CAPS Take 1 capsule by mouth 2  (two) times daily.   spironolactone (ALDACTONE) 25 MG tablet Take 1 tablet (25 mg total) by mouth daily.   venlafaxine XR (EFFEXOR-XR) 150 MG 24 hr capsule Take 150 mg by mouth daily.   vitamin B-12 100 MCG tablet Take 1 tablet (100 mcg total) by mouth daily.     Allergies:   Other and Prolia [denosumab]   Social History   Socioeconomic History   Marital status: Widowed    Spouse name: Not on file   Number of children: Not on file   Years of education:  Not on file   Highest education level: Not on file  Occupational History   Not on file  Tobacco Use   Smoking status: Every Day    Packs/day: 0.25    Years: 50.00    Pack years: 12.50    Types: Cigarettes    Passive exposure: Never   Smokeless tobacco: Never  Vaping Use   Vaping Use: Never used  Substance and Sexual Activity   Alcohol use: No   Drug use: No   Sexual activity: Not Currently  Other Topics Concern   Not on file  Social History Narrative   Right Handed    Lives in a one story home    Drinks Caffeine   Social Determinants of Health   Financial Resource Strain: Low Risk    Difficulty of Paying Living Expenses: Not very hard  Food Insecurity: No Food Insecurity   Worried About Charity fundraiser in the Last Year: Never true   Ran Out of Food in the Last Year: Never true  Transportation Needs: No Transportation Needs   Lack of Transportation (Medical): No   Lack of Transportation (Non-Medical): No  Physical Activity: Inactive   Days of Exercise per Week: 0 days   Minutes of Exercise per Session: 0 min  Stress: Not on file  Social Connections: Not on file     Family History: The patient's family history includes Arthritis in her father; Cancer in her father; Cancer (age of onset: 72) in her child; Cancer (age of onset: 56) in her sister; Hypertension in her mother.  ROS:   Please see the history of present illness.     All other systems reviewed and are negative.  EKGs/Labs/Other Studies Reviewed:     The following studies were reviewed today:  Echo 08/30/2021  1. Left ventricular ejection fraction, by estimation, is 35 to 40%. The  left ventricle has moderately decreased function. The left ventricle  demonstrates global hypokinesis. There is moderate left ventricular  hypertrophy. Left ventricular diastolic  parameters are consistent with Grade I diastolic dysfunction (impaired  relaxation).   2. Right ventricular systolic function is normal. The right ventricular  size is normal.   3. The mitral valve is normal in structure. Trivial mitral valve  regurgitation. No evidence of mitral stenosis.   4. The aortic valve is tricuspid. Aortic valve regurgitation is mild.  Mild to moderate aortic valve sclerosis/calcification is present, without  any evidence of aortic stenosis.   5. Aortic dilatation noted. There is mild dilatation of the ascending  aorta, measuring 41 mm.   6. The inferior vena cava is normal in size with greater than 50%  respiratory variability, suggesting right atrial pressure of 3 mmHg.   Comparison(s): 12/16/20 EF 25-30%. PA pressure 69mmHg.   EKG:  EKG is not ordered today.   Recent Labs: 12/15/2020: TSH 3.422 12/26/2020: Hemoglobin 13.5; Platelets 192 06/08/2021: BNP 606.8 09/20/2021: BUN 14; Creatinine, Ser 0.75; Magnesium 2.2; Potassium 4.0; Sodium 142  Recent Lipid Panel    Component Value Date/Time   CHOL 119 07/08/2021 1408   TRIG 124 07/08/2021 1408   HDL 52 07/08/2021 1408   CHOLHDL 2.3 07/08/2021 1408   LDLCALC 45 07/08/2021 1408     Risk Assessment/Calculations:           Physical Exam:    VS:  BP (!) 144/86 (BP Location: Left Arm, Patient Position: Sitting, Cuff Size: Normal)    Pulse 82    Ht 4\' 11"  (1.499 m)  Wt 123 lb 9.6 oz (56.1 kg)    SpO2 96%    BMI 24.96 kg/m     Wt Readings from Last 3 Encounters:  12/13/21 123 lb 9.6 oz (56.1 kg)  09/20/21 119 lb 12.8 oz (54.3 kg)  06/22/21 116 lb 3.2 oz (52.7 kg)     GEN:  Well  nourished, well developed in no acute distress HEENT: Normal NECK: No JVD; No carotid bruits LYMPHATICS: No lymphadenopathy CARDIAC: RRR, no murmurs, rubs, gallops RESPIRATORY:  Clear to auscultation without rales, wheezing or rhonchi  ABDOMEN: Soft, non-tender, non-distended MUSCULOSKELETAL:  No edema; No deformity  SKIN: Warm and dry NEUROLOGIC:  Alert and oriented x 3 PSYCHIATRIC:  Normal affect   ASSESSMENT:    1. Chronic combined systolic (congestive) and diastolic (congestive) heart failure ([place])   2. Essential hypertension   3. Hyperlipidemia LDL goal <100    PLAN:    In order of problems listed above:  Chronic combined systolic and diastolic heart failure: Previous cardiac catheterization showed minimal CAD.  Cardiomyopathy is nonischemic in nature.  Continue hydralazine, Imdur, spironolactone losartan and metoprolol.  Patient is euvolemic on physical exam  Hypertension: Blood pressure stable  Hyperlipidemia: On lovastatin           Medication Adjustments/Labs and Tests Ordered: Current medicines are reviewed at length with the patient today.  Concerns regarding medicines are outlined above.  No orders of the defined types were placed in this encounter.  No orders of the defined types were placed in this encounter.   Patient Instructions  Medication Instructions:  Your physician recommends that you continue on your current medications as directed. Please refer to the Current Medication list given to you today.   *If you need a refill on your cardiac medications before your next appointment, please call your pharmacy*   Lab Work: NONE ordered at this time of appointment   Testing/Procedures: NONE ordered at this time of appointment   Follow-Up: At [facility], you and your health needs are our priority.  As part of our continuing mission to provide you with exceptional heart care, we have created designated Provider Care Teams.  These Care Teams include  your primary Cardiologist (physician) and Advanced Practice Providers (APPs -  Physician Assistants and Nurse Practitioners) who all work together to provide you with the care you need, when you need it.  We recommend signing up for the patient portal called "MyChart".  Sign up information is provided on this After Visit Summary.  MyChart is used to connect with patients for Virtual Visits (Telemedicine).  Patients are able to view lab/test results, encounter notes, upcoming appointments, etc.  Non-urgent messages can be sent to your provider as well.   To learn more about what you can do with MyChart, go to [email or web address].    Your next appointment:   3 month(s)  The format for your next appointment:   In Person  Provider:   Donato Heinz, MD     Other Instructions     Signed, Almyra Deforest, [place]  12/14/2021 10:44 PM    [place]

## 2021-12-13 NOTE — Patient Instructions (Signed)
Medication Instructions:  Your physician recommends that you continue on your current medications as directed. Please refer to the Current Medication list given to you today.   *If you need a refill on your cardiac medications before your next appointment, please call your pharmacy*   Lab Work: NONE ordered at this time of appointment   Testing/Procedures: NONE ordered at this time of appointment   Follow-Up: At [facility], you and your health needs are our priority.  As part of our continuing mission to provide you with exceptional heart care, we have created designated Provider Care Teams.  These Care Teams include your primary Cardiologist (physician) and Advanced Practice Providers (APPs -  Physician Assistants and Nurse Practitioners) who all work together to provide you with the care you need, when you need it.  We recommend signing up for the patient portal called "MyChart".  Sign up information is provided on this After Visit Summary.  MyChart is used to connect with patients for Virtual Visits (Telemedicine).  Patients are able to view lab/test results, encounter notes, upcoming appointments, etc.  Non-urgent messages can be sent to your provider as well.   To learn more about what you can do with MyChart, go to [email or web address].    Your next appointment:   3 month(s)  The format for your next appointment:   In Person  Provider:   Donato Heinz, MD     Other Instructions

## 2021-12-14 ENCOUNTER — Encounter: Payer: Self-pay | Admitting: Physician Assistant

## 2022-04-19 ENCOUNTER — Encounter: Payer: Self-pay | Admitting: Physician Assistant

## 2022-04-19 ENCOUNTER — Ambulatory Visit: Payer: Medicare Other | Admitting: Physician Assistant

## 2022-04-19 DIAGNOSIS — Z029 Encounter for administrative examinations, unspecified: Secondary | ICD-10-CM

## 2022-04-20 ENCOUNTER — Ambulatory Visit: Payer: Medicare Other | Admitting: Neurology

## 2022-04-20 NOTE — Progress Notes (Signed)
Assessment/Plan:   Dementia due to Alzheimer's Disease   [name] is a very pleasant 88 y.o. RH female   with a history of hypertension, hyperlipidemia, squamous cell CA s/p radiation tx, COPD, CHF, presenting for evaluation of memory loss.  MRI of the brain on 05/07/2021 showed chronic ischemic microangiopathy and generalized volume loss. MMSE today is 23/30, stable from prior. She is on memantine 5 mg daily, tolerating well.    Recommendations:    Continue memantine 5 mg daily. Side effects were discussed Follow up in 6 months.   Case discussed with Dr. Karel Jarvis who agrees with the plan     Subjective:    This patient is accompanied in the office by her  son who supplements the history.  Previous records as well as any outside records available were reviewed prior to todays visit.  Patient was last seen at our office on 10/19/21. MoCA on 03/2021 was 16/22   Any changes in memory since last visit? She reports that her memory is about the same. "She needs newspapers to know what date is". She enjoys activities that [place] offers, such as jigsaw puzzles. Patient lives with: [facility] " she enjoys it " repeats oneself? Denies  Disoriented when walking into a room?  Patient denies   Leaving objects in unusual places?  Patient denies   Ambulates  with difficulty?   Patient denies . Uses a cane on the Right Recent falls?  Patient denies   Any head injuries?  Patient denies   History of seizures?   Patient denies   Wandering behavior?  Patient denies   Patient drives?   Patient no longer drives   Any mood changes such irritability agitation?  Patient denies   Any history of depression?:  Patient denies   Hallucinations?  Patient denies   Paranoia?  Patient denies   Patient reports that he sleeps well without vivid dreams, REM behavior or sleepwalking    History of sleep apnea?  Patient denies   Any hygiene concerns?  Patient denies   Independent of bathing  and dressing?  Endorsed  Does the patient needs help with medications? [facility] manages the meds  Who is in charge of the finances?  Son  is in charge    Any changes in appetite?  Patient denies   Patient have trouble swallowing? Patient denies   Does the patient cook?  Patient denies   Any kitchen accidents such as leaving the stove on? Patient denies   Any headaches?  Patient denies   The double vision? 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  Patient denies   Chronic back pain Patient denies   Unilateral weakness?  Patient denies   Any tremors?  Patient denies   Any history of anosmia?  Patient denies   Any incontinence of urine?  Patient denies   Any bowel dysfunction?   Patient denies       Initial Evaluation 04/19/21 This is an 88 year old right-handed woman with a history of hypertension, hyperlipidemia, squamous cell CA s/p radiation tx, COPD, CHF, presenting for evaluation of memory loss. She feels her memory "ain't too good." [name] was noticing slight changes but memory changes became much more noticeable after her hospitalization 4 months ago for CHF and significant hypertension. Since then, memory changes were "all the time, daily." She would repeat herself, asking the same questions repeatedly. She is slower to  remember things. She would misplace her credit card and checkbook, saying she knows Alesia Banda took it. [name] lives next door and has been fixing her pillbox even before the hospitalization. She was previously taking her own medications, but since hospitalization has aides staying from 7am to 11pm to remind her to take them. [name] took over finances after hospitalization and noticed she had double paid quite a few bills in the past 9 months. She stopped driving 3-5 years ago due to vision loss in the right eye. Around the time she was hospitalized, she forgot she was cooking food on the stove. She was previously independent with  dressing and bathing, now aides are on standby and make sure she takes a shower. She states she is able to prepare her own food using the microwave. No paranoia or hallucinations, but [name] feels she is a little more depressed than before. She states mood is "just flat," she gets frustrated sometimes. Sleep is much better, no wandering behaviors.    She denies any headaches, dizziness, diplopia, dysarthria, dysphagia, neck/back pain, focal numbness/tingling/weakness, bowel/bladder dysfunction. No anosmia, tremors, no falls. She always ambulates with a cane for balance. No family history of dementia, no history of significant head injuries or alcohol use.   PREVIOUS MEDICATIONS:   CURRENT MEDICATIONS:  Outpatient Encounter Medications as of 04/21/2022  Medication Sig   alendronate (FOSAMAX) 70 MG tablet Take 70 mg by mouth once a week.   aspirin EC 81 MG EC tablet Take 1 tablet (81 mg total) by mouth at bedtime. Swallow whole.   docusate sodium (COLACE) 100 MG capsule Take 100 mg by mouth daily as needed.   hydrALAZINE (APRESOLINE) 25 MG tablet Take 1 tablet (25 mg total) by mouth 3 (three) times daily. Or every 8 hours   isosorbide mononitrate (IMDUR) 30 MG 24 hr tablet Take 1 tablet (30 mg total) by mouth daily.   lovastatin (MEVACOR) 40 MG tablet Take 1 tablet by mouth daily.   memantine (NAMENDA) 5 MG tablet Take half tablet nightly  for 1 week then increase to  1 tablet every night   metoprolol succinate (TOPROL-XL) 50 MG 24 hr tablet Take 1 tablet (50 mg total) by mouth daily. Take with or immediately following a meal.   Multiple Vitamins-Minerals (PRESERVISION AREDS 2) CAPS Take 1 capsule by mouth 2 (two) times daily.   spironolactone (ALDACTONE) 25 MG tablet Take 1 tablet (25 mg total) by mouth daily.   venlafaxine XR (EFFEXOR-XR) 150 MG 24 hr capsule Take 150 mg by mouth daily.   vitamin B-12 100 MCG tablet Take 1 tablet (100 mcg total) by mouth daily.   losartan (COZAAR) 100 MG tablet  Take 1 tablet (100 mg total) by mouth daily.   No facility-administered encounter medications on file as of 04/21/2022.       04/24/2022    9:00 PM  MMSE - Mini Mental State Exam  Orientation to time 2  Orientation to Place 5  Registration 2  Attention/ Calculation 5  Recall 2  Language- name 2 objects 2  Language- repeat 1  Language- follow 3 step command 3  Language- read & follow direction 1  Write a sentence 1  Copy design 0  Total score 24      04/19/2021    9:00 AM  Montreal Cognitive Assessment   Visuospatial/ Executive (0/5) 1  Naming (0/3) 3  Attention: Read list of digits (0/2) 2  Attention: Read list of letters (0/1) 0  Attention: Serial 7  subtraction starting at 100 (0/3) 2  Language: Repeat phrase (0/2) 2  Language : Fluency (0/1) 0  Abstraction (0/2) 0  Delayed Recall (0/5) 0  Orientation (0/6) 5  Total 15  Adjusted Score (based on education) 16    Objective:     PHYSICAL EXAMINATION:    VITALS:   Vitals:   04/21/22 0905  BP: 135/86  Pulse: 78  Resp: 18  SpO2: 96%  Weight: 128 lb (58.1 kg)  Height: 4\' 11"  (1.499 m)    GEN:  The patient appears stated age and is in NAD. HEENT:  Normocephalic, atraumatic.   Neurological examination:  General: NAD, well-groomed, appears stated age. Orientation: The patient is alert. Oriented to person, place and date Cranial nerves: There is good facial symmetry.The speech is fluent and clear. No aphasia or dysarthria. Fund of knowledge is appropriate. Recent and remote memory are impaired. Attention and concentration are reduced.  Able to name objects and repeat phrases.  Hearing is intact to conversational tone.    Sensation: Sensation is intact to light touch throughout Motor: Strength is at least antigravity x4. Tremors: none  DTR's 2/4 in UE/LE     Movement examination: Tone: There is normal tone in the UE/LE Abnormal movements:  no tremor.  No myoclonus.  No asterixis.   Coordination:  There is no  decremation with RAM's. Normal finger to nose  Gait and Station: The patient has some difficulty arising out of a deep-seated chair without the use of the hands, needs R cane. The patient's stride length is good.  Gait is cautious and narrow.    Thank you for allowing [place] the opportunity to participate in the care of this nice patient. Please do not hesitate to contact us for any questions or concerns.   Total time spent on today's visit was 20 minutes dedicated to this patient today, preparing to see patient, examining the patient, ordering tests and/or medications and counseling the patient, documenting clinical information in the EHR or other health record, independently interpreting results and communicating results to the patient/family, discussing treatment and goals, answering patient's questions and coordinating care.  Cc:  Jarrett Soho, PA-C  Marlowe Kays 04/24/2022 9:20 PM

## 2022-04-21 ENCOUNTER — Ambulatory Visit: Payer: Medicare Other | Admitting: Physician Assistant

## 2022-04-21 ENCOUNTER — Encounter: Payer: Self-pay | Admitting: Physician Assistant

## 2022-04-21 VITALS — BP 135/86 | HR 78 | Resp 18 | Ht 59.0 in | Wt 128.0 lb

## 2022-04-21 DIAGNOSIS — F02A0 Dementia in other diseases classified elsewhere, mild, without behavioral disturbance, psychotic disturbance, mood disturbance, and anxiety: Secondary | ICD-10-CM

## 2022-04-21 DIAGNOSIS — G309 Alzheimer's disease, unspecified: Secondary | ICD-10-CM

## 2022-04-21 NOTE — Patient Instructions (Addendum)
It was a pleasure to see you today at our office.   Recommendations:  Follow up in  6 months Continue donepezil 10 mg daily. Side effects were discussed  Continue Memantine 10 mg twice daily.Side effects were discussed   Whom to call:  Memory  decline, memory medications: Call our office [phone]   For psychiatric meds, mood meds: Please have your primary care physician manage these medications.   Counseling regarding caregiver distress, including caregiver depression, anxiety and issues regarding community resources, adult day care programs, adult living facilities, or memory care questions:   Feel free to contact [place], Social Worker at [phone]   For assessment of decision of mental capacity and competency:  Call Dr. Anthoney Harada, geriatric psychiatrist at [phone]  For guidance in geriatric dementia issues please call [facility] [phone]  For guidance regarding [facility] and if placement were needed at the facility, contact Arnell Asal, Social Worker tel: [phone]  If you have any severe symptoms of a stroke, or other severe issues such as confusion,severe chills or fever, etc call 911 or go to the ER as you may need to be evaluated further   Feel free to visit Facebook page " [employer]" for tips of how to care for people with memory problems.   Feel free to go to the following database for funded clinical studies conducted around the world: http://saunders.com/   https://www.triadclinicaltrials.com/     RECOMMENDATIONS FOR ALL PATIENTS WITH MEMORY PROBLEMS: 1. Continue to exercise (Recommend 30 minutes of walking everyday, or 3 hours every week) 2. Increase social interactions - continue going to [place] and enjoy social gatherings with friends and family 3. Eat healthy, avoid fried foods and eat more fruits and vegetables 4. Maintain adequate blood pressure, blood sugar, and blood cholesterol  level. Reducing the risk of stroke and cardiovascular disease also helps promoting better memory. 5. Avoid stressful situations. Live a simple life and avoid aggravations. Organize your time and prepare for the next day in anticipation. 6. Sleep well, avoid any interruptions of sleep and avoid any distractions in the bedroom that may interfere with adequate sleep quality 7. Avoid sugar, avoid sweets as there is a strong link between excessive sugar intake, diabetes, and cognitive impairment We discussed the Mediterranean diet, which has been shown to help patients reduce the risk of progressive memory disorders and reduces cardiovascular risk. This includes eating fish, eat fruits and green leafy vegetables, nuts like almonds and hazelnuts, walnuts, and also use olive oil. Avoid fast foods and fried foods as much as possible. Avoid sweets and sugar as sugar use has been linked to worsening of memory function.  There is always a concern of gradual progression of memory problems. If this is the case, then we may need to adjust level of care according to patient needs. Support, both to the patient and caregiver, should then be put into place.    The Alzheimer's Association is here all day, every day for people facing Alzheimer's disease through our free 24/7 Helpline: [phone]. The Helpline provides reliable information and support to all those who need assistance, such as individuals living with memory loss, Alzheimer's or other dementia, caregivers, health care professionals and the public.  Our highly trained and knowledgeable staff can help you with: Understanding memory loss, dementia and Alzheimer's  Medications and other treatment options  General information about aging and brain health  Skills to provide quality care and to find the best care from professionals  Legal, financial and living-arrangement decisions Our Helpline also features: Confidential care consultation provided by master's  level clinicians who can help with decision-making support, crisis assistance and education on issues families face every day  Help in a caller's preferred language using our translation service that features more than 200 languages and dialects  Referrals to local community programs, services and ongoing support     FALL PRECAUTIONS: Be cautious when walking. Scan the area for obstacles that may increase the risk of trips and falls. When getting up in the mornings, sit up at the edge of the bed for a few minutes before getting out of bed. Consider elevating the bed at the head end to avoid drop of blood pressure when getting up. Walk always in a well-lit room (use night lights in the walls). Avoid area rugs or power cords from appliances in the middle of the walkways. Use a walker or a cane if necessary and consider physical therapy for balance exercise. Get your eyesight checked regularly.  FINANCIAL OVERSIGHT: Supervision, especially oversight when making financial decisions or transactions is also recommended.  HOME SAFETY: Consider the safety of the kitchen when operating appliances like stoves, microwave oven, and blender. Consider having supervision and share cooking responsibilities until no longer able to participate in those. Accidents with firearms and other hazards in the house should be identified and addressed as well.   ABILITY TO BE LEFT ALONE: If patient is unable to contact 911 operator, consider using LifeLine, or when the need is there, arrange for someone to stay with patients. Smoking is a fire hazard, consider supervision or cessation. Risk of wandering should be assessed by caregiver and if detected at any point, supervision and safe proof recommendations should be instituted.  MEDICATION SUPERVISION: Inability to self-administer medication needs to be constantly addressed. Implement a mechanism to ensure safe administration of the medications.   DRIVING: Regarding driving,  in patients with progressive memory problems, driving will be impaired. We advise to have someone else do the driving if trouble finding directions or if minor accidents are reported. Independent driving assessment is available to determine safety of driving.   If you are interested in the driving assessment, you can contact the following:  [employer] in [place]  [place] [phone] or [phone]      [place] refers to food and lifestyle choices that are based on the traditions of countries located on the [employer]. This way of eating has been shown to help prevent certain conditions and improve outcomes for people who have chronic diseases, like kidney disease and heart disease. What are tips for following this plan? Lifestyle  Cook and eat meals together with your family, when possible. Drink enough fluid to keep your urine clear or pale yellow. Be physically active every day. This includes: Aerobic exercise like running or swimming. Leisure activities like gardening, walking, or housework. Get 7-8 hours of sleep each night. If recommended by your health care provider, drink red wine in moderation. This means 1 glass a day for nonpregnant women and 2 glasses a day for men. A glass of wine equals 5 oz (150 mL). Reading food labels  Check the serving size of packaged foods. For foods such as rice and pasta, the serving size refers to the amount of cooked product, not dry. Check the total fat in packaged foods. Avoid foods that have saturated fat or trans  fats. Check the ingredients list for added sugars, such as corn syrup. Shopping  At the grocery store, buy most of your food from the areas near the walls of the store. This includes: Fresh fruits and vegetables (produce). Grains, beans, nuts, and seeds. Some of these may  be available in unpackaged forms or large amounts (in bulk). Fresh seafood. Poultry and eggs. Low-fat dairy products. Buy whole ingredients instead of prepackaged foods. Buy fresh fruits and vegetables in-season from local farmers markets. Buy frozen fruits and vegetables in resealable bags. If you do not have access to quality fresh seafood, buy precooked frozen shrimp or canned fish, such as tuna, salmon, or sardines. Buy small amounts of raw or cooked vegetables, salads, or olives from the deli or salad bar at your store. Stock your pantry so you always have certain foods on hand, such as olive oil, canned tuna, canned tomatoes, rice, pasta, and beans. Cooking  Cook foods with extra-virgin olive oil instead of using butter or other vegetable oils. Have meat as a side dish, and have vegetables or grains as your main dish. This means having meat in small portions or adding small amounts of meat to foods like pasta or stew. Use beans or vegetables instead of meat in common dishes like chili or lasagna. Experiment with different cooking methods. Try roasting or broiling vegetables instead of steaming or sauteing them. Add frozen vegetables to soups, stews, pasta, or rice. Add nuts or seeds for added healthy fat at each meal. You can add these to yogurt, salads, or vegetable dishes. Marinate fish or vegetables using olive oil, lemon juice, garlic, and fresh herbs. Meal planning  Plan to eat 1 vegetarian meal one day each week. Try to work up to 2 vegetarian meals, if possible. Eat seafood 2 or more times a week. Have healthy snacks readily available, such as: Vegetable sticks with hummus. Greek yogurt. Fruit and nut trail mix. Eat balanced meals throughout the week. This includes: Fruit: 2-3 servings a day Vegetables: 4-5 servings a day Low-fat dairy: 2 servings a day Fish, poultry, or lean meat: 1 serving a day Beans and legumes: 2 or more servings a week Nuts and seeds: 1-2 servings a  day Whole grains: 6-8 servings a day Extra-virgin olive oil: 3-4 servings a day Limit red meat and sweets to only a few servings a month What are my food choices? Mediterranean diet Recommended Grains: Whole-grain pasta. Brown rice. Bulgar wheat. Polenta. Couscous. Whole-wheat bread. [place]. Vegetables: Artichokes. Beets. Broccoli. Cabbage. Carrots. Eggplant. Green beans. Chard. Kale. Spinach. Onions. Leeks. Peas. Squash. Tomatoes. Peppers. Radishes. Fruits: Apples. Apricots. Avocado. Berries. Bananas. Cherries. Dates. Figs. Grapes. Lemons. Melon. Oranges. Peaches. Plums. Pomegranate. Meats and other protein foods: Beans. Almonds. Sunflower seeds. Pine nuts. Peanuts. [place]. Salmon. Scallops. Shrimp. [place]. Tilapia. Clams. Oysters. Eggs. Dairy: Low-fat milk. Cheese. Greek yogurt. Beverages: Water. Red wine. Herbal tea. Fats and oils: Extra virgin olive oil. Avocado oil. Grape seed oil. Sweets and desserts: [place] yogurt with honey. Baked apples. Poached pears. Trail mix. Seasoning and other foods: Basil. Cilantro. Coriander. Cumin. Mint. Parsley. Sage. Rosemary. Tarragon. Garlic. Oregano. Thyme. Pepper. Balsalmic vinegar. Tahini. Hummus. Tomato sauce. Olives. Mushrooms. Limit these Grains: Prepackaged pasta or rice dishes. Prepackaged cereal with added sugar. Vegetables: Deep fried potatoes (french fries). Fruits: Fruit canned in syrup. Meats and other protein foods: Beef. Pork. Lamb. Poultry with skin. Hot dogs. [place] Salines. Dairy: Ice cream. Sour cream. Whole milk. Beverages: Juice. Sugar-sweetened soft drinks. Beer. Liquor and spirits. Fats and  oils: Butter. Canola oil. Vegetable oil. Beef fat (tallow). Lard. Sweets and desserts: Cookies. Cakes. Pies. Candy. Seasoning and other foods: Mayonnaise. Premade sauces and marinades. The items listed may not be a complete list. Talk with your dietitian about what dietary choices are right for you. Summary The Mediterranean diet includes both  food and lifestyle choices. Eat a variety of fresh fruits and vegetables, beans, nuts, seeds, and whole grains. Limit the amount of red meat and sweets that you eat. Talk with your health care provider about whether it is safe for you to drink red wine in moderation. This means 1 glass a day for nonpregnant women and 2 glasses a day for men. A glass of wine equals 5 oz (150 mL). This information is not intended to replace advice given to you by your health care provider. Make sure you discuss any questions you have with your health care provider. Document Released: 06/01/2016 Document Revised: 07/04/2016 Document Reviewed: 06/01/2016 Elsevier Interactive Patient Education  2017 [employer].

## 2022-05-13 NOTE — Progress Notes (Unsigned)
Cardiology Office Note:    Date:  05/15/2022   ID:  [name], DOB [birth year], MRN [id number]  PCP:  Denise Stalker, PA-C  Cardiologist:  Donato Heinz, MD  Electrophysiologist:  None   Referring MD: Denise Stalker, PA-C   No chief complaint on file.     History of Present Illness:    [name] is a 88 y.o. female with a hx of chronic combined systolic and diastolic heart failure hypertension, COPD who presents for hospital follow-up.  She was admitted to [facility] from 2/23 through 12/27/2020.  Had presented with shortness of breath with significantly elevated BP, and found to be significantly volume overloaded.  Echocardiogram on 12/16/20 showed EF 25 to 42%, grade 1 diastolic dysfunction, normal RV function, severe left atrial dilatation, moderate MR. RHC/LHC on 12/17/2020 showed minimal nonobstructive CAD, RA 4, RV 22/3, PA 32/11/22, PW 16, LVEDP 24.  Discharged on hydralazine 10 mg 3 times daily, Imdur 30 mg daily, losartan 25 mg daily, Aldactone 12.5 mg daily.  She developed AKI due to IV diuresis, was not discharged on diuresis.  Cardiac MRI on 02/28/2021 showed moderate LV dilatation with moderate systolic dysfunction (EF 35%), normal RV size and systolic function (EF 36%), RV insertion site LGE.  Zio patch x3 days on 01/26/2021 showed one 6 beat episode of NSVT, occasional PVCs (1.1%).  Echocardiogram 08/30/2021 showed EF 35 to 40%, global hypokinesis, grade 1 diastolic dysfunction, normal RV function, mild AI.  Since last clinic visit, she reports has been doing okay.  Continues to have pain in her left leg.  Denies any chest pain, dyspnea, lightheadedness, syncope, or lower extremity edema.  She is smoking 4 cigarettes/day   Wt Readings from Last 3 Encounters:  05/15/22 130 lb 3.2 oz (59.1 kg)  04/21/22 128 lb (58.1 kg)  12/13/21 123 lb 9.6 oz (56.1 kg)     Past Medical History:  Diagnosis Date   Acute combined systolic and diastolic CHF, NYHA class 4 (HCC)     Anxiety    Arthritis    knees, hands    Cancer (HCC)    SQUAMOuS CELL CARCINOMA OF SKIN- PERIANAL    COPD (chronic obstructive pulmonary disease) ([place])    smoker   Depression    Elbow fracture, right 2013   Hyperlipidemia    Hypertension    Osteoporosis    Pneumonia    hx of    S/P radiation therapy 04/08/2014-05/14/2014   Right perianal skin / 45 Gy in 25 fractions   Wears glasses     Past Surgical History:  Procedure Laterality Date   ABDOMINAL HYSTERECTOMY     TOTAL VAGINAL HYSTERECTOMY   APPENDECTOMY     CHOLECYSTECTOMY     ELBOW FRACTURE SURGERY  2013   rt   EVALUATION UNDER ANESTHESIA WITH HEMORRHOIDECTOMY N/A 05/29/2016   Procedure: EXAM UNDER ANESTHESIA WITH INTERNAL AND EXTERNAL HEMORRHOIDECTOMY;  Surgeon: Georganna Skeans, MD;  Location: [place];  Service: General;  Laterality: N/A;   EYE SURGERY     left eye cataract surgery    HEMORRHOID SURGERY N/A 02/06/2014   Procedure: EXCISION PERIANAL MASS;  Surgeon: Zenovia Jarred, MD;  Location: [place];  Service: General;  Laterality: N/A;   HERNIA REPAIR     left inguinal hernia repair    I & D EXTREMITY Left 03/05/2013   Procedure: Irrigation and Debridement Olecranon Bursa/Left Elbow;  Surgeon: Linna Hoff, MD;  Location: [place];  Service: Orthopedics;  Laterality: Left;   JOINT REPLACEMENT     right and left hip replacement    MASS EXCISION N/A 05/24/2015   Procedure: EXCISION ANAL MASS X 2;  Surgeon: Georganna Skeans, MD;  Location: [place];  Service: General;  Laterality: N/A;   OTHER SURGICAL HISTORY     arthroscopic right knee    OTHER SURGICAL HISTORY     right carpal tunnel   OTHER SURGICAL HISTORY     right elbow surgery    OTHER SURGICAL HISTORY     oophorectomy 1958   RECTAL BIOPSY N/A 05/29/2016   Procedure: ANAL BIOPSY;  Surgeon: Georganna Skeans, MD;  Location: [place];  Service: General;  Laterality: N/A;   RIGHT/LEFT HEART CATH AND  CORONARY ANGIOGRAPHY N/A 12/17/2020   Procedure: RIGHT/LEFT HEART CATH AND CORONARY ANGIOGRAPHY;  Surgeon: Martinique, Peter M, MD;  Location: [place] [facility];  Service: Cardiovascular;  Laterality: N/A;   TOTAL KNEE ARTHROPLASTY  12/13/2011   Procedure: TOTAL KNEE ARTHROPLASTY;  Surgeon: Tobi Bastos, MD;  Location: [facility];  Service: Orthopedics;  Laterality: Right;    Current Medications: Current Meds  Medication Sig   alendronate (FOSAMAX) 70 MG tablet Take 70 mg by mouth once a week.   aspirin EC 81 MG EC tablet Take 1 tablet (81 mg total) by mouth at bedtime. Swallow whole.   docusate sodium (COLACE) 100 MG capsule Take 100 mg by mouth daily as needed.   hydrALAZINE (APRESOLINE) 25 MG tablet Take 1 tablet (25 mg total) by mouth 3 (three) times daily. Or every 8 hours   isosorbide mononitrate (IMDUR) 30 MG 24 hr tablet Take 1 tablet (30 mg total) by mouth daily.   lovastatin (MEVACOR) 40 MG tablet Take 1 tablet by mouth daily.   memantine (NAMENDA) 5 MG tablet Take half tablet nightly  for 1 week then increase to  1 tablet every night   metoprolol succinate (TOPROL-XL) 50 MG 24 hr tablet Take 1 tablet (50 mg total) by mouth daily. Take with or immediately following a meal.   Multiple Vitamins-Minerals (PRESERVISION AREDS 2) CAPS Take 1 capsule by mouth 2 (two) times daily.   spironolactone (ALDACTONE) 25 MG tablet Take 1 tablet (25 mg total) by mouth daily.   venlafaxine XR (EFFEXOR-XR) 150 MG 24 hr capsule Take 150 mg by mouth daily.   vitamin B-12 100 MCG tablet Take 1 tablet (100 mcg total) by mouth daily.     Allergies:   Other and Prolia [denosumab]   Social History   Socioeconomic History   Marital status: Widowed    Spouse name: Not on file   Number of children: Not on file   Years of education: Not on file   Highest education level: Not on file  Occupational History   Not on file  Tobacco Use   Smoking status: Every Day    Packs/day: 0.25    Years: 50.00    Total  pack years: 12.50    Types: Cigarettes    Passive exposure: Never   Smokeless tobacco: Never  Vaping Use   Vaping Use: Never used  Substance and Sexual Activity   Alcohol use: No   Drug use: No   Sexual activity: Not Currently  Other Topics Concern   Not on file  Social History Narrative   Right Handed    Lives in a one story home    Drinks Caffeine   Social Determinants of Health   Financial Resource Strain:  Low Risk  (12/20/2020)   Overall Financial Resource Strain (CARDIA)    Difficulty of Paying Living Expenses: Not very hard  Food Insecurity: No Food Insecurity (12/20/2020)   Hunger Vital Sign    Worried About Running Out of Food in the Last Year: Never true    Ran Out of Food in the Last Year: Never true  Transportation Needs: No Transportation Needs (12/20/2020)   PRAPARE - Hydrologist (Medical): No    Lack of Transportation (Non-Medical): No  Physical Activity: Inactive (12/20/2020)   Exercise Vital Sign    Days of Exercise per Week: 0 days    Minutes of Exercise per Session: 0 min  Stress: Not on file  Social Connections: Not on file     Family History: The patient's family history includes Arthritis in her father; Cancer in her father; Cancer (age of onset: 16) in her child; Cancer (age of onset: 45) in her sister; Hypertension in her mother.  ROS:   Please see the history of present illness.    All other systems reviewed and are negative.  EKGs/Labs/Other Studies Reviewed:    The following studies were reviewed today:   EKG:   09/20/21: Normal sinus rhythm, rate 68, right atrial enlargement, right axis deviation, Q waves in V1/2, T wave inversions in leads III, aVF 05/15/2022: Normal sinus rhythm, rate 81, left axis deviation, poor R wave progression  Recent Labs: 06/08/2021: BNP 606.8 09/20/2021: BUN 14; Creatinine, Ser 0.75; Magnesium 2.2; Potassium 4.0; Sodium 142  Recent Lipid Panel    Component Value Date/Time   CHOL  119 07/08/2021 1408   TRIG 124 07/08/2021 1408   HDL 52 07/08/2021 1408   CHOLHDL 2.3 07/08/2021 1408   LDLCALC 45 07/08/2021 1408    Physical Exam:    VS:  BP (!) 149/91   Pulse 80   Ht 5' (1.524 m)   Wt 130 lb 3.2 oz (59.1 kg)   BMI 25.43 kg/m     Wt Readings from Last 3 Encounters:  05/15/22 130 lb 3.2 oz (59.1 kg)  04/21/22 128 lb (58.1 kg)  12/13/21 123 lb 9.6 oz (56.1 kg)     GEN: Well nourished, well developed in no acute distress HEENT: Normal NECK: No JVD; No carotid bruits LYMPHATICS: scattered crackles at left base CARDIAC: RRR, no murmurs, rubs, gallops RESPIRATORY:  Clear to auscultation without rales, wheezing or rhonchi  ABDOMEN: Soft, non-tender, non-distended MUSCULOSKELETAL:  No edema; No deformity  SKIN: Warm and dry NEUROLOGIC:  Alert and oriented x 3 PSYCHIATRIC:  Normal affect   ASSESSMENT:    1. Chronic combined systolic and diastolic heart failure ([place])   2. Essential hypertension   3. PAD (peripheral artery disease) ([place])   4. Tobacco use      PLAN:    Chronic combined systolic and diastolic heart failure: Echocardiogram on 12/16/20 showed EF 25 to 77%, grade 1 diastolic dysfunction, normal RV function, severe left atrial dilatation, moderate MR. RHC/LHC on 12/17/2020 showed minimal nonobstructive CAD, RA 4, RV 22/3, PA 32/11/22, PW 16, LVEDP 24.  Cardiac MRI on 02/28/2021 showed moderate LV dilatation with moderate systolic dysfunction (EF 82%), normal RV size and systolic function (EF 42%), RV insertion site LGE. Echocardiogram 08/30/2021 showed EF 35 to 40%, global hypokinesis, grade 1 diastolic dysfunction, normal RV function, mild AI. -Continue losartan 100 mg daily -Continue Imdur 30 mg daily/hydralazine 25 mg 3 times daily -Continue spironolactone 25 mg daily.  Will check BMP.   -  Delene Loll and [place] are cost prohibitive -Continue Toprol-XL 50 mg daily.  This was not started during her hospitalization due to reported intermittent  bradycardia.  Zio patch x3 days on 01/26/2021 showed one 6 beat episode of NSVT, occasional PVCs (1.1%), no issues with bradycardia, average heart rate 76 bpm.  She has been tolerating Toprol-XL without any issues. -Plan repeat echocardiogram prior to next clinic visit  Hypertension: Continue losartan, Imdur/hydralazine, spironolactone, Toprol-XL as above.  BP elevated in clinic today, asked her to check BP daily for next week and call with results  Tobacco use: Counseled on the risk of tobacco use and cessation strongly recommended  PAD: ABIs any 2922 showed moderate disease (0.75) on left, normal (1.10) on right.  Ultrasound showed 75 to 99% stenosis in the left TPT, along with 30 to 49% stenosis in the R proximal CFA, 50 to 74% stenosis in the R mid SFA. -Continue aspirin 81 mg daily -Continue lovastatin 40 mg daily, LDL 45 on 07/08/2021 -Smoking cessation strongly recommended -Regular walking program recommended -She is continuing to have pain in her left leg, will refer to Dr Berry/Dr Fletcher Anon for evaluation   RTC in 4 months  Medication Adjustments/Labs and Tests Ordered: Current medicines are reviewed at length with the patient today.  Concerns regarding medicines are outlined above.  Orders Placed This Encounter  Procedures   Basic metabolic panel   CBC   Ambulatory Referral for Peripheral Vascular Consult   EKG 12-Lead   ECHOCARDIOGRAM COMPLETE     No orders of the defined types were placed in this encounter.    Patient Instructions  Medication Instructions:  Your physician recommends that you continue on your current medications as directed. Please refer to the Current Medication list given to you today.  *If you need a refill on your cardiac medications before your next appointment, please call your pharmacy*   Lab Work: BMET, CBC today  If you have labs (blood work) drawn today and your tests are completely normal, you will receive your results only by: [place] (if you have MyChart) OR A paper copy in the mail If you have any lab test that is abnormal or we need to change your treatment, we will call you to review the results.  Testing: Your physician has requested that you have an echocardiogram in 4 MONTHS. Echocardiography is a painless test that uses sound waves to create images of your heart. It provides your doctor with information about the size and shape of your heart and how well your heart's chambers and valves are working. This procedure takes approximately one hour. There are no restrictions for this procedure.   Follow-Up: At [facility], you and your health needs are our priority.  As part of our continuing mission to provide you with exceptional heart care, we have created designated Provider Care Teams.  These Care Teams include your primary Cardiologist (physician) and Advanced Practice Providers (APPs -  Physician Assistants and Nurse Practitioners) who all work together to provide you with the care you need, when you need it.  We recommend signing up for the patient portal called "MyChart".  Sign up information is provided on this After Visit Summary.  MyChart is used to connect with patients for Virtual Visits (Telemedicine).  Patients are able to view lab/test results, encounter notes, upcoming appointments, etc.  Non-urgent messages can be sent to your provider as well.   To learn more about what you can do with MyChart, go to [email or web address].  Your next appointment:   4 month(s)  The format for your next appointment:   In Person  Provider:   Donato Heinz, MD {  Other Instructions You have been referred to: Peripheral vascular specialist  Please check your blood pressure at home daily, write it down.  Call the office or send message via Mychart with the readings in 2 weeks for Dr. Gardiner Rhyme to review.     Important Information About Sugar          Signed, Donato Heinz, MD   05/15/2022 10:46 AM    [place]

## 2022-05-15 ENCOUNTER — Ambulatory Visit (INDEPENDENT_AMBULATORY_CARE_PROVIDER_SITE_OTHER): Payer: Medicare Other | Admitting: Cardiology

## 2022-05-15 ENCOUNTER — Encounter: Payer: Self-pay | Admitting: Cardiology

## 2022-05-15 VITALS — BP 149/91 | HR 80 | Ht 60.0 in | Wt 130.2 lb

## 2022-05-15 DIAGNOSIS — Z720 Tobacco use: Secondary | ICD-10-CM

## 2022-05-15 DIAGNOSIS — I739 Peripheral vascular disease, unspecified: Secondary | ICD-10-CM | POA: Diagnosis not present

## 2022-05-15 DIAGNOSIS — I10 Essential (primary) hypertension: Secondary | ICD-10-CM

## 2022-05-15 DIAGNOSIS — I5042 Chronic combined systolic (congestive) and diastolic (congestive) heart failure: Secondary | ICD-10-CM | POA: Diagnosis not present

## 2022-05-15 NOTE — Patient Instructions (Addendum)
Medication Instructions:  Your physician recommends that you continue on your current medications as directed. Please refer to the Current Medication list given to you today.  *If you need a refill on your cardiac medications before your next appointment, please call your pharmacy*   Lab Work: BMET, CBC today  If you have labs (blood work) drawn today and your tests are completely normal, you will receive your results only by: [place] (if you have MyChart) OR A paper copy in the mail If you have any lab test that is abnormal or we need to change your treatment, we will call you to review the results.  Testing: Your physician has requested that you have an echocardiogram in 4 MONTHS. Echocardiography is a painless test that uses sound waves to create images of your heart. It provides your doctor with information about the size and shape of your heart and how well your heart's chambers and valves are working. This procedure takes approximately one hour. There are no restrictions for this procedure.   Follow-Up: At [facility], you and your health needs are our priority.  As part of our continuing mission to provide you with exceptional heart care, we have created designated Provider Care Teams.  These Care Teams include your primary Cardiologist (physician) and Advanced Practice Providers (APPs -  Physician Assistants and Nurse Practitioners) who all work together to provide you with the care you need, when you need it.  We recommend signing up for the patient portal called "MyChart".  Sign up information is provided on this After Visit Summary.  MyChart is used to connect with patients for Virtual Visits (Telemedicine).  Patients are able to view lab/test results, encounter notes, upcoming appointments, etc.  Non-urgent messages can be sent to your provider as well.   To learn more about what you can do with MyChart, go to [email or web address].    Your next appointment:   4  month(s)  The format for your next appointment:   In Person  Provider:   Donato Heinz, MD {  Other Instructions You have been referred to: Peripheral vascular specialist  Please check your blood pressure at home daily, write it down.  Call the office or send message via Mychart with the readings in 2 weeks for Dr. Gardiner Rhyme to review.     Important Information About Sugar

## 2022-05-16 ENCOUNTER — Encounter: Payer: Self-pay | Admitting: Cardiovascular Disease

## 2022-05-16 ENCOUNTER — Ambulatory Visit: Payer: Medicare Other | Admitting: Cardiovascular Disease

## 2022-05-16 VITALS — BP 122/78 | HR 88 | Ht 60.0 in | Wt 128.0 lb

## 2022-05-16 DIAGNOSIS — I5022 Chronic systolic (congestive) heart failure: Secondary | ICD-10-CM | POA: Diagnosis not present

## 2022-05-16 DIAGNOSIS — I10 Essential (primary) hypertension: Secondary | ICD-10-CM | POA: Diagnosis not present

## 2022-05-16 DIAGNOSIS — I739 Peripheral vascular disease, unspecified: Secondary | ICD-10-CM

## 2022-05-16 DIAGNOSIS — Z720 Tobacco use: Secondary | ICD-10-CM

## 2022-05-16 LAB — BASIC METABOLIC PANEL
BUN/Creatinine Ratio: 32 — ABNORMAL HIGH (ref 12–28)
BUN: 21 mg/dL (ref 8–27)
CO2: 24 mmol/L (ref 20–29)
Calcium: 10.7 mg/dL — ABNORMAL HIGH (ref 8.7–10.3)
Chloride: 104 mmol/L (ref 96–106)
Creatinine, Ser: 0.66 mg/dL (ref 0.57–1.00)
Glucose: 80 mg/dL (ref 70–99)
Potassium: 4.6 mmol/L (ref 3.5–5.2)
Sodium: 144 mmol/L (ref 134–144)
eGFR: 85 mL/min/{1.73_m2} (ref >59)

## 2022-05-16 LAB — CBC
Hematocrit: 48.2 % — ABNORMAL HIGH (ref 34.0–46.6)
Hemoglobin: 15.6 g/dL (ref 11.1–15.9)
MCH: 32 pg (ref 26.6–33.0)
MCHC: 32.4 g/dL (ref 31.5–35.7)
MCV: 99 fL — ABNORMAL HIGH (ref 79–97)
Platelets: 212 10*3/uL (ref 150–450)
RBC: 4.87 x10E6/uL (ref 3.77–5.28)
RDW: 12.3 % (ref 11.7–15.4)
WBC: 9.7 10*3/uL (ref 3.4–10.8)

## 2022-05-16 NOTE — Patient Instructions (Signed)
Medication Instructions:  Your physician recommends that you continue on your current medications as directed. Please refer to the Current Medication list given to you today.  *If you need a refill on your cardiac medications before your next appointment, please call your pharmacy*  Follow-Up: At [facility], you and your health needs are our priority.  As part of our continuing mission to provide you with exceptional heart care, we have created designated Provider Care Teams.  These Care Teams include your primary Cardiologist (physician) and Advanced Practice Providers (APPs -  Physician Assistants and Nurse Practitioners) who all work together to provide you with the care you need, when you need it.  We recommend signing up for the patient portal called "MyChart".  Sign up information is provided on this After Visit Summary.  MyChart is used to connect with patients for Virtual Visits (Telemedicine).  Patients are able to view lab/test results, encounter notes, upcoming appointments, etc.  Non-urgent messages can be sent to your provider as well.   To learn more about what you can do with MyChart, go to [email or web address].    Your next appointment:   6 month(s)  The format for your next appointment:   In Person  Provider:   Dr. Fletcher Anon   Important Information About Sugar

## 2022-05-16 NOTE — Progress Notes (Signed)
Cardiology Office Note   Date:  05/16/2022   ID:  [name], DOB [birth year], MRN [id number]  PCP:  Denise Stalker, PA-C  Cardiologist:  Dr. Gardiner Rhyme  Chief Complaint  Patient presents with   Follow-up      History of Present Illness: [name] is a 88 y.o. female who was referred by Dr. Gardiner Rhyme for evaluation management of peripheral arterial disease. She has known history of chronic systolic heart failure, COPD, essential hypertension and hyperlipidemia. She was hospitalized in February with acute systolic heart failure with an EF of 25 to 30% with moderate mitral regurgitation.  Cardiac catheterization showed minimal nonobstructive coronary artery disease.  Cardiac MRI showed an EF of 30%.  She reports left calf claudication after walking about 50 feet.  In addition, she has some discomfort in the left calf just before she goes to bed but the symptoms are overall mild.  She has no lower extremity ulceration.  She walks slowly with a cane.  She underwent noninvasive vascular studies in August 2022 which showed an ABI of 1.10 on the right and 0.75 on the left.  Duplex on the right showed significant mid SFA stenosis.  On the left, there was severe stenosis in the TP trunk with three-vessel runoff below the knee.  Past Medical History:  Diagnosis Date   Acute combined systolic and diastolic CHF, NYHA class 4 (HCC)    Anxiety    Arthritis    knees, hands    Cancer (HCC)    SQUAMOuS CELL CARCINOMA OF SKIN- PERIANAL    COPD (chronic obstructive pulmonary disease) (HCC)    smoker   Depression    Elbow fracture, right 2013   Hyperlipidemia    Hypertension    Osteoporosis    Pneumonia    hx of    S/P radiation therapy 04/08/2014-05/14/2014   Right perianal skin / 45 Gy in 25 fractions   Wears glasses     Past Surgical History:  Procedure Laterality Date   ABDOMINAL HYSTERECTOMY     TOTAL VAGINAL HYSTERECTOMY   APPENDECTOMY     CHOLECYSTECTOMY     ELBOW  FRACTURE SURGERY  2013   rt   EVALUATION UNDER ANESTHESIA WITH HEMORRHOIDECTOMY N/A 05/29/2016   Procedure: EXAM UNDER ANESTHESIA WITH INTERNAL AND EXTERNAL HEMORRHOIDECTOMY;  Surgeon: Georganna Skeans, MD;  Location: [place];  Service: General;  Laterality: N/A;   EYE SURGERY     left eye cataract surgery    HEMORRHOID SURGERY N/A 02/06/2014   Procedure: EXCISION PERIANAL MASS;  Surgeon: Zenovia Jarred, MD;  Location: [place];  Service: General;  Laterality: N/A;   HERNIA REPAIR     left inguinal hernia repair    I & D EXTREMITY Left 03/05/2013   Procedure: Irrigation and Debridement Olecranon Bursa/Left Elbow;  Surgeon: Linna Hoff, MD;  Location: [place];  Service: Orthopedics;  Laterality: Left;   JOINT REPLACEMENT     right and left hip replacement    MASS EXCISION N/A 05/24/2015   Procedure: EXCISION ANAL MASS X 2;  Surgeon: Georganna Skeans, MD;  Location: [facility];  Service: General;  Laterality: N/A;   OTHER SURGICAL HISTORY     arthroscopic right knee    OTHER SURGICAL HISTORY     right carpal tunnel   OTHER SURGICAL HISTORY     right elbow surgery    OTHER SURGICAL HISTORY     oophorectomy 1958   RECTAL  BIOPSY N/A 05/29/2016   Procedure: ANAL BIOPSY;  Surgeon: Georganna Skeans, MD;  Location: [facility];  Service: General;  Laterality: N/A;   RIGHT/LEFT HEART CATH AND CORONARY ANGIOGRAPHY N/A 12/17/2020   Procedure: RIGHT/LEFT HEART CATH AND CORONARY ANGIOGRAPHY;  Surgeon: Martinique, Peter M, MD;  Location: [facility];  Service: Cardiovascular;  Laterality: N/A;   TOTAL KNEE ARTHROPLASTY  12/13/2011   Procedure: TOTAL KNEE ARTHROPLASTY;  Surgeon: Tobi Bastos, MD;  [facility];  Service: Orthopedics;  Laterality: Right;     Current Outpatient Medications  Medication Sig Dispense Refill   alendronate (FOSAMAX) 70 MG tablet Take 70 mg by mouth once a week.     aspirin EC 81 MG EC tablet Take 1 tablet (81  mg total) by mouth at bedtime. Swallow whole. 30 tablet 11   docusate sodium (COLACE) 100 MG capsule Take 100 mg by mouth daily as needed.     hydrALAZINE (APRESOLINE) 25 MG tablet Take 1 tablet (25 mg total) by mouth 3 (three) times daily. Or every 8 hours 270 tablet 3   isosorbide mononitrate (IMDUR) 30 MG 24 hr tablet Take 1 tablet (30 mg total) by mouth daily. 30 tablet 11   losartan (COZAAR) 100 MG tablet Take 1 tablet (100 mg total) by mouth daily. 90 tablet 3   lovastatin (MEVACOR) 40 MG tablet Take 1 tablet by mouth daily.     memantine (NAMENDA) 5 MG tablet Take half tablet nightly  for 1 week then increase to  1 tablet every night 30 tablet 11   metoprolol succinate (TOPROL-XL) 50 MG 24 hr tablet Take 1 tablet (50 mg total) by mouth daily. Take with or immediately following a meal. 90 tablet 3   Multiple Vitamins-Minerals (PRESERVISION AREDS 2) CAPS Take 1 capsule by mouth 2 (two) times daily.     spironolactone (ALDACTONE) 25 MG tablet Take 1 tablet (25 mg total) by mouth daily. 90 tablet 3   venlafaxine XR (EFFEXOR-XR) 150 MG 24 hr capsule Take 150 mg by mouth daily.     vitamin B-12 100 MCG tablet Take 1 tablet (100 mcg total) by mouth daily. 30 tablet 0   No current facility-administered medications for this visit.    Allergies:   Other and Prolia [denosumab]    Social History:  The patient  reports that she has been smoking cigarettes. She has a 12.50 pack-year smoking history. She has never been exposed to tobacco smoke. She has never used smokeless tobacco. She reports that she does not drink alcohol and does not use drugs.   Family History:  The patient's family history includes Arthritis in her father; Cancer in her father; Cancer (age of onset: 76) in her child; Cancer (age of onset: 28) in her sister; Hypertension in her mother.    ROS:  Please see the history of present illness.   Otherwise, review of systems are positive for none.   All other systems are reviewed and  negative.    PHYSICAL EXAM: VS:  BP 122/78 (BP Location: Left Arm, Patient Position: Sitting, Cuff Size: Normal)   Pulse 88   Ht 5' (1.524 m)   Wt 128 lb (58.1 kg)   BMI 25.00 kg/m  , BMI Body mass index is 25 kg/m. GEN: Well nourished, well developed, in no acute distress  HEENT: normal  Neck: no JVD, carotid bruits, or masses Cardiac: RRR; no murmurs, rubs, or gallops,no edema  Respiratory:  clear to auscultation bilaterally, normal work  of breathing GI: soft, nontender, nondistended, + BS MS: no deformity or atrophy  Skin: warm and dry, no rash Neuro:  Strength and sensation are intact Psych: euthymic mood, full affect Vascular: Distal pulses are not palpable.   EKG:  EKG is not ordered today.    Recent Labs: 06/08/2021: BNP 606.8 09/20/2021: Magnesium 2.2 05/15/2022: BUN 21; Creatinine, Ser 0.66; Hemoglobin 15.6; Platelets 212; Potassium 4.6; Sodium 144    Lipid Panel    Component Value Date/Time   CHOL 119 07/08/2021 1408   TRIG 124 07/08/2021 1408   HDL 52 07/08/2021 1408   CHOLHDL 2.3 07/08/2021 1408   LDLCALC 45 07/08/2021 1408      Wt Readings from Last 3 Encounters:  05/16/22 128 lb (58.1 kg)  05/15/22 130 lb 3.2 oz (59.1 kg)  04/21/22 128 lb (58.1 kg)          No data to display            ASSESSMENT AND PLAN:  1.  Peripheral arterial disease: The patient has evidence of peripheral arterial disease especially on the left side with significant stenosis in the TP trunk.  She does have left calf claudication but her symptoms are not severe and not lifestyle limiting.  In addition, she has no evidence of critical limb ischemia.  Considering her age and comorbidities, I only recommend angiography and revascularization if she has worsening symptoms. Continue aspirin daily.  I instructed her to start walking more.  Cilostazol is not a good option considering her history of heart failure.  2.  Chronic systolic heart failure: She appears to be  euvolemic and currently on good medical therapy.  3.  Essential hypertension: Blood pressure is well controlled on current medications.  4.  Tobacco use: We will address this during next visit.   Disposition:   FU with me in 6 months  Signed,  Kathlyn Sacramento, MD  05/16/2022 10:40 AM    [place]

## 2022-09-11 ENCOUNTER — Ambulatory Visit (HOSPITAL_COMMUNITY): Payer: Medicare Other | Attending: Cardiology

## 2022-09-11 DIAGNOSIS — I5042 Chronic combined systolic (congestive) and diastolic (congestive) heart failure: Secondary | ICD-10-CM | POA: Diagnosis present

## 2022-09-11 LAB — ECHOCARDIOGRAM COMPLETE
Area-P 1/2: 2.29 cm2
S' Lateral: 2.3 cm

## 2022-09-12 NOTE — Progress Notes (Unsigned)
The Echo exam was challenging due to patient being unable to position.  She was in immense hip pain, and was unable to lay on her left side for much of the exam.  She did not wish to reschedule, and therefor the exam was performed with a combination of sitting upright on the side of the bed, and standing.  Patient (and accompanying family members) were informed that images may be lesser quality due to inability to lay on her left side.  She wished to proceed with imaging in this setting.    Deliah Boston, RDCS

## 2022-09-21 ENCOUNTER — Encounter: Payer: Self-pay | Admitting: Cardiology

## 2022-09-21 ENCOUNTER — Ambulatory Visit: Payer: Medicare Other | Attending: Cardiology | Admitting: Cardiology

## 2022-09-21 VITALS — BP 106/66 | HR 77 | Ht 60.0 in | Wt 130.6 lb

## 2022-09-21 DIAGNOSIS — I10 Essential (primary) hypertension: Secondary | ICD-10-CM

## 2022-09-21 DIAGNOSIS — I5042 Chronic combined systolic (congestive) and diastolic (congestive) heart failure: Secondary | ICD-10-CM | POA: Diagnosis not present

## 2022-09-21 DIAGNOSIS — I739 Peripheral vascular disease, unspecified: Secondary | ICD-10-CM | POA: Diagnosis not present

## 2022-09-21 DIAGNOSIS — Z720 Tobacco use: Secondary | ICD-10-CM | POA: Diagnosis not present

## 2022-09-21 NOTE — Patient Instructions (Signed)
Medication Instructions:  Your physician recommends that you continue on your current medications as directed. Please refer to the Current Medication list given to you today.  *If you need a refill on your cardiac medications before your next appointment, please call your pharmacy*  Lab Work: BMET, [place] today  If you have labs (blood work) drawn today and your tests are completely normal, you will receive your results only by: [place] (if you have MyChart) OR A paper copy in the mail If you have any lab test that is abnormal or we need to change your treatment, we will call you to review the results.  Follow-Up: At [facility], you and your health needs are our priority.  As part of our continuing mission to provide you with exceptional heart care, we have created designated Provider Care Teams.  These Care Teams include your primary Cardiologist (physician) and Advanced Practice Providers (APPs -  Physician Assistants and Nurse Practitioners) who all work together to provide you with the care you need, when you need it.  We recommend signing up for the patient portal called "MyChart".  Sign up information is provided on this After Visit Summary.  MyChart is used to connect with patients for Virtual Visits (Telemedicine).  Patients are able to view lab/test results, encounter notes, upcoming appointments, etc.  Non-urgent messages can be sent to your provider as well.   To learn more about what you can do with MyChart, go to [email or web address].    Your next appointment:   6 month(s)  The format for your next appointment:   In Person  Provider:   Donato Heinz, MD

## 2022-09-21 NOTE — Progress Notes (Signed)
Cardiology Office Note:    Date:  09/21/2022   ID:  [name], DOB 02-02-1934, MRN [id number]  PCP:  Marda Stalker, PA-C  Cardiologist:  Donato Heinz, MD  Electrophysiologist:  None   Referring MD: Marda Stalker, PA-C   No chief complaint on file.     History of Present Illness:    [name] is a 88 y.o. female with a hx of chronic combined systolic and diastolic heart failure hypertension, COPD who presents for hospital follow-up.  She was admitted to [facility] from 2/23 through 12/27/2020.  Had presented with shortness of breath with significantly elevated BP, and found to be significantly volume overloaded.  Echocardiogram on 12/16/20 showed EF 25 to 92%, grade 1 diastolic dysfunction, normal RV function, severe left atrial dilatation, moderate MR. RHC/LHC on 12/17/2020 showed minimal nonobstructive CAD, RA 4, RV 22/3, PA 32/11/22, PW 16, LVEDP 24.  Discharged on hydralazine 10 mg 3 times daily, Imdur 30 mg daily, losartan 25 mg daily, Aldactone 12.5 mg daily.  She developed AKI due to IV diuresis, was not discharged on diuresis.  Cardiac MRI on 02/28/2021 showed moderate LV dilatation with moderate systolic dysfunction (EF 11%), normal RV size and systolic function (EF 94%), RV insertion site LGE.  Zio patch x3 days on 01/26/2021 showed one 6 beat episode of NSVT, occasional PVCs (1.1%).  Echocardiogram 08/30/2021 showed EF 35 to 40%, global hypokinesis, grade 1 diastolic dysfunction, normal RV function, mild AI.  Echocardiogram 09/11/22 showed EF 60-65%, mild LVH, normal RV function, mild LAE.  Since last clinic visit, she reports that she is doing well.  Denies any chest pain, dyspnea, lightheadedness, syncope, lower extremity edema, or palpitations.  Reports occasional cramping in left leg.  Continues to smoke 4 cigarettes/day.   Wt Readings from Last 3 Encounters:  09/21/22 130 lb 9.6 oz (59.2 kg)  05/16/22 128 lb (58.1 kg)  05/15/22 130 lb 3.2 oz (59.1 kg)      Past Medical History:  Diagnosis Date   Acute combined systolic and diastolic CHF, NYHA class 4 (HCC)    Anxiety    Arthritis    knees, hands    Cancer (HCC)    SQUAMOuS CELL CARCINOMA OF SKIN- PERIANAL    COPD (chronic obstructive pulmonary disease) ([place])    smoker   Depression    Elbow fracture, right 2013   Hyperlipidemia    Hypertension    Osteoporosis    Pneumonia    hx of    S/P radiation therapy 04/08/2014-05/14/2014   Right perianal skin / 45 Gy in 25 fractions   Wears glasses     Past Surgical History:  Procedure Laterality Date   ABDOMINAL HYSTERECTOMY     TOTAL VAGINAL HYSTERECTOMY   APPENDECTOMY     CHOLECYSTECTOMY     ELBOW FRACTURE SURGERY  2013   rt   EVALUATION UNDER ANESTHESIA WITH HEMORRHOIDECTOMY N/A 05/29/2016   Procedure: EXAM UNDER ANESTHESIA WITH INTERNAL AND EXTERNAL HEMORRHOIDECTOMY;  Surgeon: Georganna Skeans, MD;  Location: [place];  Service: General;  Laterality: N/A;   EYE SURGERY     left eye cataract surgery    HEMORRHOID SURGERY N/A 02/06/2014   Procedure: EXCISION PERIANAL MASS;  Surgeon: Zenovia Jarred, MD;  Location: [place];  Service: General;  Laterality: N/A;   HERNIA REPAIR     left inguinal hernia repair    I & D EXTREMITY Left 03/05/2013   Procedure: Irrigation and Debridement Olecranon Bursa/Left Elbow;  Surgeon: Linna Hoff, MD;  Location: [place];  Service: Orthopedics;  Laterality: Left;   JOINT REPLACEMENT     right and left hip replacement    MASS EXCISION N/A 05/24/2015   Procedure: EXCISION ANAL MASS X 2;  Surgeon: Georganna Skeans, MD;  Location: [place];  Service: General;  Laterality: N/A;   OTHER SURGICAL HISTORY     arthroscopic right knee    OTHER SURGICAL HISTORY     right carpal tunnel   OTHER SURGICAL HISTORY     right elbow surgery    OTHER SURGICAL HISTORY     oophorectomy 1958   RECTAL BIOPSY N/A 05/29/2016   Procedure: ANAL BIOPSY;  Surgeon: Georganna Skeans, MD;  Location: [place];  Service: General;  Laterality: N/A;   RIGHT/LEFT HEART CATH AND CORONARY ANGIOGRAPHY N/A 12/17/2020   Procedure: RIGHT/LEFT HEART CATH AND CORONARY ANGIOGRAPHY;  Surgeon: Martinique, Peter M, MD;  Location: [place] [facility];  Service: Cardiovascular;  Laterality: N/A;   TOTAL KNEE ARTHROPLASTY  12/13/2011   Procedure: TOTAL KNEE ARTHROPLASTY;  Surgeon: Tobi Bastos, MD;  [facility];  Service: Orthopedics;  Laterality: Right;    Current Medications: Current Meds  Medication Sig   alendronate (FOSAMAX) 70 MG tablet Take 70 mg by mouth once a week.   aspirin EC 81 MG EC tablet Take 1 tablet (81 mg total) by mouth at bedtime. Swallow whole.   docusate sodium (COLACE) 100 MG capsule Take 100 mg by mouth daily as needed.   hydrALAZINE (APRESOLINE) 25 MG tablet Take 1 tablet (25 mg total) by mouth 3 (three) times daily. Or every 8 hours   isosorbide mononitrate (IMDUR) 30 MG 24 hr tablet Take 1 tablet (30 mg total) by mouth daily.   lovastatin (MEVACOR) 40 MG tablet Take 1 tablet by mouth daily.   memantine (NAMENDA) 5 MG tablet Take half tablet nightly  for 1 week then increase to  1 tablet every night   metoprolol succinate (TOPROL-XL) 50 MG 24 hr tablet Take 1 tablet (50 mg total) by mouth daily. Take with or immediately following a meal.   Multiple Vitamins-Minerals (PRESERVISION AREDS 2) CAPS Take 1 capsule by mouth 2 (two) times daily.   spironolactone (ALDACTONE) 25 MG tablet Take 1 tablet (25 mg total) by mouth daily.   venlafaxine XR (EFFEXOR-XR) 150 MG 24 hr capsule Take 150 mg by mouth daily.   vitamin B-12 100 MCG tablet Take 1 tablet (100 mcg total) by mouth daily.     Allergies:   Other and Prolia [denosumab]   Social History   Socioeconomic History   Marital status: Widowed    Spouse name: Not on file   Number of children: Not on file   Years of education: Not on file   Highest education level: Not on file   Occupational History   Not on file  Tobacco Use   Smoking status: Every Day    Packs/day: 0.25    Years: 50.00    Total pack years: 12.50    Types: Cigarettes    Passive exposure: Never   Smokeless tobacco: Never  Vaping Use   Vaping Use: Never used  Substance and Sexual Activity   Alcohol use: No   Drug use: No   Sexual activity: Not Currently  Other Topics Concern   Not on file  Social History Narrative   Right Handed    Lives in a one story home  Drinks Caffeine   Social Determinants of Health   Financial Resource Strain: Low Risk  (12/20/2020)   Overall Financial Resource Strain (CARDIA)    Difficulty of Paying Living Expenses: Not very hard  Food Insecurity: No Food Insecurity (12/20/2020)   Hunger Vital Sign    Worried About Running Out of Food in the Last Year: Never true    Ran Out of Food in the Last Year: Never true  Transportation Needs: No Transportation Needs (12/20/2020)   PRAPARE - Hydrologist (Medical): No    Lack of Transportation (Non-Medical): No  Physical Activity: Inactive (12/20/2020)   Exercise Vital Sign    Days of Exercise per Week: 0 days    Minutes of Exercise per Session: 0 min  Stress: Not on file  Social Connections: Not on file     Family History: The patient's family history includes Arthritis in her father; Cancer in her father; Cancer (age of onset: 84) in her child; Cancer (age of onset: 87) in her sister; Hypertension in her mother.  ROS:   Please see the history of present illness.    All other systems reviewed and are negative.  EKGs/Labs/Other Studies Reviewed:    The following studies were reviewed today:   EKG:   09/20/21: Normal sinus rhythm, rate 68, right atrial enlargement, right axis deviation, Q waves in V1/2, T wave inversions in leads III, aVF 05/15/2022: Normal sinus rhythm, rate 81, left axis deviation, poor R wave progression  Recent Labs: 05/15/2022: BUN 21; Creatinine, Ser  0.66; Hemoglobin 15.6; Platelets 212; Potassium 4.6; Sodium 144  Recent Lipid Panel    Component Value Date/Time   CHOL 119 07/08/2021 1408   TRIG 124 07/08/2021 1408   HDL 52 07/08/2021 1408   CHOLHDL 2.3 07/08/2021 1408   LDLCALC 45 07/08/2021 1408    Physical Exam:    VS:  BP 106/66   Pulse 77   Ht 5' (1.524 m)   Wt 130 lb 9.6 oz (59.2 kg)   SpO2 96%   BMI 25.51 kg/m     Wt Readings from Last 3 Encounters:  09/21/22 130 lb 9.6 oz (59.2 kg)  05/16/22 128 lb (58.1 kg)  05/15/22 130 lb 3.2 oz (59.1 kg)     GEN: Well nourished, well developed in no acute distress HEENT: Normal NECK: No JVD; No carotid bruits LYMPHATICS: scattered crackles at left base CARDIAC: RRR, no murmurs, rubs, gallops RESPIRATORY:  Clear to auscultation without rales, wheezing or rhonchi  ABDOMEN: Soft, non-tender, non-distended MUSCULOSKELETAL:  No edema; No deformity  SKIN: Warm and dry NEUROLOGIC:  Alert and oriented x 3 PSYCHIATRIC:  Normal affect   ASSESSMENT:    1. Chronic combined systolic (congestive) and diastolic (congestive) heart failure ([place])   2. Essential hypertension   3. Tobacco use   4. PAD (peripheral artery disease) (HCC)       PLAN:    Chronic combined systolic and diastolic heart failure: Echocardiogram on 12/16/20 showed EF 25 to 76%, grade 1 diastolic dysfunction, normal RV function, severe left atrial dilatation, moderate MR. RHC/LHC on 12/17/2020 showed minimal nonobstructive CAD, RA 4, RV 22/3, PA 32/11/22, PW 16, LVEDP 24.  Cardiac MRI on 02/28/2021 showed moderate LV dilatation with moderate systolic dysfunction (EF 72%), normal RV size and systolic function (EF 09%), RV insertion site LGE. Echocardiogram 08/30/2021 showed EF 35 to 40%, global hypokinesis, grade 1 diastolic dysfunction, normal RV function, mild AI.  Echocardiogram 09/11/22 showed EF 60-65%, mild  LVH, normal RV function, mild LAE. -Continue losartan 100 mg daily -Continue Imdur 30 mg daily/hydralazine  25 mg 3 times daily -Continue spironolactone 25 mg daily.  Will check BMP.   Delene Loll and [place] are cost prohibitive -Continue Toprol-XL 50 mg daily.  This was not started during her hospitalization due to reported intermittent bradycardia.  Zio patch x3 days on 01/26/2021 showed one 6 beat episode of NSVT, occasional PVCs (1.1%), no issues with bradycardia, average heart rate 76 bpm.  She has been tolerating Toprol-XL without any issues.  Hypertension: Continue losartan, Imdur/hydralazine, spironolactone, Toprol-XL as above.    Tobacco use: Counseled on the risk of tobacco use and cessation strongly recommended  PAD: ABIs any 2922 showed moderate disease (0.75) on left, normal (1.10) on right.  Ultrasound showed 75 to 99% stenosis in the left TPT, along with 30 to 49% stenosis in the R proximal CFA, 50 to 74% stenosis in the R mid SFA. -Continue aspirin 81 mg daily -Continue lovastatin 40 mg daily, LDL 41 on 08/08/2022 -Smoking cessation strongly recommended -Regular walking program recommended -Seen by Dr Fletcher Anon, recommend holding off on intervention unless worsening symptoms.     RTC in 6 months   Medication Adjustments/Labs and Tests Ordered: Current medicines are reviewed at length with the patient today.  Concerns regarding medicines are outlined above.  Orders Placed This Encounter  Procedures   Basic metabolic panel   Magnesium     No orders of the defined types were placed in this encounter.    Patient Instructions  Medication Instructions:  Your physician recommends that you continue on your current medications as directed. Please refer to the Current Medication list given to you today.  *If you need a refill on your cardiac medications before your next appointment, please call your pharmacy*  Lab Work: BMET, [place] today  If you have labs (blood work) drawn today and your tests are completely normal, you will receive your results only by: [place] (if you  have MyChart) OR A paper copy in the mail If you have any lab test that is abnormal or we need to change your treatment, we will call you to review the results.  Follow-Up: At [facility], you and your health needs are our priority.  As part of our continuing mission to provide you with exceptional heart care, we have created designated Provider Care Teams.  These Care Teams include your primary Cardiologist (physician) and Advanced Practice Providers (APPs -  Physician Assistants and Nurse Practitioners) who all work together to provide you with the care you need, when you need it.  We recommend signing up for the patient portal called "MyChart".  Sign up information is provided on this After Visit Summary.  MyChart is used to connect with patients for Virtual Visits (Telemedicine).  Patients are able to view lab/test results, encounter notes, upcoming appointments, etc.  Non-urgent messages can be sent to your provider as well.   To learn more about what you can do with MyChart, go to [email or web address].    Your next appointment:   6 month(s)  The format for your next appointment:   In Person  Provider:   Donato Heinz, MD            Signed, Donato Heinz, MD  09/21/2022 3:12 PM    [place]

## 2022-09-22 LAB — BASIC METABOLIC PANEL
BUN/Creatinine Ratio: 27 (ref 12–28)
BUN: 20 mg/dL (ref 8–27)
CO2: 24 mmol/L (ref 20–29)
Calcium: 9.9 mg/dL (ref 8.7–10.3)
Chloride: 104 mmol/L (ref 96–106)
Creatinine, Ser: 0.74 mg/dL (ref 0.57–1.00)
Glucose: 94 mg/dL (ref 70–99)
Potassium: 4 mmol/L (ref 3.5–5.2)
Sodium: 143 mmol/L (ref 134–144)
eGFR: 79 mL/min/{1.73_m2} (ref >59)

## 2022-09-22 LAB — MAGNESIUM: Magnesium: 2 mg/dL (ref 1.6–2.3)

## 2022-09-28 ENCOUNTER — Encounter: Payer: Self-pay | Admitting: *Deleted

## 2022-10-24 ENCOUNTER — Telehealth (INDEPENDENT_AMBULATORY_CARE_PROVIDER_SITE_OTHER): Payer: Medicare Other | Admitting: Physician Assistant

## 2022-10-24 DIAGNOSIS — F02A0 Dementia in other diseases classified elsewhere, mild, without behavioral disturbance, psychotic disturbance, mood disturbance, and anxiety: Secondary | ICD-10-CM

## 2022-10-24 DIAGNOSIS — G309 Alzheimer's disease, unspecified: Secondary | ICD-10-CM | POA: Diagnosis not present

## 2022-10-24 NOTE — Progress Notes (Signed)
Virtual Visit via Video Note The purpose of this virtual visit is to provide medical care in a patient that is unable to be seen in person due to physical or health limitations   Consent was obtained for video visit:  yes  Answered questions that patient had about telehealth interaction:  yes I discussed the limitations, risks, security and privacy concerns of performing an evaluation and management service by telemedicine. I also discussed with the patient that there may be a patient responsible charge related to this service. The patient expressed understanding and agreed to proceed.  Pt location: Home Physician Location: office Name of referring provider:  Marda Stalker, PA-C I connected with [name] at patients initiation/request on 10/24/2022 at  9:30 AM EST by video enabled telemedicine application and verified that I am speaking with the correct person using two identifiers. Pt MRN:  [id number] Pt DOB:  August 25, 1934 Video Participants:  [name];  daughter    Assessment and Plan:    Mild Major Neurocognitive Disorder likely due to Alzheimer's Disease   [name] is a very pleasant 89 y.o. RH female with a history of hypertension, hyperlipidemia, squamous cell CA s/p radiation tx, COPD, CHF seen today in follow up for memory loss. She was last seen on 04/21/22 at which time her MMSE was 24/30.  Patient is currently on memantine 5 mg daily . She is stable from the cognitive standpoint.      Follow up in 6  months. Continue Memantine 5 mg daily. Side effects were discussed  Continue to control mood as per PCP Recommend good control of cardiovascular risk factors.       Subjective:    This patient is accompanied at home by her daughter  who supplements the history.  Previous records as well as any outside records available were reviewed prior to todays visit. Patient was last seen on 04/21/22 at which time her MMSE was 24/30    Any changes in memory  since last visit?  Her daughter and the patient report that "memory is about the same" . She enjoys reading newspapers and jigsaw puzzles   repeats oneself?  Denies   Disoriented when walking into a room?  Patient denies  Leaving objects in unusual places?  denies   Wandering behavior?  denies   Any personality changes since last visit?  denies . She reports being very happy, has a new great grandchild since Nov 2023   Any worsening depression?:  denies   Hallucinations or paranoia?  denies   Seizures?    denies    Any sleep changes?  Denies vivid dreams, REM behavior or sleepwalking   Sleep apnea?   denies   Any hygiene concerns?    denies   Independent of bathing and dressing?  Endorsed  Does the patient needs help with medications? [facility] is in charge   Who is in charge of the finances?  Son is in charge     Any changes in appetite?  denies     Patient have trouble swallowing?  denies   Does the patient cook?  Any kitchen accidents such as leaving the stove on? Patient denies   Any headaches?   denies   Chronic back pain  denies   Ambulates with difficulty?  Uses a walker and cane for safety   Recent falls or head injuries? denies     Unilateral weakness, numbness or tingling?    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  Any tremors?  denies   Any anosmia?  Patient denies   Any incontinence of urine?  denies   Any bowel dysfunction?     denies      Patient lives   at [facility] Does the patient drive? No longer drives  Initial Evaluation 04/19/21 This is an 89 year old right-handed woman with a history of hypertension, hyperlipidemia, squamous cell CA s/p radiation tx, COPD, CHF, presenting for evaluation of memory loss. She feels her memory "ain't too good." [name] was noticing slight changes but memory changes became much more noticeable after her hospitalization 4 months ago for CHF and significant hypertension. Since then, memory changes were "all the time,  daily." She would repeat herself, asking the same questions repeatedly. She is slower to remember things. She would misplace her credit card and checkbook, saying she knows [name] took it. [name] lives next door and has been fixing her pillbox even before the hospitalization. She was previously taking her own medications, but since hospitalization has aides staying from 7am to 11pm to remind her to take them. [name] took over finances after hospitalization and noticed she had double paid quite a few bills in the past 9 months. She stopped driving 3-5 years ago due to vision loss in the right eye. Around the time she was hospitalized, she forgot she was cooking food on the stove. She was previously independent with dressing and bathing, now aides are on standby and make sure she takes a shower. She states she is able to prepare her own food using the microwave. No paranoia or hallucinations, but [name] feels she is a little more depressed than before. She states mood is "just flat," she gets frustrated sometimes. Sleep is much better, no wandering behaviors.    She denies any headaches, dizziness, diplopia, dysarthria, dysphagia, neck/back pain, focal numbness/tingling/weakness, bowel/bladder dysfunction. No anosmia, tremors, no falls. She always ambulates with a cane for balance. No family history of dementia, no history of significant head injuries or alcohol use.   MRI brain 05/07/21 : No acute intracranial abnormality.2. Findings of chronic ischemic microangiopathy and generalized volume loss without a clear lobar predilection.       Current Outpatient Medications on File Prior to Visit  Medication Sig Dispense Refill   alendronate (FOSAMAX) 70 MG tablet Take 70 mg by mouth once a week.     aspirin EC 81 MG EC tablet Take 1 tablet (81 mg total) by mouth at bedtime. Swallow whole. 30 tablet 11   docusate sodium (COLACE) 100 MG capsule Take 100 mg by mouth daily as needed.     hydrALAZINE (APRESOLINE)  25 MG tablet Take 1 tablet (25 mg total) by mouth 3 (three) times daily. Or every 8 hours 270 tablet 3   isosorbide mononitrate (IMDUR) 30 MG 24 hr tablet Take 1 tablet (30 mg total) by mouth daily. 30 tablet 11   losartan (COZAAR) 100 MG tablet Take 1 tablet (100 mg total) by mouth daily. 90 tablet 3   lovastatin (MEVACOR) 40 MG tablet Take 1 tablet by mouth daily.     memantine (NAMENDA) 5 MG tablet Take half tablet nightly  for 1 week then increase to  1 tablet every night 30 tablet 11   metoprolol succinate (TOPROL-XL) 50 MG 24 hr tablet Take 1 tablet (50 mg total) by mouth daily. Take with or immediately following a meal. 90 tablet 3   Multiple Vitamins-Minerals (PRESERVISION AREDS 2)  CAPS Take 1 capsule by mouth 2 (two) times daily.     spironolactone (ALDACTONE) 25 MG tablet Take 1 tablet (25 mg total) by mouth daily. 90 tablet 3   venlafaxine XR (EFFEXOR-XR) 150 MG 24 hr capsule Take 150 mg by mouth daily.     vitamin B-12 100 MCG tablet Take 1 tablet (100 mcg total) by mouth daily. 30 tablet 0   No current facility-administered medications on file prior to visit.      04/24/2022    9:00 PM  MMSE - Mini Mental State Exam  Orientation to time 2  Orientation to Place 5  Registration 2  Attention/ Calculation 5  Recall 2  Language- name 2 objects 2  Language- repeat 1  Language- follow 3 step command 3  Language- read & follow direction 1  Write a sentence 1  Copy design 0  Total score 24        04/19/2021    9:00 AM  Montreal Cognitive Assessment   Visuospatial/ Executive (0/5) 1  Naming (0/3) 3  Attention: Read list of digits (0/2) 2  Attention: Read list of letters (0/1) 0  Attention: Serial 7 subtraction starting at 100 (0/3) 2  Language: Repeat phrase (0/2) 2  Language : Fluency (0/1) 0  Abstraction (0/2) 0  Delayed Recall (0/5) 0  Orientation (0/6) 5  Total 15  Adjusted Score (based on education) 16    Observations/Objective:   There were no vitals filed for  this visit. GEN:  The patient appears stated age and is in NAD.  Neurological examination: Patient is awake, alert, oriented x 3. No aphasia or dysarthria. Intact fluency and comprehension. Remote and recent memory intact. Able to name and repeat. Cranial nerves: Extraocular movements intact with no nystagmus. No facial asymmetry. Motor: moves all extremities symmetrically, at least anti-gravity x 4. No incoordination on finger to nose testing. Gait: narrow-based and steady, able to tandem walk adequately needs a walker to ambulate .       Follow Up Instructions:    -I discussed the assessment and treatment plan with the patient. The patient was provided an opportunity to ask questions and all were answered. The patient agreed with the plan and demonstrated an understanding of the instructions.   The patient was advised to call back or seek an in-person evaluation if the symptoms worsen or if the condition fails to improve as anticipated.    Total time spent on today's visit was 15:46 minutes, including both face-to-face time and nonface-to-face time.  Time included that spent on review of records (prior notes available to me/labs/imaging if pertinent), discussing treatment and goals, answering patient's questions and coordinating care.   Sharene Butters, PA-C

## 2022-10-24 NOTE — Patient Instructions (Signed)
It was a pleasure to see you today at our office.   Recommendations:  Follow up in  6 months virtual due to mobility issues and transportation difficulties  Continue Continue Memantine 5 mg twice daily.   Whom to call:  Memory  decline, memory medications: Call our office [phone]   For psychiatric meds, mood meds: Please have your primary care physician manage these medications.   Counseling regarding caregiver distress, including caregiver depression, anxiety and issues regarding community resources, adult day care programs, adult living facilities, or memory care questions:   Feel free to contact [place], Social Worker at [phone]   For assessment of decision of mental capacity and competency:  Call Dr. Anthoney Harada, geriatric psychiatrist at [phone]  For guidance in geriatric dementia issues please call [facility] [phone]  For guidance regarding [facility] and if placement were needed at the facility, contact Arnell Asal, Social Worker tel: [phone]  If you have any severe symptoms of a stroke, or other severe issues such as confusion,severe chills or fever, etc call 911 or go to the ER as you may need to be evaluated further   Feel free to visit Facebook page " [employer]" for tips of how to care for people with memory problems.   Feel free to go to the following database for funded clinical studies conducted around the world: http://saunders.com/   https://www.triadclinicaltrials.com/     RECOMMENDATIONS FOR ALL PATIENTS WITH MEMORY PROBLEMS: 1. Continue to exercise (Recommend 30 minutes of walking everyday, or 3 hours every week) 2. Increase social interactions - continue going to [facility] and enjoy social gatherings with friends and family 3. Eat healthy, avoid fried foods and eat more fruits and vegetables 4. Maintain adequate blood pressure, blood sugar, and blood cholesterol level. Reducing the  risk of stroke and cardiovascular disease also helps promoting better memory. 5. Avoid stressful situations. Live a simple life and avoid aggravations. Organize your time and prepare for the next day in anticipation. 6. Sleep well, avoid any interruptions of sleep and avoid any distractions in the bedroom that may interfere with adequate sleep quality 7. Avoid sugar, avoid sweets as there is a strong link between excessive sugar intake, diabetes, and cognitive impairment We discussed the Mediterranean diet, which has been shown to help patients reduce the risk of progressive memory disorders and reduces cardiovascular risk. This includes eating fish, eat fruits and green leafy vegetables, nuts like almonds and hazelnuts, walnuts, and also use olive oil. Avoid fast foods and fried foods as much as possible. Avoid sweets and sugar as sugar use has been linked to worsening of memory function.  There is always a concern of gradual progression of memory problems. If this is the case, then we may need to adjust level of care according to patient needs. Support, both to the patient and caregiver, should then be put into place.    The Alzheimer's Association is here all day, every day for people facing Alzheimer's disease through our free 24/7 Helpline: [phone]) [phone]. The Helpline provides reliable information and support to all those who need assistance, such as individuals living with memory loss, Alzheimer's or other dementia, caregivers, health care professionals and the public.  Our highly trained and knowledgeable staff can help you with: Understanding memory loss, dementia and Alzheimer's  Medications and other treatment options  General information about aging and brain health  Skills to provide quality care and to find the best care from professionals  Legal, financial  and living-arrangement decisions Our Helpline also features: Confidential care consultation provided by master's level clinicians  who can help with decision-making support, crisis assistance and education on issues families face every day  Help in a caller's preferred language using our translation service that features more than 200 languages and dialects  Referrals to local community programs, services and ongoing support     FALL PRECAUTIONS: Be cautious when walking. Scan the area for obstacles that may increase the risk of trips and falls. When getting up in the mornings, sit up at the edge of the bed for a few minutes before getting out of bed. Consider elevating the bed at the head end to avoid drop of blood pressure when getting up. Walk always in a well-lit room (use night lights in the walls). Avoid area rugs or power cords from appliances in the middle of the walkways. Use a walker or a cane if necessary and consider physical therapy for balance exercise. Get your eyesight checked regularly.  FINANCIAL OVERSIGHT: Supervision, especially oversight when making financial decisions or transactions is also recommended.  HOME SAFETY: Consider the safety of the kitchen when operating appliances like stoves, microwave oven, and blender. Consider having supervision and share cooking responsibilities until no longer able to participate in those. Accidents with firearms and other hazards in the house should be identified and addressed as well.   ABILITY TO BE LEFT ALONE: If patient is unable to contact 911 operator, consider using LifeLine, or when the need is there, arrange for someone to stay with patients. Smoking is a fire hazard, consider supervision or cessation. Risk of wandering should be assessed by caregiver and if detected at any point, supervision and safe proof recommendations should be instituted.  MEDICATION SUPERVISION: Inability to self-administer medication needs to be constantly addressed. Implement a mechanism to ensure safe administration of the medications.   DRIVING: Regarding driving, in patients with  progressive memory problems, driving will be impaired. We advise to have someone else do the driving if trouble finding directions or if minor accidents are reported. Independent driving assessment is available to determine safety of driving.   If you are interested in the driving assessment, you can contact the following:  [employer] in [place]  [place] [phone] or [phone]      [place]ts refers to food and lifestyle choices that are based on the traditions of countries located on the [employer]. This way of eating has been shown to help prevent certain conditions and improve outcomes for people who have chronic diseases, like kidney disease and heart disease. What are tips for following this plan? Lifestyle  Cook and eat meals together with your family, when possible. Drink enough fluid to keep your urine clear or pale yellow. Be physically active every day. This includes: Aerobic exercise like running or swimming. Leisure activities like gardening, walking, or housework. Get 7-8 hours of sleep each night. If recommended by your health care provider, drink red wine in moderation. This means 1 glass a day for nonpregnant women and 2 glasses a day for men. A glass of wine equals 5 oz (150 mL). Reading food labels  Check the serving size of packaged foods. For foods such as rice and pasta, the serving size refers to the amount of cooked product, not dry. Check the total fat in packaged foods. Avoid foods that have saturated fat or trans fats. Check  the ingredients list for added sugars, such as corn syrup. Shopping  At the grocery store, buy most of your food from the areas near the walls of the store. This includes: Fresh fruits and vegetables (produce). Grains, beans, nuts, and seeds. Some of these may be available in  unpackaged forms or large amounts (in bulk). Fresh seafood. Poultry and eggs. Low-fat dairy products. Buy whole ingredients instead of prepackaged foods. Buy fresh fruits and vegetables in-season from local farmers markets. Buy frozen fruits and vegetables in resealable bags. If you do not have access to quality fresh seafood, buy precooked frozen shrimp or canned fish, such as tuna, salmon, or sardines. Buy small amounts of raw or cooked vegetables, salads, or olives from the deli or salad bar at your store. Stock your pantry so you always have certain foods on hand, such as olive oil, canned tuna, canned tomatoes, rice, pasta, and beans. Cooking  Cook foods with extra-virgin olive oil instead of using butter or other vegetable oils. Have meat as a side dish, and have vegetables or grains as your main dish. This means having meat in small portions or adding small amounts of meat to foods like pasta or stew. Use beans or vegetables instead of meat in common dishes like chili or lasagna. Experiment with different cooking methods. Try roasting or broiling vegetables instead of steaming or sauteing them. Add frozen vegetables to soups, stews, pasta, or rice. Add nuts or seeds for added healthy fat at each meal. You can add these to yogurt, salads, or vegetable dishes. Marinate fish or vegetables using olive oil, lemon juice, garlic, and fresh herbs. Meal planning  Plan to eat 1 vegetarian meal one day each week. Try to work up to 2 vegetarian meals, if possible. Eat seafood 2 or more times a week. Have healthy snacks readily available, such as: Vegetable sticks with hummus. Greek yogurt. Fruit and nut trail mix. Eat balanced meals throughout the week. This includes: Fruit: 2-3 servings a day Vegetables: 4-5 servings a day Low-fat dairy: 2 servings a day Fish, poultry, or lean meat: 1 serving a day Beans and legumes: 2 or more servings a week Nuts and seeds: 1-2 servings a day Whole  grains: 6-8 servings a day Extra-virgin olive oil: 3-4 servings a day Limit red meat and sweets to only a few servings a month What are my food choices? Mediterranean diet Recommended Grains: Whole-grain pasta. Brown rice. Bulgar wheat. Polenta. Couscous. Whole-wheat bread. [place]. Vegetables: Artichokes. Beets. Broccoli. Cabbage. Carrots. Eggplant. Green beans. Chard. Kale. Spinach. Onions. Leeks. Peas. Squash. Tomatoes. Peppers. Radishes. Fruits: Apples. Apricots. Avocado. Berries. Bananas. Cherries. Dates. Figs. Grapes. Lemons. Melon. Oranges. Peaches. Plums. Pomegranate. Meats and other protein foods: Beans. Almonds. Sunflower seeds. Pine nuts. Peanuts. [place]. Salmon. Scallops. Shrimp. [place]. Tilapia. Clams. Oysters. Eggs. Dairy: Low-fat milk. Cheese. Greek yogurt. Beverages: Water. Red wine. Herbal tea. Fats and oils: Extra virgin olive oil. Avocado oil. Grape seed oil. Sweets and desserts: [place] yogurt with honey. Baked apples. Poached pears. Trail mix. Seasoning and other foods: Basil. Cilantro. Coriander. Cumin. Mint. Parsley. Sage. Rosemary. Tarragon. Garlic. Oregano. Thyme. Pepper. Balsalmic vinegar. Tahini. Hummus. Tomato sauce. Olives. Mushrooms. Limit these Grains: Prepackaged pasta or rice dishes. Prepackaged cereal with added sugar. Vegetables: Deep fried potatoes (french fries). Fruits: Fruit canned in syrup. Meats and other protein foods: Beef. Pork. Lamb. Poultry with skin. Hot dogs. Berniece Salines. Dairy: Ice cream. Sour cream. Whole milk. Beverages: Juice. Sugar-sweetened soft drinks. Beer. Liquor and spirits. Fats and oils: Butter.  Canola oil. Vegetable oil. Beef fat (tallow). Lard. Sweets and desserts: Cookies. Cakes. Pies. Candy. Seasoning and other foods: Mayonnaise. Premade sauces and marinades. The items listed may not be a complete list. Talk with your dietitian about what dietary choices are right for you. Summary The Mediterranean diet includes both food and  lifestyle choices. Eat a variety of fresh fruits and vegetables, beans, nuts, seeds, and whole grains. Limit the amount of red meat and sweets that you eat. Talk with your health care provider about whether it is safe for you to drink red wine in moderation. This means 1 glass a day for nonpregnant women and 2 glasses a day for men. A glass of wine equals 5 oz (150 mL). This information is not intended to replace advice given to you by your health care provider. Make sure you discuss any questions you have with your health care provider. Document Released: 06/01/2016 Document Revised: 07/04/2016 Document Reviewed: 06/01/2016 Elsevier Interactive Patient Education  2017 [employer].

## 2023-03-22 ENCOUNTER — Encounter: Payer: Self-pay | Admitting: Cardiology

## 2023-03-22 ENCOUNTER — Ambulatory Visit: Payer: Medicare Other | Attending: Cardiology | Admitting: Cardiology

## 2023-03-22 VITALS — BP 112/62 | HR 84 | Ht 59.0 in | Wt 128.6 lb

## 2023-03-22 DIAGNOSIS — E785 Hyperlipidemia, unspecified: Secondary | ICD-10-CM

## 2023-03-22 DIAGNOSIS — I5042 Chronic combined systolic (congestive) and diastolic (congestive) heart failure: Secondary | ICD-10-CM | POA: Diagnosis not present

## 2023-03-22 DIAGNOSIS — I10 Essential (primary) hypertension: Secondary | ICD-10-CM

## 2023-03-22 DIAGNOSIS — I739 Peripheral vascular disease, unspecified: Secondary | ICD-10-CM | POA: Diagnosis not present

## 2023-03-22 DIAGNOSIS — Z720 Tobacco use: Secondary | ICD-10-CM

## 2023-03-22 NOTE — Progress Notes (Signed)
Cardiology Office Note:    Date:  03/22/2023   ID:  [name], DOB [birth year], MRN [id number]  PCP:  Jarrett Soho, PA-C  Cardiologist:  Little Ishikawa, MD  Electrophysiologist:  None   Referring MD: Jarrett Soho, PA-C   Chief Complaint  Patient presents with   Congestive Heart Failure      History of Present Illness:    [name] is a 89 y.o. female with a hx of chronic combined systolic and diastolic heart failure hypertension, COPD who presents for hospital follow-up.  She was admitted to [facility] from 2/23 through 12/27/2020.  Had presented with shortness of breath with significantly elevated BP, and found to be significantly volume overloaded.  Echocardiogram on 12/16/20 showed EF 25 to 30%, grade 1 diastolic dysfunction, normal RV function, severe left atrial dilatation, moderate MR. RHC/LHC on 12/17/2020 showed minimal nonobstructive CAD, RA 4, RV 22/3, PA 32/11/22, PW 16, LVEDP 24.  Discharged on hydralazine 10 mg 3 times daily, Imdur 30 mg daily, losartan 25 mg daily, Aldactone 12.5 mg daily.  She developed AKI due to IV diuresis, was not discharged on diuresis.  Cardiac MRI on 02/28/2021 showed moderate LV dilatation with moderate systolic dysfunction (EF 30%), normal RV size and systolic function (EF 51%), RV insertion site LGE.  Zio patch x3 days on 01/26/2021 showed one 6 beat episode of NSVT, occasional PVCs (1.1%).  Echocardiogram 08/30/2021 showed EF 35 to 40%, global hypokinesis, grade 1 diastolic dysfunction, normal RV function, mild AI.  Echocardiogram 09/11/22 showed EF 60-65%, mild LVH, normal RV function, mild LAE.  Since last clinic visit, she reports that she is doing well.  Denies any chest pain, dyspnea, lightheadedness, syncope, lower extremity edema, or palpitations.  Continues to smoke, about 5 cigarettes/day.   Wt Readings from Last 3 Encounters:  03/22/23 128 lb 9.6 oz (58.3 kg)  09/21/22 130 lb 9.6 oz (59.2 kg)  05/16/22 128 lb (58.1 kg)      Past Medical History:  Diagnosis Date   Acute combined systolic and diastolic CHF, NYHA class 4 (HCC)    Anxiety    Arthritis    knees, hands    Cancer (HCC)    SQUAMOuS CELL CARCINOMA OF SKIN- PERIANAL    COPD (chronic obstructive pulmonary disease) (HCC)    smoker   Depression    Elbow fracture, right 2013   Hyperlipidemia    Hypertension    Osteoporosis    Pneumonia    hx of    S/P radiation therapy 04/08/2014-05/14/2014   Right perianal skin / 45 Gy in 25 fractions   Wears glasses     Past Surgical History:  Procedure Laterality Date   ABDOMINAL HYSTERECTOMY     TOTAL VAGINAL HYSTERECTOMY   APPENDECTOMY     CHOLECYSTECTOMY     ELBOW FRACTURE SURGERY  2013   rt   EVALUATION UNDER ANESTHESIA WITH HEMORRHOIDECTOMY N/A 05/29/2016   Procedure: EXAM UNDER ANESTHESIA WITH INTERNAL AND EXTERNAL HEMORRHOIDECTOMY;  Surgeon: Violeta Gelinas, MD;  Location: [facility];  Service: General;  Laterality: N/A;   EYE SURGERY     left eye cataract surgery    HEMORRHOID SURGERY N/A 02/06/2014   Procedure: EXCISION PERIANAL MASS;  Surgeon: Liz Malady, MD;  Location: [facility];  Service: General;  Laterality: N/A;   HERNIA REPAIR     left inguinal hernia repair    I & D EXTREMITY Left 03/05/2013   Procedure: Irrigation and Debridement Olecranon Bursa/Left  Elbow;  Surgeon: Sharma Covert, MD;  Location[facility] OR;  Service: Orthopedics;  Laterality: Left;   JOINT REPLACEMENT     right and left hip replacement    MASS EXCISION N/A 05/24/2015   Procedure: EXCISION ANAL MASS X 2;  Surgeon: Violeta Gelinas, MD;  Location: [facility];  Service: General;  Laterality: N/A;   OTHER SURGICAL HISTORY     arthroscopic right knee    OTHER SURGICAL HISTORY     right carpal tunnel   OTHER SURGICAL HISTORY     right elbow surgery    OTHER SURGICAL HISTORY     oophorectomy 1958   RECTAL BIOPSY N/A 05/29/2016   Procedure: ANAL BIOPSY;  Surgeon: Violeta Gelinas, MD;  Location:  SURGERY CENTER;  Service: General;  Laterality: N/A;   RIGHT/LEFT HEART CATH AND CORONARY ANGIOGRAPHY N/A 12/17/2020   Procedure: RIGHT/LEFT HEART CATH AND CORONARY ANGIOGRAPHY;  Surgeon: Swazil[place], Peter M, MD;  Location: [facility] INVASIVE CV LAB;  Service: Cardiovascular;  Laterality: N/A;   TOTAL KNEE ARTHROPLASTY  12/13/2011   Procedure: TOTAL KNEE ARTHROPLASTY;  Surgeon: Jacki Cones, MD;  Location: [facility] ORS;  Service: Orthopedics;  Laterality: Right;    Current Medications: Current Meds  Medication Sig   alendronate (FOSAMAX) 70 MG tablet Take 70 mg by mouth once a week.   aspirin EC 81 MG EC tablet Take 1 tablet (81 mg total) by mouth at bedtime. Swallow whole.   docusate sodium (COLACE) 100 MG capsule Take 100 mg by mouth daily as needed.   hydrALAZINE (APRESOLINE) 25 MG tablet Take 1 tablet (25 mg total) by mouth 3 (three) times daily. Or every 8 hours   isosorbide mononitrate (IMDUR) 30 MG 24 hr tablet Take 1 tablet (30 mg total) by mouth daily.   lovastatin (MEVACOR) 40 MG tablet Take 1 tablet by mouth daily.   memantine (NAMENDA) 5 MG tablet Take half tablet nightly  for 1 week then increase to  1 tablet every night   metoprolol succinate (TOPROL-XL) 50 MG 24 hr tablet Take 1 tablet (50 mg total) by mouth daily. Take with or immediately following a meal.   Multiple Vitamins-Minerals (PRESERVISION AREDS 2) CAPS Take 1 capsule by mouth 2 (two) times daily.   spironolactone (ALDACTONE) 25 MG tablet Take 1 tablet (25 mg total) by mouth daily.   venlafaxine XR (EFFEXOR-XR) 150 MG 24 hr capsule Take 150 mg by mouth daily.   vitamin B-12 100 MCG tablet Take 1 tablet (100 mcg total) by mouth daily.     Allergies:   Other and Prolia [denosumab]   Social History   Socioeconomic History   Marital status: Widowed    Spouse name: Not on file   Number of children: Not on file   Years of education: Not on file   Highest education level: Not on file   Occupational History   Not on file  Tobacco Use   Smoking status: Every Day    Packs/day: 0.25    Years: 50.00    Additional pack years: 0.00    Total pack years: 12.50    Types: Cigarettes    Passive exposure: Never   Smokeless tobacco: Never  Vaping Use   Vaping Use: Never used  Substance and Sexual Activity   Alcohol use: No   Drug use: No   Sexual activity: Not Currently  Other Topics Concern   Not on file  Social History Narrative   Right Handed  Lives in a one story home    Drinks Caffeine   Social Determinants of Health   Financial Resource Strain: Low Risk  (12/20/2020)   Overall Financial Resource Strain (CARDIA)    Difficulty of Paying Living Expenses: Not very hard  Food Insecurity: No Food Insecurity (12/20/2020)   Hunger Vital Sign    Worried About Running Out of Food in the Last Year: Never true    Ran Out of Food in the Last Year: Never true  Transportation Needs: No Transportation Needs (12/20/2020)   PRAPARE - Administrator, Civil Service (Medical): No    Lack of Transportation (Non-Medical): No  Physical Activity: Inactive (12/20/2020)   Exercise Vital Sign    Days of Exercise per Week: 0 days    Minutes of Exercise per Session: 0 min  Stress: Not on file  Social Connections: Not on file     Family History: The patient's family history includes Arthritis in her father; Cancer in her father; Cancer (age of onset: 25) in her child; Cancer (age of onset: 34) in her sister; Hypertension in her mother.  ROS:   Please see the history of present illness.    All other systems reviewed and are negative.  EKGs/Labs/Other Studies Reviewed:    The following studies were reviewed today:   EKG:   09/20/21: Normal sinus rhythm, rate 68, right atrial enlargement, right axis deviation, Q waves in V1/2, T wave inversions in leads III, aVF 05/15/2022: Normal sinus rhythm, rate 81, left axis deviation, poor R wave progression 03/22/2023: Normal  sinus rhythm, PVCs, Q waves in V1/2, poor R wave progression, left axis deviation  Recent Labs: 05/15/2022: Hemoglobin 15.6; Platelets 212 09/21/2022: BUN 20; Creatinine, Ser 0.74; Magnesium 2.0; Potassium 4.0; Sodium 143  Recent Lipid Panel    Component Value Date/Time   CHOL 119 07/08/2021 1408   TRIG 124 07/08/2021 1408   HDL 52 07/08/2021 1408   CHOLHDL 2.3 07/08/2021 1408   LDLCALC 45 07/08/2021 1408    Physical Exam:    VS:  BP 112/62 (BP Location: Left Arm, Patient Position: Sitting, Cuff Size: Normal)   Pulse 84   Ht 4\' 11"  (1.499 m)   Wt 128 lb 9.6 oz (58.3 kg)   SpO2 94%   BMI 25.97 kg/m     Wt Readings from Last 3 Encounters:  03/22/23 128 lb 9.6 oz (58.3 kg)  09/21/22 130 lb 9.6 oz (59.2 kg)  05/16/22 128 lb (58.1 kg)     GEN: Well nourished, well developed in no acute distress HEENT: Normal NECK: No JVD; No carotid bruits LYMPHATICS: scattered crackles at base CARDIAC: RRR, no murmurs, rubs, gallops RESPIRATORY:  Clear to auscultation without rales, wheezing or rhonchi  ABDOMEN: Soft, non-tender, non-distended MUSCULOSKELETAL:  No edema; No deformity  SKIN: Warm and dry NEUROLOGIC:  Alert and oriented x 3 PSYCHIATRIC:  Normal affect   ASSESSMENT:    1. Chronic combined systolic (congestive) and diastolic (congestive) heart failure (HCC)   2. Essential hypertension   3. Tobacco use   4. PAD (peripheral artery disease) (HCC)   5. Hyperlipidemia LDL goal <100     PLAN:    Chronic combined systolic and diastolic heart failure: Echocardiogram on 12/16/20 showed EF 25 to 30%, grade 1 diastolic dysfunction, normal RV function, severe left atrial dilatation, moderate MR. RHC/LHC on 12/17/2020 showed minimal nonobstructive CAD, RA 4, RV 22/3, PA 32/11/22, PW 16, LVEDP 24.  Cardiac MRI on 02/28/2021 showed moderate LV dilatation  with moderate systolic dysfunction (EF 30%), normal RV size and systolic function (EF 51%), RV insertion site LGE. Echocardiogram  08/30/2021 showed EF 35 to 40%, global hypokinesis, grade 1 diastolic dysfunction, normal RV function, mild AI.  Echocardiogram 09/11/22 showed EF 60-65%, mild LVH, normal RV function, mild LAE. -Continue losartan 100 mg daily -Continue Imdur 30 mg daily/hydralazine 25 mg 3 times daily -Continue spironolactone 25 mg daily.  Will check BMP.   Sherryll Burger and [place] are cost prohibitive -Continue Toprol-XL 50 mg daily.  This was not started during her hospitalization due to reported intermittent bradycardia.  Zio patch x3 days on 01/26/2021 showed one 6 beat episode of NSVT, occasional PVCs (1.1%), no issues with bradycardia, average heart rate 76 bpm.  She has been tolerating Toprol-XL without any issues.  Hypertension: Continue losartan, Imdur/hydralazine, spironolactone, Toprol-XL as above.    Tobacco use: Counseled on the risk of tobacco use and cessation strongly recommended  PAD: ABIs any 2922 showed moderate disease (0.75) on left, normal (1.10) on right.  Ultrasound showed 75 to 99% stenosis in the left TPT, along with 30 to 49% stenosis in the R proximal CFA, 50 to 74% stenosis in the R mid SFA. -Continue aspirin 81 mg daily -Continue lovastatin 40 mg daily, LDL 41 on 08/08/2022 -Smoking cessation strongly recommended -Regular walking program recommended -Seen by Dr Kirke Corin, recommend holding off on intervention unless worsening symptoms.     RTC in 6 months   Medication Adjustments/Labs and Tests Ordered: Current medicines are reviewed at length with the patient today.  Concerns regarding medicines are outlined above.  Orders Placed This Encounter  Procedures   Basic metabolic panel   CBC   EKG 12-Lead     No orders of the defined types were placed in this encounter.    Patient Instructions  Medication Instructions:  Your physician recommends that you continue on your current medications as directed. Please refer to the Current Medication list given to you today.   *If you  need a refill on your cardiac medications before your next appointment, please call your pharmacy*  Lab Work: BMET, CBC   If you have labs (blood work) drawn today and your tests are completely normal, you will receive your results only by: MyChart Message (if you have MyChart) OR A paper copy in the mail If you have any lab test that is abnormal or we need to change your treatment, we will call you to review the results.  Follow-Up: At [facility], you and your health needs are our priority.  As part of our continuing mission to provide you with exceptional heart care, we have created designated Provider Care Teams.  These Care Teams include your primary Cardiologist (physician) and Advanced Practice Providers (APPs -  Physician Assistants and Nurse Practitioners) who all work together to provide you with the care you need, when you need it.  We recommend signing up for the patient portal called "MyChart".  Sign up information is provided on this After Visit Summary.  MyChart is used to connect with patients for Virtual Visits (Telemedicine).  Patients are able to view lab/test results, encounter notes, upcoming appointments, etc.  Non-urgent messages can be sent to your provider as well.   To learn more about what you can do with MyChart, go to [email or web address].    Your next appointment:   6 month(s)  Provider:   Little Ishikawa, MD         Signed, Little Ishikawa, MD  03/22/2023 5:09 PM     [facility]

## 2023-03-22 NOTE — Patient Instructions (Signed)
Medication Instructions:  Your physician recommends that you continue on your current medications as directed. Please refer to the Current Medication list given to you today.   *If you need a refill on your cardiac medications before your next appointment, please call your pharmacy*  Lab Work: BMET, CBC   If you have labs (blood work) drawn today and your tests are completely normal, you will receive your results only by: MyChart Message (if you have MyChart) OR A paper copy in the mail If you have any lab test that is abnormal or we need to change your treatment, we will call you to review the results.  Follow-Up: At [facility], you and your health needs are our priority.  As part of our continuing mission to provide you with exceptional heart care, we have created designated Provider Care Teams.  These Care Teams include your primary Cardiologist (physician) and Advanced Practice Providers (APPs -  Physician Assistants and Nurse Practitioners) who all work together to provide you with the care you need, when you need it.  We recommend signing up for the patient portal called "MyChart".  Sign up information is provided on this After Visit Summary.  MyChart is used to connect with patients for Virtual Visits (Telemedicine).  Patients are able to view lab/test results, encounter notes, upcoming appointments, etc.  Non-urgent messages can be sent to your provider as well.   To learn more about what you can do with MyChart, go to [email or web address].    Your next appointment:   6 month(s)  Provider:   Little Ishikawa, MD

## 2023-03-23 LAB — CBC
Hematocrit: 38.9 % (ref 34.0–46.6)
Hemoglobin: 12.2 g/dL (ref 11.1–15.9)
MCH: 27.1 pg (ref 26.6–33.0)
MCHC: 31.4 g/dL — ABNORMAL LOW (ref 31.5–35.7)
MCV: 86 fL (ref 79–97)
Platelets: 296 10*3/uL (ref 150–450)
RBC: 4.51 x10E6/uL (ref 3.77–5.28)
RDW: 14.7 % (ref 11.7–15.4)
WBC: 10.2 10*3/uL (ref 3.4–10.8)

## 2023-03-23 LAB — BASIC METABOLIC PANEL
BUN/Creatinine Ratio: 23 (ref 12–28)
BUN: 20 mg/dL (ref 8–27)
CO2: 23 mmol/L (ref 20–29)
Calcium: 10.3 mg/dL (ref 8.7–10.3)
Chloride: 102 mmol/L (ref 96–106)
Creatinine, Ser: 0.86 mg/dL (ref 0.57–1.00)
Glucose: 120 mg/dL — ABNORMAL HIGH (ref 70–99)
Potassium: 4.5 mmol/L (ref 3.5–5.2)
Sodium: 140 mmol/L (ref 134–144)
eGFR: 66 mL/min/{1.73_m2} (ref >59)

## 2023-04-11 ENCOUNTER — Encounter (HOSPITAL_BASED_OUTPATIENT_CLINIC_OR_DEPARTMENT_OTHER): Payer: Self-pay

## 2023-04-11 ENCOUNTER — Other Ambulatory Visit: Payer: Self-pay

## 2023-04-11 ENCOUNTER — Emergency Department (HOSPITAL_BASED_OUTPATIENT_CLINIC_OR_DEPARTMENT_OTHER): Payer: Medicare Other | Admitting: Radiology

## 2023-04-11 ENCOUNTER — Emergency Department (HOSPITAL_BASED_OUTPATIENT_CLINIC_OR_DEPARTMENT_OTHER): Payer: Medicare Other

## 2023-04-11 ENCOUNTER — Emergency Department (HOSPITAL_BASED_OUTPATIENT_CLINIC_OR_DEPARTMENT_OTHER)
Admission: EM | Admit: 2023-04-11 | Discharge: 2023-04-11 | Disposition: A | Discharge status: Home / Self Care | Payer: Medicare Other | Attending: Emergency Medicine | Admitting: Emergency Medicine

## 2023-04-11 DIAGNOSIS — M25552 Pain in left hip: Secondary | ICD-10-CM | POA: Diagnosis present

## 2023-04-11 DIAGNOSIS — Y92002 Bathroom of unspecified non-institutional (private) residence single-family (private) house as the place of occurrence of the external cause: Secondary | ICD-10-CM | POA: Insufficient documentation

## 2023-04-11 DIAGNOSIS — J449 Chronic obstructive pulmonary disease, unspecified: Secondary | ICD-10-CM | POA: Diagnosis not present

## 2023-04-11 DIAGNOSIS — Z85828 Personal history of other malignant neoplasm of skin: Secondary | ICD-10-CM | POA: Diagnosis not present

## 2023-04-11 DIAGNOSIS — I10 Essential (primary) hypertension: Secondary | ICD-10-CM | POA: Diagnosis not present

## 2023-04-11 DIAGNOSIS — W010XXA Fall on same level from slipping, tripping and stumbling without subsequent striking against object, initial encounter: Secondary | ICD-10-CM | POA: Insufficient documentation

## 2023-04-11 DIAGNOSIS — Z79899 Other long term (current) drug therapy: Secondary | ICD-10-CM | POA: Insufficient documentation

## 2023-04-11 DIAGNOSIS — Z7982 Long term (current) use of aspirin: Secondary | ICD-10-CM | POA: Insufficient documentation

## 2023-04-11 DIAGNOSIS — Z96649 Presence of unspecified artificial hip joint: Secondary | ICD-10-CM

## 2023-04-11 DIAGNOSIS — M9702XA Periprosthetic fracture around internal prosthetic left hip joint, initial encounter: Secondary | ICD-10-CM | POA: Diagnosis not present

## 2023-04-11 LAB — CBC WITH DIFFERENTIAL/PLATELET
Abs Immature Granulocytes: 0.12 10*3/uL — ABNORMAL HIGH (ref 0.00–0.07)
Basophils Absolute: 0.1 10*3/uL (ref 0.0–0.1)
Basophils Relative: 1 %
Eosinophils Absolute: 0.1 10*3/uL (ref 0.0–0.5)
Eosinophils Relative: 0 %
HCT: 35.9 % — ABNORMAL LOW (ref 36.0–46.0)
Hemoglobin: 11 g/dL — ABNORMAL LOW (ref 12.0–15.0)
Immature Granulocytes: 1 %
Lymphocytes Relative: 4 %
Lymphs Abs: 0.6 10*3/uL — ABNORMAL LOW (ref 0.7–4.0)
MCH: 26.3 pg (ref 26.0–34.0)
MCHC: 30.6 g/dL (ref 30.0–36.0)
MCV: 85.9 fL (ref 80.0–100.0)
Monocytes Absolute: 1.1 10*3/uL — ABNORMAL HIGH (ref 0.1–1.0)
Monocytes Relative: 7 %
Neutro Abs: 12.8 10*3/uL — ABNORMAL HIGH (ref 1.7–7.7)
Neutrophils Relative %: 87 %
Platelets: 340 10*3/uL (ref 150–400)
RBC: 4.18 MIL/uL (ref 3.87–5.11)
RDW: 17 % — ABNORMAL HIGH (ref 11.5–15.5)
WBC: 14.7 10*3/uL — ABNORMAL HIGH (ref 4.0–10.5)
nRBC: 0 % (ref 0.0–0.2)

## 2023-04-11 LAB — BASIC METABOLIC PANEL
Anion gap: 9 (ref 5–15)
BUN: 20 mg/dL (ref 8–23)
CO2: 24 mmol/L (ref 22–32)
Calcium: 10 mg/dL (ref 8.9–10.3)
Chloride: 104 mmol/L (ref 98–111)
Creatinine, Ser: 0.82 mg/dL (ref 0.44–1.00)
GFR, Estimated: >60 mL/min (ref >60)
Glucose, Bld: 107 mg/dL — ABNORMAL HIGH (ref 70–99)
Potassium: 4.1 mmol/L (ref 3.5–5.1)
Sodium: 137 mmol/L (ref 135–145)

## 2023-04-11 MED ORDER — MORPHINE SULFATE (PF) 4 MG/ML IV SOLN
4.0000 mg | Freq: Once | INTRAVENOUS | Status: AC
  Administered 2023-04-11: 4 mg via INTRAVENOUS
  Filled 2023-04-11: qty 1

## 2023-04-11 MED ORDER — FENTANYL CITRATE PF 50 MCG/ML IJ SOSY
50.0000 ug | PREFILLED_SYRINGE | Freq: Once | INTRAMUSCULAR | Status: AC
  Administered 2023-04-11: 50 ug via INTRAVENOUS
  Filled 2023-04-11: qty 1

## 2023-04-11 MED ORDER — ONDANSETRON HCL 4 MG/2ML IJ SOLN
4.0000 mg | Freq: Once | INTRAMUSCULAR | Status: AC
  Administered 2023-04-11: 4 mg via INTRAVENOUS
  Filled 2023-04-11: qty 2

## 2023-04-11 NOTE — ED Notes (Signed)
[facility] at [facility] will send transport for [facility] ED to [facility] ED after 9a. Dr Violet Baldy at [facility] is accepting.-ABB(NS)

## 2023-04-11 NOTE — ED Provider Notes (Signed)
Patient signed out to me at 07 100 by Dr. Wilkie Aye pending callback from [facility] for possible transfer/OR.  In short this is an 89 year old female with a past medical history of COPD, CHF and hypertension that presented to the emergency department with left hip pain after mechanical fall.  She has had prior bilateral hip replacements.  The patient's workup did show a periprosthetic fracture in the left hip.  The patient and no other traumatic injuries.  We spoke with [facility] who had done her prior procedures but they were unable to accept the patient for admission at this time as there would be a delay to the OR due to lack of CRNA/specialist available and recommended transfer to a different facility to help facilitate a quicker disposition to the OR time.  Family requested [facility] is first possible transfer site who have been paged.  On my evaluation, the patient reports that her pain is improved but still significant pain with any type of movement of her leg.  She is neurovascularly intact.  Clinical Course as of 04/11/23 0818  Wed Apr 11, 2023  1610 Requested orthopedics be repaged.  I have not received a call back. [CH]  [phone] Spoke with Dr. Odis Hollingshead, [facility].  He has reviewed the radiology films.  Feels that this case is likely going to be complicated given periprosthetic nature of the fracture.  There is limited OR time and no availability of hip specialists to perform the surgery which would likely delay care/repair significantly for patient with risk of perioperative complication such as DVT or pain.  Recommends transfer for revision arthroplasty to tertiary center. [CH]  [phone] I spoke Dr. Violet Baldy with orthopedics at [facility] who will review the images and call back if they are able to accept the patient. [VK]  [id number] Patient was accepted for transfer to [facility] by Dr. Violet Baldy. Recommended ED to ED transport to eval in person to determine which level of care she might  need, plan for likely OR tomorrow. Patient and family are agreeable for transfer. [VK]    Clinical Course User Index [CH] Horton, Mayer Masker, MD [VK] Rexford Maus, DO      Rexford Maus, [place] 04/11/23 [phone]

## 2023-04-11 NOTE — ED Provider Notes (Addendum)
EMERGENCY DEPARTMENT AT [facility] Provider Note   CSN: [id number] Arrival date & time: 04/11/23  4782     History  Chief Complaint  Patient presents with   Marletta Lor    [name] is a 89 y.o. female.  HPI     This is an 89 year old female who presents following a fall.  Patient comes from [place].  Reports that she got up to go the bathroom this morning and fell in her bathroom.  Denies hitting her head or loss of consciousness.  Reports mechanical fall.  States that she feels like her legs just gave way.  She is complaining of left hip pain.  Prior hip replacements by Dr. Darrelyn Hillock bilaterally.  Home Medications Prior to Admission medications   Medication Sig Start Date End Date Taking? Authorizing Provider  alendronate (FOSAMAX) 70 MG tablet Take 70 mg by mouth once a week. 10/14/20   [provider]  aspirin EC 81 MG EC tablet Take 1 tablet (81 mg total) by mouth at bedtime. Swallow whole. 12/27/20   Pahwani, Kasandra Knudsen, MD  docusate sodium (COLACE) 100 MG capsule Take 100 mg by mouth daily as needed. 03/07/13   Bradly Bienenstock, MD  hydrALAZINE (APRESOLINE) 25 MG tablet Take 1 tablet (25 mg total) by mouth 3 (three) times daily. Or every 8 hours 06/10/21   Little Ishikawa, MD  isosorbide mononitrate (IMDUR) 30 MG 24 hr tablet Take 1 tablet (30 mg total) by mouth daily. 02/08/21   Little Ishikawa, MD  losartan (COZAAR) 100 MG tablet Take 1 tablet (100 mg total) by mouth daily. 11/03/21 05/16/22  Little Ishikawa, MD  lovastatin (MEVACOR) 40 MG tablet Take 1 tablet by mouth daily.    [provider]  memantine (NAMENDA) 5 MG tablet Take half tablet nightly  for 1 week then increase to  1 tablet every night 10/19/21   Marcos Eke, PA-C  metoprolol succinate (TOPROL-XL) 50 MG 24 hr tablet Take 1 tablet (50 mg total) by mouth daily. Take with or immediately following a meal. 02/24/21   Little Ishikawa, MD  Multiple  Vitamins-Minerals (PRESERVISION AREDS 2) CAPS Take 1 capsule by mouth 2 (two) times daily. 05/17/21   [provider]  spironolactone (ALDACTONE) 25 MG tablet Take 1 tablet (25 mg total) by mouth daily. 08/30/21   Little Ishikawa, MD  venlafaxine XR (EFFEXOR-XR) 150 MG 24 hr capsule Take 150 mg by mouth daily. 06/13/21   [provider]  vitamin B-12 100 MCG tablet Take 1 tablet (100 mcg total) by mouth daily. 12/28/20   Pahwani, Kasandra Knudsen, MD      Allergies    Other and Prolia [denosumab]    Review of Systems   Review of Systems  Musculoskeletal:        Left hip pain  All other systems reviewed and are negative.   Physical Exam Updated Vital Signs BP (!) 149/69   Pulse (!) 57   Temp (!) 97.5 F (36.4 C) (Oral)   Resp 18   Ht 1.499 m (4\' 11" )   Wt 59 kg   SpO2 96%   BMI 26.26 kg/m  Physical Exam Vitals and nursing note reviewed.  Constitutional:      Appearance: She is well-developed. She is not ill-appearing.     Comments: Elderly, nontoxic-appearing  HENT:     Head: Normocephalic and atraumatic.  Eyes:     Pupils: Pupils are equal, round, and reactive to light.  Cardiovascular:     Rate and Rhythm: Normal rate and regular rhythm.     Heart sounds: Normal heart sounds.  Pulmonary:     Effort: Pulmonary effort is normal. No respiratory distress.     Breath sounds: No wheezing.  Musculoskeletal:     Cervical back: Neck supple.     Comments: Left lower extremity appears foreshortened and rotated, tenderness to palpation left hip  Skin:    General: Skin is warm and dry.  Neurological:     Mental Status: She is alert and oriented to person, place, and time.  Psychiatric:        Mood and Affect: Mood normal.     ED Results / Procedures / Treatments   Labs (all labs ordered are listed, but only abnormal results are displayed) Labs Reviewed  CBC WITH DIFFERENTIAL/PLATELET - Abnormal; Notable for the following components:      Result Value   WBC  14.7 (*)    Hemoglobin 11.0 (*)    HCT 35.9 (*)    RDW 17.0 (*)    Neutro Abs 12.8 (*)    Lymphs Abs 0.6 (*)    Monocytes Absolute 1.1 (*)    Abs Immature Granulocytes 0.12 (*)    All other components within normal limits  BASIC METABOLIC PANEL - Abnormal; Notable for the following components:   Glucose, Bld 107 (*)    All other components within normal limits  URINALYSIS, ROUTINE W REFLEX MICROSCOPIC    EKG EKG Interpretation  Date/Time:  Wednesday April 11 2023 05:55:43 EDT Ventricular Rate:  58 PR Interval:  204 QRS Duration: 115 QT Interval:  459 QTC Calculation: 451 R Axis:   -47 Text Interpretation: Sinus rhythm Ventricular premature complex Left atrial enlargement Incomplete RBBB and LAFB Left ventricular hypertrophy Anterior Q waves, possibly due to LVH Confirmed by Ross Marcus ([id number]) on 04/11/2023 6:07:12 AM  Radiology DG Chest Portable 1 View  Result Date: 04/11/2023 CLINICAL DATA:  Fall with femur fracture. EXAM: PORTABLE CHEST 1 VIEW COMPARISON:  12/16/2020 FINDINGS: The cardio pericardial silhouette is enlarged. The lungs are clear without focal pneumonia, edema, pneumothorax or pleural effusion. Interstitial markings are diffusely coarsened with chronic features. Bones are diffusely demineralized. Telemetry leads overlie the chest. IMPRESSION: Chronic interstitial coarsening without acute cardiopulmonary findings. Electronically Signed   By: Kennith Center M.D.   On: 04/11/2023 06:34   DG Femur Portable Min 2 Views Left  Result Date: 04/11/2023 CLINICAL DATA:  Periprosthetic left femur fracture EXAM: LEFT FEMUR PORTABLE 2 VIEWS COMPARISON:  Hip films from earlier same day FINDINGS: Two views study shows an oblique a fracture of the mid femur at the tip of the femoral prosthesis. Fracture fragments are distracted by 4-5 mm with some probable trace bony over riding. Bones are diffusely demineralized. IMPRESSION: Oblique fracture of the mid femur at the tip of the  femoral prosthesis. Electronically Signed   By: Kennith Center M.D.   On: 04/11/2023 06:33   DG Hip Unilat W or Wo Pelvis 2-3 Views Left  Result Date: 04/11/2023 CLINICAL DATA:  Fall. EXAM: DG HIP (WITH OR WITHOUT PELVIS) 2-3V LEFT COMPARISON:  CT pelvis 01/22/2028 FINDINGS: Bones are diffusely demineralized. Patient is status post bilateral hip replacement. No evidence for an acute fracture in the pubic rami. No discernible sacral fracture. SI joints unremarkable. Degenerative changes again noted at the symphysis pubis. Femoral component of the right hip replacement has been incompletely visualized. Subluxation of the head of the femoral component relative to  the acetabular cup appear stable compared to the prior CT. Although not well visualized due to over penetration on the study, periprosthetic left femur fracture identified at the tip of the femoral component. IMPRESSION: 1. Periprosthetic left femur fracture at the tip of the femoral component. Dedicated left femur films recommended for more definitive assessment. Electronically Signed   By: Kennith Center M.D.   On: 04/11/2023 05:37    Procedures Procedures    Medications Ordered in ED Medications  fentaNYL (SUBLIMAZE) injection 50 mcg (50 mcg Intravenous Given 04/11/23 0513)  ondansetron (ZOFRAN) injection 4 mg (4 mg Intravenous Given 04/11/23 0514)    ED Course/ Medical Decision Making/ A&P Clinical Course as of 04/11/23 0643  Wed Apr 11, 2023  0625 Requested orthopedics be repaged.  I have not received a call back. [CH]  [phone] Spoke with Dr. Odis Hollingshead, [facility].  He has reviewed the radiology films.  Feels that this case is likely going to be complicated given periprosthetic nature of the fracture.  There is limited OR time and no availability of hip specialists to perform the surgery which would likely delay care/repair significantly for patient with risk of perioperative complication such as DVT or pain.  Recommends transfer for  revision arthroplasty to tertiary center. [CH]    Clinical Course User Index [CH] Sindhu Nguyen, Mayer Masker, MD                             Medical Decision Making Amount and/or Complexity of Data Reviewed Labs: ordered. Radiology: ordered.  Risk Prescription drug management. Decision regarding hospitalization.   This patient presents to the ED for concern of left hip pain, this involves an extensive number of treatment options, and is a complaint that carries with it a high risk of complications and morbidity.  I considered the following differential and admission for this acute, potentially life threatening condition.  The differential diagnosis includes fracture, dislocation, contusion  MDM:    This is an 89 year old female who presents following a fall.  She is complaining of left hip pain.  She is nontoxic and vital signs are reassuring.  Given clinical exam, highly suspect fracture or dislocation.  X-rays independently reviewed by myself.  Appears that she has a periprosthetic hip fracture on the left.  Patient was given pain and nausea medication.  Family at bedside indicates they prefer to stay with [facility].  Consult placed.  Basic lab work and EKG also ordered.  (Labs, imaging, consults)  Labs: I Ordered, and personally interpreted labs.  The pertinent results include: Pending  Imaging Studies ordered: I ordered imaging studies including x-ray left hip I independently visualized and interpreted imaging. I agree with the radiologist interpretation  Additional history obtained from daughters at bedside.  External records from outside source obtained and reviewed including prior evaluations  Cardiac Monitoring: The patient was maintained on a cardiac monitor.  If on the cardiac monitor, I personally viewed and interpreted the cardiac monitored which showed an underlying rhythm of: Sinus rhythm  Reevaluation: After the interventions noted above, I reevaluated the patient and  found that they have :stayed the same  Social Determinants of Health:  lives in independent living  Disposition: Admit  Co morbidities that complicate the patient evaluation  Past Medical History:  Diagnosis Date   Acute combined systolic and diastolic CHF, NYHA class 4 (HCC)    Anxiety    Arthritis    knees, hands    Cancer (HCC)  SQUAMOuS CELL CARCINOMA OF SKIN- PERIANAL    COPD (chronic obstructive pulmonary disease) (HCC)    smoker   Depression    Elbow fracture, right 2013   Hyperlipidemia    Hypertension    Osteoporosis    Pneumonia    hx of    S/P radiation therapy 04/08/2014-05/14/2014   Right perianal skin / 45 Gy in 25 fractions   Wears glasses      Medicines Meds ordered this encounter  Medications   fentaNYL (SUBLIMAZE) injection 50 mcg   ondansetron (ZOFRAN) injection 4 mg    I have reviewed the patients home medicines and have made adjustments as needed  Problem List / ED Course: Problem List Items Addressed This Visit   None Visit Diagnoses     Periprosthetic hip fracture, initial encounter    -  Primary                   Final Clinical Impression(s) / ED Diagnoses Final diagnoses:  Periprosthetic hip fracture, initial encounter    Rx / DC Orders ED Discharge Orders     None         Sung Renton, Mayer Masker, MD 04/11/23 1610    Shon Baton, MD 04/11/23 9604    Shon Baton, MD 04/11/23 [phone]

## 2023-04-11 NOTE — ED Notes (Signed)
XR bedside.

## 2023-04-11 NOTE — ED Triage Notes (Addendum)
POV from [facility], A&O x 4, GCS 15, BIB wheelchair  Pt was up to use the bathroom and stumbled, falling onto left hip, hx of bilateral hip replacements. Left leg shortened and externally rotated.

## 2023-04-11 NOTE — ED Notes (Signed)
2L [place] applied with IV pain meds due to O2 saturations 89-91% on RA prior to administration. Hx of COPD without O2 requirements.

## 2023-04-23 DEATH — deceased

## 2023-04-24 ENCOUNTER — Telehealth: Payer: Medicare Other | Admitting: Physician Assistant

## 2023-04-24 NOTE — Progress Notes (Signed)
Spoke with the patient's daughter today. Patient is deceased.She sustained a fall with L hip fracture on 04/11/23 and sustaining complications after surgery. It has been a privilege caring for this nice patient.

## 2023-09-18 ENCOUNTER — Ambulatory Visit: Payer: Medicare Other | Admitting: Cardiology
